# Patient Record
Sex: Female | Born: 1960 | Race: White | Hispanic: No | Marital: Married | Smoking: Former smoker
Health system: Southern US, Community
[De-identification: ages and names within clinical notes are randomized; demographics above are authoritative.]

## PROBLEM LIST (undated history)

## (undated) DIAGNOSIS — E119 Type 2 diabetes mellitus without complications: Secondary | ICD-10-CM

## (undated) DIAGNOSIS — E7800 Pure hypercholesterolemia, unspecified: Secondary | ICD-10-CM

## (undated) DIAGNOSIS — K219 Gastro-esophageal reflux disease without esophagitis: Secondary | ICD-10-CM

## (undated) DIAGNOSIS — Z83719 Family history of colon polyps, unspecified: Secondary | ICD-10-CM

## (undated) DIAGNOSIS — N183 Chronic kidney disease, stage 3 (moderate): Secondary | ICD-10-CM

## (undated) DIAGNOSIS — I10 Essential (primary) hypertension: Secondary | ICD-10-CM

## (undated) HISTORY — DX: Gastro-esophageal reflux disease without esophagitis: K21.9

## (undated) HISTORY — DX: Essential (primary) hypertension: I10

## (undated) HISTORY — DX: Type 2 diabetes mellitus without complications: E11.9

## (undated) HISTORY — PX: APPENDECTOMY: SHX54

## (undated) HISTORY — DX: Chronic kidney disease, stage 3 (moderate): N18.3

## (undated) HISTORY — DX: Family history of colon polyps, unspecified: Z83.719

## (undated) MED FILL — Medication: Fill #0 | Status: CN

---

## 2004-05-29 ENCOUNTER — Encounter: Payer: Self-pay | Admitting: Specialist

## 2005-03-15 ENCOUNTER — Other Ambulatory Visit: Payer: Self-pay

## 2005-03-16 ENCOUNTER — Inpatient Hospital Stay: Payer: Self-pay | Admitting: Internal Medicine

## 2005-04-07 ENCOUNTER — Ambulatory Visit: Payer: Self-pay | Admitting: Internal Medicine

## 2006-04-19 ENCOUNTER — Emergency Department: Payer: Self-pay | Admitting: Unknown Physician Specialty

## 2006-05-22 ENCOUNTER — Other Ambulatory Visit: Payer: Self-pay

## 2006-05-23 ENCOUNTER — Observation Stay: Payer: Self-pay | Admitting: Internal Medicine

## 2006-10-08 ENCOUNTER — Emergency Department: Payer: Self-pay | Admitting: Emergency Medicine

## 2007-04-18 ENCOUNTER — Emergency Department: Payer: Self-pay | Admitting: Emergency Medicine

## 2007-09-25 ENCOUNTER — Observation Stay: Payer: Self-pay | Admitting: Internal Medicine

## 2007-09-25 ENCOUNTER — Other Ambulatory Visit: Payer: Self-pay

## 2007-09-26 ENCOUNTER — Other Ambulatory Visit: Payer: Self-pay

## 2008-04-11 ENCOUNTER — Ambulatory Visit: Payer: Self-pay

## 2008-10-20 ENCOUNTER — Ambulatory Visit: Payer: Self-pay

## 2010-07-09 ENCOUNTER — Emergency Department: Payer: Self-pay | Admitting: Emergency Medicine

## 2013-08-15 LAB — HM DIABETES EYE EXAM

## 2013-09-13 ENCOUNTER — Encounter: Payer: Self-pay | Admitting: Family Medicine

## 2013-09-29 ENCOUNTER — Encounter: Payer: Self-pay | Admitting: Family Medicine

## 2014-03-02 ENCOUNTER — Observation Stay: Payer: Self-pay | Admitting: Internal Medicine

## 2014-03-02 LAB — COMPREHENSIVE METABOLIC PANEL
ALK PHOS: 68 U/L
AST: 17 U/L (ref 15–37)
Albumin: 3.7 g/dL (ref 3.4–5.0)
Anion Gap: 9 (ref 7–16)
BUN: 16 mg/dL (ref 7–18)
Bilirubin,Total: 0.3 mg/dL (ref 0.2–1.0)
CALCIUM: 9.5 mg/dL (ref 8.5–10.1)
CHLORIDE: 105 mmol/L (ref 98–107)
CREATININE: 0.98 mg/dL (ref 0.60–1.30)
Co2: 23 mmol/L (ref 21–32)
EGFR (African American): >60
GLUCOSE: 192 mg/dL — AB (ref 65–99)
Osmolality: 280 (ref 275–301)
POTASSIUM: 3.7 mmol/L (ref 3.5–5.1)
SGPT (ALT): 35 U/L (ref 12–78)
Sodium: 137 mmol/L (ref 136–145)
Total Protein: 7.6 g/dL (ref 6.4–8.2)

## 2014-03-02 LAB — APTT: Activated PTT: 26.8 secs (ref 23.6–35.9)

## 2014-03-02 LAB — PROTIME-INR
INR: 1
Prothrombin Time: 13.1 secs (ref 11.5–14.7)

## 2014-03-02 LAB — CBC
HCT: 34.8 % — AB (ref 35.0–47.0)
HGB: 11.6 g/dL — AB (ref 12.0–16.0)
MCH: 26.3 pg (ref 26.0–34.0)
MCHC: 33.3 g/dL (ref 32.0–36.0)
MCV: 79 fL — ABNORMAL LOW (ref 80–100)
Platelet: 266 10*3/uL (ref 150–440)
RBC: 4.41 10*6/uL (ref 3.80–5.20)
RDW: 17.6 % — AB (ref 11.5–14.5)
WBC: 9.5 10*3/uL (ref 3.6–11.0)

## 2014-03-02 LAB — TROPONIN I: Troponin-I: <0.02 ng/mL

## 2014-03-02 LAB — CK TOTAL AND CKMB (NOT AT ARMC)
CK, Total: 50 U/L
CK-MB: 1.3 ng/mL (ref 0.5–3.6)

## 2014-03-02 LAB — D-DIMER(ARMC): D-Dimer: 313 ng/ml

## 2014-03-03 LAB — HCG, QUANTITATIVE, PREGNANCY: BETA HCG, QUANT.: 2 m[IU]/mL

## 2014-03-03 LAB — CK TOTAL AND CKMB (NOT AT ARMC)
CK, Total: 40 U/L
CK, Total: 36 U/L
CK-MB: 1.1 ng/mL (ref 0.5–3.6)
CK-MB: 1.4 ng/mL (ref 0.5–3.6)

## 2014-03-03 LAB — LIPID PANEL
CHOLESTEROL: 176 mg/dL (ref 0–200)
HDL Cholesterol: 46 mg/dL (ref 40–60)
LDL CHOLESTEROL, CALC: 100 mg/dL (ref 0–100)
Triglycerides: 151 mg/dL (ref 0–200)
VLDL CHOLESTEROL, CALC: 30 mg/dL (ref 5–40)

## 2014-03-03 LAB — TROPONIN I

## 2014-06-16 ENCOUNTER — Ambulatory Visit: Payer: Self-pay | Admitting: Nurse Practitioner

## 2014-12-13 ENCOUNTER — Emergency Department
Admit: 2014-12-13 | Disposition: A | Discharge status: Home / Self Care | Payer: Self-pay | Admitting: Emergency Medicine

## 2014-12-20 NOTE — H&P (Signed)
PATIENT NAME:  Renee Bush, [name] MR#:  [id number] DATE OF BIRTH:  15-Feb-1961  DATE OF ADMISSION:  03/02/2014  REFERRING PHYSICIAN: Dr. Mayford KnifeWilliams. PRIMARY CARE PHYSICIAN: [facility].   CHIEF COMPLAINT: Chest pain.   HISTORY OF PRESENT ILLNESS: A 54 year old Caucasian female with a past medical history of diabetes type 2, hypertension, hyperlipidemia, gastroesophageal reflux disease, who is presenting with chest pain. She describes the chest pain of acute onset which occurred at rest, retrosternal in location, nonradiating, pressure in quality, 4/10 in intensity, associated with nausea. Denies any vomiting, shortness of breath or further symptomatology. At baseline, she denies any anginal symptoms, no worsening factors for her pain. She received 1 nitroglycerin and has relief of symptoms. EKG and cardiac enzymes are within normal limits in the Emergency Department. Currently no further complaints.   REVIEW OF SYSTEMS:  CONSTITUTIONAL: Denies fever, fatigue, weakness, pain.  EYES: Denies blurred vision, double vision, eye pain.  EARS, NOSE, THROAT: Denies tinnitus, ear pain, hearing loss.  RESPIRATORY: Denies cough, wheeze, shortness of breath.  CARDIOVASCULAR: Positive for chest pain as described above. Denies any orthopnea, edema, palpitations.  GASTROINTESTINAL: Positive for nausea. Denies any vomiting, diarrhea, abdominal pain.  GENITOURINARY: Denies dysuria, hematuria.  ENDOCRINE: Denies nocturia or thyroid problems.  HEMATOLOGIC AND LYMPHATIC: Denies easy bruising or bleeding.  SKIN: Denies rashes or lesions.  MUSCULOSKELETAL: Denies pain in neck, back, shoulder, knees, hips or arthritic symptoms.  NEUROLOGIC: Denies paralysis, paresthesias.  PSYCHIATRIC: Denies anxiety or depressive symptoms.  Otherwise, full review of systems performed by me is negative.   PAST MEDICAL HISTORY: Gastroesophageal reflux disease, hypertension, diabetes type 2, hyperlipidemia.   SOCIAL HISTORY:  Remote tobacco use. Occasional alcohol use. Denies any drug usage.   FAMILY HISTORY: Positive for coronary artery disease as well as diabetes in multiple family members.   ALLERGIES: PENICILLIN.   HOME MEDICATIONS: Include: Norco 5/325 mg p.o. q. 4 hours as needed for pain, Ambien 5 mg p.o. at bedtime for sleep, Colace 100 mg p.o. b.i.d., glipizide 5 mg p.o. b.i.d., lisinopril/hydrochlorothiazide 20/12.5 mg p.o. daily, lovastatin 40 mg p.o. daily, milk of magnesia 30 mL p.o. daily for constipation, Mylanta 15 mL p.o. q. 6 hours as needed for indigestion, Nitrostat 0.4 mg sublingual every 5 minutes as needed for chest pain, Protonix 40 mg p.o. daily, Toprol-XL 25 mg p.o. daily, Xanax 0.25 mg p.o. q. 8 hours as needed for anxiety.   PHYSICAL EXAMINATION: VITAL SIGNS: Temperature 98.4, heart rate 93, respirations 20, blood pressure 161/93, saturating at 100% on room air. Weight 77.1 kg, BMI of 31.1.  GENERAL: Well-nourished, well-developed, Caucasian female, currently in no acute distress.  HEENT: Head: Normocephalic, atraumatic. Eyes: Pupils equal, round and reactive to light. Extraocular muscles intact. No scleral icterus. Mouth: Moist mucosal membrane. Dentition intact. No abscess noted. ENT: Clear without exudates. No external lesions.  NECK: Supple. No thyromegaly. No nodules. No JVD.  PULMONARY: Clear to auscultation bilaterally without wheezes, rubs or rhonchi. No use of accessory muscles. Good respiratory effort.  CHEST: Nontender to palpation.  CARDIOVASCULAR: S1, S2, regular rate and rhythm. No murmurs, rubs, or gallops. No edema. Pedal pulses 2+ bilaterally.  GASTROINTESTINAL: Soft, nontender, nondistended. No masses. Positive bowel sounds. No hepatosplenomegaly.  MUSCULOSKELETAL: No swelling, clubbing, or edema. Range of motion full in all extremities.  NEUROLOGIC: Cranial nerves II through XII intact. No gross focal neurologic deficits. Sensation intact. Reflexes intact.  SKIN: No  ulcerations, lesions, rashes, or cyanosis. Skin warm, dry. Turgor intact.  PSYCHIATRIC: Mood and affect  within normal limits. The patient is awake, alert, oriented x3. Insight and judgment intact.   LABORATORY DATA: EKG performed revealing sinus tachycardia, heart rate in the 110s. No ST or T-wave abnormalities. Chest x-ray performed: No acute cardiopulmonary process. Remainder of laboratory data: Sodium 137, potassium 3.7, chloride 105, bicarbonate of 23, BUN 16, creatinine 0.98, glucose 192, LFTs within normal limits. Troponin-I less than 0.02, CK-MB 1.3, CK 50. WBC 9.5, hemoglobin 11.6, platelets of 266,000.   ASSESSMENT AND PLAN: A 55 year old Caucasian female with past medical history of diabetes, hypertension, hyperlipidemia, presenting with chest pain.  1. Chest pain, which occurred at rest. Place on telemetry. Trend cardiac enzymes x 3. Initiate aspirin and statin therapy.  2. Diabetes type 2. Hold p.o. agents. Add insulin sliding scale with Accu-Cheks before meals and at nighttime.  3. Hypertension. Continue hydrochlorothiazide/lisinopril as well as Toprol.  4. Gastroesophageal reflux disease. Continue with proton pump inhibitor therapy.  5. Venous thromboembolism prophylaxis with heparin subcutaneous.   CODE STATUS: The patient is FULL CODE.   TIME SPENT: 45 minutes.    ____________________________ [name]. Hower, MD dkh:lm D: 03/02/2014 23:33:59 ET T: 03/03/2014 01:15:37 ET JOB#: [id number]  cc: [name]. Hower, MD, <Dictator> DAVID Synetta Shadow MD ELECTRONICALLY SIGNED 03/04/2014 1:49

## 2014-12-20 NOTE — Consult Note (Signed)
PATIENT NAME:  Renee Bush, Renee Bush MR#:  [id number] DATE OF BIRTH:  1961/04/05  DATE OF CONSULTATION:  03/03/2014  REFERRING PHYSICIAN:  Dr. Clint GuyHower CONSULTING PHYSICIAN:  Marcina MillardAlexander Demondre Aguas, MD  CHIEF COMPLAINT: Chest pain.   HISTORY OF PRESENT ILLNESS: The patient is a 54 year old female with history of hypertension, hyperlipidemia and diabetes who was admitted on 03/02/2014 with chest pain. The patient reports that she was in her usual state of health until prior to admission she experienced substernal chest discomfort which was rated 8/10, which was pressure-like in quality without radiation, nausea, vomiting or diaphoresis. The patient presented to [facility] Emergency Room where EKG was nondiagnostic. The patient was admitted to telemetry where she has ruled out for myocardial infarction by CPK, isoenzymes and troponin. The patient has had some mild residual chest discomfort which is rated more of 1 to 2/10.   PAST MEDICAL HISTORY: 1.  Hypertension. 2.  Diabetes.  3.  Hyperlipidemia.  4.  Gastroesophageal reflux disease.   MEDICATIONS: Lisinopril/HCTZ 20/12.5 daily, lovastatin 40 mg daily, Toprol-XL 25 mg daily, Norco 5/325 q. 4 hours p.r.n., Ambien 5 mg at bedtime p.r.n., Colace 100 mg b.i.d., glipizide 5 mg b.i.d., milk of magnesia 30 mL daily, Mylanta 15 mL q. 6 p.r.n., Nitrostat p.r.n., Protonix 20 mg daily.   SOCIAL HISTORY: The patient denies tobacco abuse.   FAMILY HISTORY: Positive for coronary artery disease.   REVIEW OF SYSTEMS:  CONSTITUTIONAL: No fever or chills.  EYES: No blurry vision.  EARS: No hearing loss.  RESPIRATORY: No shortness of breath.  CARDIOVASCULAR: Chest discomfort as described above.  GASTROINTESTINAL: No nausea, vomiting, or diarrhea.  GENITOURINARY: No dysuria or hematuria.  ENDOCRINE: No polyuria or polydipsia.  MUSCULOSKELETAL: No arthralgias or myalgias.  NEUROLOGICAL: No focal muscle weakness or numbness.  PSYCHOLOGICAL: No depression or anxiety.    PHYSICAL EXAMINATION: VITAL SIGNS: Blood pressure 121/76, pulse 88, respirations 16, temperature 97.6, pulse ox 94%.  HEENT: Pupils equal and reactive to light and accommodation.  NECK: Supple without thyromegaly.  LUNGS: Clear.  HEART: Normal JVP. Normal PMI. Regular rate and rhythm. Normal S1, S2. No appreciable gallop, murmur, or rub.  ABDOMEN: Soft and nontender. Pulses were intact bilaterally.  MUSCULOSKELETAL: Normal muscle tone.  NEUROLOGIC: The patient is alert and oriented x3. Motor and sensory are both grossly intact.   IMPRESSION: A 54 year old female with multiple cardiovascular risk factors who presents with chest pain, has ruled out for myocardial infarction by CPK, isoenzymes and troponin. EKG is nondiagnostic.   RECOMMENDATIONS: 1.  Agree with overall current therapy.  2.  Would defer full dose anticoagulation.  3.  Would proceed with functional study with ETT Myoview.  4.  Further recommendations pending functional study result.  ____________________________ Marcina MillardAlexander Calisa Luckenbaugh, MD ap:sb D: 03/03/2014 13:28:50 ET T: 03/03/2014 14:53:34 ET JOB#: [id number]  cc: Marcina MillardAlexander Karolee Meloni, MD, <Dictator> Marcina MillardALEXANDER Nailyn Dearinger MD ELECTRONICALLY SIGNED 04/08/2014 13:53

## 2014-12-20 NOTE — Discharge Summary (Signed)
PATIENT NAME:  Renee Bush, [name] MR#:  [id number] DATE OF BIRTH:  1961-07-12  DATE OF ADMISSION:  03/02/2014 DATE OF DISCHARGE:  03/04/2014  ADMITTING PHYSICIAN: Angelica Ranavid Hower, M.D.  DISCHARGING PHYSICIAN: Enid Baasadhika Nickalas Mccarrick, M.D.  PRIMARY CARE PHYSICIAN: [facility].  CONSULTATIONS IN THE HOSPITAL: Cardiology consultation by Dr. Darrold JunkerParaschos.   DISCHARGE DIAGNOSES:  1.  Costochondritis and musculoskeletal chest pain.  2.  Diabetes mellitus.  3.  Hypertension.  4.  Peripheral neuropathy.  5.  Hyperlipidemia.  6.  Gastroesophageal reflux disease.   DISCHARGE HOME MEDICATIONS: 1.  Lisinopril/ hydrochlorothiazide 20/12.5 mg 1 tablet p.o. daily.  2.  Lovastatin 40 mg p.o. daily.  3.  Protonix 40 mg p.o. daily.  4.  Toprol 25 mg p.o. daily.  5.  Ambien 5 mg p.o. at bedtime.  6.  Colace 100 mg p.o. b.i.d.  7.  Sublingual nitroglycerin every 5 minutes p.r.n. for chest drainage.  8.  Xanax 0.25 mg q. 8 hours p.r.n.  9.  Glipizide 5 mg p.o. b.i.d.  10.  Mylanta 15 mL p.o. q. 6 hours p.r.n.  11.  Milk of magnesia 30 mL daily p.r.n. for constipation.  12.  Gabapentin 100 mg p.o. t.i.d.  13.  Norco 5/325 mg 1 tablet q. 6 hours p.r.n. for pain.   DISCHARGE DIET: Low-sodium diet.   DISCHARGE ACTIVITY: As tolerated.    FOLLOWUP INSTRUCTIONS: PCP follow-up in 1-2 weeks.   LABORATORIES AND IMAGING STUDIES PRIOR TO DISCHARGE: Myocardial scan showing no significant wall motion abnormality.  Pharmacological myocardial perfusion study with no significant ischemia, EF is 74%. No EKG changes concerning for ischemia.   LDL cholesterol 100, HDL 46, total cholesterol 811176, triglycerides 151.  Troponins remain negative in the hospital.   Chest x-ray on admission revealing clear lung fields. No acute cardiopulmonary disease.   WBC 9.5, hemoglobin 9.6, hematocrit 34.8, platelet count 266,000.   Sodium 137, potassium 3.7, chloride 105, bicarbonate 23, BUN 16, creatinine 0.98 glucose 192, and  calcium 99.5.   ALT 35, AST 17, alkaline phosphatase 68, total bilirubin 0.3, albumin of 3.7. D-dimer is 313.   BRIEF HOSPITAL COURSE: Renee Bush is a 54 year old Caucasian female with past medical history significant for hypertension, diabetes, and hyperlipidemia who presents to the hospital secondary to chest pain, which is retrosternal, non-radiating, and tender to touch.   1.  Chest pain, likely costochondritis and musculoskeletal.  Pain has improved during the hospital course, though it was not atypical chest pain because of her risk factors for diabetes, hypertension, and her being female gender, possibly atypical presentation of coronary artery disease is also possible. She was ruled out for myocardial infarction with her troponins being negative. However, cardiology was consulted and they recommended Myoview. Myoview came back negative with normal ejection fraction and no changes of ischemia. The patient is being discharged home. She was advised to take Norco or Motrin p.r.n. for her pain.   2.  Peripheral neuropathy.  New diagnosis. The patient has decreased sensation in both feet and pins and needle sensation going on for several weeks now as an outpatient. Being started on low-dose gabapentin at this time and can follow up with PCP as an outpatient.   3.  Diabetes mellitus. Continue home medication. She is on glipizide.  4.  Hypertension. Continue lisinopril/ hydrochlorothiazide and Toprol. Her course has been, otherwise, uneventful in the hospital.   DISCHARGE CONDITION: Stable.   DISCHARGE DISPOSITION: Home.   TIME SPENT ON DISCHARGE: 40 minutes.   ____________________________ Enid Baasadhika Olina Melfi,  MD rk:ts D: 03/04/2014 15:43:56 ET T: 03/04/2014 18:22:42 ET JOB#: [id number]  cc: Enid Baas, MD, <Dictator> [facility] Enid Baas MD ELECTRONICALLY SIGNED 03/06/2014 10:55

## 2015-01-22 ENCOUNTER — Ambulatory Visit: Payer: Self-pay

## 2015-02-12 ENCOUNTER — Ambulatory Visit: Payer: Self-pay | Admitting: Ophthalmology

## 2015-02-19 ENCOUNTER — Ambulatory Visit: Payer: Self-pay | Admitting: Ophthalmology

## 2015-02-19 ENCOUNTER — Ambulatory Visit: Payer: Self-pay

## 2015-02-24 ENCOUNTER — Other Ambulatory Visit: Payer: Self-pay

## 2015-02-26 ENCOUNTER — Ambulatory Visit: Payer: Self-pay

## 2015-02-26 ENCOUNTER — Other Ambulatory Visit: Payer: Self-pay

## 2015-02-26 ENCOUNTER — Ambulatory Visit: Payer: Self-pay | Admitting: Ophthalmology

## 2015-02-26 LAB — HM DIABETES EYE EXAM

## 2015-04-02 ENCOUNTER — Other Ambulatory Visit: Payer: Self-pay

## 2015-04-07 ENCOUNTER — Ambulatory Visit: Payer: Self-pay

## 2015-04-07 LAB — BASIC METABOLIC PANEL
BUN: 19 mg/dL (ref 4–21)
Creatinine: 0.9 mg/dL (ref 0.5–1.1)
Glucose: 163 mg/dL
Potassium: 4.3 mmol/L (ref 3.4–5.3)
SODIUM: 141 mmol/L (ref 137–147)

## 2015-04-07 LAB — CBC AND DIFFERENTIAL
HEMATOCRIT: 39 % (ref 36–46)
HEMOGLOBIN: 13.1 g/dL (ref 12.0–16.0)
Platelets: 243 10*3/uL (ref 150–399)
WBC: 6.8 10^3/mL

## 2015-04-07 LAB — HEPATIC FUNCTION PANEL
ALK PHOS: 58 U/L (ref 25–125)
ALT: 21 U/L (ref 7–35)
AST: 13 U/L (ref 13–35)
BILIRUBIN, TOTAL: 0.2 mg/dL

## 2015-04-08 ENCOUNTER — Ambulatory Visit
Admission: RE | Admit: 2015-04-08 | Discharge: 2015-04-08 | Disposition: A | Discharge status: Home / Self Care | Payer: PRIVATE HEALTH INSURANCE | Source: Ambulatory Visit | Attending: Family Medicine | Admitting: Family Medicine

## 2015-04-08 ENCOUNTER — Other Ambulatory Visit: Payer: Self-pay | Admitting: Family Medicine

## 2015-04-08 DIAGNOSIS — R0989 Other specified symptoms and signs involving the circulatory and respiratory systems: Secondary | ICD-10-CM | POA: Insufficient documentation

## 2015-04-08 DIAGNOSIS — R609 Edema, unspecified: Secondary | ICD-10-CM

## 2015-04-08 DIAGNOSIS — R600 Localized edema: Secondary | ICD-10-CM | POA: Insufficient documentation

## 2015-04-08 DIAGNOSIS — Z880 Allergy status to penicillin: Secondary | ICD-10-CM | POA: Insufficient documentation

## 2015-04-16 ENCOUNTER — Ambulatory Visit: Payer: Self-pay

## 2015-04-18 DIAGNOSIS — R5383 Other fatigue: Secondary | ICD-10-CM | POA: Insufficient documentation

## 2015-04-18 DIAGNOSIS — E119 Type 2 diabetes mellitus without complications: Secondary | ICD-10-CM

## 2015-04-18 DIAGNOSIS — F1010 Alcohol abuse, uncomplicated: Secondary | ICD-10-CM

## 2015-04-18 DIAGNOSIS — I10 Essential (primary) hypertension: Secondary | ICD-10-CM

## 2015-04-18 HISTORY — DX: Alcohol abuse, uncomplicated: F10.10

## 2015-05-07 ENCOUNTER — Other Ambulatory Visit: Payer: Self-pay

## 2015-05-14 ENCOUNTER — Other Ambulatory Visit: Payer: Self-pay

## 2015-05-21 ENCOUNTER — Ambulatory Visit: Payer: Self-pay

## 2015-06-04 ENCOUNTER — Emergency Department: Payer: No Typology Code available for payment source

## 2015-06-04 ENCOUNTER — Emergency Department
Admission: EM | Admit: 2015-06-04 | Discharge: 2015-06-05 | Disposition: A | Discharge status: Home / Self Care | Payer: No Typology Code available for payment source | Attending: Emergency Medicine | Admitting: Emergency Medicine

## 2015-06-04 ENCOUNTER — Other Ambulatory Visit: Payer: Self-pay

## 2015-06-04 ENCOUNTER — Encounter: Payer: Self-pay | Admitting: Emergency Medicine

## 2015-06-04 DIAGNOSIS — S60413A Abrasion of left middle finger, initial encounter: Secondary | ICD-10-CM | POA: Insufficient documentation

## 2015-06-04 DIAGNOSIS — Z880 Allergy status to penicillin: Secondary | ICD-10-CM | POA: Insufficient documentation

## 2015-06-04 DIAGNOSIS — S199XXA Unspecified injury of neck, initial encounter: Secondary | ICD-10-CM | POA: Diagnosis present

## 2015-06-04 DIAGNOSIS — S60222A Contusion of left hand, initial encounter: Secondary | ICD-10-CM

## 2015-06-04 DIAGNOSIS — T07XXXA Unspecified multiple injuries, initial encounter: Secondary | ICD-10-CM

## 2015-06-04 DIAGNOSIS — S60812A Abrasion of left wrist, initial encounter: Secondary | ICD-10-CM | POA: Diagnosis not present

## 2015-06-04 DIAGNOSIS — Y92410 Unspecified street and highway as the place of occurrence of the external cause: Secondary | ICD-10-CM | POA: Diagnosis not present

## 2015-06-04 DIAGNOSIS — Z79899 Other long term (current) drug therapy: Secondary | ICD-10-CM | POA: Insufficient documentation

## 2015-06-04 DIAGNOSIS — Z87891 Personal history of nicotine dependence: Secondary | ICD-10-CM | POA: Diagnosis not present

## 2015-06-04 DIAGNOSIS — S60212A Contusion of left wrist, initial encounter: Secondary | ICD-10-CM | POA: Diagnosis not present

## 2015-06-04 DIAGNOSIS — S161XXA Strain of muscle, fascia and tendon at neck level, initial encounter: Secondary | ICD-10-CM | POA: Diagnosis not present

## 2015-06-04 DIAGNOSIS — E119 Type 2 diabetes mellitus without complications: Secondary | ICD-10-CM | POA: Diagnosis not present

## 2015-06-04 DIAGNOSIS — Y9389 Activity, other specified: Secondary | ICD-10-CM | POA: Insufficient documentation

## 2015-06-04 DIAGNOSIS — I10 Essential (primary) hypertension: Secondary | ICD-10-CM | POA: Insufficient documentation

## 2015-06-04 DIAGNOSIS — Z23 Encounter for immunization: Secondary | ICD-10-CM | POA: Diagnosis not present

## 2015-06-04 DIAGNOSIS — Y998 Other external cause status: Secondary | ICD-10-CM | POA: Insufficient documentation

## 2015-06-04 HISTORY — DX: Pure hypercholesterolemia, unspecified: E78.00

## 2015-06-04 LAB — TYPE AND SCREEN
ABO/RH(D): A POS
Antibody Screen: NEGATIVE

## 2015-06-04 LAB — CBC
HCT: 37.2 % (ref 35.0–47.0)
Hemoglobin: 13 g/dL (ref 12.0–16.0)
MCH: 32.3 pg (ref 26.0–34.0)
MCHC: 35 g/dL (ref 32.0–36.0)
MCV: 92.5 fL (ref 80.0–100.0)
PLATELETS: 170 10*3/uL (ref 150–440)
RBC: 4.02 MIL/uL (ref 3.80–5.20)
RDW: 13.4 % (ref 11.5–14.5)
WBC: 7.5 10*3/uL (ref 3.6–11.0)

## 2015-06-04 LAB — BASIC METABOLIC PANEL
Anion gap: 8 (ref 5–15)
BUN: 22 mg/dL — AB (ref 6–20)
CALCIUM: 8.9 mg/dL (ref 8.9–10.3)
CO2: 29 mmol/L (ref 22–32)
CREATININE: 1.26 mg/dL — AB (ref 0.44–1.00)
Chloride: 102 mmol/L (ref 101–111)
GFR calc Af Amer: 55 mL/min — ABNORMAL LOW (ref >60)
GFR, EST NON AFRICAN AMERICAN: 47 mL/min — AB (ref >60)
Glucose, Bld: 199 mg/dL — ABNORMAL HIGH (ref 65–99)
Potassium: 4.1 mmol/L (ref 3.5–5.1)
SODIUM: 139 mmol/L (ref 135–145)

## 2015-06-04 LAB — PROTIME-INR
INR: 0.94
Prothrombin Time: 12.8 seconds (ref 11.4–15.0)

## 2015-06-04 LAB — APTT: APTT: 28 s (ref 24–36)

## 2015-06-04 MED ORDER — SODIUM CHLORIDE 0.9 % IV BOLUS (SEPSIS)
1000.0000 mL | Freq: Once | INTRAVENOUS | Status: AC
  Administered 2015-06-04: 1000 mL via INTRAVENOUS

## 2015-06-04 MED ORDER — FENTANYL CITRATE (PF) 100 MCG/2ML IJ SOLN
50.0000 ug | Freq: Once | INTRAMUSCULAR | Status: AC
  Administered 2015-06-04: 50 ug via INTRAVENOUS

## 2015-06-04 MED ORDER — PROMETHAZINE HCL 25 MG/ML IJ SOLN
INTRAMUSCULAR | Status: AC
  Administered 2015-06-04: 25 mg via INTRAVENOUS
  Filled 2015-06-04: qty 1

## 2015-06-04 MED ORDER — FENTANYL CITRATE (PF) 100 MCG/2ML IJ SOLN: 100.0000 ug | Freq: Once | INTRAMUSCULAR | Status: AC

## 2015-06-04 MED ORDER — TETANUS-DIPHTH-ACELL PERTUSSIS 5-2.5-18.5 LF-MCG/0.5 IM SUSP
0.5000 mL | Freq: Once | INTRAMUSCULAR | Status: AC
  Administered 2015-06-04: 0.5 mL via INTRAMUSCULAR
  Filled 2015-06-04: qty 0.5

## 2015-06-04 MED ORDER — PROMETHAZINE HCL 25 MG/ML IJ SOLN
25.0000 mg | Freq: Once | INTRAMUSCULAR | Status: AC
  Administered 2015-06-04: 25 mg via INTRAVENOUS

## 2015-06-04 MED ORDER — FENTANYL CITRATE (PF) 100 MCG/2ML IJ SOLN
INTRAMUSCULAR | Status: AC
  Administered 2015-06-04: 50 ug via INTRAVENOUS
  Filled 2015-06-04: qty 2

## 2015-06-04 MED ORDER — HYDROMORPHONE HCL 1 MG/ML IJ SOLN
INTRAMUSCULAR | Status: AC
  Administered 2015-06-04: 1 mg via INTRAVENOUS
  Filled 2015-06-04: qty 1

## 2015-06-04 MED ORDER — ONDANSETRON HCL 4 MG/2ML IJ SOLN
4.0000 mg | Freq: Once | INTRAMUSCULAR | Status: AC
  Administered 2015-06-04: 4 mg via INTRAVENOUS

## 2015-06-04 MED ORDER — ONDANSETRON HCL 4 MG/2ML IJ SOLN
INTRAMUSCULAR | Status: AC
  Administered 2015-06-04: 4 mg via INTRAVENOUS
  Filled 2015-06-04: qty 2

## 2015-06-04 MED ORDER — HYDROMORPHONE HCL 1 MG/ML IJ SOLN
1.0000 mg | Freq: Once | INTRAMUSCULAR | Status: AC
  Administered 2015-06-04: 1 mg via INTRAVENOUS
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
## 2015-06-04 NOTE — ED Notes (Signed)
C-collar removed after reviewing CT results

## 2015-06-04 NOTE — ED Notes (Signed)
Pt dropped sats to the 70's and 80's. Pt sleeping soundly upon RN arrival into room, pt AAOx3, placed on 2L [place]. MD Sharma Covert made aware, MD norman at bedside at this time. Pt sating 99% on 2L at this time, denies any SOB or chest pain.
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
## 2015-06-05 LAB — ABO/RH: ABO/RH(D): A POS

## 2015-06-05 MED ORDER — OXYCODONE-ACETAMINOPHEN 5-325 MG PO TABS: 1.0000 | ORAL_TABLET | ORAL | Status: AC | PRN

## 2015-06-05 MED ORDER — ONDANSETRON HCL 4 MG PO TABS: 4.0000 mg | ORAL_TABLET | Freq: Three times a day (TID) | ORAL | Status: DC | PRN

## 2015-06-05 MED ORDER — IBUPROFEN 800 MG PO TABS: 800.0000 mg | ORAL_TABLET | Freq: Three times a day (TID) | ORAL | Status: DC | PRN
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
## 2015-06-11 ENCOUNTER — Other Ambulatory Visit: Payer: Self-pay

## 2015-06-18 ENCOUNTER — Ambulatory Visit: Payer: Self-pay

## 2015-07-21 ENCOUNTER — Other Ambulatory Visit: Payer: Self-pay

## 2015-07-21 ENCOUNTER — Other Ambulatory Visit: Payer: Self-pay | Admitting: Orthopedic Surgery

## 2015-07-21 DIAGNOSIS — M25532 Pain in left wrist: Secondary | ICD-10-CM

## 2015-08-11 ENCOUNTER — Other Ambulatory Visit: Payer: Self-pay | Admitting: Orthopedic Surgery

## 2015-08-11 ENCOUNTER — Ambulatory Visit
Admission: RE | Admit: 2015-08-11 | Discharge: 2015-08-11 | Disposition: A | Discharge status: Home / Self Care | Payer: PRIVATE HEALTH INSURANCE | Source: Ambulatory Visit | Attending: Orthopedic Surgery | Admitting: Orthopedic Surgery

## 2015-08-11 DIAGNOSIS — M659 Synovitis and tenosynovitis, unspecified: Secondary | ICD-10-CM | POA: Insufficient documentation

## 2015-08-11 DIAGNOSIS — S63592A Other specified sprain of left wrist, initial encounter: Secondary | ICD-10-CM | POA: Insufficient documentation

## 2015-08-11 DIAGNOSIS — R531 Weakness: Secondary | ICD-10-CM | POA: Insufficient documentation

## 2015-08-11 DIAGNOSIS — M25532 Pain in left wrist: Secondary | ICD-10-CM | POA: Insufficient documentation

## 2015-08-20 ENCOUNTER — Other Ambulatory Visit: Payer: Self-pay

## 2015-09-03 ENCOUNTER — Ambulatory Visit: Payer: Self-pay | Admitting: Ophthalmology

## 2015-09-03 ENCOUNTER — Other Ambulatory Visit: Payer: Self-pay

## 2015-09-04 LAB — LIPID PANEL
Cholesterol: 154 mg/dL (ref 0–200)
HDL: 48 mg/dL (ref 35–70)
LDL CALC: 84 mg/dL
Triglycerides: 111 mg/dL (ref 40–160)

## 2015-09-04 LAB — HEMOGLOBIN A1C: HEMOGLOBIN A1C: 7.5

## 2015-09-04 LAB — BASIC METABOLIC PANEL
BUN: 21 mg/dL (ref 4–21)
CREATININE: 1.1 mg/dL (ref 0.5–1.1)
GLUCOSE: 203 mg/dL
Potassium: 4.3 mmol/L (ref 3.4–5.3)
SODIUM: 141 mmol/L (ref 137–147)

## 2015-09-04 LAB — HEPATIC FUNCTION PANEL
ALT: 18 U/L (ref 7–35)
AST: 11 U/L — AB (ref 13–35)
Alkaline Phosphatase: 55 U/L (ref 25–125)
Bilirubin, Total: 0.2 mg/dL

## 2015-09-10 ENCOUNTER — Ambulatory Visit: Payer: Self-pay

## 2015-09-10 DIAGNOSIS — I10 Essential (primary) hypertension: Secondary | ICD-10-CM

## 2015-09-10 DIAGNOSIS — E1140 Type 2 diabetes mellitus with diabetic neuropathy, unspecified: Secondary | ICD-10-CM | POA: Insufficient documentation

## 2015-09-10 DIAGNOSIS — Z794 Long term (current) use of insulin: Secondary | ICD-10-CM

## 2015-09-10 HISTORY — DX: Essential (primary) hypertension: I10

## 2015-10-15 ENCOUNTER — Other Ambulatory Visit: Payer: Self-pay

## 2015-11-10 ENCOUNTER — Other Ambulatory Visit: Payer: Self-pay

## 2015-11-10 DIAGNOSIS — K219 Gastro-esophageal reflux disease without esophagitis: Secondary | ICD-10-CM

## 2015-11-10 DIAGNOSIS — I152 Hypertension secondary to endocrine disorders: Secondary | ICD-10-CM

## 2015-11-10 DIAGNOSIS — E1159 Type 2 diabetes mellitus with other circulatory complications: Secondary | ICD-10-CM

## 2015-11-11 ENCOUNTER — Other Ambulatory Visit: Payer: Self-pay

## 2015-11-11 NOTE — Telephone Encounter (Signed)
Could not reorder ompeprazol due to  The order you are attempting to reorder has a previously pended reorder. Do you still want to reorder?      omeprazole (PRILOSEC) 20 MG capsule (Order [id number]) was reordered and pended by Harle BattiestShannon A McGowan, PA-C     Active Order  Pended Reorder    omeprazole (PRILOSEC) 20 MG capsule (Order [id number]) omeprazole (PRILOSEC) 20 MG capsule (Order [id number])   Start: (none) Start: 11/10/2015   End: (none) End: (none)   Sig: Take 20 mg by mouth 2 (two) times daily. Sig: Take 1 capsule (20 mg total) by mouth 2 (two) times daily.   Route: Oral Route: Oral   Class: Historical Med Class:    Provider Info     Ordering User: Elenora GammaLorrie H Holt Ordering User: Lelon MastLorrie Holt   Ordering Provider:  Ordering Provider: Harle BattiestShannon A McGowan, PA-C   Authorizing Provider: Historical Provider, MD Authorizing Provider: Harle BattiestShannon A McGowan, PA-C   PCP:  PCP: No Pcp Per Patient

## 2015-11-11 NOTE — Telephone Encounter (Signed)
Received fax from [facility] to refill omeprazole 20 mg

## 2015-11-12 ENCOUNTER — Telehealth: Payer: Self-pay

## 2015-11-12 ENCOUNTER — Other Ambulatory Visit: Payer: Self-pay

## 2015-11-12 DIAGNOSIS — K219 Gastro-esophageal reflux disease without esophagitis: Secondary | ICD-10-CM

## 2015-11-12 DIAGNOSIS — D649 Anemia, unspecified: Secondary | ICD-10-CM

## 2015-11-12 DIAGNOSIS — Z794 Long term (current) use of insulin: Principal | ICD-10-CM

## 2015-11-12 DIAGNOSIS — E1140 Type 2 diabetes mellitus with diabetic neuropathy, unspecified: Secondary | ICD-10-CM

## 2015-11-12 DIAGNOSIS — I10 Essential (primary) hypertension: Secondary | ICD-10-CM

## 2015-11-12 MED ORDER — CYANOCOBALAMIN 1000 MCG/ML IJ SOLN
1000.0000 ug | Freq: Once | INTRAMUSCULAR | Status: AC
  Administered 2015-11-12: 1000 ug via INTRAMUSCULAR

## 2015-11-12 NOTE — Telephone Encounter (Signed)
Patient is out of medications reroute to refill pool.

## 2015-11-13 NOTE — Telephone Encounter (Signed)
Needs refills on medications. 

## 2015-11-13 NOTE — Telephone Encounter (Signed)
Patient needs refill on medications. 

## 2015-11-18 ENCOUNTER — Other Ambulatory Visit: Payer: Self-pay

## 2015-11-18 NOTE — Telephone Encounter (Signed)
Received fax from [facility] to refill omeprazole

## 2015-11-19 ENCOUNTER — Other Ambulatory Visit: Payer: Self-pay | Admitting: Urology

## 2015-11-19 DIAGNOSIS — G629 Polyneuropathy, unspecified: Secondary | ICD-10-CM

## 2015-11-19 MED ORDER — OMEPRAZOLE 20 MG PO CPDR: 20.0000 mg | DELAYED_RELEASE_CAPSULE | Freq: Two times a day (BID) | ORAL | Status: DC

## 2015-11-19 MED ORDER — LISINOPRIL 10 MG PO TABS: 10.0000 mg | ORAL_TABLET | Freq: Every day | ORAL | Status: DC

## 2015-11-19 MED ORDER — GABAPENTIN 100 MG PO CAPS: 200.0000 mg | ORAL_CAPSULE | Freq: Three times a day (TID) | ORAL | Status: DC

## 2015-11-23 NOTE — Telephone Encounter (Signed)
This encounter was created in error - please disregard.  This encounter was created in error - please disregard.

## 2015-12-03 ENCOUNTER — Other Ambulatory Visit: Payer: Self-pay

## 2015-12-03 DIAGNOSIS — E119 Type 2 diabetes mellitus without complications: Secondary | ICD-10-CM

## 2015-12-03 DIAGNOSIS — E611 Iron deficiency: Secondary | ICD-10-CM

## 2015-12-04 LAB — COMPREHENSIVE METABOLIC PANEL
A/G RATIO: 1.7 (ref 1.2–2.2)
ALK PHOS: 59 IU/L (ref 39–117)
ALT: 20 IU/L (ref 0–32)
AST: 15 IU/L (ref 0–40)
Albumin: 4.1 g/dL (ref 3.5–5.5)
BILIRUBIN TOTAL: 0.2 mg/dL (ref 0.0–1.2)
BUN/Creatinine Ratio: 20 (ref 9–23)
BUN: 21 mg/dL (ref 6–24)
CHLORIDE: 96 mmol/L (ref 96–106)
CO2: 29 mmol/L (ref 18–29)
Calcium: 9.4 mg/dL (ref 8.7–10.2)
Creatinine, Ser: 1.06 mg/dL — ABNORMAL HIGH (ref 0.57–1.00)
GFR calc Af Amer: 68 mL/min/{1.73_m2} (ref >59)
GFR, EST NON AFRICAN AMERICAN: 59 mL/min/{1.73_m2} — AB (ref >59)
GLOBULIN, TOTAL: 2.4 g/dL (ref 1.5–4.5)
Glucose: 111 mg/dL — ABNORMAL HIGH (ref 65–99)
POTASSIUM: 4.9 mmol/L (ref 3.5–5.2)
SODIUM: 141 mmol/L (ref 134–144)
Total Protein: 6.5 g/dL (ref 6.0–8.5)

## 2015-12-04 LAB — HEMOGLOBIN A1C
ESTIMATED AVERAGE GLUCOSE: 217 mg/dL
HEMOGLOBIN A1C: 9.2 % — AB (ref 4.8–5.6)

## 2015-12-10 ENCOUNTER — Ambulatory Visit: Payer: Self-pay

## 2015-12-10 VITALS — BP 151/94 | HR 123 | Wt 184.0 lb

## 2015-12-10 DIAGNOSIS — Z0130 Encounter for examination of blood pressure without abnormal findings: Secondary | ICD-10-CM

## 2015-12-15 ENCOUNTER — Ambulatory Visit: Payer: Self-pay

## 2015-12-17 ENCOUNTER — Other Ambulatory Visit: Payer: Self-pay

## 2015-12-17 DIAGNOSIS — E611 Iron deficiency: Secondary | ICD-10-CM

## 2015-12-17 MED ORDER — CYANOCOBALAMIN 1000 MCG/ML IJ SOLN
1000.0000 ug | Freq: Once | INTRAMUSCULAR | Status: AC
  Administered 2015-12-17: 1000 ug via INTRAMUSCULAR

## 2016-01-19 MED ORDER — CYANOCOBALAMIN 1000 MCG/ML IJ SOLN
1000.0000 ug | Freq: Once | INTRAMUSCULAR | Status: AC
  Administered 2016-02-18: 1000 ug via INTRAMUSCULAR

## 2016-01-21 ENCOUNTER — Other Ambulatory Visit: Payer: Self-pay

## 2016-01-21 DIAGNOSIS — R5383 Other fatigue: Secondary | ICD-10-CM

## 2016-01-21 MED ORDER — CYANOCOBALAMIN 1000 MCG/ML IJ SOLN: 1000.0000 ug | Freq: Once | INTRAMUSCULAR | Status: DC

## 2016-02-18 ENCOUNTER — Ambulatory Visit: Payer: Self-pay | Admitting: Nurse Practitioner

## 2016-02-18 VITALS — BP 116/77 | HR 99 | Ht 62.5 in | Wt 182.0 lb

## 2016-02-18 DIAGNOSIS — I10 Essential (primary) hypertension: Secondary | ICD-10-CM

## 2016-02-18 DIAGNOSIS — E119 Type 2 diabetes mellitus without complications: Secondary | ICD-10-CM

## 2016-02-18 DIAGNOSIS — E782 Mixed hyperlipidemia: Secondary | ICD-10-CM

## 2016-02-18 DIAGNOSIS — G629 Polyneuropathy, unspecified: Secondary | ICD-10-CM

## 2016-02-18 LAB — GLUCOSE, POCT (MANUAL RESULT ENTRY): POC GLUCOSE: 218 mg/dL — AB (ref 70–99)

## 2016-02-18 MED ORDER — ROSUVASTATIN CALCIUM 5 MG PO TABS: 5.0000 mg | ORAL_TABLET | Freq: Every day | ORAL | Status: DC

## 2016-02-18 MED ORDER — GABAPENTIN 100 MG PO CAPS: ORAL_CAPSULE | ORAL | Status: DC

## 2016-02-18 MED ORDER — FUROSEMIDE 20 MG PO TABS: 20.0000 mg | ORAL_TABLET | Freq: Every day | ORAL | Status: DC

## 2016-02-18 MED ORDER — CYANOCOBALAMIN 1000 MCG/ML IJ SOLN: 1000.0000 ug | Freq: Once | INTRAMUSCULAR | Status: DC

## 2016-02-18 MED ORDER — INSULIN ASPART PROT & ASPART (70-30 MIX) 100 UNIT/ML ~~LOC~~ SUSP: 9.0000 [IU] | Freq: Two times a day (BID) | SUBCUTANEOUS | Status: DC

## 2016-02-18 MED ORDER — OMEPRAZOLE 20 MG PO CPDR: 20.0000 mg | DELAYED_RELEASE_CAPSULE | Freq: Two times a day (BID) | ORAL | Status: DC

## 2016-02-18 MED ORDER — METFORMIN HCL 1000 MG PO TABS: 1000.0000 mg | ORAL_TABLET | Freq: Two times a day (BID) | ORAL | Status: DC

## 2016-02-18 MED ORDER — NORTRIPTYLINE HCL 10 MG PO CAPS: ORAL_CAPSULE | ORAL | Status: DC

## 2016-02-18 MED ORDER — NAPROXEN 500 MG PO TABS: 500.0000 mg | ORAL_TABLET | Freq: Two times a day (BID) | ORAL | Status: DC

## 2016-02-18 NOTE — Progress Notes (Signed)
   Subjective:    Patient ID: Renee Bush, female    DOB: [birth year], 55 y.o.   MRN: [id number]  HPI IS HAVING SIGNIFICANT NEUROPATHY, IS TAKING 200 MG OF NEURONTIN TID  LAST A1C WAS .9, NOT CHECKING HER BLOOD SUGARS, ON ANY REGULAR BASIS IS ONLY TAKING 6 UNITS OF NOVOLOG 70/30 BID, ALONG WITH HER OTHER DIABETES MEDS IS HAVING SIGNIFICANT PAIN, ACETAMINOPHEN AND IBUPROFEN NOT EFFECTIVE.    HAS DIFFICULTY WITH SLEEPING, NO HISTORY OF SNORING OR SLEEP APNEA.   Review of Systems   SEE HPI    Objective:   Physical Exam  ALERT  VERBALLY APPROPRIATE, IN NO ACUTE DISTRESS,  0 CAROTID BRUITS NOT PRESENT NO PALPABLE ADENOPATHY AP RAPID BASELINE FOR THIS PAT.    NO BLE EDEMA      Assessment & Plan:  INSOMNIA, WILL ADD NOTRIPTYLINE 10 MG 1-2 AT BEDTIME,   PAIN RELIEF, WILL ADD NAPROXEN AT 5OO MG WITH FOOD, BID AS NEEDED.    POORLY CONTROLLED BLOOD SUGARS, WILL INCREASE NOVOLOG 70/30, TO 9 UNITS BID.    WILL INCREASE GABAPENTIN TO 900 MG BID AS IT DOES NOT MAKE PAT SLEEPY WILL PLAN FOR LABS IN JULY   WILL CHECK BMP SOONER IF POSSIBLE..Marland Kitchen

## 2016-02-18 NOTE — Addendum Note (Signed)
Addended by: Orvis BrillGREEN, Nihaal Friesen M on: 02/18/2016 08:28 PM   Modules accepted: Orders

## 2016-03-24 ENCOUNTER — Other Ambulatory Visit: Payer: Self-pay

## 2016-03-24 DIAGNOSIS — I10 Essential (primary) hypertension: Secondary | ICD-10-CM

## 2016-03-24 DIAGNOSIS — E119 Type 2 diabetes mellitus without complications: Secondary | ICD-10-CM

## 2016-03-24 DIAGNOSIS — E782 Mixed hyperlipidemia: Secondary | ICD-10-CM

## 2016-03-25 LAB — SPECIMEN STATUS REPORT

## 2016-03-26 LAB — LIPID PANEL
CHOL/HDL RATIO: 3.8 ratio (ref 0.0–4.4)
CHOLESTEROL TOTAL: 141 mg/dL (ref 100–199)
HDL: 37 mg/dL — ABNORMAL LOW (ref >39)
LDL CALC: 73 mg/dL (ref 0–99)
Triglycerides: 157 mg/dL — ABNORMAL HIGH (ref 0–149)
VLDL CHOLESTEROL CAL: 31 mg/dL (ref 5–40)

## 2016-03-26 LAB — HEMOGLOBIN A1C
ESTIMATED AVERAGE GLUCOSE: 275 mg/dL
HEMOGLOBIN A1C: 11.2 % — AB (ref 4.8–5.6)

## 2016-03-31 ENCOUNTER — Ambulatory Visit: Payer: Self-pay | Admitting: Urology

## 2016-03-31 VITALS — BP 134/86 | HR 114 | Wt 184.0 lb

## 2016-03-31 DIAGNOSIS — Z794 Long term (current) use of insulin: Principal | ICD-10-CM

## 2016-03-31 DIAGNOSIS — G629 Polyneuropathy, unspecified: Secondary | ICD-10-CM

## 2016-03-31 DIAGNOSIS — E1140 Type 2 diabetes mellitus with diabetic neuropathy, unspecified: Secondary | ICD-10-CM

## 2016-03-31 LAB — GLUCOSE, POCT (MANUAL RESULT ENTRY): POC GLUCOSE: 160 mg/dL — AB (ref 70–99)

## 2016-03-31 MED ORDER — GABAPENTIN 400 MG PO CAPS: 400.0000 mg | ORAL_CAPSULE | Freq: Three times a day (TID) | ORAL | 0 refills | Status: DC

## 2016-03-31 MED ORDER — NORTRIPTYLINE HCL 25 MG PO CAPS: 25.0000 mg | ORAL_CAPSULE | Freq: Every day | ORAL | 3 refills | Status: DC

## 2016-03-31 NOTE — Progress Notes (Signed)
   Subjective:    Patient ID: Renee Bush, female    DOB: 1960/10/06, 55 y.o.   MRN: [id number]  HPI IS HAVING SIGNIFICANT NEUROPATHY, IS TAKING 300 MG OF NEURONTIN TID  Started nortriptyline 10 mg, 2 tablets at night  LAST A1C WAS 11.2%  BS was 120 yesterday, checks them once a day  TAKING 9 UNITS OF NOVOLOG 70/30 BID,   HAVING SIGNIFICANT PAIN gabapentin AND nortriptyline NOT EFFECTIVE.    Pain starts in the feet and moves up the leg  HAS DIFFICULTY WITH SLEEPING, NO HISTORY OF SNORING OR SLEEP APNEA.   Review of Systems   SEE HPI    Results for orders placed or performed in visit on 03/24/16  Lipid Profile  Result Value Ref Range   Cholesterol, Total 141 100 - 199 mg/dL   Triglycerides 852 (H) 0 - 149 mg/dL   HDL 37 (L) >77 mg/dL   VLDL Cholesterol Cal 31 5 - 40 mg/dL   LDL Calculated 73 0 - 99 mg/dL   Chol/HDL Ratio 3.8 0.0 - 4.4 ratio units  HgB A1c  Result Value Ref Range   Hgb A1c MFr Bld 11.2 (H) 4.8 - 5.6 %   Est. average glucose Bld gHb Est-mCnc 275 mg/dL  Specimen status report  Result Value Ref Range   specimen status report Comment    Objective:   Physical Exam  ALERT  VERBALLY APPROPRIATE, IN NO ACUTE DISTRESS,  0 CAROTID BRUITS NOT PRESENT NO PALPABLE ADENOPATHY AP RAPID BASELINE FOR THIS PAT.    NO BLE EDEMA      Assessment & Plan:  INSOMNIA, WILL increase NOTRIPTYLINE to 25 mg 1-2 AT BEDTIME,   PAIN RELIEF, WILL ADD NAPROXEN AT 5OO MG WITH FOOD, BID AS NEEDED.    POORLY CONTROLLED BLOOD SUGARS, WILL INCREASE NOVOLOG 70/30, TO 9 UNITS BID.  She will now check sugars bid  WILL INCREASE GABAPENTIN TO 400 MG tid .   CMP drawn tonight

## 2016-04-01 LAB — COMPREHENSIVE METABOLIC PANEL
ALT: 20 IU/L (ref 0–32)
AST: 11 IU/L (ref 0–40)
Albumin/Globulin Ratio: 2 (ref 1.2–2.2)
Albumin: 4.2 g/dL (ref 3.5–5.5)
Alkaline Phosphatase: 58 IU/L (ref 39–117)
BUN/Creatinine Ratio: 23 (ref 9–23)
BUN: 20 mg/dL (ref 6–24)
Bilirubin Total: <0.2 mg/dL (ref 0.0–1.2)
CALCIUM: 9.2 mg/dL (ref 8.7–10.2)
CO2: 25 mmol/L (ref 18–29)
CREATININE: 0.86 mg/dL (ref 0.57–1.00)
Chloride: 98 mmol/L (ref 96–106)
GFR calc Af Amer: 88 mL/min/{1.73_m2} (ref >59)
GFR, EST NON AFRICAN AMERICAN: 76 mL/min/{1.73_m2} (ref >59)
GLUCOSE: 229 mg/dL — AB (ref 65–99)
Globulin, Total: 2.1 g/dL (ref 1.5–4.5)
POTASSIUM: 4.2 mmol/L (ref 3.5–5.2)
Sodium: 141 mmol/L (ref 134–144)
Total Protein: 6.3 g/dL (ref 6.0–8.5)

## 2016-04-07 ENCOUNTER — Other Ambulatory Visit: Payer: Self-pay

## 2016-05-05 ENCOUNTER — Ambulatory Visit: Payer: Self-pay | Admitting: Family Medicine

## 2016-05-05 ENCOUNTER — Encounter: Payer: Self-pay | Admitting: Family Medicine

## 2016-05-05 VITALS — BP 137/94 | HR 107 | Resp 16 | Ht 62.0 in | Wt 185.0 lb

## 2016-05-05 DIAGNOSIS — E1140 Type 2 diabetes mellitus with diabetic neuropathy, unspecified: Secondary | ICD-10-CM

## 2016-05-05 DIAGNOSIS — E785 Hyperlipidemia, unspecified: Secondary | ICD-10-CM | POA: Insufficient documentation

## 2016-05-05 DIAGNOSIS — Z794 Long term (current) use of insulin: Secondary | ICD-10-CM

## 2016-05-05 DIAGNOSIS — E1169 Type 2 diabetes mellitus with other specified complication: Secondary | ICD-10-CM | POA: Insufficient documentation

## 2016-05-05 DIAGNOSIS — E119 Type 2 diabetes mellitus without complications: Secondary | ICD-10-CM

## 2016-05-05 DIAGNOSIS — E7800 Pure hypercholesterolemia, unspecified: Secondary | ICD-10-CM

## 2016-05-05 DIAGNOSIS — F1010 Alcohol abuse, uncomplicated: Secondary | ICD-10-CM

## 2016-05-05 LAB — GLUCOSE, POCT (MANUAL RESULT ENTRY): POC GLUCOSE: 412 mg/dL — AB (ref 70–99)

## 2016-05-05 MED ORDER — METOPROLOL TARTRATE 50 MG PO TABS: 50.0000 mg | ORAL_TABLET | Freq: Every day | ORAL | 7 refills | Status: DC

## 2016-05-05 MED ORDER — CYANOCOBALAMIN 1000 MCG/ML IJ SOLN: 1000.0000 ug | Freq: Once | INTRAMUSCULAR | 0 refills | Status: AC

## 2016-05-05 MED ORDER — GABAPENTIN 400 MG PO CAPS: 400.0000 mg | ORAL_CAPSULE | Freq: Three times a day (TID) | ORAL | 7 refills | Status: DC

## 2016-05-05 MED ORDER — METFORMIN HCL 1000 MG PO TABS: 1000.0000 mg | ORAL_TABLET | Freq: Two times a day (BID) | ORAL | 1 refills | Status: DC

## 2016-05-05 MED ORDER — CYANOCOBALAMIN 1000 MCG/ML IJ SOLN
1000.0000 ug | Freq: Once | INTRAMUSCULAR | Status: AC
  Administered 2016-05-05: 1000 ug via INTRAMUSCULAR

## 2016-05-05 MED ORDER — ROSUVASTATIN CALCIUM 5 MG PO TABS: 5.0000 mg | ORAL_TABLET | Freq: Every day | ORAL | 7 refills | Status: DC

## 2016-05-05 NOTE — Progress Notes (Signed)
BP (!) 137/94   Pulse (!) 107   Resp 16   Ht 5\' 2"  (1.575 m)   Wt 185 lb (83.9 kg)   SpO2 98%   BMI 33.84 kg/m    Subjective:    Patient ID: Renee Bush, female    DOB: 31-Dec-1960, 55 y.o.   MRN: [id number]  HPI: Renee GuadeloupeCatherine C Shore is a 55 y.o. female  Chief Complaint  Patient presents with  . Follow-up   Follow-up diabetes patient doing poorly with elevated blood sugars has not been adjusting insulin except for one units up to 10 units twice a day. Still having markedly elevated glucoses Patient also with worsening neuropathy Blood pressure doing okay Relevant past medical, surgical, family and social history reviewed and updated as indicated. Interim medical history since our last visit reviewed. Allergies and medications reviewed and updated.  Review of Systems  Respiratory: Negative.   Cardiovascular: Negative.     Per HPI unless specifically indicated above     Objective:    BP (!) 137/94   Pulse (!) 107   Resp 16   Ht 5\' 2"  (1.575 m)   Wt 185 lb (83.9 kg)   SpO2 98%   BMI 33.84 kg/m   Wt Readings from Last 3 Encounters:  05/05/16 185 lb (83.9 kg)  03/31/16 184 lb (83.5 kg)  02/18/16 182 lb (82.6 kg)    Physical Exam  Constitutional: She is oriented to person, place, and time. She appears well-developed and well-nourished. No distress.  HENT:  Head: Normocephalic and atraumatic.  Right Ear: Hearing normal.  Left Ear: Hearing normal.  Nose: Nose normal.  Eyes: Conjunctivae and lids are normal. Right eye exhibits no discharge. Left eye exhibits no discharge. No scleral icterus.  Cardiovascular: Normal rate, regular rhythm and normal heart sounds.   Pulmonary/Chest: Effort normal and breath sounds normal. No respiratory distress.  Musculoskeletal: Normal range of motion.  Neurological: She is alert and oriented to person, place, and time.  Skin: Skin is intact. No rash noted.  Psychiatric: She has a normal mood and affect. Her speech is  normal and behavior is normal. Judgment and thought content normal. Cognition and memory are normal.    Results for orders placed or performed in visit on 05/05/16  POCT Glucose (CBG)  Result Value Ref Range   POC Glucose 412 (A) 70 - 99 mg/dl      Assessment & Plan:   Problem List Items Addressed This Visit      Endocrine   Type 2 diabetes mellitus with diabetic neuropathy (HCC) - Primary    Discussed diabetes and insulin dosing and limitation due to finances. Because of limitations with finances will not use 24-hour insulin and continue 7030 insulin. Discussed dosing with simplified dosing of increasing for high glucose decreasing forlow glucose Patient will start with increasing by 5 units until glucose around 200 then go to 2 units adjustments      Relevant Medications   rosuvastatin (CRESTOR) 5 MG tablet   metFORMIN (GLUCOPHAGE) 1000 MG tablet     Other   ETOH abuse    Doing much better only drinks very rarely in small amounts.      Hypercholesteremia    The current medical regimen is effective;  continue present plan and medications.       Relevant Medications   rosuvastatin (CRESTOR) 5 MG tablet   metoprolol (LOPRESSOR) 50 MG tablet    Other Visit Diagnoses   None.  Follow up plan: Return in about 4 weeks (around 06/02/2016) for Hemoglobin A1c.

## 2016-05-05 NOTE — Assessment & Plan Note (Signed)
Doing much better only drinks very rarely in small amounts.

## 2016-05-05 NOTE — Addendum Note (Signed)
Addended by: Milana NaSHORE, Abdalrahman Clementson E on: 05/05/2016 06:54 PM   Modules accepted: Orders

## 2016-05-05 NOTE — Assessment & Plan Note (Signed)
Discussed diabetes and insulin dosing and limitation due to finances. Because of limitations with finances will not use 24-hour insulin and continue 7030 insulin. Discussed dosing with simplified dosing of increasing for high glucose decreasing forlow glucose Patient will start with increasing by 5 units until glucose around 200 then go to 2 units adjustments

## 2016-05-05 NOTE — Progress Notes (Signed)
B12 injection given in right deltoid. Pt tolerated well. Apt made for next injection.

## 2016-05-05 NOTE — Assessment & Plan Note (Signed)
The current medical regimen is effective;  continue present plan and medications.  

## 2016-06-02 ENCOUNTER — Other Ambulatory Visit: Payer: Self-pay

## 2016-06-02 DIAGNOSIS — E109 Type 1 diabetes mellitus without complications: Secondary | ICD-10-CM

## 2016-06-03 LAB — HEMOGLOBIN A1C
ESTIMATED AVERAGE GLUCOSE: 240 mg/dL
HEMOGLOBIN A1C: 10 % — AB (ref 4.8–5.6)

## 2016-06-09 ENCOUNTER — Ambulatory Visit: Payer: Self-pay | Admitting: Adult Health Nurse Practitioner

## 2016-06-09 VITALS — BP 139/92 | HR 108 | Temp 98.2°F | Resp 16 | Ht 62.0 in | Wt 181.0 lb

## 2016-06-09 DIAGNOSIS — E119 Type 2 diabetes mellitus without complications: Secondary | ICD-10-CM

## 2016-06-09 DIAGNOSIS — E538 Deficiency of other specified B group vitamins: Secondary | ICD-10-CM | POA: Insufficient documentation

## 2016-06-09 LAB — GLUCOSE, POCT (MANUAL RESULT ENTRY): POC GLUCOSE: 202 mg/dL — AB (ref 70–99)

## 2016-06-09 MED ORDER — CYANOCOBALAMIN 1000 MCG/ML IJ SOLN: 1000.0000 ug | Freq: Once | INTRAMUSCULAR | Status: DC

## 2016-06-09 MED ORDER — DULAGLUTIDE 0.75 MG/0.5ML ~~LOC~~ SOAJ: 0.7500 mg | SUBCUTANEOUS | 4 refills | Status: DC

## 2016-06-09 MED ORDER — FUROSEMIDE 20 MG PO TABS: 20.0000 mg | ORAL_TABLET | Freq: Every day | ORAL | 1 refills | Status: DC

## 2016-06-09 MED ORDER — NORTRIPTYLINE HCL 25 MG PO CAPS: 25.0000 mg | ORAL_CAPSULE | Freq: Every day | ORAL | 3 refills | Status: DC

## 2016-06-09 NOTE — Progress Notes (Signed)
Patient: Renee GuadeloupeCatherine C Hopwood Female    DOB: 09-14-1960   55 y.o.   MRN: [id number] Visit Date: 06/09/2016  Today's Provider: [facility]   Chief Complaint  Patient presents with  . Follow-up   Subjective:    HPI   DM: Taking 40 units of 70/30 BID.  Metformin BID.  Trying to monitor diet.  Babysitting during the day- Reports that she gets out in the yard and walks a couple times in the afternoon.  CBGs at home 160-280. Checking sugars every other day.  Weight down 4 lbs from last visit.  A1C down to 10 from 11.2 on last check.    Needs B12 injection today.    Allergies  Allergen Reactions  . Etodolac Rash  . Penicillin G Rash  . Penicillins Rash   Previous Medications   ASPIRIN 81 MG TABLET    Take 81 mg by mouth daily.   FUROSEMIDE (LASIX) 20 MG TABLET    Take 1 tablet (20 mg total) by mouth daily.   GABAPENTIN (NEURONTIN) 400 MG CAPSULE    Take 1 capsule (400 mg total) by mouth 3 (three) times daily.   INSULIN ASPART PROTAMINE- ASPART (NOVOLOG MIX 70/30) (70-30) 100 UNIT/ML INJECTION    Inject 0.09 mLs (9 Units total) into the skin 2 (two) times daily with a meal.   LISINOPRIL (PRINIVIL,ZESTRIL) 10 MG TABLET    Take 1 tablet (10 mg total) by mouth daily.   METFORMIN (GLUCOPHAGE) 1000 MG TABLET    Take 1 tablet (1,000 mg total) by mouth 2 (two) times daily with a meal.   METOPROLOL (LOPRESSOR) 50 MG TABLET    Take 1 tablet (50 mg total) by mouth daily.   NAPROXEN (NAPROSYN) 500 MG TABLET    Take 1 tablet (500 mg total) by mouth 2 (two) times daily with a meal.   NITROGLYCERIN (NITROSTAT) 0.4 MG SL TABLET    Place 0.4 mg under the tongue every 5 (five) minutes as needed for chest pain.   NORTRIPTYLINE (PAMELOR) 25 MG CAPSULE    Take 1 capsule (25 mg total) by mouth at bedtime. 1 to 2 capsules by mouth at bedtime   OMEGA-3 FATTY ACIDS (FISH OIL) 1000 MG CAPS    Take 1,000 mg by mouth 2 (two) times daily.   OMEPRAZOLE (PRILOSEC) 20 MG CAPSULE    Take 1 capsule  (20 mg total) by mouth 2 (two) times daily.   ROSUVASTATIN (CRESTOR) 5 MG TABLET    Take 1 tablet (5 mg total) by mouth daily.    Review of Systems  All other systems reviewed and are negative.   Social History  Substance Use Topics  . Smoking status: Former Smoker    Quit date: 04/17/2005  . Smokeless tobacco: Not on file  . Alcohol use 8.4 oz/week    14 Cans of beer per week   Objective:   BP (!) 139/92   Pulse (!) 108   Temp 98.2 F (36.8 C)   Resp 16   Ht 5\' 2"  (1.575 m)   Wt 181 lb (82.1 kg)   BMI 33.11 kg/m   Physical Exam  Constitutional: She is oriented to person, place, and time. She appears well-developed and well-nourished.  HENT:  Head: Normocephalic and atraumatic.  Eyes: Pupils are equal, round, and reactive to light.  Neck: Normal range of motion. Neck supple.  Cardiovascular: Normal rate and regular rhythm.   Pulmonary/Chest: Effort normal and breath sounds normal.  Abdominal: Soft. Bowel sounds  are normal.  Neurological: She is alert and oriented to person, place, and time.  Skin: Skin is warm and dry.  Psychiatric: She has a normal mood and affect.    Diabetic foot exam WNL.      Assessment & Plan:         DM:  Not controlled- A1C 10.  A1C goal <7.  Encourage diabetic diet and exercise.  Continue current medication regimen.  Will send PAP for trulicity to be started and given weekly.   FU in 3 months.      [facility]   [facility]

## 2016-07-14 ENCOUNTER — Other Ambulatory Visit: Payer: Self-pay

## 2016-08-11 ENCOUNTER — Other Ambulatory Visit: Payer: Self-pay

## 2016-08-11 MED ORDER — NORTRIPTYLINE HCL 25 MG PO CAPS: 25.0000 mg | ORAL_CAPSULE | Freq: Every day | ORAL | 3 refills | Status: DC

## 2016-08-11 NOTE — Telephone Encounter (Signed)
Received fax from [facility] requesting refill on nortriptyline HCL 25mg  for patient.

## 2016-08-18 ENCOUNTER — Other Ambulatory Visit: Payer: Self-pay

## 2016-08-18 NOTE — Telephone Encounter (Signed)
Patient requested refill on nortriptyline 25mg .  Pharmacy [facility].

## 2016-08-25 ENCOUNTER — Other Ambulatory Visit: Payer: Self-pay

## 2016-08-25 DIAGNOSIS — E119 Type 2 diabetes mellitus without complications: Secondary | ICD-10-CM

## 2016-08-25 DIAGNOSIS — E538 Deficiency of other specified B group vitamins: Secondary | ICD-10-CM

## 2016-08-25 MED ORDER — CYANOCOBALAMIN 1000 MCG/ML IJ SOLN
1000.0000 ug | Freq: Once | INTRAMUSCULAR | Status: AC
  Administered 2016-08-25: 1000 ug via INTRAMUSCULAR

## 2016-08-25 MED ORDER — NORTRIPTYLINE HCL 25 MG PO CAPS: 25.0000 mg | ORAL_CAPSULE | Freq: Every day | ORAL | 3 refills | Status: DC

## 2016-08-25 NOTE — Progress Notes (Unsigned)
B12 injection given in left deltoid. Pt tolerated well.

## 2016-09-01 ENCOUNTER — Other Ambulatory Visit: Payer: Self-pay

## 2016-09-06 ENCOUNTER — Other Ambulatory Visit: Payer: Self-pay

## 2016-09-06 DIAGNOSIS — E119 Type 2 diabetes mellitus without complications: Secondary | ICD-10-CM

## 2016-09-07 LAB — LIPID PANEL
CHOL/HDL RATIO: 4 ratio (ref 0.0–4.4)
Cholesterol, Total: 153 mg/dL (ref 100–199)
HDL: 38 mg/dL — AB (ref >39)
LDL Calculated: 78 mg/dL (ref 0–99)
Triglycerides: 187 mg/dL — ABNORMAL HIGH (ref 0–149)
VLDL CHOLESTEROL CAL: 37 mg/dL (ref 5–40)

## 2016-09-07 LAB — COMPREHENSIVE METABOLIC PANEL
ALBUMIN: 4.1 g/dL (ref 3.5–5.5)
ALK PHOS: 67 IU/L (ref 39–117)
ALT: 23 IU/L (ref 0–32)
AST: 12 IU/L (ref 0–40)
Albumin/Globulin Ratio: 1.7 (ref 1.2–2.2)
BUN / CREAT RATIO: 16 (ref 9–23)
BUN: 17 mg/dL (ref 6–24)
Bilirubin Total: <0.2 mg/dL (ref 0.0–1.2)
CALCIUM: 9.1 mg/dL (ref 8.7–10.2)
CO2: 26 mmol/L (ref 18–29)
CREATININE: 1.05 mg/dL — AB (ref 0.57–1.00)
Chloride: 93 mmol/L — ABNORMAL LOW (ref 96–106)
GFR calc non Af Amer: 60 mL/min/{1.73_m2} (ref >59)
GFR, EST AFRICAN AMERICAN: 69 mL/min/{1.73_m2} (ref >59)
GLOBULIN, TOTAL: 2.4 g/dL (ref 1.5–4.5)
GLUCOSE: 253 mg/dL — AB (ref 65–99)
Potassium: 4.5 mmol/L (ref 3.5–5.2)
SODIUM: 137 mmol/L (ref 134–144)
TOTAL PROTEIN: 6.5 g/dL (ref 6.0–8.5)

## 2016-09-07 LAB — HEMOGLOBIN A1C
Est. average glucose Bld gHb Est-mCnc: 229 mg/dL
HEMOGLOBIN A1C: 9.6 % — AB (ref 4.8–5.6)

## 2016-09-08 ENCOUNTER — Ambulatory Visit: Payer: Self-pay | Admitting: Ophthalmology

## 2016-09-08 ENCOUNTER — Ambulatory Visit: Payer: Self-pay | Admitting: Adult Health Nurse Practitioner

## 2016-09-08 VITALS — BP 126/82 | HR 107 | Temp 98.5°F | Wt 194.6 lb

## 2016-09-08 DIAGNOSIS — Z794 Long term (current) use of insulin: Secondary | ICD-10-CM

## 2016-09-08 DIAGNOSIS — E119 Type 2 diabetes mellitus without complications: Secondary | ICD-10-CM

## 2016-09-08 DIAGNOSIS — E1140 Type 2 diabetes mellitus with diabetic neuropathy, unspecified: Secondary | ICD-10-CM

## 2016-09-08 DIAGNOSIS — I10 Essential (primary) hypertension: Secondary | ICD-10-CM

## 2016-09-08 DIAGNOSIS — E7800 Pure hypercholesterolemia, unspecified: Secondary | ICD-10-CM

## 2016-09-08 LAB — GLUCOSE, POCT (MANUAL RESULT ENTRY): POC Glucose: 158 mg/dl — AB (ref 70–99)

## 2016-09-08 LAB — HM DIABETES EYE EXAM

## 2016-09-08 MED ORDER — NORTRIPTYLINE HCL 25 MG PO CAPS: 25.0000 mg | ORAL_CAPSULE | Freq: Every day | ORAL | 3 refills | Status: DC

## 2016-09-08 MED ORDER — PIOGLITAZONE HCL 15 MG PO TABS: 15.0000 mg | ORAL_TABLET | Freq: Every day | ORAL | 0 refills | Status: DC

## 2016-09-08 MED ORDER — FUROSEMIDE 20 MG PO TABS: 20.0000 mg | ORAL_TABLET | Freq: Every day | ORAL | 1 refills | Status: DC

## 2016-09-08 MED ORDER — METFORMIN HCL 1000 MG PO TABS
1000.0000 mg | ORAL_TABLET | Freq: Two times a day (BID) | ORAL | 1 refills | Status: DC
Start: 2016-09-08 — End: 2016-12-08

## 2016-09-08 NOTE — Progress Notes (Signed)
Patient: Renee Bush Female    DOB: 1961/04/06   56 y.o.   MRN: [id number] Visit Date: 09/08/2016  Today's Provider: Jacelyn Pieah Doles-Johnson, NP   Chief Complaint  Patient presents with  . Follow-up   Subjective:    HPI    DM:  A1C down from 10 to 9.6 Unable to get trulicity due to husbands income.  Taking 40 units of 70-30 BID.  Pt reports hypoglycemia episode 3 weeks ago with CBG in the 60s- seldom occurrence.  Checking CBG every other day- average-150-200 fasting Pt states that she cut back on her sweets.   HLD:  LDL 78.  Taking crestor 5mg .     Allergies  Allergen Reactions  . Etodolac Rash  . Penicillin G Rash  . Penicillins Rash   Previous Medications   ASPIRIN 81 MG TABLET    Take 81 mg by mouth daily.   FUROSEMIDE (LASIX) 20 MG TABLET    Take 1 tablet (20 mg total) by mouth daily.   GABAPENTIN (NEURONTIN) 400 MG CAPSULE    Take 1 capsule (400 mg total) by mouth 3 (three) times daily.   INSULIN ASPART PROTAMINE- ASPART (NOVOLOG MIX 70/30) (70-30) 100 UNIT/ML INJECTION    Inject 0.09 mLs (9 Units total) into the skin 2 (two) times daily with a meal.   LISINOPRIL (PRINIVIL,ZESTRIL) 10 MG TABLET    Take 1 tablet (10 mg total) by mouth daily.   METFORMIN (GLUCOPHAGE) 1000 MG TABLET    Take 1 tablet (1,000 mg total) by mouth 2 (two) times daily with a meal.   METOPROLOL (LOPRESSOR) 50 MG TABLET    Take 1 tablet (50 mg total) by mouth daily.   NAPROXEN (NAPROSYN) 500 MG TABLET    Take 1 tablet (500 mg total) by mouth 2 (two) times daily with a meal.   NITROGLYCERIN (NITROSTAT) 0.4 MG SL TABLET    Place 0.4 mg under the tongue every 5 (five) minutes as needed for chest pain.   NORTRIPTYLINE (PAMELOR) 25 MG CAPSULE    Take 1 capsule (25 mg total) by mouth at bedtime. 1 to 2 capsules by mouth at bedtime   OMEGA-3 FATTY ACIDS (FISH OIL) 1000 MG CAPS    Take 1,000 mg by mouth 2 (two) times daily.   OMEPRAZOLE (PRILOSEC) 20 MG CAPSULE    Take 1 capsule (20 mg total) by  mouth 2 (two) times daily.   ROSUVASTATIN (CRESTOR) 5 MG TABLET    Take 1 tablet (5 mg total) by mouth daily.    Review of Systems  All other systems reviewed and are negative.   Social History  Substance Use Topics  . Smoking status: Former Smoker    Quit date: 04/17/2005  . Smokeless tobacco: Not on file  . Alcohol use 8.4 oz/week    14 Cans of beer per week   Objective:   BP 126/82   Pulse (!) 107   Temp 98.5 F (36.9 C)   Wt 194 lb 9.6 oz (88.3 kg)   BMI 35.59 kg/m   Physical Exam  Constitutional: She is oriented to person, place, and time. She appears well-developed and well-nourished.  HENT:  Head: Normocephalic and atraumatic.  Eyes: Pupils are equal, round, and reactive to light.  Neck: Normal range of motion. Neck supple.  Cardiovascular: Normal rate, regular rhythm and normal heart sounds.   Pulmonary/Chest: Effort normal and breath sounds normal.  Abdominal: Soft. Bowel sounds are normal.  Musculoskeletal: She exhibits edema.  1+ BLE edema  Neurological: She is alert and oriented to person, place, and time.  Skin: Skin is warm and dry.  Psychiatric: She has a normal mood and affect.  Vitals reviewed.       Assessment & Plan:         DM:  Not controlled.  Goal A1C <7.  Encourage diabetic diet and exercise.  Continue current medication regimen. Actos 15mg  added.  Microalbumin ordered.   FU in 1 month for SE and tolerability.    HTN: Controlled.  Continue current regimen.  Low salt diet and exercise.  Encourage compression hose for BLE edema.   HLD:  Controlled.  Continue current regimen.  Encourage low cholesterol, low fat diet and exercise.      Jacelyn Pi, NP   [facility]

## 2016-09-09 LAB — MICROALBUMIN / CREATININE URINE RATIO
Creatinine, Urine: 63.4 mg/dL
Microalbumin, Urine: <3 ug/mL

## 2016-09-22 ENCOUNTER — Other Ambulatory Visit: Payer: Self-pay

## 2016-09-22 DIAGNOSIS — E538 Deficiency of other specified B group vitamins: Secondary | ICD-10-CM

## 2016-09-26 ENCOUNTER — Other Ambulatory Visit: Payer: Self-pay | Admitting: Adult Health Nurse Practitioner

## 2016-09-26 DIAGNOSIS — E119 Type 2 diabetes mellitus without complications: Secondary | ICD-10-CM

## 2016-09-26 MED ORDER — NORTRIPTYLINE HCL 25 MG PO CAPS: 25.0000 mg | ORAL_CAPSULE | Freq: Every day | ORAL | 3 refills | Status: DC

## 2016-09-28 ENCOUNTER — Other Ambulatory Visit: Payer: Self-pay | Admitting: Ophthalmology

## 2016-10-13 ENCOUNTER — Ambulatory Visit: Payer: Self-pay | Admitting: Urology

## 2016-10-13 VITALS — BP 129/75 | HR 106 | Temp 98.7°F | Wt 190.0 lb

## 2016-10-13 DIAGNOSIS — E119 Type 2 diabetes mellitus without complications: Secondary | ICD-10-CM

## 2016-10-13 DIAGNOSIS — I10 Essential (primary) hypertension: Secondary | ICD-10-CM

## 2016-10-13 LAB — GLUCOSE, POCT (MANUAL RESULT ENTRY): POC GLUCOSE: 173 mg/dL — AB (ref 70–99)

## 2016-10-13 MED ORDER — METOPROLOL TARTRATE 50 MG PO TABS: 50.0000 mg | ORAL_TABLET | Freq: Every day | ORAL | 7 refills | Status: DC

## 2016-10-13 MED ORDER — PIOGLITAZONE HCL 15 MG PO TABS
15.0000 mg | ORAL_TABLET | Freq: Every day | ORAL | 12 refills | Status: DC
Start: 2016-10-13 — End: 2016-12-08

## 2016-10-13 NOTE — Progress Notes (Signed)
Patient: Renee Bush Female    DOB: 1960-09-14   56 y.o.   MRN: [id number] Visit Date: 10/13/2016  Today's Provider: [facility]   Chief Complaint  Patient presents with  . Medication Refill  . Diabetes    type 2 ranges between 130-175   Subjective:    HPI    DM:  A1C down from 10 to 9.6 Unable to get trulicity due to husbands income.  Taking 40 units of 70-30 BID, metformin 1000 mg bid, Actos 15 mg qd Pt reports hypoglycemia episode 6 weeks ago with CBG in the 60s- seldom occurrence.  Checking CBG every other day- average-130-175 fasting - checking sugars around noon Pt states that she cut back on her sweets.   HLD: 38 LDL 78.  Taking crestor 5mg .  Eye exam in January - RTC one year   Allergies  Allergen Reactions  . Etodolac Rash  . Penicillin G Rash  . Penicillins Rash   Previous Medications   ASPIRIN 81 MG TABLET    Take 81 mg by mouth daily.   FUROSEMIDE (LASIX) 20 MG TABLET    Take 1 tablet (20 mg total) by mouth daily.   GABAPENTIN (NEURONTIN) 400 MG CAPSULE    Take 1 capsule (400 mg total) by mouth 3 (three) times daily.   INSULIN ASPART PROTAMINE- ASPART (NOVOLOG MIX 70/30) (70-30) 100 UNIT/ML INJECTION    Inject 0.09 mLs (9 Units total) into the skin 2 (two) times daily with a meal.   LISINOPRIL (PRINIVIL,ZESTRIL) 10 MG TABLET    Take 1 tablet (10 mg total) by mouth daily.   METFORMIN (GLUCOPHAGE) 1000 MG TABLET    Take 1 tablet (1,000 mg total) by mouth 2 (two) times daily with a meal.   NAPROXEN (NAPROSYN) 500 MG TABLET    Take 1 tablet (500 mg total) by mouth 2 (two) times daily with a meal.   NITROGLYCERIN (NITROSTAT) 0.4 MG SL TABLET    Place 0.4 mg under the tongue every 5 (five) minutes as needed for chest pain.   NORTRIPTYLINE (PAMELOR) 25 MG CAPSULE    Take 1 capsule (25 mg total) by mouth at bedtime. 1 to 2 capsules by mouth at bedtime   OMEGA-3 FATTY ACIDS (FISH OIL) 1000 MG CAPS    Take 1,000 mg by mouth 2 (two) times daily.   OMEPRAZOLE (PRILOSEC) 20 MG CAPSULE    Take 1 capsule (20 mg total) by mouth 2 (two) times daily.   ROSUVASTATIN (CRESTOR) 5 MG TABLET    Take 1 tablet (5 mg total) by mouth daily.    Review of Systems  All other systems reviewed and are negative.   Social History  Substance Use Topics  . Smoking status: Former Smoker    Quit date: 04/17/2005  . Smokeless tobacco: Not on file  . Alcohol use 8.4 oz/week    14 Cans of beer per week   Objective:   BP 129/75   Pulse (!) 106   Temp 98.7 F (37.1 C)   Wt 190 lb (86.2 kg)   BMI 34.75 kg/m   Physical Exam  Constitutional: She is oriented to person, place, and time. She appears well-developed and well-nourished.  HENT:  Head: Normocephalic and atraumatic.  Eyes: Pupils are equal, round, and reactive to light.  Neck: Normal range of motion. Neck supple.  Cardiovascular: Normal rate, regular rhythm and normal heart sounds.   Pulmonary/Chest: Effort normal and breath sounds normal.  Abdominal: Soft. Bowel sounds are normal.  Musculoskeletal: She exhibits edema.  1+ BLE edema  Neurological: She is alert and oriented to person, place, and time.  Skin: Skin is warm and dry.  Psychiatric: She has a normal mood and affect.  Vitals reviewed.       Assessment & Plan:         DM:  Not controlled.  Goal A1C <7.  Encourage diabetic diet and exercise.  Continue current medication regimen.  Actos 15mg  added at last visit, tolerating medications Microalbumin good   HTN: Controlled.  Continue current regimen.  Low salt diet and exercise.  Using compression hose for BLE edema.   HLD:  Controlled.  Continue current regimen.  Encourage low cholesterol, low fat diet and exercise.      [facility]   [facility] [place]

## 2016-10-20 ENCOUNTER — Other Ambulatory Visit: Payer: Self-pay

## 2016-10-20 DIAGNOSIS — E538 Deficiency of other specified B group vitamins: Secondary | ICD-10-CM
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
## 2016-10-26 MED ORDER — CYANOCOBALAMIN 1000 MCG/ML IJ SOLN
1000.0000 ug | Freq: Once | INTRAMUSCULAR | Status: AC
  Administered 2016-11-18: 1000 ug via INTRAMUSCULAR

## 2016-11-17 ENCOUNTER — Other Ambulatory Visit: Payer: Self-pay

## 2016-11-17 DIAGNOSIS — E538 Deficiency of other specified B group vitamins: Secondary | ICD-10-CM

## 2016-11-17 MED ORDER — CYANOCOBALAMIN 1000 MCG/ML IJ SOLN: 1000.0000 ug | Freq: Once | INTRAMUSCULAR | 0 refills | Status: AC

## 2016-11-22 NOTE — Progress Notes (Unsigned)
A user error has taken place.

## 2016-12-01 ENCOUNTER — Other Ambulatory Visit: Payer: Self-pay

## 2016-12-01 DIAGNOSIS — I10 Essential (primary) hypertension: Secondary | ICD-10-CM

## 2016-12-01 DIAGNOSIS — E7800 Pure hypercholesterolemia, unspecified: Secondary | ICD-10-CM

## 2016-12-01 DIAGNOSIS — E119 Type 2 diabetes mellitus without complications: Secondary | ICD-10-CM

## 2016-12-02 LAB — COMPREHENSIVE METABOLIC PANEL
ALT: 17 IU/L (ref 0–32)
AST: 15 IU/L (ref 0–40)
Albumin/Globulin Ratio: 1.6 (ref 1.2–2.2)
Albumin: 4.1 g/dL (ref 3.5–5.5)
Alkaline Phosphatase: 70 IU/L (ref 39–117)
BUN/Creatinine Ratio: 16 (ref 9–23)
BUN: 17 mg/dL (ref 6–24)
Bilirubin Total: <0.2 mg/dL (ref 0.0–1.2)
CO2: 26 mmol/L (ref 18–29)
Calcium: 9.1 mg/dL (ref 8.7–10.2)
Chloride: 101 mmol/L (ref 96–106)
Creatinine, Ser: 1.06 mg/dL — ABNORMAL HIGH (ref 0.57–1.00)
GFR calc Af Amer: 68 mL/min/{1.73_m2} (ref >59)
GFR calc non Af Amer: 59 mL/min/{1.73_m2} — ABNORMAL LOW (ref >59)
Globulin, Total: 2.5 g/dL (ref 1.5–4.5)
Glucose: 200 mg/dL — ABNORMAL HIGH (ref 65–99)
Potassium: 4.9 mmol/L (ref 3.5–5.2)
Sodium: 144 mmol/L (ref 134–144)
Total Protein: 6.6 g/dL (ref 6.0–8.5)

## 2016-12-02 LAB — LIPID PANEL
Chol/HDL Ratio: 3.3 ratio (ref 0.0–4.4)
Cholesterol, Total: 129 mg/dL (ref 100–199)
HDL: 39 mg/dL — ABNORMAL LOW (ref >39)
LDL Calculated: 63 mg/dL (ref 0–99)
Triglycerides: 136 mg/dL (ref 0–149)
VLDL Cholesterol Cal: 27 mg/dL (ref 5–40)

## 2016-12-02 LAB — HEMOGLOBIN A1C
Est. average glucose Bld gHb Est-mCnc: 232 mg/dL
HEMOGLOBIN A1C: 9.7 % — AB (ref 4.8–5.6)

## 2016-12-08 ENCOUNTER — Ambulatory Visit: Payer: Self-pay | Admitting: Adult Health Nurse Practitioner

## 2016-12-08 VITALS — BP 140/89 | HR 111 | Ht 62.0 in | Wt 201.3 lb

## 2016-12-08 DIAGNOSIS — E119 Type 2 diabetes mellitus without complications: Secondary | ICD-10-CM

## 2016-12-08 DIAGNOSIS — E7800 Pure hypercholesterolemia, unspecified: Secondary | ICD-10-CM

## 2016-12-08 DIAGNOSIS — Z794 Long term (current) use of insulin: Secondary | ICD-10-CM

## 2016-12-08 DIAGNOSIS — I10 Essential (primary) hypertension: Secondary | ICD-10-CM

## 2016-12-08 DIAGNOSIS — E1140 Type 2 diabetes mellitus with diabetic neuropathy, unspecified: Secondary | ICD-10-CM

## 2016-12-08 MED ORDER — FUROSEMIDE 20 MG PO TABS: 20.0000 mg | ORAL_TABLET | Freq: Every day | ORAL | 1 refills | Status: DC

## 2016-12-08 MED ORDER — METOPROLOL TARTRATE 50 MG PO TABS: 50.0000 mg | ORAL_TABLET | Freq: Two times a day (BID) | ORAL | 4 refills | Status: DC

## 2016-12-08 MED ORDER — METFORMIN HCL 1000 MG PO TABS: 1000.0000 mg | ORAL_TABLET | Freq: Two times a day (BID) | ORAL | 1 refills | Status: DC

## 2016-12-08 MED ORDER — ROSUVASTATIN CALCIUM 5 MG PO TABS: 5.0000 mg | ORAL_TABLET | Freq: Every day | ORAL | 7 refills | Status: DC

## 2016-12-08 MED ORDER — NORTRIPTYLINE HCL 25 MG PO CAPS: 25.0000 mg | ORAL_CAPSULE | Freq: Every day | ORAL | 3 refills | Status: DC

## 2016-12-08 MED ORDER — PIOGLITAZONE HCL 30 MG PO TABS: 15.0000 mg | ORAL_TABLET | Freq: Every day | ORAL | 3 refills | Status: DC

## 2016-12-08 NOTE — Progress Notes (Signed)
Patient: Renee Bush Female    DOB: 08-Feb-1961   56 y.o.   MRN: [id number] Visit Date: 12/08/2016  Today's Provider: Jacelyn Pi, NP   Chief Complaint  Patient presents with  . Follow-up  . Edema    L foot/leg ; R ankle   Subjective:    HPI  DM:  Taking medications as directed.  CBGs- averaging 140-150 fasting after meals over 200.  Pt states that she has hypoglycemia about twice a month- reports symptoms.  Somewhat monitoring diet with walking daily.  Last A1C 9.7 no improvement from previous.  Taking 70/30 40 units BID.   HTN/HLD: Taking medications as directed.  No reports of myalgias.    Pt states that she is having a lot of hot flashes during the day. Peri-menopausal.       Allergies  Allergen Reactions  . Etodolac Rash  . Penicillin G Rash  . Penicillins Rash   Previous Medications   ASPIRIN 81 MG TABLET    Take 81 mg by mouth daily.   FUROSEMIDE (LASIX) 20 MG TABLET    Take 1 tablet (20 mg total) by mouth daily.   GABAPENTIN (NEURONTIN) 400 MG CAPSULE    Take 1 capsule (400 mg total) by mouth 3 (three) times daily.   INSULIN ASPART PROTAMINE- ASPART (NOVOLOG MIX 70/30) (70-30) 100 UNIT/ML INJECTION    Inject 0.09 mLs (9 Units total) into the skin 2 (two) times daily with a meal.   LISINOPRIL (PRINIVIL,ZESTRIL) 10 MG TABLET    Take 1 tablet (10 mg total) by mouth daily.   METFORMIN (GLUCOPHAGE) 1000 MG TABLET    Take 1 tablet (1,000 mg total) by mouth 2 (two) times daily with a meal.   METOPROLOL (LOPRESSOR) 50 MG TABLET    Take 1 tablet (50 mg total) by mouth daily.   NAPROXEN (NAPROSYN) 500 MG TABLET    Take 1 tablet (500 mg total) by mouth 2 (two) times daily with a meal.   NITROGLYCERIN (NITROSTAT) 0.4 MG SL TABLET    Place 0.4 mg under the tongue every 5 (five) minutes as needed for chest pain.   NORTRIPTYLINE (PAMELOR) 25 MG CAPSULE    Take 1 capsule (25 mg total) by mouth at bedtime. 1 to 2 capsules by mouth at bedtime   OMEGA-3 FATTY  ACIDS (FISH OIL) 1000 MG CAPS    Take 1,000 mg by mouth 2 (two) times daily.   OMEPRAZOLE (PRILOSEC) 20 MG CAPSULE    Take 1 capsule (20 mg total) by mouth 2 (two) times daily.   PIOGLITAZONE (ACTOS) 15 MG TABLET    Take 1 tablet (15 mg total) by mouth daily.   ROSUVASTATIN (CRESTOR) 5 MG TABLET    Take 1 tablet (5 mg total) by mouth daily.    Review of Systems  All other systems reviewed and are negative.   Social History  Substance Use Topics  . Smoking status: Former Smoker    Quit date: 04/17/2005  . Smokeless tobacco: Never Used  . Alcohol use No     Comment: quit ~65mo ago   Objective:   BP 140/89   Pulse (!) 111   Ht  (1.575 m)   Wt 201 lb 4.8 oz (91.3 kg)   BMI 36.82 kg/m   Physical Exam  Constitutional: She is oriented to person, place, and time. She appears well-developed and well-nourished.  HENT:  Head: Normocephalic and atraumatic.  Cardiovascular: Normal rate, regular rhythm and normal heart sounds.  Pulmonary/Chest: Effort normal and breath sounds normal.  Abdominal: Soft. Bowel sounds are normal.  Neurological: She is alert and oriented to person, place, and time.  Skin: Skin is warm and dry.  Vitals reviewed.       Assessment & Plan:         DM:   Not controlled.  Encourage diabetic diet and exercise.  Continue current insulin regimen.  Increase Actos to  daily.   HTN:  Borderline today.   Goal BP <140/80.  Increase Metoprolol  BID.   Encourage low salt diet and exercise.   HLD:  Controlled. .  Continue current regimen.  Encourage low cholesterol, low fat diet and exercise.    FU in 4 weeks for BP and HR check.  Given list of nonpharmalogical OTC remedies for hot flashes.       Jacelyn Pi, NP   [facility]

## 2016-12-22 ENCOUNTER — Other Ambulatory Visit: Payer: Self-pay

## 2016-12-22 DIAGNOSIS — E538 Deficiency of other specified B group vitamins: Secondary | ICD-10-CM

## 2017-01-05 ENCOUNTER — Ambulatory Visit: Payer: Self-pay | Admitting: Adult Health Nurse Practitioner

## 2017-01-05 VITALS — BP 147/90 | HR 103 | Temp 98.1°F | Wt 204.7 lb

## 2017-01-05 DIAGNOSIS — I10 Essential (primary) hypertension: Secondary | ICD-10-CM

## 2017-01-05 DIAGNOSIS — E119 Type 2 diabetes mellitus without complications: Secondary | ICD-10-CM

## 2017-01-05 LAB — GLUCOSE, POCT (MANUAL RESULT ENTRY): POC GLUCOSE: 113 mg/dL — AB (ref 70–99)

## 2017-01-05 MED ORDER — OMEPRAZOLE 20 MG PO CPDR: 20.0000 mg | DELAYED_RELEASE_CAPSULE | Freq: Two times a day (BID) | ORAL | 12 refills | Status: DC

## 2017-01-05 MED ORDER — METOPROLOL TARTRATE 75 MG PO TABS: 50.0000 mg | ORAL_TABLET | Freq: Two times a day (BID) | ORAL | 3 refills | Status: DC

## 2017-01-05 MED ORDER — LISINOPRIL 10 MG PO TABS: 10.0000 mg | ORAL_TABLET | Freq: Every day | ORAL | 12 refills | Status: DC

## 2017-01-05 MED ORDER — PIOGLITAZONE HCL 30 MG PO TABS: 30.0000 mg | ORAL_TABLET | Freq: Every day | ORAL | 3 refills | Status: DC

## 2017-01-05 NOTE — Progress Notes (Signed)
Patient: Renee GuadeloupeCatherine C Haglund Female    DOB: 1961-07-25   56 y.o.   MRN: [id number] Visit Date: 01/05/2017  Today's Provider: [facility]   Chief Complaint  Patient presents with  . Follow-up   Subjective:    HPI  Here today for BP check.  Last visit BP was borderline at 140/89.  Metoprolol was increased to 50mg  BID. Pt states BP has been up at home as well.   Pt states sugars have been improved.     Allergies  Allergen Reactions  . Etodolac Rash  . Penicillin G Rash  . Penicillins Rash   Previous Medications   ASPIRIN 81 MG TABLET    Take 81 mg by mouth daily.   FUROSEMIDE (LASIX) 20 MG TABLET    Take 1 tablet (20 mg total) by mouth daily.   GABAPENTIN (NEURONTIN) 400 MG CAPSULE    Take 1 capsule (400 mg total) by mouth 3 (three) times daily.   INSULIN ASPART PROTAMINE- ASPART (NOVOLOG MIX 70/30) (70-30) 100 UNIT/ML INJECTION    Inject 0.09 mLs (9 Units total) into the skin 2 (two) times daily with a meal.   LISINOPRIL (PRINIVIL,ZESTRIL) 10 MG TABLET    Take 1 tablet (10 mg total) by mouth daily.   METFORMIN (GLUCOPHAGE) 1000 MG TABLET    Take 1 tablet (1,000 mg total) by mouth 2 (two) times daily with a meal.   METOPROLOL (LOPRESSOR) 50 MG TABLET    Take 1 tablet (50 mg total) by mouth 2 (two) times daily.   NAPROXEN (NAPROSYN) 500 MG TABLET    Take 1 tablet (500 mg total) by mouth 2 (two) times daily with a meal.   NITROGLYCERIN (NITROSTAT) 0.4 MG SL TABLET    Place 0.4 mg under the tongue every 5 (five) minutes as needed for chest pain.   NORTRIPTYLINE (PAMELOR) 25 MG CAPSULE    Take 1 capsule (25 mg total) by mouth at bedtime. 1 to 2 capsules by mouth at bedtime   OMEGA-3 FATTY ACIDS (FISH OIL) 1000 MG CAPS    Take 1,000 mg by mouth 2 (two) times daily.   OMEPRAZOLE (PRILOSEC) 20 MG CAPSULE    Take 1 capsule (20 mg total) by mouth 2 (two) times daily.   PIOGLITAZONE (ACTOS) 30 MG TABLET    Take 0.5 tablets (15 mg total) by mouth daily.   ROSUVASTATIN (CRESTOR)  5 MG TABLET    Take 1 tablet (5 mg total) by mouth daily.    Review of Systems  All other systems reviewed and are negative.   Social History  Substance Use Topics  . Smoking status: Former Smoker    Quit date: 04/17/2005  . Smokeless tobacco: Never Used  . Alcohol use No     Comment: quit ~6976mo ago   Objective:   BP (!) 147/90   Pulse (!) 103   Temp 98.1 F (36.7 C)   Wt 204 lb 11.2 oz (92.9 kg)   BMI 37.44 kg/m   Physical Exam  Constitutional: She is oriented to person, place, and time. She appears well-developed and well-nourished.  HENT:  Head: Normocephalic.  Cardiovascular: Normal rate, regular rhythm and normal heart sounds.   Pulmonary/Chest: Effort normal and breath sounds normal.  Abdominal: Soft.  Neurological: She is alert and oriented to person, place, and time.  Skin: Skin is warm and dry.  Vitals reviewed.       Assessment & Plan:         HTN:  Not controlled.  Goal BP <140/90.  Continue current medication regimen and increase Metoprolol to 75mg  BID.  Encourage low salt diet and exercise.  Continue to monitor BP at home.    Continue current DM regimen.   [facility]   [facility]

## 2017-01-10 ENCOUNTER — Other Ambulatory Visit: Payer: Self-pay

## 2017-01-10 DIAGNOSIS — I10 Essential (primary) hypertension: Secondary | ICD-10-CM

## 2017-01-10 MED ORDER — METOPROLOL TARTRATE 50 MG PO TABS: 50.0000 mg | ORAL_TABLET | Freq: Two times a day (BID) | ORAL | 2 refills | Status: DC

## 2017-01-10 MED ORDER — METOPROLOL TARTRATE 25 MG PO TABS: 25.0000 mg | ORAL_TABLET | Freq: Two times a day (BID) | ORAL | 2 refills | Status: DC

## 2017-01-26 ENCOUNTER — Other Ambulatory Visit: Payer: Self-pay

## 2017-01-26 DIAGNOSIS — D519 Vitamin B12 deficiency anemia, unspecified: Secondary | ICD-10-CM

## 2017-01-26 MED ORDER — CYANOCOBALAMIN 1000 MCG/ML IJ SOLN
1000.0000 ug | Freq: Once | INTRAMUSCULAR | Status: AC
  Administered 2017-01-26: 1000 ug via INTRAMUSCULAR

## 2017-02-23 ENCOUNTER — Other Ambulatory Visit: Payer: Self-pay

## 2017-03-09 ENCOUNTER — Other Ambulatory Visit: Payer: Self-pay

## 2017-03-09 DIAGNOSIS — E119 Type 2 diabetes mellitus without complications: Secondary | ICD-10-CM

## 2017-03-09 NOTE — Progress Notes (Unsigned)
cmp

## 2017-03-10 LAB — HEMOGLOBIN A1C
Est. average glucose Bld gHb Est-mCnc: 229 mg/dL
Hgb A1c MFr Bld: 9.6 % — ABNORMAL HIGH (ref 4.8–5.6)

## 2017-03-10 LAB — COMPREHENSIVE METABOLIC PANEL
A/G RATIO: 1.7 (ref 1.2–2.2)
ALT: 13 IU/L (ref 0–32)
AST: 16 IU/L (ref 0–40)
Albumin: 3.6 g/dL (ref 3.5–5.5)
Alkaline Phosphatase: 58 IU/L (ref 39–117)
BUN/Creatinine Ratio: 17 (ref 9–23)
BUN: 18 mg/dL (ref 6–24)
Bilirubin Total: <0.2 mg/dL (ref 0.0–1.2)
CALCIUM: 9.1 mg/dL (ref 8.7–10.2)
CO2: 21 mmol/L (ref 20–29)
Chloride: 104 mmol/L (ref 96–106)
Creatinine, Ser: 1.07 mg/dL — ABNORMAL HIGH (ref 0.57–1.00)
GFR calc non Af Amer: 58 mL/min/{1.73_m2} — ABNORMAL LOW (ref >59)
GFR, EST AFRICAN AMERICAN: 67 mL/min/{1.73_m2} (ref >59)
Globulin, Total: 2.1 g/dL (ref 1.5–4.5)
Glucose: 81 mg/dL (ref 65–99)
Potassium: 4.2 mmol/L (ref 3.5–5.2)
Sodium: 141 mmol/L (ref 134–144)
TOTAL PROTEIN: 5.7 g/dL — AB (ref 6.0–8.5)

## 2017-03-16 ENCOUNTER — Ambulatory Visit: Payer: Self-pay | Admitting: Adult Health Nurse Practitioner

## 2017-03-16 VITALS — BP 129/85 | HR 104 | Wt 210.0 lb

## 2017-03-16 DIAGNOSIS — E1140 Type 2 diabetes mellitus with diabetic neuropathy, unspecified: Secondary | ICD-10-CM

## 2017-03-16 DIAGNOSIS — E119 Type 2 diabetes mellitus without complications: Secondary | ICD-10-CM

## 2017-03-16 DIAGNOSIS — E538 Deficiency of other specified B group vitamins: Secondary | ICD-10-CM

## 2017-03-16 DIAGNOSIS — E7800 Pure hypercholesterolemia, unspecified: Secondary | ICD-10-CM

## 2017-03-16 DIAGNOSIS — Z794 Long term (current) use of insulin: Secondary | ICD-10-CM

## 2017-03-16 DIAGNOSIS — I10 Essential (primary) hypertension: Secondary | ICD-10-CM

## 2017-03-16 LAB — POCT GLUCOSE (DEVICE FOR HOME USE): POC GLUCOSE: 142 mg/dL — AB (ref 70–99)

## 2017-03-16 MED ORDER — METOPROLOL TARTRATE 75 MG PO TABS: 50.0000 mg | ORAL_TABLET | Freq: Two times a day (BID) | ORAL | 1 refills | Status: DC

## 2017-03-16 MED ORDER — FUROSEMIDE 20 MG PO TABS: 20.0000 mg | ORAL_TABLET | Freq: Every day | ORAL | 1 refills | Status: DC

## 2017-03-16 MED ORDER — METFORMIN HCL 1000 MG PO TABS: 1000.0000 mg | ORAL_TABLET | Freq: Two times a day (BID) | ORAL | 1 refills | Status: DC

## 2017-03-16 MED ORDER — CYANOCOBALAMIN 1000 MCG/ML IJ SOLN
1000.0000 ug | Freq: Once | INTRAMUSCULAR | Status: AC
  Administered 2017-03-16: 1000 ug via INTRAMUSCULAR

## 2017-03-16 MED ORDER — NORTRIPTYLINE HCL 25 MG PO CAPS: 25.0000 mg | ORAL_CAPSULE | Freq: Every day | ORAL | 3 refills | Status: DC

## 2017-03-16 MED ORDER — PIOGLITAZONE HCL 30 MG PO TABS: 30.0000 mg | ORAL_TABLET | Freq: Every day | ORAL | 3 refills | Status: DC

## 2017-03-16 MED ORDER — GABAPENTIN 400 MG PO CAPS: 400.0000 mg | ORAL_CAPSULE | Freq: Three times a day (TID) | ORAL | 7 refills | Status: DC

## 2017-03-16 NOTE — Progress Notes (Signed)
Patient: Renee GuadeloupeCatherine C Lasalle Female    DOB: 10-29-1960   56 y.o.   MRN: [id number] Visit Date: 03/16/2017  Today's Provider: Jacelyn Pieah Doles-Johnson, NP   Chief Complaint  Patient presents with  . Diabetes  . Hypertension   Subjective:    HPI   DM:  Taking 70/30- 40 units BID.  CBG running 140-200.  A1c-9.6- minimal improvement from previous.  Pt states that her sugar was low earlier- first time in a while.  Pt states that she does try to monitor her diet.   HTN/HLD:  Taking medications as directed.  No myalgias.    Pt state that she is having increased swelling.  They do go down at night but swell throughout the day.  She has had swelling before and been on Lasix with good results.   Allergies  Allergen Reactions  . Etodolac Rash  . Penicillin G Rash  . Penicillins Rash   Previous Medications   ASPIRIN 81 MG TABLET    Take 81 mg by mouth daily.   FUROSEMIDE (LASIX) 20 MG TABLET    Take 1 tablet (20 mg total) by mouth daily.   GABAPENTIN (NEURONTIN) 400 MG CAPSULE    Take 1 capsule (400 mg total) by mouth 3 (three) times daily.   INSULIN ASPART PROTAMINE- ASPART (NOVOLOG MIX 70/30) (70-30) 100 UNIT/ML INJECTION    Inject 0.09 mLs (9 Units total) into the skin 2 (two) times daily with a meal.   LISINOPRIL (PRINIVIL,ZESTRIL) 10 MG TABLET    Take 1 tablet (10 mg total) by mouth daily.   METFORMIN (GLUCOPHAGE) 1000 MG TABLET    Take 1 tablet (1,000 mg total) by mouth 2 (two) times daily with a meal.   METOPROLOL TARTRATE (LOPRESSOR) 25 MG TABLET    Take 1 tablet (25 mg total) by mouth 2 (two) times daily.   METOPROLOL TARTRATE (LOPRESSOR) 50 MG TABLET    Take 1 tablet (50 mg total) by mouth 2 (two) times daily.   NAPROXEN (NAPROSYN) 500 MG TABLET    Take 1 tablet (500 mg total) by mouth 2 (two) times daily with a meal.   NITROGLYCERIN (NITROSTAT) 0.4 MG SL TABLET    Place 0.4 mg under the tongue every 5 (five) minutes as needed for chest pain.   NORTRIPTYLINE (PAMELOR) 25 MG  CAPSULE    Take 1 capsule (25 mg total) by mouth at bedtime. 1 to 2 capsules by mouth at bedtime   OMEGA-3 FATTY ACIDS (FISH OIL) 1000 MG CAPS    Take 1,000 mg by mouth 2 (two) times daily.   OMEPRAZOLE (PRILOSEC) 20 MG CAPSULE    Take 1 capsule (20 mg total) by mouth 2 (two) times daily.   PIOGLITAZONE (ACTOS) 30 MG TABLET    Take 1 tablet (30 mg total) by mouth daily.   ROSUVASTATIN (CRESTOR) 5 MG TABLET    Take 1 tablet (5 mg total) by mouth daily.    Review of Systems  All other systems reviewed and are negative.   Social History  Substance Use Topics  . Smoking status: Former Smoker    Quit date: 04/17/2005  . Smokeless tobacco: Never Used  . Alcohol use No     Comment: quit ~5863mo ago   Objective:   BP 129/85 (BP Location: Left Arm)   Pulse (!) 104   Wt 210 lb (95.3 kg)   BMI 38.41 kg/m   Physical Exam  Constitutional: She is oriented to person, place, and time. She appears well-developed  and well-nourished.  HENT:  Head: Normocephalic and atraumatic.  Eyes: Pupils are equal, round, and reactive to light.  Neck: Normal range of motion. Neck supple.  Cardiovascular: Normal rate, regular rhythm and normal heart sounds.   1+ pedal edema  Pulmonary/Chest: Effort normal and breath sounds normal.  Abdominal: Soft. Bowel sounds are normal.  Neurological: She is alert and oriented to person, place, and time.  Skin: Skin is warm and dry.        Assessment & Plan:         DM:  Goal <7.  Not controlled.  Encourage diabetic diet and exercise.  Continue current medication regimen.    HTN:  Controlled.  Goal BP <140/90.  Continue current medication regimen.  Encourage low salt diet and exercise.  Restart Lasix. K level 4.2.    HLD:  Controlled.   Continue current regimen.  Encourage low cholesterol, low fat diet and exercise.   Unable to use patient assistance due to husbands income.   B12 injection today.   Jacelyn Pi, NP   [facility]

## 2017-03-23 ENCOUNTER — Other Ambulatory Visit: Payer: Self-pay

## 2017-04-19 ENCOUNTER — Other Ambulatory Visit: Payer: Self-pay

## 2017-04-19 DIAGNOSIS — I10 Essential (primary) hypertension: Secondary | ICD-10-CM

## 2017-04-19 NOTE — Telephone Encounter (Addendum)
Received fax from [facility] requesting refill on metoprolol tartrate 25mg  oral 2 times daily.  Patient seen on 03/16/17 and noted that metoprolol tartrate was increased to 50mg  oral 2 times daily from previous 25mg  dosage. Patient's medication list shows current dosage as 75mg  with instructions to take 50mg  by mouth 2 times daily.  Noted on fax that 25mg  dosage discontinued and sent back to [facility].

## 2017-04-20 ENCOUNTER — Other Ambulatory Visit: Payer: Self-pay

## 2017-04-20 DIAGNOSIS — E538 Deficiency of other specified B group vitamins: Secondary | ICD-10-CM

## 2017-04-20 MED ORDER — CYANOCOBALAMIN 1000 MCG/ML IJ SOLN: 1000.0000 ug | Freq: Once | INTRAMUSCULAR | 0 refills | Status: DC

## 2017-04-20 MED ORDER — CYANOCOBALAMIN 1000 MCG/ML IJ SOLN
1000.0000 ug | Freq: Once | INTRAMUSCULAR | Status: AC
  Administered 2017-04-20: 1000 ug via INTRAMUSCULAR

## 2017-05-18 ENCOUNTER — Other Ambulatory Visit: Payer: Self-pay

## 2017-05-18 DIAGNOSIS — E538 Deficiency of other specified B group vitamins: Secondary | ICD-10-CM

## 2017-05-18 MED ORDER — CYANOCOBALAMIN 1000 MCG/ML IJ SOLN
1000.0000 ug | Freq: Once | INTRAMUSCULAR | Status: AC
  Administered 2017-05-18: 1000 ug via INTRAMUSCULAR

## 2017-06-22 ENCOUNTER — Other Ambulatory Visit: Payer: Self-pay

## 2017-06-22 DIAGNOSIS — I10 Essential (primary) hypertension: Secondary | ICD-10-CM

## 2017-06-22 DIAGNOSIS — E119 Type 2 diabetes mellitus without complications: Secondary | ICD-10-CM

## 2017-06-22 DIAGNOSIS — Z794 Long term (current) use of insulin: Secondary | ICD-10-CM

## 2017-06-22 DIAGNOSIS — E538 Deficiency of other specified B group vitamins: Secondary | ICD-10-CM

## 2017-06-22 DIAGNOSIS — E7800 Pure hypercholesterolemia, unspecified: Secondary | ICD-10-CM

## 2017-06-23 LAB — COMPREHENSIVE METABOLIC PANEL
ALK PHOS: 60 IU/L (ref 39–117)
ALT: 15 IU/L (ref 0–32)
AST: 12 IU/L (ref 0–40)
Albumin/Globulin Ratio: 1.7 (ref 1.2–2.2)
Albumin: 4 g/dL (ref 3.5–5.5)
BUN/Creatinine Ratio: 19 (ref 9–23)
BUN: 23 mg/dL (ref 6–24)
CHLORIDE: 103 mmol/L (ref 96–106)
CO2: 26 mmol/L (ref 20–29)
Calcium: 9.3 mg/dL (ref 8.7–10.2)
Creatinine, Ser: 1.2 mg/dL — ABNORMAL HIGH (ref 0.57–1.00)
GFR calc Af Amer: 58 mL/min/{1.73_m2} — ABNORMAL LOW (ref >59)
GFR calc non Af Amer: 51 mL/min/{1.73_m2} — ABNORMAL LOW (ref >59)
GLUCOSE: 185 mg/dL — AB (ref 65–99)
Globulin, Total: 2.4 g/dL (ref 1.5–4.5)
POTASSIUM: 5.4 mmol/L — AB (ref 3.5–5.2)
Sodium: 146 mmol/L — ABNORMAL HIGH (ref 134–144)
Total Protein: 6.4 g/dL (ref 6.0–8.5)

## 2017-06-23 LAB — CBC
HEMATOCRIT: 33.3 % — AB (ref 34.0–46.6)
HEMOGLOBIN: 10.8 g/dL — AB (ref 11.1–15.9)
MCH: 27.8 pg (ref 26.6–33.0)
MCHC: 32.4 g/dL (ref 31.5–35.7)
MCV: 86 fL (ref 79–97)
Platelets: 264 10*3/uL (ref 150–379)
RBC: 3.88 x10E6/uL (ref 3.77–5.28)
RDW: 16.2 % — AB (ref 12.3–15.4)
WBC: 6.2 10*3/uL (ref 3.4–10.8)

## 2017-06-23 LAB — HEMOGLOBIN A1C
Est. average glucose Bld gHb Est-mCnc: 197 mg/dL
Hgb A1c MFr Bld: 8.5 % — ABNORMAL HIGH (ref 4.8–5.6)

## 2017-06-23 LAB — LIPID PANEL
CHOLESTEROL TOTAL: 140 mg/dL (ref 100–199)
Chol/HDL Ratio: 3.4 ratio (ref 0.0–4.4)
HDL: 41 mg/dL (ref >39)
LDL Calculated: 64 mg/dL (ref 0–99)
Triglycerides: 174 mg/dL — ABNORMAL HIGH (ref 0–149)
VLDL Cholesterol Cal: 35 mg/dL (ref 5–40)

## 2017-06-23 LAB — VITAMIN B12: Vitamin B-12: 556 pg/mL (ref 232–1245)

## 2017-06-29 ENCOUNTER — Ambulatory Visit: Payer: Self-pay | Admitting: Adult Health Nurse Practitioner

## 2017-06-29 DIAGNOSIS — I10 Essential (primary) hypertension: Secondary | ICD-10-CM

## 2017-06-29 DIAGNOSIS — E1140 Type 2 diabetes mellitus with diabetic neuropathy, unspecified: Secondary | ICD-10-CM

## 2017-06-29 DIAGNOSIS — Z794 Long term (current) use of insulin: Secondary | ICD-10-CM

## 2017-06-29 DIAGNOSIS — E7800 Pure hypercholesterolemia, unspecified: Secondary | ICD-10-CM

## 2017-06-29 MED ORDER — NORTRIPTYLINE HCL 25 MG PO CAPS: 25.0000 mg | ORAL_CAPSULE | Freq: Every day | ORAL | 3 refills | Status: DC

## 2017-06-29 MED ORDER — METFORMIN HCL 1000 MG PO TABS: 1000.0000 mg | ORAL_TABLET | Freq: Two times a day (BID) | ORAL | 1 refills | Status: DC

## 2017-06-29 MED ORDER — METOPROLOL TARTRATE 75 MG PO TABS: 75.0000 mg | ORAL_TABLET | Freq: Two times a day (BID) | ORAL | 1 refills | Status: DC

## 2017-06-29 MED ORDER — FUROSEMIDE 20 MG PO TABS: 20.0000 mg | ORAL_TABLET | Freq: Every day | ORAL | 1 refills | Status: DC

## 2017-06-29 MED ORDER — PIOGLITAZONE HCL 30 MG PO TABS: 30.0000 mg | ORAL_TABLET | Freq: Every day | ORAL | 4 refills | Status: DC

## 2017-06-29 MED ORDER — ROSUVASTATIN CALCIUM 5 MG PO TABS: 5.0000 mg | ORAL_TABLET | Freq: Every day | ORAL | 7 refills | Status: DC

## 2017-06-29 NOTE — Progress Notes (Signed)
Patient: Elta GuadeloupeCatherine C Shryock Female    DOB: Jan 15, 1961   56 y.o.   MRN: [id number] Visit Date: 06/29/2017  Today's Provider: Jacelyn Pieah Doles-Johnson, NP   Chief Complaint  Patient presents with  . Follow-up   Subjective:    HPI   DM:  A1c down to 8.5 from 9.6.  Pt states that she is trying to modify her diet.  Taking medications as directed.  Taking 70/30 40 units BID.  CBGs average 120-190.   HTN/HLD:  Taking medications as directed.   Reviewed labs.     Allergies  Allergen Reactions  . Etodolac Rash  . Penicillin G Rash  . Penicillins Rash   Previous Medications   ASPIRIN 81 MG TABLET    Take 81 mg by mouth daily.   FUROSEMIDE (LASIX) 20 MG TABLET    Take 1 tablet (20 mg total) by mouth daily.   GABAPENTIN (NEURONTIN) 400 MG CAPSULE    Take 1 capsule (400 mg total) by mouth 3 (three) times daily.   INSULIN ASPART PROTAMINE- ASPART (NOVOLOG MIX 70/30) (70-30) 100 UNIT/ML INJECTION    Inject 0.09 mLs (9 Units total) into the skin 2 (two) times daily with a meal.   LISINOPRIL (PRINIVIL,ZESTRIL) 10 MG TABLET    Take 1 tablet (10 mg total) by mouth daily.   METFORMIN (GLUCOPHAGE) 1000 MG TABLET    Take 1 tablet (1,000 mg total) by mouth 2 (two) times daily with a meal.   METOPROLOL TARTRATE 75 MG TABS    Take 50 mg by mouth 2 (two) times daily.   NITROGLYCERIN (NITROSTAT) 0.4 MG SL TABLET    Place 0.4 mg under the tongue every 5 (five) minutes as needed for chest pain.   NORTRIPTYLINE (PAMELOR) 25 MG CAPSULE    Take 1 capsule (25 mg total) by mouth at bedtime. 1 to 2 capsules by mouth at bedtime   OMEGA-3 FATTY ACIDS (FISH OIL) 1000 MG CAPS    Take 1,000 mg by mouth 2 (two) times daily.   OMEPRAZOLE (PRILOSEC) 20 MG CAPSULE    Take 1 capsule (20 mg total) by mouth 2 (two) times daily.   PIOGLITAZONE (ACTOS) 30 MG TABLET    Take 1 tablet (30 mg total) by mouth daily.   ROSUVASTATIN (CRESTOR) 5 MG TABLET    Take 1 tablet (5 mg total) by mouth daily.    Review of Systems  All  other systems reviewed and are negative.   Social History  Substance Use Topics  . Smoking status: Former Smoker    Quit date: 04/17/2005  . Smokeless tobacco: Never Used  . Alcohol use No     Comment: quit ~4754mo ago   Objective:   BP 110/68 (BP Location: Left Arm)   Pulse (!) 108   Temp 98.1 F (36.7 C)   Ht 5\' 2"  (1.575 m)   Wt 205 lb 14.4 oz (93.4 kg)   BMI 37.66 kg/m   Physical Exam      Assessment & Plan:     Cut back on potassium rich foods- K level was 5.4  DM:  Improved. Not quite at goal.   Encourage diabetic diet and exercise.  Continue current medication regimen.  Continue to check CBG BID.  HTN:  Controlled.  Goal BP <140/90.  Continue current medication regimen.  Encourage low salt diet and exercise.   HLD:  Controlled.   Continue current regimen.  Encourage low cholesterol, low fat diet and exercise.  Staci Acosta, NP   [facility]

## 2017-07-27 ENCOUNTER — Other Ambulatory Visit: Payer: Self-pay

## 2017-07-27 DIAGNOSIS — E538 Deficiency of other specified B group vitamins: Secondary | ICD-10-CM

## 2017-07-27 MED ORDER — CYANOCOBALAMIN 1000 MCG/ML IJ SOLN
1000.0000 ug | Freq: Once | INTRAMUSCULAR | Status: AC
  Administered 2017-10-26: 1000 ug via INTRAMUSCULAR

## 2017-08-24 ENCOUNTER — Other Ambulatory Visit: Payer: Self-pay

## 2017-08-24 ENCOUNTER — Other Ambulatory Visit: Payer: Self-pay | Admitting: Adult Health Nurse Practitioner

## 2017-08-24 DIAGNOSIS — E538 Deficiency of other specified B group vitamins: Secondary | ICD-10-CM

## 2017-08-24 NOTE — Telephone Encounter (Signed)
Patient is requesting refill on nortriptyline HCL 25mg .  Pharmacy of choice is [facility].

## 2017-09-07 ENCOUNTER — Ambulatory Visit: Payer: Self-pay | Admitting: Ophthalmology

## 2017-09-07 ENCOUNTER — Other Ambulatory Visit: Payer: Self-pay | Admitting: Ophthalmology

## 2017-09-07 LAB — HM DIABETES EYE EXAM

## 2017-09-28 ENCOUNTER — Other Ambulatory Visit: Payer: Self-pay

## 2017-09-28 ENCOUNTER — Telehealth: Payer: Self-pay | Admitting: Family Medicine

## 2017-09-28 DIAGNOSIS — E1140 Type 2 diabetes mellitus with diabetic neuropathy, unspecified: Secondary | ICD-10-CM

## 2017-09-28 DIAGNOSIS — Z794 Long term (current) use of insulin: Principal | ICD-10-CM

## 2017-09-28 MED ORDER — CYANOCOBALAMIN 1000 MCG/ML IJ SOLN
1000.0000 ug | Freq: Once | INTRAMUSCULAR | Status: AC
  Administered 2018-07-05: 1000 ug via INTRAMUSCULAR

## 2017-09-28 NOTE — Telephone Encounter (Signed)
Rx for Renee Bush was called in to [facility] on 07/2017 with 3 refills. [facility] has since closed. Spoke to patient and she has enough pills until she comes in next week for new Rx to be sent to a different pharmacy.

## 2017-09-29 LAB — COMPREHENSIVE METABOLIC PANEL
A/G RATIO: 1.8 (ref 1.2–2.2)
ALBUMIN: 4.2 g/dL (ref 3.5–5.5)
ALT: 20 IU/L (ref 0–32)
AST: 18 IU/L (ref 0–40)
Alkaline Phosphatase: 72 IU/L (ref 39–117)
BUN / CREAT RATIO: 14 (ref 9–23)
BUN: 16 mg/dL (ref 6–24)
CHLORIDE: 102 mmol/L (ref 96–106)
CO2: 26 mmol/L (ref 20–29)
Calcium: 9.5 mg/dL (ref 8.7–10.2)
Creatinine, Ser: 1.14 mg/dL — ABNORMAL HIGH (ref 0.57–1.00)
GFR, EST AFRICAN AMERICAN: 62 mL/min/{1.73_m2} (ref >59)
GFR, EST NON AFRICAN AMERICAN: 54 mL/min/{1.73_m2} — AB (ref >59)
GLUCOSE: 64 mg/dL — AB (ref 65–99)
Globulin, Total: 2.4 g/dL (ref 1.5–4.5)
Potassium: 5.2 mmol/L (ref 3.5–5.2)
Sodium: 144 mmol/L (ref 134–144)
TOTAL PROTEIN: 6.6 g/dL (ref 6.0–8.5)

## 2017-09-29 LAB — HEMOGLOBIN A1C
Est. average glucose Bld gHb Est-mCnc: 217 mg/dL
Hgb A1c MFr Bld: 9.2 % — ABNORMAL HIGH (ref 4.8–5.6)

## 2017-10-05 ENCOUNTER — Ambulatory Visit: Payer: Self-pay | Admitting: Family Medicine

## 2017-10-05 VITALS — BP 129/84 | HR 84 | Temp 98.3°F | Wt 218.1 lb

## 2017-10-05 DIAGNOSIS — K219 Gastro-esophageal reflux disease without esophagitis: Secondary | ICD-10-CM

## 2017-10-05 DIAGNOSIS — I10 Essential (primary) hypertension: Secondary | ICD-10-CM

## 2017-10-05 DIAGNOSIS — G4700 Insomnia, unspecified: Secondary | ICD-10-CM

## 2017-10-05 DIAGNOSIS — Z8349 Family history of other endocrine, nutritional and metabolic diseases: Secondary | ICD-10-CM

## 2017-10-05 DIAGNOSIS — N183 Chronic kidney disease, stage 3 unspecified: Secondary | ICD-10-CM

## 2017-10-05 DIAGNOSIS — E1140 Type 2 diabetes mellitus with diabetic neuropathy, unspecified: Secondary | ICD-10-CM

## 2017-10-05 DIAGNOSIS — E7800 Pure hypercholesterolemia, unspecified: Secondary | ICD-10-CM

## 2017-10-05 DIAGNOSIS — R635 Abnormal weight gain: Secondary | ICD-10-CM

## 2017-10-05 DIAGNOSIS — Z794 Long term (current) use of insulin: Secondary | ICD-10-CM

## 2017-10-05 MED ORDER — GABAPENTIN 400 MG PO CAPS: 400.0000 mg | ORAL_CAPSULE | Freq: Three times a day (TID) | ORAL | 1 refills | Status: DC

## 2017-10-05 MED ORDER — INSULIN ASPART PROT & ASPART (70-30 MIX) 100 UNIT/ML ~~LOC~~ SUSP: SUBCUTANEOUS | 11 refills | Status: DC

## 2017-10-05 MED ORDER — LISINOPRIL 10 MG PO TABS: 10.0000 mg | ORAL_TABLET | Freq: Every day | ORAL | 1 refills | Status: DC

## 2017-10-05 MED ORDER — METFORMIN HCL 1000 MG PO TABS: 1000.0000 mg | ORAL_TABLET | Freq: Two times a day (BID) | ORAL | 1 refills | Status: DC

## 2017-10-05 MED ORDER — OMEPRAZOLE 20 MG PO CPDR: 20.0000 mg | DELAYED_RELEASE_CAPSULE | Freq: Every day | ORAL | 0 refills | Status: DC

## 2017-10-05 MED ORDER — PIOGLITAZONE HCL 30 MG PO TABS: 30.0000 mg | ORAL_TABLET | Freq: Every day | ORAL | 1 refills | Status: DC

## 2017-10-05 MED ORDER — FUROSEMIDE 20 MG PO TABS: 20.0000 mg | ORAL_TABLET | Freq: Every day | ORAL | 1 refills | Status: DC

## 2017-10-05 MED ORDER — ROSUVASTATIN CALCIUM 5 MG PO TABS: 5.0000 mg | ORAL_TABLET | Freq: Every day | ORAL | 1 refills | Status: DC

## 2017-10-05 MED ORDER — METOPROLOL TARTRATE 75 MG PO TABS: 75.0000 mg | ORAL_TABLET | Freq: Two times a day (BID) | ORAL | 1 refills | Status: DC

## 2017-10-05 MED ORDER — NITROGLYCERIN 0.4 MG SL SUBL: 0.4000 mg | SUBLINGUAL_TABLET | SUBLINGUAL | 3 refills | Status: DC | PRN

## 2017-10-05 NOTE — Patient Instructions (Addendum)
Avoid salt substitutes Increase the insulin in the morning by four units for one week; if sugars still high after one week, then go up another 3-4 units in the morning Let us know if sugars not controlled after that If you need something for aches or pains, try to use Tylenol (acetaminophen) instead of non-steroidals (which include Aleve, ibuprofen, Advil, Motrin, and naproxen); non-steroidals can cause long-term kidney damage Caution: prolonged use of proton pump inhibitors like omeprazole (Prilosec), pantoprazole (Protonix), esomeprazole (Nexium), and others like Dexilant and Aciphex may increase your risk of pneumonia, Clostridium difficile colitis, osteoporosis, anemia and other health complications Try to limit or avoid triggers like coffee, caffeinated beverages, onions, chocolate, spicy foods, peppermint, acidic foods like pizza, spaghetti sauce, and orange juice Lose weight if you are overweight or obese Try elevating the head of your bed by placing a small wedge between your mattress and box springs to keep acid in the stomach at night instead of coming up into your esophagus Only take the omeprazole once a day We'll check the labs tonight to see if your thyroid is the problem

## 2017-10-05 NOTE — Progress Notes (Signed)
BP 129/84 (BP Location: Left Arm, Patient Position: Sitting, Cuff Size: Small)   Pulse 84   Temp 98.3 F (36.8 C)   Wt 218 lb 1.6 oz (98.9 kg)   BMI 39.89 kg/m    Subjective:    Patient ID: Renee Bush, female    DOB: 07-Mar-1961, 57 y.o.   MRN: [id number]  HPI: Renee Bush is a 57 y.o. female  Chief Complaint  Patient presents with  . Follow-up    HPI  Patient is here for follow-up, lab results Type 2 diabetes Taking the 70/30  40 units BID Taking metformin BID as well Pioglitazone 30 mg daily Checking sugars now and then; 150-160, sometimes higher; one time they were so high, 300 range, but down with the pioglitazone Has put some weight on; since  Eating has stayed the same; trouble losing weight Sister had thyroid issues  BP not bad  pamelor two pills at night for sleep  Omeprazole twice a day, no blood in the stool, no abd pain  CKD stage 3, good water; sometimes uses ibuprofen, just once in awhile  No flowsheet data found.  Relevant past medical, surgical, family and social history reviewed Past Medical History:  Diagnosis Date  . Chronic kidney disease, stage III (moderate) (HCC) 10/06/2017  . Diabetes mellitus without complication (HCC)   . GERD (gastroesophageal reflux disease)   . Hypercholesteremia   . Hypertension 09/10/2015   Past Surgical History:  Procedure Laterality Date  . APPENDECTOMY    . CESAREAN SECTION  1991   Family History  Problem Relation Age of Onset  . COPD Mother   . Alzheimer's disease Mother   . Hypertension Father   . Hyperlipidemia Father   . Heart attack Father   . Diabetes type II Father   . Diabetes type II Sister   . Gestational diabetes Sister    Social History   Tobacco Use  . Smoking status: Former Smoker    Last attempt to quit: 04/17/2005    Years since quitting: 12.4  . Smokeless tobacco: Never Used  Substance Use Topics  . Alcohol use: No    Alcohol/week: 8.4 oz    Types: 14 Cans of  beer per week    Comment: quit ~90mo ago  . Drug use: No    Interim medical history since last visit reviewed. Allergies and medications reviewed  Review of Systems Per HPI unless specifically indicated above     Objective:    BP 129/84 (BP Location: Left Arm, Patient Position: Sitting, Cuff Size: Small)   Pulse 84   Temp 98.3 F (36.8 C)   Wt 218 lb 1.6 oz (98.9 kg)   BMI 39.89 kg/m   Wt Readings from Last 3 Encounters:  10/05/17 218 lb 1.6 oz (98.9 kg)  06/29/17 205 lb 14.4 oz (93.4 kg)  03/16/17 210 lb (95.3 kg)    Physical Exam  Constitutional: She appears well-developed and well-nourished. No distress.  HENT:  Head: Normocephalic and atraumatic.  Eyes: EOM are normal. No scleral icterus.  Neck: No thyromegaly present.  Cardiovascular: Normal rate, regular rhythm and normal heart sounds.  No murmur heard. Pulmonary/Chest: Effort normal and breath sounds normal. No respiratory distress. She has no wheezes.  Abdominal: Soft. Bowel sounds are normal. She exhibits no distension.  Musculoskeletal: Normal range of motion. She exhibits no edema.  Neurological: She is alert. She exhibits normal muscle tone.  Skin: Skin is warm and dry. She is not diaphoretic. No pallor.  Psychiatric: She has a normal mood and affect. Her behavior is normal. Judgment and thought content normal.      Assessment & Plan:   Problem List Items Addressed This Visit      Cardiovascular and Mediastinum   Hypertension    Good control      Relevant Medications   aspirin EC 81 MG tablet   nitroGLYCERIN (NITROSTAT) 0.4 MG SL tablet   furosemide (LASIX) 20 MG tablet   lisinopril (PRINIVIL,ZESTRIL) 10 MG tablet   Metoprolol Tartrate 75 MG TABS   rosuvastatin (CRESTOR) 5 MG tablet     Digestive   GERD (gastroesophageal reflux disease)    Discussed risk of long-term use of PPIs; avoid triggers; weight loss should help      Relevant Medications   omeprazole (PRILOSEC) 20 MG capsule      Endocrine   Type 2 diabetes mellitus with diabetic neuropathy, with long-term current use of insulin (HCC)    Uncontrolled; will increase the morning 70/30 dose; explained we usually like to give about 2/3 of the daily dose in the morning and 1/3 of the dose in the evening; will gradually increase the morning dose, 4 units first, and then another 3-4 units after a week to get FSBS under 150; continue other meds; check FSBS TID      Relevant Medications   aspirin EC 81 MG tablet   insulin aspart protamine- aspart (NOVOLOG MIX 70/30) (70-30) 100 UNIT/ML injection   lisinopril (PRINIVIL,ZESTRIL) 10 MG tablet   metFORMIN (GLUCOPHAGE) 1000 MG tablet   pioglitazone (ACTOS) 30 MG tablet   rosuvastatin (CRESTOR) 5 MG tablet     Genitourinary   Chronic kidney disease, stage III (moderate) (HCC)    Explained findings based on lab results; avoid NSAIDs, stay hydrated        Other   Insomnia    Advised her to wean off of the TCA; she'll go down to one pill for 1-2 weeks, and then stop; TCAs can cause weight gain; no BBB on last EKG      Hypercholesteremia    LDL is at goal, less than 70      Relevant Medications   aspirin EC 81 MG tablet   nitroGLYCERIN (NITROSTAT) 0.4 MG SL tablet   furosemide (LASIX) 20 MG tablet   lisinopril (PRINIVIL,ZESTRIL) 10 MG tablet   Metoprolol Tartrate 75 MG TABS   rosuvastatin (CRESTOR) 5 MG tablet    Other Visit Diagnoses    Abnormal weight gain    -  Primary   Relevant Orders   TSH   T4, free (Completed)   Family history of thyroid problem       Relevant Orders   TSH   T4, free (Completed)       Follow up plan: Return in about 3 months (around 01/02/2018) for follow-up visit for diabetes.  An after-visit summary was printed and given to the patient at check-out.  Please see the patient instructions which may contain other information and recommendations beyond what is mentioned above in the assessment and plan.  Meds ordered this encounter   Medications  . nitroGLYCERIN (NITROSTAT) 0.4 MG SL tablet    Sig: Place 1 tablet (0.4 mg total) under the tongue every 5 (five) minutes as needed for chest pain.    Dispense:  25 tablet    Refill:  3  . insulin aspart protamine- aspart (NOVOLOG MIX 70/30) (70-30) 100 UNIT/ML injection    Sig: Forty-four units in the morning with breakfast,  and forty in the evening with supper    Dispense:  10 mL    Refill:  11  . furosemide (LASIX) 20 MG tablet    Sig: Take 1 tablet (20 mg total) by mouth daily.    Dispense:  90 tablet    Refill:  1  . gabapentin (NEURONTIN) 400 MG capsule    Sig: Take 1 capsule (400 mg total) by mouth 3 (three) times daily.    Dispense:  270 capsule    Refill:  1  . lisinopril (PRINIVIL,ZESTRIL) 10 MG tablet    Sig: Take 1 tablet (10 mg total) by mouth daily.    Dispense:  90 tablet    Refill:  1  . metFORMIN (GLUCOPHAGE) 1000 MG tablet    Sig: Take 1 tablet (1,000 mg total) by mouth 2 (two) times daily with a meal.    Dispense:  180 tablet    Refill:  1  . Metoprolol Tartrate 75 MG TABS    Sig: Take 75 mg by mouth 2 (two) times daily.    Dispense:  180 tablet    Refill:  1  . omeprazole (PRILOSEC) 20 MG capsule    Sig: Take 1 capsule (20 mg total) by mouth daily.    Dispense:  90 capsule    Refill:  0  . pioglitazone (ACTOS) 30 MG tablet    Sig: Take 1 tablet (30 mg total) by mouth daily.    Dispense:  90 tablet    Refill:  1  . rosuvastatin (CRESTOR) 5 MG tablet    Sig: Take 1 tablet (5 mg total) by mouth daily.    Dispense:  90 tablet    Refill:  1    Orders Placed This Encounter  Procedures  . TSH  . T4, free

## 2017-10-06 ENCOUNTER — Encounter: Payer: Self-pay | Admitting: Family Medicine

## 2017-10-06 DIAGNOSIS — G4700 Insomnia, unspecified: Secondary | ICD-10-CM | POA: Insufficient documentation

## 2017-10-06 DIAGNOSIS — N183 Chronic kidney disease, stage 3 unspecified: Secondary | ICD-10-CM

## 2017-10-06 DIAGNOSIS — K219 Gastro-esophageal reflux disease without esophagitis: Secondary | ICD-10-CM | POA: Insufficient documentation

## 2017-10-06 HISTORY — DX: Chronic kidney disease, stage 3 unspecified: N18.30

## 2017-10-06 LAB — T4, FREE: Free T4: 1.03 ng/dL (ref 0.82–1.77)

## 2017-10-06 NOTE — Assessment & Plan Note (Signed)
Uncontrolled; will increase the morning 70/30 dose; explained we usually like to give about 2/3 of the daily dose in the morning and 1/3 of the dose in the evening; will gradually increase the morning dose, 4 units first, and then another 3-4 units after a week to get FSBS under 150; continue other meds; check FSBS TID

## 2017-10-06 NOTE — Assessment & Plan Note (Signed)
Explained findings based on lab results; avoid NSAIDs, stay hydrated

## 2017-10-06 NOTE — Assessment & Plan Note (Addendum)
Advised her to wean off of the TCA; she'll go down to one pill for 1-2 weeks, and then stop; TCAs can cause weight gain; no BBB on last EKG

## 2017-10-06 NOTE — Assessment & Plan Note (Signed)
Good control

## 2017-10-06 NOTE — Assessment & Plan Note (Signed)
Discussed risk of long-term use of PPIs; avoid triggers; weight loss should help

## 2017-10-06 NOTE — Assessment & Plan Note (Signed)
LDL is at goal, less than 70

## 2017-10-26 ENCOUNTER — Other Ambulatory Visit: Payer: Self-pay

## 2017-10-30 ENCOUNTER — Other Ambulatory Visit: Payer: Self-pay

## 2017-10-30 DIAGNOSIS — I10 Essential (primary) hypertension: Principal | ICD-10-CM

## 2017-10-30 DIAGNOSIS — E1159 Type 2 diabetes mellitus with other circulatory complications: Secondary | ICD-10-CM

## 2017-10-30 MED ORDER — FUROSEMIDE 20 MG PO TABS: 20.0000 mg | ORAL_TABLET | Freq: Every day | ORAL | 1 refills | Status: DC

## 2017-11-09 ENCOUNTER — Ambulatory Visit: Payer: Self-pay | Admitting: Adult Health Nurse Practitioner

## 2017-11-09 VITALS — BP 129/84 | HR 100 | Temp 98.4°F | Wt 210.0 lb

## 2017-11-09 DIAGNOSIS — E1159 Type 2 diabetes mellitus with other circulatory complications: Secondary | ICD-10-CM

## 2017-11-09 DIAGNOSIS — E1140 Type 2 diabetes mellitus with diabetic neuropathy, unspecified: Secondary | ICD-10-CM

## 2017-11-09 DIAGNOSIS — R109 Unspecified abdominal pain: Secondary | ICD-10-CM | POA: Insufficient documentation

## 2017-11-09 DIAGNOSIS — E7800 Pure hypercholesterolemia, unspecified: Secondary | ICD-10-CM

## 2017-11-09 DIAGNOSIS — K219 Gastro-esophageal reflux disease without esophagitis: Secondary | ICD-10-CM

## 2017-11-09 DIAGNOSIS — Z794 Long term (current) use of insulin: Secondary | ICD-10-CM

## 2017-11-09 DIAGNOSIS — R110 Nausea: Secondary | ICD-10-CM | POA: Insufficient documentation

## 2017-11-09 DIAGNOSIS — I10 Essential (primary) hypertension: Secondary | ICD-10-CM

## 2017-11-09 DIAGNOSIS — R1084 Generalized abdominal pain: Secondary | ICD-10-CM

## 2017-11-09 MED ORDER — FUROSEMIDE 20 MG PO TABS: 20.0000 mg | ORAL_TABLET | Freq: Every day | ORAL | 1 refills | Status: DC

## 2017-11-09 MED ORDER — ONDANSETRON HCL 4 MG PO TABS: 4.0000 mg | ORAL_TABLET | Freq: Three times a day (TID) | ORAL | 0 refills | Status: DC | PRN

## 2017-11-09 MED ORDER — LISINOPRIL 10 MG PO TABS: 10.0000 mg | ORAL_TABLET | Freq: Every day | ORAL | 1 refills | Status: DC

## 2017-11-09 MED ORDER — METFORMIN HCL 1000 MG PO TABS: 1000.0000 mg | ORAL_TABLET | Freq: Two times a day (BID) | ORAL | 1 refills | Status: DC

## 2017-11-09 MED ORDER — OMEPRAZOLE 20 MG PO CPDR: 20.0000 mg | DELAYED_RELEASE_CAPSULE | Freq: Every day | ORAL | 1 refills | Status: DC

## 2017-11-09 MED ORDER — ROSUVASTATIN CALCIUM 5 MG PO TABS: 5.0000 mg | ORAL_TABLET | Freq: Every day | ORAL | 1 refills | Status: DC

## 2017-11-09 MED ORDER — PIOGLITAZONE HCL 30 MG PO TABS: 30.0000 mg | ORAL_TABLET | Freq: Every day | ORAL | 1 refills | Status: DC

## 2017-11-09 NOTE — Progress Notes (Signed)
Subjective:    Patient ID: Renee Bush, female    DOB: November 05, 1960, 57 y.o.   MRN: [id number]  HPI  Renee Bush is a 57 yo female here for upset stomach and nausea for a month and a half. Pt reports when she eats she has pain in abdominal area and nausea. Pt denies vomiting. She still has her gallbladder. She reports she takes meds for GERD regularly. She had an appendectomy.  She reports at last visit TSH labs were ordered but she did not receive results.    Patient Active Problem List   Diagnosis Date Noted  . Insomnia 10/06/2017  . GERD (gastroesophageal reflux disease) 10/06/2017  . Chronic kidney disease, stage III (moderate) (HCC) 10/06/2017  . B12 deficiency 06/09/2016  . Hypercholesteremia 05/05/2016  . Type 2 diabetes mellitus with diabetic neuropathy, with long-term current use of insulin (HCC) 09/10/2015  . Hypertension 09/10/2015  . ETOH abuse 04/18/2015  . Fatigue 04/18/2015   Allergies as of 11/09/2017      Reactions   Etodolac Rash   Penicillin G Rash   Penicillins Rash      Medication List        Accurate as of 11/09/17  6:24 PM. Always use your most recent med list.          aspirin EC 81 MG tablet Take 1 tablet (81 mg total) by mouth daily.   Fish Oil 1000 MG Caps Take 1,000 mg by mouth 2 (two) times daily.   furosemide 20 MG tablet Commonly known as:  LASIX Take 1 tablet (20 mg total) by mouth daily.   gabapentin 400 MG capsule Commonly known as:  NEURONTIN Take 1 capsule (400 mg total) by mouth 3 (three) times daily.   insulin aspart protamine- aspart (70-30) 100 UNIT/ML injection Commonly known as:  NOVOLOG MIX 70/30 Forty-four units in the morning with breakfast, and forty in the evening with supper   lisinopril 10 MG tablet Commonly known as:  PRINIVIL,ZESTRIL Take 1 tablet (10 mg total) by mouth daily.   metFORMIN 1000 MG tablet Commonly known as:  GLUCOPHAGE Take 1 tablet (1,000 mg total) by mouth 2 (two) times daily  with a meal.   Metoprolol Tartrate 75 MG Tabs Take 75 mg by mouth 2 (two) times daily.   nitroGLYCERIN 0.4 MG SL tablet Commonly known as:  NITROSTAT Place 1 tablet (0.4 mg total) under the tongue every 5 (five) minutes as needed for chest pain.   nortriptyline 25 MG capsule Commonly known as:  PAMELOR 1 capsule (25 mg total) at bedtime. For two weeks, then stop   omeprazole 20 MG capsule Commonly known as:  PRILOSEC Take 1 capsule (20 mg total) by mouth daily.   pioglitazone 30 MG tablet Commonly known as:  ACTOS Take 1 tablet (30 mg total) by mouth daily.   rosuvastatin 5 MG tablet Commonly known as:  CRESTOR Take 1 tablet (5 mg total) by mouth daily.        Review of Systems  Gastrointestinal: Positive for abdominal distention and nausea.   TSH order was not completed.     Objective:   Physical Exam  Constitutional: She is oriented to person, place, and time. She appears well-developed and well-nourished.  Cardiovascular: Normal rate, regular rhythm and normal heart sounds.  Pulmonary/Chest: Effort normal and breath sounds normal.  Abdominal: Soft. Bowel sounds are normal. She exhibits no distension. There is tenderness in the right lower quadrant.  Neurological: She is alert and oriented  to person, place, and time.  Vitals reviewed.   BP 129/84 (BP Location: Left Arm, Patient Position: Sitting, Cuff Size: Normal)   Pulse 100   Temp 98.4 F (36.9 C)   Wt 210 lb (95.3 kg)   BMI 38.41 kg/m        Assessment & Plan:   Abdominal pain: Gave pt Zantac samples to take at night for 14 days. Ordered Zofran prn. Continue Prilosec. Ordered Ultrasound. Labs tonight: A,ylase, Lipase and CMET. Complete TSH.   F/u in 3 weeks to review [place]

## 2017-11-10 LAB — COMPREHENSIVE METABOLIC PANEL
ALT: 19 IU/L (ref 0–32)
AST: 14 IU/L (ref 0–40)
Albumin/Globulin Ratio: 1.7 (ref 1.2–2.2)
Albumin: 4.1 g/dL (ref 3.5–5.5)
Alkaline Phosphatase: 66 IU/L (ref 39–117)
BUN/Creatinine Ratio: 13 (ref 9–23)
BUN: 16 mg/dL (ref 6–24)
Bilirubin Total: <0.2 mg/dL (ref 0.0–1.2)
CALCIUM: 9.5 mg/dL (ref 8.7–10.2)
CO2: 23 mmol/L (ref 20–29)
CREATININE: 1.19 mg/dL — AB (ref 0.57–1.00)
Chloride: 101 mmol/L (ref 96–106)
GFR, EST AFRICAN AMERICAN: 59 mL/min/{1.73_m2} — AB (ref >59)
GFR, EST NON AFRICAN AMERICAN: 51 mL/min/{1.73_m2} — AB (ref >59)
Globulin, Total: 2.4 g/dL (ref 1.5–4.5)
Glucose: 250 mg/dL — ABNORMAL HIGH (ref 65–99)
Potassium: 4.3 mmol/L (ref 3.5–5.2)
Sodium: 140 mmol/L (ref 134–144)
TOTAL PROTEIN: 6.5 g/dL (ref 6.0–8.5)

## 2017-11-10 LAB — TSH: TSH: 0.763 u[IU]/mL (ref 0.450–4.500)

## 2017-11-10 LAB — AMYLASE: Amylase: 43 U/L (ref 31–124)

## 2017-11-10 LAB — LIPASE: LIPASE: 44 U/L (ref 14–72)

## 2017-12-02 ENCOUNTER — Emergency Department: Payer: PRIVATE HEALTH INSURANCE

## 2017-12-02 ENCOUNTER — Other Ambulatory Visit: Payer: Self-pay

## 2017-12-02 ENCOUNTER — Emergency Department
Admission: EM | Admit: 2017-12-02 | Discharge: 2017-12-02 | Disposition: A | Discharge status: Home / Self Care | Payer: PRIVATE HEALTH INSURANCE | Attending: Emergency Medicine | Admitting: Emergency Medicine

## 2017-12-02 DIAGNOSIS — Y929 Unspecified place or not applicable: Secondary | ICD-10-CM | POA: Insufficient documentation

## 2017-12-02 DIAGNOSIS — R55 Syncope and collapse: Secondary | ICD-10-CM | POA: Insufficient documentation

## 2017-12-02 DIAGNOSIS — S8001XA Contusion of right knee, initial encounter: Secondary | ICD-10-CM

## 2017-12-02 DIAGNOSIS — W108XXA Fall (on) (from) other stairs and steps, initial encounter: Secondary | ICD-10-CM | POA: Insufficient documentation

## 2017-12-02 DIAGNOSIS — Y9301 Activity, walking, marching and hiking: Secondary | ICD-10-CM | POA: Insufficient documentation

## 2017-12-02 DIAGNOSIS — E1122 Type 2 diabetes mellitus with diabetic chronic kidney disease: Secondary | ICD-10-CM | POA: Insufficient documentation

## 2017-12-02 DIAGNOSIS — I129 Hypertensive chronic kidney disease with stage 1 through stage 4 chronic kidney disease, or unspecified chronic kidney disease: Secondary | ICD-10-CM | POA: Insufficient documentation

## 2017-12-02 DIAGNOSIS — Z87891 Personal history of nicotine dependence: Secondary | ICD-10-CM | POA: Insufficient documentation

## 2017-12-02 DIAGNOSIS — N183 Chronic kidney disease, stage 3 (moderate): Secondary | ICD-10-CM | POA: Insufficient documentation

## 2017-12-02 DIAGNOSIS — S40021A Contusion of right upper arm, initial encounter: Secondary | ICD-10-CM | POA: Insufficient documentation

## 2017-12-02 DIAGNOSIS — Y999 Unspecified external cause status: Secondary | ICD-10-CM | POA: Insufficient documentation

## 2017-12-02 DIAGNOSIS — S20219A Contusion of unspecified front wall of thorax, initial encounter: Secondary | ICD-10-CM | POA: Insufficient documentation

## 2017-12-02 DIAGNOSIS — Z794 Long term (current) use of insulin: Secondary | ICD-10-CM | POA: Insufficient documentation

## 2017-12-02 LAB — CBC
HCT: 35.5 % (ref 35.0–47.0)
Hemoglobin: 11.3 g/dL — ABNORMAL LOW (ref 12.0–16.0)
MCH: 26.7 pg (ref 26.0–34.0)
MCHC: 31.8 g/dL — AB (ref 32.0–36.0)
MCV: 84 fL (ref 80.0–100.0)
PLATELETS: 229 10*3/uL (ref 150–440)
RBC: 4.22 MIL/uL (ref 3.80–5.20)
RDW: 17.1 % — AB (ref 11.5–14.5)
WBC: 6.4 10*3/uL (ref 3.6–11.0)

## 2017-12-02 LAB — BASIC METABOLIC PANEL
Anion gap: 8 (ref 5–15)
BUN: 25 mg/dL — ABNORMAL HIGH (ref 6–20)
CALCIUM: 8.4 mg/dL — AB (ref 8.9–10.3)
CO2: 21 mmol/L — AB (ref 22–32)
CREATININE: 1.41 mg/dL — AB (ref 0.44–1.00)
Chloride: 110 mmol/L (ref 101–111)
GFR calc non Af Amer: 40 mL/min — ABNORMAL LOW (ref >60)
GFR, EST AFRICAN AMERICAN: 47 mL/min — AB (ref >60)
Glucose, Bld: 224 mg/dL — ABNORMAL HIGH (ref 65–99)
Potassium: 4.3 mmol/L (ref 3.5–5.1)
SODIUM: 139 mmol/L (ref 135–145)

## 2017-12-02 LAB — TROPONIN I

## 2017-12-02 MED ORDER — MORPHINE SULFATE (PF) 4 MG/ML IV SOLN
4.0000 mg | Freq: Once | INTRAVENOUS | Status: AC
  Administered 2017-12-02: 4 mg via INTRAVENOUS
  Filled 2017-12-02: qty 1

## 2017-12-02 MED ORDER — SODIUM CHLORIDE 0.9 % IV BOLUS
500.0000 mL | Freq: Once | INTRAVENOUS | Status: AC
  Administered 2017-12-02: 500 mL via INTRAVENOUS

## 2017-12-02 NOTE — ED Notes (Signed)
Pt presents via EMS s/p syncopal episode resulting in a fall. C/o right arm pain and right knee pain. Pt reports weakness prior to event.

## 2017-12-02 NOTE — ED Notes (Signed)
Sling applied to right arm. vss.

## 2017-12-02 NOTE — ED Provider Notes (Signed)
[facility] Emergency Department Provider Note  ____________________________________________  Time seen: Approximately 5:58 PM  I have reviewed the triage vital signs and the nursing notes.   HISTORY  Chief Complaint Loss of Consciousness    HPI Renee Bush is a 56 y.o. female with a history of hypertension and GERD diabetes hypercholesterolemia and CK presents to the ED after syncope today. She reports that the last 2-3 days she has been having malaise and decreased appetite and decreased oral intake. She's been continuing to take her medications including Lasix over this time., And today while she was walking upstairs she got lightheaded and passed out causing her to fall on the steps.   She did not fall down the steps but did land on her right side and complains of pain in the right elbow and shoulder and right knee. She also complains of some intermittent fleeting chest pain since recovering. It is aching, central chest, nonradiating, without shortness of breath vomiting or diaphoresis. Not exertional, not pleuritic.   no extremity pains are worse with movement, no alleviating factors, moderate intensity, aching and constant.     Past Medical History:  Diagnosis Date  . Chronic kidney disease, stage III (moderate) (HCC) 10/06/2017  . Diabetes mellitus without complication (HCC)   . GERD (gastroesophageal reflux disease)   . Hypercholesteremia   . Hypertension 09/10/2015     Patient Active Problem List   Diagnosis Date Noted  . Abdominal pain 11/09/2017  . Nausea 11/09/2017  . Insomnia 10/06/2017  . GERD (gastroesophageal reflux disease) 10/06/2017  . Chronic kidney disease, stage III (moderate) (HCC) 10/06/2017  . B12 deficiency 06/09/2016  . Hypercholesteremia 05/05/2016  . Type 2 diabetes mellitus with diabetic neuropathy, with long-term current use of insulin (HCC) 09/10/2015  . Hypertension 09/10/2015  . ETOH abuse 04/18/2015  .  Fatigue 04/18/2015     Past Surgical History:  Procedure Laterality Date  . APPENDECTOMY    . CESAREAN SECTION  1991     Prior to Admission medications   Medication Sig Start Date End Date Taking? Authorizing Provider  aspirin EC 81 MG tablet Take 1 tablet (81 mg total) by mouth daily. 10/05/17   Kerman Passey, MD  furosemide (LASIX) 20 MG tablet Take 1 tablet (20 mg total) by mouth daily. 11/09/17   Doles-Johnson, Teah, NP  gabapentin (NEURONTIN) 400 MG capsule Take 1 capsule (400 mg total) by mouth 3 (three) times daily. 10/05/17   Lada, Janit Bern, MD  insulin aspart protamine- aspart (NOVOLOG MIX 70/30) (70-30) 100 UNIT/ML injection Forty-four units in the morning with breakfast, and forty in the evening with supper 10/05/17   Kerman Passey, MD  lisinopril (PRINIVIL,ZESTRIL) 10 MG tablet Take 1 tablet (10 mg total) by mouth daily. 11/09/17   Doles-Johnson, Teah, NP  metFORMIN (GLUCOPHAGE) 1000 MG tablet Take 1 tablet (1,000 mg total) by mouth 2 (two) times daily with a meal. 11/09/17   Doles-Johnson, Teah, NP  Metoprolol Tartrate 75 MG TABS Take 75 mg by mouth 2 (two) times daily. 10/05/17   Lada, Janit Bern, MD  nitroGLYCERIN (NITROSTAT) 0.4 MG SL tablet Place 1 tablet (0.4 mg total) under the tongue every 5 (five) minutes as needed for chest pain. 10/05/17   Kerman Passey, MD  nortriptyline (PAMELOR) 25 MG capsule 1 capsule (25 mg total) at bedtime. For two weeks, then stop 10/05/17   Lada, Janit Bern, MD  Omega-3 Fatty Acids (FISH OIL) 1000 MG CAPS Take 1,000 mg by  mouth 2 (two) times daily.    [provider]  omeprazole (PRILOSEC) 20 MG capsule Take 1 capsule (20 mg total) by mouth daily. 11/09/17   Doles-Johnson, Teah, NP  ondansetron (ZOFRAN) 4 MG tablet Take 1 tablet (4 mg total) by mouth every 8 (eight) hours as needed for nausea or vomiting. 11/09/17   Doles-Johnson, Teah, NP  pioglitazone (ACTOS) 30 MG tablet Take 1 tablet (30 mg total) by mouth daily. 11/09/17   Doles-Johnson,  Teah, NP  rosuvastatin (CRESTOR) 5 MG tablet Take 1 tablet (5 mg total) by mouth daily. 11/09/17   Doles-Johnson, Teah, NP     Allergies Etodolac; Penicillin g; and Penicillins   Family History  Problem Relation Age of Onset  . COPD Mother   . Alzheimer's disease Mother   . Hypertension Father   . Hyperlipidemia Father   . Heart attack Father   . Diabetes type II Father   . Diabetes type II Sister   . Gestational diabetes Sister     Social History Social History   Tobacco Use  . Smoking status: Former Smoker    Types: Cigarettes    Last attempt to quit: 04/17/2005    Years since quitting: 12.6  . Smokeless tobacco: Never Used  Substance Use Topics  . Alcohol use: No    Alcohol/week: 8.4 oz    Types: 14 Cans of beer per week    Comment: quit ~23mo ago  . Drug use: No    Review of Systems  Constitutional:   No fever or chills.   Cardiovascular:   positive as above for chest pain after syncope Respiratory:   No dyspnea or cough. Gastrointestinal:   Negative for abdominal pain, vomiting and diarrhea.  Musculoskeletal:   right shoulder pain, right elbow pain, right knee pain as above All other systems reviewed and are negative except as documented above in ROS and HPI.  ____________________________________________   PHYSICAL EXAM:  VITAL SIGNS: ED Triage Vitals  Enc Vitals Group     BP 12/02/17 1737 (!) 149/76     Pulse Rate 12/02/17 1737 94     Resp 12/02/17 1737 16     Temp 12/02/17 1737 98.4 F (36.9 C)     Temp Source 12/02/17 1737 Oral     SpO2 12/02/17 1737 98 %     Weight 12/02/17 1738 203 lb (92.1 kg)     Height 12/02/17 1738 5\' 2"  (1.575 m)     Head Circumference --      Peak Flow --      Pain Score 12/02/17 1737 8     Pain Loc --      Pain Edu? --      Excl. in GC? --     Vital signs reviewed, nursing assessments reviewed.   Constitutional:   Alert and oriented. Well appearing and in no distress. Eyes:   Conjunctivae are normal. EOMI.  PERRL. ENT      Head:   Normocephalic and atraumatic.      Nose:   No congestion/rhinnorhea. no epistaxis      Mouth/Throat:   dry mucous membranes, no pharyngeal erythema. No peritonsillar mass. no intraoral injuries      Neck:   No meningismus. Full ROM.no midline spinal tenderness Hematological/Lymphatic/Immunilogical:   No cervical lymphadenopathy. Cardiovascular:   RRR. Symmetric bilateral radial and DP pulses.  No murmurs.  Respiratory:   Normal respiratory effort without tachypnea/retractions. Breath sounds are clear and equal bilaterally. No wheezes/rales/rhonchi. Gastrointestinal:  Soft and nontender. Non distended. There is no CVA tenderness.  No rebound, rigidity, or guarding. Genitourinary:   deferred Musculoskeletal:   right shoulder pain diffusely around the joint and proximal humerus. Right elbow pain. Right knee pain at the joint line. Chest wall stable but tender in the area of pain and reproducing her symptoms over the sternum Neurologic:   Normal speech and language.  Motor grossly intact. No acute focal neurologic deficits are appreciated.  Skin:    Skin is warm, dry and intact. No rash noted.  No petechiae, purpura, or bullae.  ____________________________________________    LABS (pertinent positives/negatives) (all labs ordered are listed, but only abnormal results are displayed) Labs Reviewed  CBC - Abnormal; Notable for the following components:      Result Value   Hemoglobin 11.3 (*)    MCHC 31.8 (*)    RDW 17.1 (*)    All other components within normal limits  BASIC METABOLIC PANEL - Abnormal; Notable for the following components:   CO2 21 (*)    Glucose, Bld 224 (*)    BUN 25 (*)    Creatinine, Ser 1.41 (*)    Calcium 8.4 (*)    GFR calc non Af Amer 40 (*)    GFR calc Af Amer 47 (*)    All other components within normal limits  TROPONIN I  TROPONIN I   ____________________________________________   EKG  interpreted by me Sinus rhythm rate of  85, normal axis, normal intervals. Normal QRS ST segments and T waves.  ____________________________________________    RADIOLOGY  Dg Shoulder Right  Result Date: 12/02/2017 CLINICAL DATA:  Syncopal episode with fall.  Right shoulder pain. EXAM: RIGHT SHOULDER - 2+ VIEW COMPARISON:  None. FINDINGS: There is no evidence of fracture or dislocation. There is no evidence of arthropathy or other focal bone abnormality. Soft tissues are unremarkable. IMPRESSION: Negative. Electronically Signed   By: Kennith Center M.D.   On: 12/02/2017 18:28   Dg Elbow 2 Views Right  Result Date: 12/02/2017 CLINICAL DATA:  Syncopal episode with fall.  Pain. EXAM: RIGHT ELBOW - 2 VIEW COMPARISON:  None. FINDINGS: No fractures or joint effusions identified. Oval high density is seen in the soft tissues above the elbow along the ulnar aspect of the humerus. No other abnormalities. IMPRESSION: 1. No fracture or joint effusion. 2. An oval region of high density in the soft tissues along the ulnar aspect of the distal humerus could represent a soft tissue calcification versus an age-indeterminate foreign body. Recommend clinical correlation. Electronically Signed   By: Gerome Sam III M.D   On: 12/02/2017 18:29   Dg Knee Complete 4 Views Right  Result Date: 12/02/2017 CLINICAL DATA:  Pain after fall EXAM: RIGHT KNEE - COMPLETE 4+ VIEW COMPARISON:  None. FINDINGS: No evidence of fracture, dislocation, or joint effusion. No evidence of arthropathy or other focal bone abnormality. Soft tissues are unremarkable. IMPRESSION: Negative. Electronically Signed   By: Gerome Sam III M.D   On: 12/02/2017 18:31    ____________________________________________   PROCEDURES Procedures  ____________________________________________  DIFFERENTIAL DIAGNOSIS   dehydration, musculoskeletal injury of the right upper or right lower extremity. Chest wall contusion. Low suspicion of ACS PE dissection or AAA or sepsis. Low suspicion  stroke meningitis encephalitis or intracranial hemorrhage.  CLINICAL IMPRESSION / ASSESSMENT AND PLAN / ED COURSE  Pertinent labs & imaging results that were available during my care of the patient were reviewed by me and considered in my medical  decision making (see chart for details).    patient well-appearing no acute distress, unremarkable vital signs. We'll check EKG and labs, x-rays. Gentle hydration given Lasix use in the setting of poor oral intake.  Clinical Course as of Dec 02 2236  Sat Dec 02, 2017  1935 Xrays unremarkable. Labs unremarkable. Stable ckd. Will check delta trop. If repeat negative, pt suitable for DC home, f/u pcp/cards.    [PS]  2015 Had long discussion with pt and spouse regarding results.  Waiting for repeat trop for dispo. Sling for comfort. Counseld to hold lasix until she is eating and drinking more normally. F/u pcp 2 days.    [PS]  2109 Repeat trop negative. DC home.    [PS]    Clinical Course User Index [PS] Sharman CheekStafford, Effrey Davidow, MD     ____________________________________________   FINAL CLINICAL IMPRESSION(S) / ED DIAGNOSES    Final diagnoses:  Syncope, unspecified syncope type  Arm contusion, right, initial encounter  Contusion of right knee, initial encounter  Contusion of chest wall, unspecified laterality, initial encounter     ED Discharge Orders    None      Portions of this note were generated with dragon dictation software. Dictation errors may occur despite best attempts at proofreading.    Sharman CheekStafford, Selden Noteboom, MD 12/02/17 2312

## 2017-12-02 NOTE — ED Notes (Signed)
ED Provider at bedside. 

## 2017-12-02 NOTE — ED Triage Notes (Signed)
Pt arrives via ems from home, pt states that she was walking upstairs and passed out, pt is c/o pain in her rt arm and rt knee

## 2017-12-02 NOTE — ED Notes (Signed)
Patient transported to X-ray 

## 2017-12-07 ENCOUNTER — Ambulatory Visit: Payer: Self-pay | Admitting: Urology

## 2017-12-07 VITALS — BP 97/63 | HR 81 | Temp 97.7°F | Wt 211.1 lb

## 2017-12-07 DIAGNOSIS — R55 Syncope and collapse: Secondary | ICD-10-CM

## 2017-12-07 NOTE — Progress Notes (Signed)
Subjective:    Patient ID: Renee Bush, female    DOB: July 30, 1961, 57 y.o.   MRN: [id number]  HPI Did not get the abdominal ultrasound as she states that no one called her - she states that her stomach pain is better Her labs are stable She was seen in the ED for a syncopal episode - no cause was found  EKG, troponin's were normal She denies any heart palpitations, chest pain, SOB, dizziness or lightheadedness - she states she completely blacked out      Patient Active Problem List   Diagnosis Date Noted  . Abdominal pain 11/09/2017  . Nausea 11/09/2017  . Insomnia 10/06/2017  . GERD (gastroesophageal reflux disease) 10/06/2017  . Chronic kidney disease, stage III (moderate) (HCC) 10/06/2017  . B12 deficiency 06/09/2016  . Hypercholesteremia 05/05/2016  . Type 2 diabetes mellitus with diabetic neuropathy, with long-term current use of insulin (HCC) 09/10/2015  . Hypertension 09/10/2015  . ETOH abuse 04/18/2015  . Fatigue 04/18/2015   Allergies as of 12/07/2017      Reactions   Etodolac Rash   Penicillin G Rash   Penicillins Rash   Has patient had a PCN reaction causing immediate rash, facial/tongue/throat swelling, SOB or lightheadedness with hypotension: Yes Has patient had a PCN reaction causing severe rash involving mucus membranes or skin necrosis: No Has patient had a PCN reaction that required hospitalization: No Has patient had a PCN reaction occurring within the last 10 years: No If all of the above answers are "NO", then may proceed with Cephalosporin use.      Medication List        Accurate as of 12/07/17  6:33 PM. Always use your most recent med list.          aspirin EC 81 MG tablet Take 1 tablet (81 mg total) by mouth daily.   Fish Oil 1000 MG Caps Take 1,000 mg by mouth 2 (two) times daily.   furosemide 20 MG tablet Commonly known as:  LASIX Take 1 tablet (20 mg total) by mouth daily.   gabapentin 400 MG capsule Commonly known as:   NEURONTIN Take 1 capsule (400 mg total) by mouth 3 (three) times daily.   insulin aspart protamine- aspart (70-30) 100 UNIT/ML injection Commonly known as:  NOVOLOG MIX 70/30 Forty-four units in the morning with breakfast, and forty in the evening with supper   lisinopril 10 MG tablet Commonly known as:  PRINIVIL,ZESTRIL Take 1 tablet (10 mg total) by mouth daily.   metFORMIN 1000 MG tablet Commonly known as:  GLUCOPHAGE Take 1 tablet (1,000 mg total) by mouth 2 (two) times daily with a meal.   Metoprolol Tartrate 75 MG Tabs Take 75 mg by mouth 2 (two) times daily.   nitroGLYCERIN 0.4 MG SL tablet Commonly known as:  NITROSTAT Place 1 tablet (0.4 mg total) under the tongue every 5 (five) minutes as needed for chest pain.   nortriptyline 25 MG capsule Commonly known as:  PAMELOR 1 capsule (25 mg total) at bedtime. For two weeks, then stop   omeprazole 20 MG capsule Commonly known as:  PRILOSEC Take 1 capsule (20 mg total) by mouth daily.   ondansetron 4 MG tablet Commonly known as:  ZOFRAN Take 1 tablet (4 mg total) by mouth every 8 (eight) hours as needed for nausea or vomiting.   pioglitazone 30 MG tablet Commonly known as:  ACTOS Take 1 tablet (30 mg total) by mouth daily.   rosuvastatin 5 MG  tablet Commonly known as:  CRESTOR Take 1 tablet (5 mg total) by mouth daily.        Review of Systems  Constitutional: Negative.   HENT: Negative.   Eyes: Negative.   Respiratory: Negative.   Cardiovascular: Negative.   Gastrointestinal: Negative.   Endocrine: Negative.   Genitourinary: Negative.   Musculoskeletal: Negative.   Skin: Negative.   Allergic/Immunologic: Negative.   Neurological: Negative.   Hematological: Negative.   Psychiatric/Behavioral: Negative.        Objective:   Physical Exam  Constitutional: She is oriented to person, place, and time. She appears well-developed and well-nourished.  HENT:  Head: Normocephalic and atraumatic.  Right Ear:  External ear normal.  Left Ear: External ear normal.  Eyes: Pupils are equal, round, and reactive to light. Conjunctivae and EOM are normal.  Neck: Normal range of motion. Neck supple.  Cardiovascular: Normal rate, regular rhythm and normal heart sounds.  Pulmonary/Chest: Effort normal and breath sounds normal.  Abdominal: Soft. Bowel sounds are normal. She exhibits no distension. There is no tenderness.  Neurological: She is alert and oriented to person, place, and time.  Vitals reviewed.   BP 97/63   Pulse 81   Temp 97.7 F (36.5 C)   Wt 211 lb 1.9 oz (95.8 kg)   BMI 38.61 kg/m        Assessment & Plan:   Abdominal pain: Did not complete US.  Patient states it has resolved.  Continue the Prilosec  Syncopal episode - no immediate cause found in the ED Low BP - reduce the metoprolol to 50 mg bid and recheck BP in two weeks

## 2017-12-21 ENCOUNTER — Ambulatory Visit: Payer: Self-pay | Admitting: Adult Health Nurse Practitioner

## 2017-12-21 VITALS — BP 135/75 | HR 84 | Temp 98.2°F | Wt 206.1 lb

## 2017-12-21 DIAGNOSIS — E1140 Type 2 diabetes mellitus with diabetic neuropathy, unspecified: Secondary | ICD-10-CM

## 2017-12-21 DIAGNOSIS — I10 Essential (primary) hypertension: Secondary | ICD-10-CM

## 2017-12-21 DIAGNOSIS — Z794 Long term (current) use of insulin: Secondary | ICD-10-CM

## 2017-12-21 DIAGNOSIS — E7800 Pure hypercholesterolemia, unspecified: Secondary | ICD-10-CM

## 2017-12-21 MED ORDER — LISINOPRIL 10 MG PO TABS: 10.0000 mg | ORAL_TABLET | Freq: Every day | ORAL | 1 refills | Status: DC

## 2017-12-21 MED ORDER — OMEPRAZOLE 20 MG PO CPDR: 20.0000 mg | DELAYED_RELEASE_CAPSULE | Freq: Every day | ORAL | 1 refills | Status: DC

## 2017-12-21 MED ORDER — PIOGLITAZONE HCL 30 MG PO TABS: 30.0000 mg | ORAL_TABLET | Freq: Every day | ORAL | 1 refills | Status: DC

## 2017-12-21 MED ORDER — METFORMIN HCL 1000 MG PO TABS: 1000.0000 mg | ORAL_TABLET | Freq: Two times a day (BID) | ORAL | 1 refills | Status: DC

## 2017-12-21 MED ORDER — ROSUVASTATIN CALCIUM 5 MG PO TABS: 5.0000 mg | ORAL_TABLET | Freq: Every day | ORAL | 1 refills | Status: DC

## 2017-12-21 NOTE — Progress Notes (Signed)
Patient: Renee Bush Female    DOB: 1961-06-11   57 y.o.   MRN: [id number] Visit Date: 12/21/2017  Today's Provider: Jacelyn Pieah Doles-Johnson, NP   Chief Complaint  Patient presents with  . Follow-up    change in BP meds last apt   Subjective:    HPI    ... Allergies  Allergen Reactions  . Etodolac Rash  . Penicillin G Rash  . Penicillins Rash    Has patient had a PCN reaction causing immediate rash, facial/tongue/throat swelling, SOB or lightheadedness with hypotension: Yes Has patient had a PCN reaction causing severe rash involving mucus membranes or skin necrosis: No Has patient had a PCN reaction that required hospitalization: No Has patient had a PCN reaction occurring within the last 10 years: No If all of the above answers are "NO", then may proceed with Cephalosporin use.   Previous Medications   ASPIRIN EC 81 MG TABLET    Take 1 tablet (81 mg total) by mouth daily.   FUROSEMIDE (LASIX) 20 MG TABLET    Take 1 tablet (20 mg total) by mouth daily.   GABAPENTIN (NEURONTIN) 400 MG CAPSULE    Take 1 capsule (400 mg total) by mouth 3 (three) times daily.   INSULIN ASPART PROTAMINE- ASPART (NOVOLOG MIX 70/30) (70-30) 100 UNIT/ML INJECTION    Forty-four units in the morning with breakfast, and forty in the evening with supper   LISINOPRIL (PRINIVIL,ZESTRIL) 10 MG TABLET    Take 1 tablet (10 mg total) by mouth daily.   METFORMIN (GLUCOPHAGE) 1000 MG TABLET    Take 1 tablet (1,000 mg total) by mouth 2 (two) times daily with a meal.   METOPROLOL TARTRATE 75 MG TABS    Take 75 mg by mouth 2 (two) times daily.   NITROGLYCERIN (NITROSTAT) 0.4 MG SL TABLET    Place 1 tablet (0.4 mg total) under the tongue every 5 (five) minutes as needed for chest pain.   NORTRIPTYLINE (PAMELOR) 25 MG CAPSULE    1 capsule (25 mg total) at bedtime. For two weeks, then stop   OMEGA-3 FATTY ACIDS (FISH OIL) 1000 MG CAPS    Take 1,000 mg by mouth 2 (two) times daily.   OMEPRAZOLE (PRILOSEC) 20 MG  CAPSULE    Take 1 capsule (20 mg total) by mouth daily.   ONDANSETRON (ZOFRAN) 4 MG TABLET    Take 1 tablet (4 mg total) by mouth every 8 (eight) hours as needed for nausea or vomiting.   PIOGLITAZONE (ACTOS) 30 MG TABLET    Take 1 tablet (30 mg total) by mouth daily.   ROSUVASTATIN (CRESTOR) 5 MG TABLET    Take 1 tablet (5 mg total) by mouth daily.    Review of Systems  All other systems reviewed and are negative.   Social History   Tobacco Use  . Smoking status: Former Smoker    Types: Cigarettes    Last attempt to quit: 04/17/2005    Years since quitting: 12.6  . Smokeless tobacco: Never Used  Substance Use Topics  . Alcohol use: No    Alcohol/week: 8.4 oz    Types: 14 Cans of beer per week    Comment: quit ~3845mo ago   Objective:   BP 135/75   Pulse 84   Temp 98.2 F (36.8 C) (Oral)   Wt 206 lb 1.6 oz (93.5 kg)   BMI 37.70 kg/m   Physical Exam ...     Assessment & Plan:         .Marland Kitchen..Marland Kitchen  Staci Acosta, NP   [facility]

## 2017-12-21 NOTE — Progress Notes (Signed)
Subjective:    Patient ID: Renee Bush, female    DOB: 03/30/1961, 57 y.o.   MRN: [id number]  HPI  Renee Bush is a 58 yo female here for f/u of med assessment. On 4/11 Shannon decreased her Metoprolol to 50mg  BID. Pt's BP tonight is 135/75 and at target.  Pt reports her symptoms have been resolved.  Diabetes: pt reports she hasn't checked her BS in a few days but it has not been high recently.  Diet: pt reports cutting out bread and she eats small portions.   Patient Active Problem List   Diagnosis Date Noted  . Abdominal pain 11/09/2017  . Nausea 11/09/2017  . Insomnia 10/06/2017  . GERD (gastroesophageal reflux disease) 10/06/2017  . Chronic kidney disease, stage III (moderate) (HCC) 10/06/2017  . B12 deficiency 06/09/2016  . Hypercholesteremia 05/05/2016  . Type 2 diabetes mellitus with diabetic neuropathy, with long-term current use of insulin (HCC) 09/10/2015  . Hypertension 09/10/2015  . ETOH abuse 04/18/2015  . Fatigue 04/18/2015   Allergies as of 12/21/2017      Reactions   Etodolac Rash   Penicillin G Rash   Penicillins Rash   Has patient had a PCN reaction causing immediate rash, facial/tongue/throat swelling, SOB or lightheadedness with hypotension: Yes Has patient had a PCN reaction causing severe rash involving mucus membranes or skin necrosis: No Has patient had a PCN reaction that required hospitalization: No Has patient had a PCN reaction occurring within the last 10 years: No If all of the above answers are "NO", then may proceed with Cephalosporin use.      Medication List        Accurate as of 12/21/17  6:21 PM. Always use your most recent med list.          aspirin EC 81 MG tablet Take 1 tablet (81 mg total) by mouth daily.   Fish Oil 1000 MG Caps Take 1,000 mg by mouth 2 (two) times daily.   furosemide 20 MG tablet Commonly known as:  LASIX Take 1 tablet (20 mg total) by mouth daily.   gabapentin 400 MG capsule Commonly  known as:  NEURONTIN Take 1 capsule (400 mg total) by mouth 3 (three) times daily.   insulin aspart protamine- aspart (70-30) 100 UNIT/ML injection Commonly known as:  NOVOLOG MIX 70/30 Forty-four units in the morning with breakfast, and forty in the evening with supper   lisinopril 10 MG tablet Commonly known as:  PRINIVIL,ZESTRIL Take 1 tablet (10 mg total) by mouth daily.   metFORMIN 1000 MG tablet Commonly known as:  GLUCOPHAGE Take 1 tablet (1,000 mg total) by mouth 2 (two) times daily with a meal.   Metoprolol Tartrate 75 MG Tabs Take 75 mg by mouth 2 (two) times daily.   nitroGLYCERIN 0.4 MG SL tablet Commonly known as:  NITROSTAT Place 1 tablet (0.4 mg total) under the tongue every 5 (five) minutes as needed for chest pain.   nortriptyline 25 MG capsule Commonly known as:  PAMELOR 1 capsule (25 mg total) at bedtime. For two weeks, then stop   omeprazole 20 MG capsule Commonly known as:  PRILOSEC Take 1 capsule (20 mg total) by mouth daily.   ondansetron 4 MG tablet Commonly known as:  ZOFRAN Take 1 tablet (4 mg total) by mouth every 8 (eight) hours as needed for nausea or vomiting.   pioglitazone 30 MG tablet Commonly known as:  ACTOS Take 1 tablet (30 mg total) by mouth daily.  rosuvastatin 5 MG tablet Commonly known as:  CRESTOR Take 1 tablet (5 mg total) by mouth daily.          Review of Systems Last A1C was 9.2    Objective:   Physical Exam  Constitutional: She is oriented to person, place, and time. She appears well-developed and well-nourished.  Neck: Normal range of motion. Neck supple. No thyromegaly present.  Cardiovascular: Normal rate, regular rhythm, normal heart sounds and intact distal pulses.  Pulmonary/Chest: Effort normal and breath sounds normal.  Abdominal: Soft. Bowel sounds are normal.  Musculoskeletal:       Right ankle: She exhibits swelling.       Left ankle: She exhibits swelling.  Neurological: She is alert and oriented  to person, place, and time.  Skin: Skin is warm and dry.  Vitals reviewed.   BP 135/75   Pulse 84   Temp 98.2 F (36.8 C) (Oral)   Wt 206 lb 1.6 oz (93.5 kg)   BMI 37.70 kg/m        Assessment & Plan:   Routine labs tonight.  No changes to meds tonight.  F/u in 3 mo for routine care with labs a week prior.

## 2017-12-22 LAB — COMPREHENSIVE METABOLIC PANEL
ALBUMIN: 4 g/dL (ref 3.5–5.5)
ALK PHOS: 61 IU/L (ref 39–117)
ALT: 12 IU/L (ref 0–32)
AST: 9 IU/L (ref 0–40)
Albumin/Globulin Ratio: 1.7 (ref 1.2–2.2)
BUN / CREAT RATIO: 16 (ref 9–23)
BUN: 17 mg/dL (ref 6–24)
CHLORIDE: 104 mmol/L (ref 96–106)
CO2: 23 mmol/L (ref 20–29)
Calcium: 9.3 mg/dL (ref 8.7–10.2)
Creatinine, Ser: 1.09 mg/dL — ABNORMAL HIGH (ref 0.57–1.00)
GFR calc non Af Amer: 56 mL/min/{1.73_m2} — ABNORMAL LOW (ref >59)
GFR, EST AFRICAN AMERICAN: 65 mL/min/{1.73_m2} (ref >59)
GLOBULIN, TOTAL: 2.3 g/dL (ref 1.5–4.5)
Glucose: 259 mg/dL — ABNORMAL HIGH (ref 65–99)
Potassium: 6.1 mmol/L — ABNORMAL HIGH (ref 3.5–5.2)
SODIUM: 141 mmol/L (ref 134–144)
TOTAL PROTEIN: 6.3 g/dL (ref 6.0–8.5)

## 2017-12-22 LAB — LIPID PANEL
CHOL/HDL RATIO: 3.9 ratio (ref 0.0–4.4)
Cholesterol, Total: 153 mg/dL (ref 100–199)
HDL: 39 mg/dL — AB (ref >39)
LDL CALC: 79 mg/dL (ref 0–99)
TRIGLYCERIDES: 174 mg/dL — AB (ref 0–149)
VLDL Cholesterol Cal: 35 mg/dL (ref 5–40)

## 2017-12-22 LAB — HEMOGLOBIN A1C
Est. average glucose Bld gHb Est-mCnc: 214 mg/dL
HEMOGLOBIN A1C: 9.1 % — AB (ref 4.8–5.6)

## 2017-12-28 ENCOUNTER — Other Ambulatory Visit: Payer: Self-pay

## 2018-01-04 ENCOUNTER — Ambulatory Visit: Payer: Self-pay

## 2018-03-22 ENCOUNTER — Other Ambulatory Visit: Payer: Self-pay

## 2018-03-22 DIAGNOSIS — Z794 Long term (current) use of insulin: Principal | ICD-10-CM

## 2018-03-22 DIAGNOSIS — E1140 Type 2 diabetes mellitus with diabetic neuropathy, unspecified: Secondary | ICD-10-CM

## 2018-03-23 LAB — COMPREHENSIVE METABOLIC PANEL
A/G RATIO: 2 (ref 1.2–2.2)
ALBUMIN: 4.2 g/dL (ref 3.5–5.5)
ALT: 10 IU/L (ref 0–32)
AST: 11 IU/L (ref 0–40)
Alkaline Phosphatase: 57 IU/L (ref 39–117)
BUN/Creatinine Ratio: 17 (ref 9–23)
BUN: 19 mg/dL (ref 6–24)
CALCIUM: 8.9 mg/dL (ref 8.7–10.2)
CHLORIDE: 98 mmol/L (ref 96–106)
CO2: 24 mmol/L (ref 20–29)
Creatinine, Ser: 1.13 mg/dL — ABNORMAL HIGH (ref 0.57–1.00)
GFR, EST AFRICAN AMERICAN: 62 mL/min/{1.73_m2} (ref >59)
GFR, EST NON AFRICAN AMERICAN: 54 mL/min/{1.73_m2} — AB (ref >59)
Globulin, Total: 2.1 g/dL (ref 1.5–4.5)
Glucose: 178 mg/dL — ABNORMAL HIGH (ref 65–99)
POTASSIUM: 5 mmol/L (ref 3.5–5.2)
Sodium: 140 mmol/L (ref 134–144)
TOTAL PROTEIN: 6.3 g/dL (ref 6.0–8.5)

## 2018-03-23 LAB — HEMOGLOBIN A1C
ESTIMATED AVERAGE GLUCOSE: 212 mg/dL
Hgb A1c MFr Bld: 9 % — ABNORMAL HIGH (ref 4.8–5.6)

## 2018-03-29 ENCOUNTER — Ambulatory Visit: Payer: Self-pay | Admitting: Adult Health Nurse Practitioner

## 2018-03-29 VITALS — BP 139/81 | HR 96 | Temp 98.1°F | Ht 62.75 in | Wt 198.0 lb

## 2018-03-29 DIAGNOSIS — E1140 Type 2 diabetes mellitus with diabetic neuropathy, unspecified: Secondary | ICD-10-CM

## 2018-03-29 DIAGNOSIS — E7800 Pure hypercholesterolemia, unspecified: Secondary | ICD-10-CM

## 2018-03-29 DIAGNOSIS — E538 Deficiency of other specified B group vitamins: Secondary | ICD-10-CM

## 2018-03-29 DIAGNOSIS — I10 Essential (primary) hypertension: Secondary | ICD-10-CM

## 2018-03-29 DIAGNOSIS — Z794 Long term (current) use of insulin: Secondary | ICD-10-CM

## 2018-03-29 DIAGNOSIS — E1159 Type 2 diabetes mellitus with other circulatory complications: Secondary | ICD-10-CM

## 2018-03-29 MED ORDER — GABAPENTIN 400 MG PO CAPS: 400.0000 mg | ORAL_CAPSULE | Freq: Three times a day (TID) | ORAL | 3 refills | Status: DC

## 2018-03-29 MED ORDER — LISINOPRIL 10 MG PO TABS
10.0000 mg | ORAL_TABLET | Freq: Every day | ORAL | 1 refills | Status: DC
Start: 2018-03-29 — End: 2018-04-05

## 2018-03-29 MED ORDER — METFORMIN HCL 1000 MG PO TABS: 1000.0000 mg | ORAL_TABLET | Freq: Two times a day (BID) | ORAL | 1 refills | Status: DC

## 2018-03-29 MED ORDER — CYANOCOBALAMIN 1000 MCG/ML IJ SOLN
1000.0000 ug | Freq: Once | INTRAMUSCULAR | Status: AC
  Administered 2018-03-29: 1000 ug via INTRAMUSCULAR

## 2018-03-29 MED ORDER — FUROSEMIDE 20 MG PO TABS: 20.0000 mg | ORAL_TABLET | Freq: Every day | ORAL | 1 refills | Status: DC

## 2018-03-29 MED ORDER — ROSUVASTATIN CALCIUM 5 MG PO TABS: 5.0000 mg | ORAL_TABLET | Freq: Every day | ORAL | 1 refills | Status: DC

## 2018-03-29 MED ORDER — PIOGLITAZONE HCL 30 MG PO TABS
30.0000 mg | ORAL_TABLET | Freq: Every day | ORAL | 1 refills | Status: DC
Start: 2018-03-29 — End: 2018-04-05

## 2018-03-29 MED ORDER — INSULIN ASPART PROT & ASPART (70-30 MIX) 100 UNIT/ML ~~LOC~~ SUSP: SUBCUTANEOUS | 11 refills | Status: DC

## 2018-03-29 MED ORDER — OMEPRAZOLE 20 MG PO CPDR: 20.0000 mg | DELAYED_RELEASE_CAPSULE | Freq: Two times a day (BID) | ORAL | 2 refills | Status: DC

## 2018-03-29 NOTE — Addendum Note (Signed)
Addended by: Dustin FlockMAYNOR, Merek Niu D on: 03/29/2018 06:37 PM   Modules accepted: Orders

## 2018-03-29 NOTE — Progress Notes (Signed)
Patient: Renee Bush Female    DOB: [birth year]   57 y.o.   MRN: [id number] Visit Date: 03/29/2018  Today's Provider: Jacelyn Pi, NP   Chief Complaint  Patient presents with  . Follow-up  . Gastroesophageal Reflux  . Peripheral Neuropathy   Subjective:    HPI   DM/HTN/HLD:  Taking medications as directed.  On insulin thinks she is taking 45 units BID.   States that her neuropathy has gotten worse in her feet.  Sharp pains in her toes- sometimes difficulty with walking secondary to the pain.   States that the heartburn is so intense that she is waking up in the middle of the night.  Taking Zantac 150mg  BID in addition to omeprazole 20mg  daily.    Husband has oral cancer- currently on radiation. With a PEG tube.   Allergies  Allergen Reactions  . Etodolac Rash  . Penicillin G Rash  . Penicillins Rash    Has patient had a PCN reaction causing immediate rash, facial/tongue/throat swelling, SOB or lightheadedness with hypotension: Yes Has patient had a PCN reaction causing severe rash involving mucus membranes or skin necrosis: No Has patient had a PCN reaction that required hospitalization: No Has patient had a PCN reaction occurring within the last 10 years: No If all of the above answers are "NO", then may proceed with Cephalosporin use.   Previous Medications   ASPIRIN EC 81 MG TABLET    Take 1 tablet (81 mg total) by mouth daily.   FUROSEMIDE (LASIX) 20 MG TABLET    Take 1 tablet (20 mg total) by mouth daily.   GABAPENTIN (NEURONTIN) 400 MG CAPSULE    Take 1 capsule (400 mg total) by mouth 3 (three) times daily.   INSULIN ASPART PROTAMINE- ASPART (NOVOLOG MIX 70/30) (70-30) 100 UNIT/ML INJECTION    Forty-four units in the morning with breakfast, and forty in the evening with supper   LISINOPRIL (PRINIVIL,ZESTRIL) 10 MG TABLET    Take 1 tablet (10 mg total) by mouth daily.   METFORMIN (GLUCOPHAGE) 1000 MG TABLET    Take 1 tablet (1,000 mg total) by mouth 2  (two) times daily with a meal.   METOPROLOL TARTRATE 75 MG TABS    Take 75 mg by mouth 2 (two) times daily.   NITROGLYCERIN (NITROSTAT) 0.4 MG SL TABLET    Place 1 tablet (0.4 mg total) under the tongue every 5 (five) minutes as needed for chest pain.   NORTRIPTYLINE (PAMELOR) 25 MG CAPSULE    1 capsule (25 mg total) at bedtime. For two weeks, then stop   OMEGA-3 FATTY ACIDS (FISH OIL) 1000 MG CAPS    Take 1,000 mg by mouth 2 (two) times daily.   OMEPRAZOLE (PRILOSEC) 20 MG CAPSULE    Take 1 capsule (20 mg total) by mouth daily.   ONDANSETRON (ZOFRAN) 4 MG TABLET    Take 1 tablet (4 mg total) by mouth every 8 (eight) hours as needed for nausea or vomiting.   PIOGLITAZONE (ACTOS) 30 MG TABLET    Take 1 tablet (30 mg total) by mouth daily.   ROSUVASTATIN (CRESTOR) 5 MG TABLET    Take 1 tablet (5 mg total) by mouth daily.    Review of Systems  Social History   Tobacco Use  . Smoking status: Former Smoker    Types: Cigarettes    Last attempt to quit: 04/17/2005    Years since quitting: 12.9  . Smokeless tobacco: Never Used  Substance Use Topics  .  Alcohol use: No    Alcohol/week: 8.4 oz    Types: 14 Cans of beer per week    Comment: quit ~492mo ago   Objective:   BP 139/81 (BP Location: Left Arm, Patient Position: Sitting)   Pulse 96   Temp 98.1 F (36.7 C) (Oral)   Ht 5' 2.75" (1.594 m)   Wt 198 lb (89.8 kg)   BMI 35.35 kg/m   Physical Exam  Constitutional: She is oriented to person, place, and time. She appears well-developed and well-nourished.  Cardiovascular: Normal rate, regular rhythm, normal heart sounds and intact distal pulses.  2+ pitting edema bilaterally   Pulmonary/Chest: Effort normal and breath sounds normal.  Abdominal: Soft. Bowel sounds are normal.  Neurological: She is alert and oriented to person, place, and time.  Skin: Skin is warm and dry.  Psychiatric: She has a normal mood and affect. Her behavior is normal.        Assessment & Plan:         DM:   Uncontrolled.  Encourage diabetic diet and exercise.  Continue current medication regimen.  Increased gabapentin to 400mg  1 tab BID and 2 tabs QHS. Max renal dose.   Discussed CKD3.   HLD:  Continue current regimen.  Encourage low cholesterol, low fat diet and exercise.   HTN:  Controlled.   Goal BP <140/90.  Continue current medication regimen.  Encourage low salt diet and exercise.   GERD:  Increase Omeprazole to 20mg  BID.  Hold Zantac.  Avoid triggers.  Dont eat 2 hours before bed.   All medications refilled.   Discussed elevating BLE due to fluid retention and use or TED hose.   Discussed that therapy is available if needed to talk about caregiver strain.     Jacelyn Pieah Doles-Johnson, NP   [facility] [place]

## 2018-04-03 ENCOUNTER — Other Ambulatory Visit: Payer: Self-pay | Admitting: Adult Health Nurse Practitioner

## 2018-04-03 MED ORDER — INSULIN GLARGINE 100 UNIT/ML ~~LOC~~ SOLN: 30.0000 [IU] | Freq: Every day | SUBCUTANEOUS | 11 refills | Status: DC

## 2018-04-05 ENCOUNTER — Other Ambulatory Visit: Payer: Self-pay

## 2018-04-05 DIAGNOSIS — Z794 Long term (current) use of insulin: Secondary | ICD-10-CM

## 2018-04-05 DIAGNOSIS — I10 Essential (primary) hypertension: Secondary | ICD-10-CM

## 2018-04-05 DIAGNOSIS — E7800 Pure hypercholesterolemia, unspecified: Secondary | ICD-10-CM

## 2018-04-05 DIAGNOSIS — E1159 Type 2 diabetes mellitus with other circulatory complications: Secondary | ICD-10-CM

## 2018-04-05 DIAGNOSIS — E1140 Type 2 diabetes mellitus with diabetic neuropathy, unspecified: Secondary | ICD-10-CM

## 2018-04-05 MED ORDER — NITROGLYCERIN 0.4 MG SL SUBL: 0.4000 mg | SUBLINGUAL_TABLET | SUBLINGUAL | 3 refills | Status: DC | PRN

## 2018-04-05 MED ORDER — ROSUVASTATIN CALCIUM 5 MG PO TABS: 5.0000 mg | ORAL_TABLET | Freq: Every day | ORAL | 1 refills | Status: DC

## 2018-04-05 MED ORDER — FUROSEMIDE 20 MG PO TABS: 20.0000 mg | ORAL_TABLET | Freq: Every day | ORAL | 1 refills | Status: DC

## 2018-04-05 MED ORDER — METOPROLOL TARTRATE 75 MG PO TABS: 75.0000 mg | ORAL_TABLET | Freq: Two times a day (BID) | ORAL | 1 refills | Status: DC

## 2018-04-05 MED ORDER — OMEPRAZOLE 20 MG PO CPDR: 20.0000 mg | DELAYED_RELEASE_CAPSULE | Freq: Two times a day (BID) | ORAL | 2 refills | Status: DC

## 2018-04-05 MED ORDER — GABAPENTIN 400 MG PO CAPS: 400.0000 mg | ORAL_CAPSULE | Freq: Three times a day (TID) | ORAL | 3 refills | Status: DC

## 2018-04-05 MED ORDER — PIOGLITAZONE HCL 30 MG PO TABS: 30.0000 mg | ORAL_TABLET | Freq: Every day | ORAL | 1 refills | Status: DC

## 2018-04-05 MED ORDER — METFORMIN HCL 1000 MG PO TABS: 1000.0000 mg | ORAL_TABLET | Freq: Two times a day (BID) | ORAL | 1 refills | Status: DC

## 2018-04-05 MED ORDER — LISINOPRIL 10 MG PO TABS: 10.0000 mg | ORAL_TABLET | Freq: Every day | ORAL | 1 refills | Status: DC

## 2018-04-20 ENCOUNTER — Ambulatory Visit: Payer: Self-pay | Admitting: Pharmacy Technician

## 2018-04-20 DIAGNOSIS — Z79899 Other long term (current) drug therapy: Secondary | ICD-10-CM

## 2018-04-25 ENCOUNTER — Other Ambulatory Visit: Payer: Self-pay

## 2018-04-25 ENCOUNTER — Encounter: Payer: Self-pay | Admitting: Pharmacist

## 2018-04-25 ENCOUNTER — Ambulatory Visit: Payer: Self-pay | Admitting: Pharmacist

## 2018-04-25 VITALS — Ht 62.0 in | Wt 201.0 lb

## 2018-04-25 DIAGNOSIS — Z79899 Other long term (current) drug therapy: Secondary | ICD-10-CM

## 2018-04-25 NOTE — Progress Notes (Signed)
  Completed [facility] application and contract.  Patient agreed to all terms of the [facility] contract.    Patient approved to receive medication assistance at [facility] as long as eligibility criteria continues to be met.    Provided patient with community resource material based on her particular needs.    Lantus & Trulicity Prescription Applications completed with patient.  Forwarded to [facility] for signature.  Upon receipt of signed applications from provider, Lantus Prescription Application will be submitted to [place] Prescription Application will be submitted to [place].  Patient indicated that she needs Ortho Feet Supportive shoes but is unable to obtain them due to financial reasons.  Have reached out [facility] to inquire about assisting patient with obtaining shoes.  [facility]

## 2018-04-25 NOTE — Progress Notes (Addendum)
[facility] Visit Note  Patient: Renee Bush MRN: [id number] Date of Birth: 05-Sep-1960 PCP: Virl Axe, MD   [name] 57 y.o. female presents for a medication therapy management visit today.  Ht 5\' 2"  (1.575 m)   Wt 201 lb (91.2 kg)   BMI 36.76 kg/m   Patient Information   Past Medical History:  Diagnosis Date  . Chronic kidney disease, stage III (moderate) (HCC) 10/06/2017  . Diabetes mellitus without complication (HCC)   . GERD (gastroesophageal reflux disease)   . Hypercholesteremia   . Hypertension 09/10/2015      Past Surgical History:  Procedure Laterality Date  . APPENDECTOMY    . CESAREAN SECTION  1991     Family History  Problem Relation Age of Onset  . COPD Mother   . Alzheimer's disease Mother   . Hypertension Father   . Hyperlipidemia Father   . Heart attack Father   . Diabetes type II Father   . Diabetes type II Sister   . Diabetes type II Sister   . Gestational diabetes Daughter   . Diabetes type II Sister     New Diagnoses (since last visit):   Family Support: Good  Lifestyle Diet: Breakfast: Eggs, Lunch: sandwich Dinner: Drinks: Milk (Sometimes she doesn't have appetite)    Current Exercise Habits: Home exercise routine, Type of exercise: walking(on her feet daily taking care of husband), Time (Minutes): 60, Frequency (Times/Week): 5, Weekly Exercise (Minutes/Week): 300  Exercise limited by: Other - see comments(Neuropathy in feet)    Social History   Substance and Sexual Activity  Alcohol Use No  . Alcohol/week: 14.0 standard drinks  . Types: 14 Cans of beer per week   Comment: quit ~70mo ago      Social History   Tobacco Use  Smoking Status Former Smoker  . Types: Cigarettes  . Last attempt to quit: 04/17/2005  . Years since quitting: 13.0  Smokeless Tobacco Never Used      Health Maintenance  Topic Date Due  . Hepatitis C Screening  08/17/1960  . PNEUMOCOCCAL POLYSACCHARIDE  VACCINE AGE 26-64 HIGH RISK  07/22/1962  . FOOT EXAM  07/22/1970  . HIV Screening  07/23/1975  . PAP SMEAR  07/22/1981  . MAMMOGRAM  07/22/2010  . COLONOSCOPY  07/22/2010  . INFLUENZA VACCINE  03/29/2018  . OPHTHALMOLOGY EXAM  09/07/2018  . HEMOGLOBIN A1C  09/22/2018  . TETANUS/TDAP  06/03/2025   Outpatient Encounter Medications as of 04/25/2018  Medication Sig  . aspirin EC 81 MG tablet Take 1 tablet (81 mg total) by mouth daily.  . furosemide (LASIX) 20 MG tablet Take 1 tablet (20 mg total) by mouth daily.  Marland Kitchen gabapentin (NEURONTIN) 400 MG capsule Take 1 capsule (400 mg total) by mouth 3 (three) times daily. Take 2 caps at bedtime. (Patient taking differently: Take 400 mg by mouth 3 (three) times daily. Take 1 cap in the morning, Take 1 cap at noon, Take 2 caps at bedtime.)  . insulin glargine (LANTUS) 100 UNIT/ML injection Inject 0.3 mLs (30 Units total) into the skin daily.  Marland Kitchen lisinopril (PRINIVIL,ZESTRIL) 10 MG tablet Take 1 tablet (10 mg total) by mouth daily.  . metFORMIN (GLUCOPHAGE) 1000 MG tablet Take 1 tablet (1,000 mg total) by mouth 2 (two) times daily with a meal.  . Metoprolol Tartrate 75 MG TABS Take 75 mg by mouth 2 (two) times daily.  . nitroGLYCERIN (NITROSTAT) 0.4 MG SL tablet Place 1 tablet (0.4 mg total)  under the tongue every 5 (five) minutes as needed for chest pain.  . Omega-3 Fatty Acids (FISH OIL) 1000 MG CAPS Take 1,000 mg by mouth 2 (two) times daily.  Marland Kitchen. omeprazole (PRILOSEC) 20 MG capsule Take 1 capsule (20 mg total) by mouth 2 (two) times daily before a meal.  . pioglitazone (ACTOS) 30 MG tablet Take 1 tablet (30 mg total) by mouth daily.  . rosuvastatin (CRESTOR) 5 MG tablet Take 1 tablet (5 mg total) by mouth daily.   Facility-Administered Encounter Medications as of 04/25/2018  Medication  . cyanocobalamin ((VITAMIN B-12)) injection 1,000 mcg   Health Maintenance/Date Completed  Last ED visit:  Last Visit to PCP:  03/29/2018 Next Visit to PCP:  07/05/2018 Specialist Visit:  Dental Exam:  Eye Exam: [facility] Prostate Exam:  Pelvic/PAP Exam:  Mammogram:  DEXA:  Colonoscopy:  Flu Vaccine: Annually  Pneumonia Vaccine:    Assessment and Plan:  Compliance: Patient is adherent to all medications. She was given a pill box today.  Diabetes/Neuropathy: Patient is currently taking Lantus and Metformin. She previously used Novolin 70/30. Patient states the medication works well for her and the solostar device is patient friendly. She has not experienced any side effects associated with hypoglycemia with the initiation of Lantus. Patient's A1c reading as of 03/22/18 is 9%. She states the glucose meter continually gives error messages and may not be providing accurate readings of her blood glucose. Patient is currently taking gabapentin for management of neuropathic pain. Dose has been increased to 1600mg  daily but still complains of intense pain and difficulty walking. She describes her pain as a 7-10 daily. She stated the gabapentin could not be increased further due to renal issues. As 03/22/2018, Scr 1.13 and GFR 54. [facility] will be consulted about the consideration of Cymbalta as an add-on medication for neuropathic pain.  GERD: Currently managed by omeprazole.  Blood Pressure: Patient takes lisinopril, metoprolol, and furosemide.  Currently managed by all three medications.   Hyperlipidemia: Currently taking rosuvastatin and omega-3-fatty acids. Cholesterol readings as of 12/21/2017 include; TC 153, TG 174, HDL 39, LDL 79.  Cosigned: Keri K. Joelene MillinHarrison, BS, PharmD [facility] Clinic-Pharmacy Operations Coordinator [phone]

## 2018-04-25 NOTE — Patient Instructions (Signed)
[facility] will reach out to [facility] to inquire about Cymbalta for neuropathy in feet.   [facility] May need a new battery or a new meter.

## 2018-05-03 ENCOUNTER — Other Ambulatory Visit: Payer: Self-pay

## 2018-05-03 DIAGNOSIS — E538 Deficiency of other specified B group vitamins: Secondary | ICD-10-CM

## 2018-05-03 MED ORDER — CYANOCOBALAMIN 1000 MCG/ML IJ SOLN
1000.0000 ug | Freq: Once | INTRAMUSCULAR | Status: AC
  Administered 2018-05-03: 1000 ug via INTRAMUSCULAR

## 2018-05-04 ENCOUNTER — Telehealth: Payer: Self-pay | Admitting: Pharmacist

## 2018-05-04 NOTE — Telephone Encounter (Signed)
05/04/2018 10:21:44 AM - Trulicity  05/04/18 Faxed [employer] application for Trulicity Inject 0.75mg  once a week. Forde Radon

## 2018-05-25 ENCOUNTER — Telehealth: Payer: Self-pay | Admitting: Pharmacist

## 2018-05-25 NOTE — Telephone Encounter (Signed)
05/25/2018 10:50:55 AM - Lantus Solostar to [employer]  05/25/18 Faxed [employer] application for enrollment on Lantus Solostar pen Inject 30 units under the skin daily at bedtime # 2.Forde Radon

## 2018-05-31 ENCOUNTER — Other Ambulatory Visit: Payer: Self-pay

## 2018-05-31 DIAGNOSIS — E538 Deficiency of other specified B group vitamins: Secondary | ICD-10-CM

## 2018-05-31 MED ORDER — CYANOCOBALAMIN 1000 MCG/ML IJ SOLN
1000.0000 ug | Freq: Once | INTRAMUSCULAR | Status: AC
  Administered 2018-05-31: 1000 ug via INTRAMUSCULAR

## 2018-06-01 ENCOUNTER — Telehealth: Payer: Self-pay

## 2018-06-01 NOTE — Telephone Encounter (Signed)
SW Samantha at [facility]ication [facility] about Renee Bush's Lantus and Trulicity.  Renee Bush came into the clinic on 05/31/18 and was concerned about taking the 2 medications together.  I assured her I would call [facility] and verify the dosing.  I SW [place] and she clarified the patient was to inject the Trulicity 1 x a week and the Lantus daily.  Smanatha stated she would get a pharmacist to call the patient and go over the medications and instructions for taking them.

## 2018-06-28 ENCOUNTER — Other Ambulatory Visit: Payer: Self-pay

## 2018-07-03 ENCOUNTER — Other Ambulatory Visit: Payer: Self-pay

## 2018-07-03 NOTE — Addendum Note (Signed)
Addended by: Dustin Flock D on: 07/03/2018 06:34 PM   Modules accepted: Orders

## 2018-07-04 LAB — COMPREHENSIVE METABOLIC PANEL
ALBUMIN: 3.8 g/dL (ref 3.5–5.5)
ALT: 15 IU/L (ref 0–32)
AST: 13 IU/L (ref 0–40)
Albumin/Globulin Ratio: 1.7 (ref 1.2–2.2)
Alkaline Phosphatase: 47 IU/L (ref 39–117)
BUN/Creatinine Ratio: 10 (ref 9–23)
BUN: 12 mg/dL (ref 6–24)
Bilirubin Total: <0.2 mg/dL (ref 0.0–1.2)
CO2: 23 mmol/L (ref 20–29)
CREATININE: 1.22 mg/dL — AB (ref 0.57–1.00)
Calcium: 8.9 mg/dL (ref 8.7–10.2)
Chloride: 105 mmol/L (ref 96–106)
GFR calc non Af Amer: 49 mL/min/{1.73_m2} — ABNORMAL LOW (ref >59)
GFR, EST AFRICAN AMERICAN: 57 mL/min/{1.73_m2} — AB (ref >59)
GLUCOSE: 89 mg/dL (ref 65–99)
Globulin, Total: 2.2 g/dL (ref 1.5–4.5)
Potassium: 5.2 mmol/L (ref 3.5–5.2)
Sodium: 142 mmol/L (ref 134–144)
TOTAL PROTEIN: 6 g/dL (ref 6.0–8.5)

## 2018-07-04 LAB — B12 AND FOLATE PANEL
FOLATE: 14 ng/mL (ref >3.0)
VITAMIN B 12: 392 pg/mL (ref 232–1245)

## 2018-07-04 LAB — LIPID PANEL
CHOL/HDL RATIO: 3.2 ratio (ref 0.0–4.4)
Cholesterol, Total: 131 mg/dL (ref 100–199)
HDL: 41 mg/dL (ref >39)
LDL CALC: 69 mg/dL (ref 0–99)
Triglycerides: 105 mg/dL (ref 0–149)
VLDL CHOLESTEROL CAL: 21 mg/dL (ref 5–40)

## 2018-07-04 LAB — HEMOGLOBIN A1C
ESTIMATED AVERAGE GLUCOSE: 163 mg/dL
HEMOGLOBIN A1C: 7.3 % — AB (ref 4.8–5.6)

## 2018-07-04 LAB — CBC
Hematocrit: 29.6 % — ABNORMAL LOW (ref 34.0–46.6)
Hemoglobin: 9.2 g/dL — ABNORMAL LOW (ref 11.1–15.9)
MCH: 25.8 pg — ABNORMAL LOW (ref 26.6–33.0)
MCHC: 31.1 g/dL — AB (ref 31.5–35.7)
MCV: 83 fL (ref 79–97)
Platelets: 231 10*3/uL (ref 150–450)
RBC: 3.57 x10E6/uL — ABNORMAL LOW (ref 3.77–5.28)
RDW: 15.8 % — AB (ref 12.3–15.4)
WBC: 5.5 10*3/uL (ref 3.4–10.8)

## 2018-07-05 ENCOUNTER — Other Ambulatory Visit: Payer: Self-pay | Admitting: Adult Health

## 2018-07-05 ENCOUNTER — Ambulatory Visit: Payer: Self-pay | Admitting: Adult Health Nurse Practitioner

## 2018-07-05 ENCOUNTER — Other Ambulatory Visit: Payer: Self-pay

## 2018-07-05 VITALS — BP 112/84 | Temp 98.1°F | Ht 63.0 in | Wt 207.5 lb

## 2018-07-05 DIAGNOSIS — E1140 Type 2 diabetes mellitus with diabetic neuropathy, unspecified: Secondary | ICD-10-CM

## 2018-07-05 DIAGNOSIS — E1159 Type 2 diabetes mellitus with other circulatory complications: Secondary | ICD-10-CM

## 2018-07-05 DIAGNOSIS — E7800 Pure hypercholesterolemia, unspecified: Secondary | ICD-10-CM

## 2018-07-05 DIAGNOSIS — Z794 Long term (current) use of insulin: Secondary | ICD-10-CM

## 2018-07-05 DIAGNOSIS — N183 Chronic kidney disease, stage 3 unspecified: Secondary | ICD-10-CM

## 2018-07-05 DIAGNOSIS — D649 Anemia, unspecified: Secondary | ICD-10-CM | POA: Insufficient documentation

## 2018-07-05 DIAGNOSIS — I10 Essential (primary) hypertension: Secondary | ICD-10-CM

## 2018-07-05 DIAGNOSIS — E538 Deficiency of other specified B group vitamins: Secondary | ICD-10-CM

## 2018-07-05 MED ORDER — FERROUS SULFATE 325 (65 FE) MG PO TABS: 325.0000 mg | ORAL_TABLET | Freq: Every day | ORAL | 3 refills | Status: DC

## 2018-07-05 MED ORDER — METFORMIN HCL 1000 MG PO TABS: 1000.0000 mg | ORAL_TABLET | Freq: Two times a day (BID) | ORAL | 1 refills | Status: DC

## 2018-07-05 MED ORDER — OMEPRAZOLE 20 MG PO CPDR: 20.0000 mg | DELAYED_RELEASE_CAPSULE | Freq: Two times a day (BID) | ORAL | 2 refills | Status: DC

## 2018-07-05 MED ORDER — METOPROLOL TARTRATE 75 MG PO TABS: 75.0000 mg | ORAL_TABLET | Freq: Two times a day (BID) | ORAL | 1 refills | Status: DC

## 2018-07-05 MED ORDER — ROSUVASTATIN CALCIUM 5 MG PO TABS: 5.0000 mg | ORAL_TABLET | Freq: Every day | ORAL | 1 refills | Status: DC

## 2018-07-05 MED ORDER — PIOGLITAZONE HCL 30 MG PO TABS: 30.0000 mg | ORAL_TABLET | Freq: Every day | ORAL | 1 refills | Status: DC

## 2018-07-05 MED ORDER — MELOXICAM 7.5 MG PO TABS: 7.5000 mg | ORAL_TABLET | Freq: Every day | ORAL | 1 refills | Status: DC

## 2018-07-05 MED ORDER — FUROSEMIDE 20 MG PO TABS: 20.0000 mg | ORAL_TABLET | Freq: Every day | ORAL | 1 refills | Status: DC

## 2018-07-05 MED ORDER — LISINOPRIL 10 MG PO TABS: 10.0000 mg | ORAL_TABLET | Freq: Every day | ORAL | 1 refills | Status: DC

## 2018-07-05 MED ORDER — GABAPENTIN 400 MG PO CAPS: 400.0000 mg | ORAL_CAPSULE | Freq: Three times a day (TID) | ORAL | 1 refills | Status: DC

## 2018-07-05 NOTE — Progress Notes (Signed)
Labs reviewed. Feosol ordered for anemia

## 2018-07-05 NOTE — Progress Notes (Signed)
Subjective:    Patient ID: Renee Bush, female    DOB: [birth year], 57 y.o.   MRN: [id number]  HPI  Renee Bush is a 57 yo F here for f/u of DM and lab results. DM: well controlled. She is checking her glucose, in am 100-125. HTN: BP controlled and at target at 112/84. Neuropathy: Pt endorses taking Gabapentin 400mg  1 in am, 1 at lunch, and 2 in pm but reports throbbing pain in bilateral legs. She reports Tylenol or Ibuprofen don't provide relief.  Pt denies blood in urine or stools.   Patient Active Problem List   Diagnosis Date Noted  . Anemia 07/05/2018  . Abdominal pain 11/09/2017  . Nausea 11/09/2017  . Insomnia 10/06/2017  . GERD (gastroesophageal reflux disease) 10/06/2017  . Chronic kidney disease, stage III (moderate) (HCC) 10/06/2017  . B12 deficiency 06/09/2016  . Hypercholesteremia 05/05/2016  . Type 2 diabetes mellitus with diabetic neuropathy, with long-term current use of insulin (HCC) 09/10/2015  . Hypertension 09/10/2015  . ETOH abuse 04/18/2015  . Fatigue 04/18/2015   Allergies as of 07/05/2018      Reactions   Etodolac Rash   Penicillin G Rash   Penicillins Rash   Has patient had a PCN reaction causing immediate rash, facial/tongue/throat swelling, SOB or lightheadedness with hypotension: Yes Has patient had a PCN reaction causing severe rash involving mucus membranes or skin necrosis: No Has patient had a PCN reaction that required hospitalization: No Has patient had a PCN reaction occurring within the last 10 years: No If all of the above answers are "NO", then may proceed with Cephalosporin use.      Medication List        Accurate as of 07/05/18  6:04 PM. Always use your most recent med list.          aspirin EC 81 MG tablet Take 1 tablet (81 mg total) by mouth daily.   ferrous sulfate 325 (65 FE) MG tablet Take 1 tablet (325 mg total) by mouth daily with breakfast.   Fish Oil 1000 MG Caps Take 1,000 mg by mouth 2 (two) times  daily.   furosemide 20 MG tablet Commonly known as:  LASIX Take 1 tablet (20 mg total) by mouth daily.   gabapentin 400 MG capsule Commonly known as:  NEURONTIN Take 1 capsule (400 mg total) by mouth 3 (three) times daily. Take 2 caps at bedtime.   insulin glargine 100 UNIT/ML injection Commonly known as:  LANTUS Inject 0.3 mLs (30 Units total) into the skin daily.   lisinopril 10 MG tablet Commonly known as:  PRINIVIL,ZESTRIL Take 1 tablet (10 mg total) by mouth daily.   metFORMIN 1000 MG tablet Commonly known as:  GLUCOPHAGE Take 1 tablet (1,000 mg total) by mouth 2 (two) times daily with a meal.   Metoprolol Tartrate 75 MG Tabs Take 75 mg by mouth 2 (two) times daily.   nitroGLYCERIN 0.4 MG SL tablet Commonly known as:  NITROSTAT Place 1 tablet (0.4 mg total) under the tongue every 5 (five) minutes as needed for chest pain.   omeprazole 20 MG capsule Commonly known as:  PRILOSEC Take 1 capsule (20 mg total) by mouth 2 (two) times daily before a meal.   pioglitazone 30 MG tablet Commonly known as:  ACTOS Take 1 tablet (30 mg total) by mouth daily.   rosuvastatin 5 MG tablet Commonly known as:  CRESTOR Take 1 tablet (5 mg total) by mouth daily.  Review of Systems  All other systems reviewed and are negative.  A1C elevated but controlled at 7.3 and down from 9.0. Creatinine elevated at 1.22 but stable. Hemoglobin down at 9.2. All other results negative.     Objective:   Physical Exam  Constitutional: She is oriented to person, place, and time. She appears well-developed and well-nourished.  Cardiovascular: Normal rate, regular rhythm and normal heart sounds.  Pulmonary/Chest: Effort normal and breath sounds normal.  Abdominal: Soft. Bowel sounds are normal.  Neurological: She is alert and oriented to person, place, and time.  Skin: Skin is warm and dry.  Psychiatric: She has a normal mood and affect. Her behavior is normal. Judgment and thought content  normal.  Vitals reviewed.   BP 112/84 (BP Location: Left Arm, Patient Position: Sitting)   Temp 98.1 F (36.7 C)   Ht 5\' 3"  (1.6 m)   Wt 207 lb 8 oz (94.1 kg)   BMI 36.76 kg/m        Assessment & Plan:   B12 shot tonight   HTN: Controlled. Continue current medication regime.   DM: Controlled. Pt is doing well with current medication regimen.    Neuropathy: Pt is at renal max for Gabapentin.  Rx Meloxicam 7.5mg . Discussed with pt if experience rash or allergic reaction to stop treatment.   Low hemoglobin: Recheck in 6 mo Continue B12 injections.  Start Iron supplement daily.   F/u in 1 mo to reevaluate Meloxicam use and to recheck BMET, kidney function.

## 2018-08-01 ENCOUNTER — Telehealth: Payer: Self-pay | Admitting: Pharmacist

## 2018-08-01 NOTE — Telephone Encounter (Signed)
08/01/2018 4:08:20 PM - Lantus Solostar refill to dr  08/01/18 Taking [employer] [facility] Inject 30 units daily at bedtime #2.Forde RadonAJ

## 2018-08-02 ENCOUNTER — Encounter: Payer: Self-pay | Admitting: Pharmacist

## 2018-08-02 ENCOUNTER — Ambulatory Visit: Payer: Self-pay | Admitting: Pharmacist

## 2018-08-02 ENCOUNTER — Ambulatory Visit: Payer: Self-pay | Admitting: Adult Health Nurse Practitioner

## 2018-08-02 VITALS — BP 153/86 | HR 98 | Temp 98.1°F | Ht 62.0 in | Wt 202.9 lb

## 2018-08-02 VITALS — BP 132/78 | Ht 62.0 in | Wt 203.0 lb

## 2018-08-02 DIAGNOSIS — Z794 Long term (current) use of insulin: Secondary | ICD-10-CM

## 2018-08-02 DIAGNOSIS — I10 Essential (primary) hypertension: Secondary | ICD-10-CM

## 2018-08-02 DIAGNOSIS — Z79899 Other long term (current) drug therapy: Secondary | ICD-10-CM

## 2018-08-02 DIAGNOSIS — E1140 Type 2 diabetes mellitus with diabetic neuropathy, unspecified: Secondary | ICD-10-CM

## 2018-08-02 DIAGNOSIS — E538 Deficiency of other specified B group vitamins: Secondary | ICD-10-CM

## 2018-08-02 MED ORDER — MELOXICAM 7.5 MG PO TABS: 7.5000 mg | ORAL_TABLET | Freq: Every day | ORAL | 1 refills | Status: DC

## 2018-08-02 MED ORDER — GABAPENTIN 400 MG PO CAPS: 400.0000 mg | ORAL_CAPSULE | Freq: Three times a day (TID) | ORAL | 1 refills | Status: DC

## 2018-08-02 MED ORDER — CYANOCOBALAMIN 1000 MCG/ML IJ SOLN
1000.0000 ug | Freq: Once | INTRAMUSCULAR | Status: AC
  Administered 2018-08-02: 1000 ug via INTRAMUSCULAR

## 2018-08-02 NOTE — Progress Notes (Signed)
Patient: Renee Bush Female    DOB: 08-Mar-1961   57 y.o.   MRN: [id number] Visit Date: 08/02/2018  Today's Provider: Jacelyn Pi, NP   Chief Complaint  Patient presents with  . Follow-up    F/u for new medication - meloxicam   Subjective:    HPI  Here for FU from addition of meloxicam at last visit.  Pt states that meloxicam is working well.      Allergies  Allergen Reactions  . Etodolac Rash  . Penicillin G Rash  . Penicillins Rash    Has patient had a PCN reaction causing immediate rash, facial/tongue/throat swelling, SOB or lightheadedness with hypotension: Yes Has patient had a PCN reaction causing severe rash involving mucus membranes or skin necrosis: No Has patient had a PCN reaction that required hospitalization: No Has patient had a PCN reaction occurring within the last 10 years: No If all of the above answers are "NO", then may proceed with Cephalosporin use.   Previous Medications   ASPIRIN EC 81 MG TABLET    Take 1 tablet (81 mg total) by mouth daily.   DULAGLUTIDE (TRULICITY) 0.75 MG/0.5ML SOPN    Inject 0.75 mg into the skin once a week.   FERROUS SULFATE (FEOSOL) 325 (65 FE) MG TABLET    Take 1 tablet (325 mg total) by mouth daily with breakfast.   FUROSEMIDE (LASIX) 20 MG TABLET    Take 1 tablet (20 mg total) by mouth daily.   GABAPENTIN (NEURONTIN) 400 MG CAPSULE    Take 1 capsule (400 mg total) by mouth 3 (three) times daily. Take 2 caps at bedtime.   INSULIN GLARGINE (LANTUS) 100 UNIT/ML INJECTION    Inject 0.3 mLs (30 Units total) into the skin daily.   LISINOPRIL (PRINIVIL,ZESTRIL) 10 MG TABLET    Take 1 tablet (10 mg total) by mouth daily.   MELOXICAM (MOBIC) 7.5 MG TABLET    Take 1 tablet (7.5 mg total) by mouth daily.   METFORMIN (GLUCOPHAGE) 1000 MG TABLET    Take 1 tablet (1,000 mg total) by mouth 2 (two) times daily with a meal.   METOPROLOL TARTRATE 75 MG TABS    Take 75 mg by mouth 2 (two) times daily.   NITROGLYCERIN (NITROSTAT)  0.4 MG SL TABLET    Place 1 tablet (0.4 mg total) under the tongue every 5 (five) minutes as needed for chest pain.   OMEGA-3 FATTY ACIDS (FISH OIL) 1000 MG CAPS    Take 1,000 mg by mouth 2 (two) times daily.   OMEPRAZOLE (PRILOSEC) 20 MG CAPSULE    Take 1 capsule (20 mg total) by mouth 2 (two) times daily before a meal.   PIOGLITAZONE (ACTOS) 30 MG TABLET    Take 1 tablet (30 mg total) by mouth daily.   ROSUVASTATIN (CRESTOR) 5 MG TABLET    Take 1 tablet (5 mg total) by mouth daily.    Review of Systems  All other systems reviewed and are negative.   Social History   Tobacco Use  . Smoking status: Former Smoker    Types: Cigarettes    Last attempt to quit: 04/17/2005    Years since quitting: 13.3  . Smokeless tobacco: Never Used  Substance Use Topics  . Alcohol use: No    Alcohol/week: 14.0 standard drinks    Types: 14 Cans of beer per week    Comment: quit ~27mo ago   Objective:   BP (!) 153/86   Pulse 98   Temp  98.1 F (36.7 C)   Ht 5\' 2"  (1.575 m)   Wt 202 lb 14.4 oz (92 kg)   BMI 37.11 kg/m   Physical Exam  Constitutional: She appears well-developed and well-nourished.  Cardiovascular: Normal rate.  Pulmonary/Chest: Effort normal and breath sounds normal.  Abdominal: Soft. Bowel sounds are normal.  Skin: Skin is warm and dry.  Vitals reviewed.       Assessment & Plan:        BP elevated today- Continue current medications. will re-evaluate at next visit.   Continue meloxicam for neuropathic pain.  BMET today.   B12 injection today.    Routine visit in 2 months.    Jacelyn Pieah Doles-Johnson, NP   [facility] [place]

## 2018-08-02 NOTE — Progress Notes (Addendum)
Medication Management Clinic Visit Note  Patient: Renee Bush MRN: [id number] Date of Birth: September 27, 1960 PCP: Renee Axe, MD   [name] 57 y.o. female presents for a follow-up MTM visit today. Pt has not had any signs/symptoms of being sick or been around anyone that's been sick. Pt has not traveled outside of the country in the past month.  BP 132/78 (BP Location: Right Arm, Patient Position: Sitting, Cuff Size: Normal)   Ht 5\' 2"  (1.575 m)   Wt 203 lb (92.1 kg)   BMI 37.13 kg/m   Patient Information   Past Medical History:  Diagnosis Date  . Chronic kidney disease, stage III (moderate) (HCC) 10/06/2017  . Diabetes mellitus without complication (HCC)   . GERD (gastroesophageal reflux disease)   . Hypercholesteremia   . Hypertension 09/10/2015      Past Surgical History:  Procedure Laterality Date  . APPENDECTOMY    . CESAREAN SECTION  1991     Family History  Problem Relation Age of Onset  . COPD Mother   . Alzheimer's disease Mother   . Hypertension Father   . Hyperlipidemia Father   . Heart attack Father   . Diabetes type II Father   . Diabetes type II Sister   . Diabetes type II Sister   . Gestational diabetes Daughter   . Diabetes type II Sister     New Diagnoses (since last visit):   Family Support: Good  Lifestyle Diet: Breakfast: fruit or cereal or biscuit Lunch: sandwich or leftover Dinner: hotdogs or chicken and fries (airfryer) Drinks: sodas or diet green tea, and water  Exercise: Pt has recently started back babysitting grandchildren ~5 days a week. Stays active majority of the day with them.          Social History   Substance and Sexual Activity  Alcohol Use No  . Alcohol/week: 14.0 standard drinks  . Types: 14 Cans of beer per week   Comment: quit ~27mo ago      Social History   Tobacco Use  Smoking Status Former Smoker  . Types: Cigarettes  . Last attempt to quit: 04/17/2005  . Years since quitting: 13.3   Smokeless Tobacco Never Used      Health Maintenance  Topic Date Due  . Hepatitis C Screening  08-27-60  . PNEUMOCOCCAL POLYSACCHARIDE VACCINE AGE 49-64 HIGH RISK  07/22/1962  . FOOT EXAM  07/22/1970  . HIV Screening  07/23/1975  . PAP SMEAR  07/22/1981  . MAMMOGRAM  07/22/2010  . COLONOSCOPY  07/22/2010  . OPHTHALMOLOGY EXAM  09/07/2018  . HEMOGLOBIN A1C  01/01/2019  . TETANUS/TDAP  06/03/2025  . INFLUENZA VACCINE  Completed    Outpatient Encounter Medications as of 08/02/2018  Medication Sig  . aspirin EC 81 MG tablet Take 1 tablet (81 mg total) by mouth daily.  . Dulaglutide (TRULICITY) 0.75 MG/0.5ML SOPN Inject 0.75 mg into the skin once a week.  . ferrous sulfate (FEOSOL) 325 (65 FE) MG tablet Take 1 tablet (325 mg total) by mouth daily with breakfast.  . furosemide (LASIX) 20 MG tablet Take 1 tablet (20 mg total) by mouth daily.  Marland Kitchen gabapentin (NEURONTIN) 400 MG capsule Take 1 capsule (400 mg total) by mouth 3 (three) times daily. Take 2 caps at bedtime.  . insulin glargine (LANTUS) 100 UNIT/ML injection Inject 0.3 mLs (30 Units total) into the skin daily.  Marland Kitchen lisinopril (PRINIVIL,ZESTRIL) 10 MG tablet Take 1 tablet (10 mg total) by mouth daily.  Marland Kitchen  meloxicam (MOBIC) 7.5 MG tablet Take 1 tablet (7.5 mg total) by mouth daily.  . metFORMIN (GLUCOPHAGE) 1000 MG tablet Take 1 tablet (1,000 mg total) by mouth 2 (two) times daily with a meal.  . Metoprolol Tartrate 75 MG TABS Take 75 mg by mouth 2 (two) times daily.  . nitroGLYCERIN (NITROSTAT) 0.4 MG SL tablet Place 1 tablet (0.4 mg total) under the tongue every 5 (five) minutes as needed for chest pain.  . Omega-3 Fatty Acids (FISH OIL) 1000 MG CAPS Take 1,000 mg by mouth 2 (two) times daily.  Marland Kitchen. omeprazole (PRILOSEC) 20 MG capsule Take 1 capsule (20 mg total) by mouth 2 (two) times daily before a meal.  . pioglitazone (ACTOS) 30 MG tablet Take 1 tablet (30 mg total) by mouth daily.  . rosuvastatin (CRESTOR) 5 MG tablet Take 1  tablet (5 mg total) by mouth daily.   No facility-administered encounter medications on file as of 08/02/2018.    Health Maintenance/Date Completed  Last ED visit: several years Last Visit to PCP: last month Next Visit to PCP: today Specialist Visit: na Dental Exam: several years Eye Exam: next month Prostate Exam: na Pelvic/PAP Exam: several years Mammogram: several years DEXA: na Colonoscopy: no Flu Vaccine: yes Pneumonia Vaccine: no  Assessment and Plan: Adherence/Compliance: Pt uses pill organizer and does not miss any doses. Pt was well educated on all medications. Pt reports no issues/side effects with any medications.  DM: trulicity, lantus, metformin 1000mg , pioglitazone 30mg  Pt's DM is well managed on current regimen. Pt takes trulicity every Sunday and said this medication has helped her tremendously. 07-03-18: A1c 7.3%, BG 89  Neuropathy: gabapentin 400mg , meloxicam 7.5mg  Pt recently started meloxicam and feels that her neuropathy is now under control on current regimen.  HTN: lisinopril 10mg , metoprolol tartrate 75mg  Pt well managed on current therapy. BP 07-05-18: 112/84, today: 132/78   Hyperlipidemia: rosuvastatin 5mg  Pt well managed on current therapy. Lipid Panel 07-03-18: TC 131, TG 105, HDL 41, LDL 69  GERD: omeprazole 20mg  Pt reports feeling well managed on current therapy.  Return to clinic in one year for annual MTM.   Angeline SlimKarissa Jmarion Christiano, PharmD Candidate [employer]   Cosigned: Iona BeardKeri K. Joelene MillinHarrison, BS, PharmD [facility] Clinic-Pharmacy Operations Coordinator [phone]

## 2018-08-02 NOTE — Addendum Note (Signed)
Addended by: Dustin FlockMAYNOR, Maecy Podgurski D on: 08/02/2018 08:11 PM   Modules accepted: Orders

## 2018-08-03 LAB — BASIC METABOLIC PANEL
BUN / CREAT RATIO: 15 (ref 9–23)
BUN: 14 mg/dL (ref 6–24)
CO2: 24 mmol/L (ref 20–29)
CREATININE: 0.93 mg/dL (ref 0.57–1.00)
Calcium: 9 mg/dL (ref 8.7–10.2)
Chloride: 106 mmol/L (ref 96–106)
GFR, EST AFRICAN AMERICAN: 78 mL/min/{1.73_m2} (ref >59)
GFR, EST NON AFRICAN AMERICAN: 68 mL/min/{1.73_m2} (ref >59)
Glucose: 163 mg/dL — ABNORMAL HIGH (ref 65–99)
Potassium: 5.1 mmol/L (ref 3.5–5.2)
Sodium: 145 mmol/L — ABNORMAL HIGH (ref 134–144)

## 2018-08-10 ENCOUNTER — Telehealth: Payer: Self-pay | Admitting: Pharmacist

## 2018-08-10 NOTE — Telephone Encounter (Signed)
08/10/2018 10:08:56 AM - LANTUS SOLOSTAR REFILL  08/10/18 Faxed [employer] refill request for Lantus Solostar Inject 30 units daily at bedtime # 2 boxes.Forde RadonAJ

## 2018-08-13 ENCOUNTER — Other Ambulatory Visit: Payer: Self-pay

## 2018-08-14 ENCOUNTER — Telehealth: Payer: Self-pay | Admitting: Pharmacist

## 2018-08-14 NOTE — Telephone Encounter (Signed)
08/14/2018 11:22:03 AM - Trulicity refill to provider  08/14/18 Printed [employer] refill request for [employer] Inject 0.75mg  once a week, [facility].Forde RadonAJ

## 2018-08-30 ENCOUNTER — Ambulatory Visit: Payer: Self-pay

## 2018-09-06 ENCOUNTER — Ambulatory Visit: Payer: Self-pay | Admitting: Ophthalmology

## 2018-09-06 ENCOUNTER — Other Ambulatory Visit: Payer: Self-pay | Admitting: Ophthalmology

## 2018-09-06 LAB — HM DIABETES EYE EXAM

## 2018-09-07 ENCOUNTER — Telehealth: Payer: Self-pay | Admitting: Pharmacist

## 2018-09-07 NOTE — Telephone Encounter (Signed)
09/07/2018 11:10:57 AM - Trulicity refill to [facility]  09/07/2018 Faxed Lilly refill request for Trulicity 0.75mg  Inject once a week.Forde Radon

## 2018-09-27 ENCOUNTER — Other Ambulatory Visit: Payer: Self-pay

## 2018-09-27 DIAGNOSIS — E1140 Type 2 diabetes mellitus with diabetic neuropathy, unspecified: Secondary | ICD-10-CM

## 2018-09-27 DIAGNOSIS — Z794 Long term (current) use of insulin: Secondary | ICD-10-CM

## 2018-09-27 DIAGNOSIS — E538 Deficiency of other specified B group vitamins: Secondary | ICD-10-CM

## 2018-09-27 MED ORDER — CYANOCOBALAMIN 1000 MCG/ML IJ SOLN
1000.0000 ug | Freq: Once | INTRAMUSCULAR | Status: AC
  Administered 2018-09-27: 1000 ug via INTRAMUSCULAR

## 2018-09-28 LAB — COMPREHENSIVE METABOLIC PANEL
ALT: 13 IU/L (ref 0–32)
AST: 10 IU/L (ref 0–40)
Albumin/Globulin Ratio: 2.1 (ref 1.2–2.2)
Albumin: 3.9 g/dL (ref 3.8–4.9)
Alkaline Phosphatase: 52 IU/L (ref 39–117)
BUN/Creatinine Ratio: 16 (ref 9–23)
BUN: 18 mg/dL (ref 6–24)
Bilirubin Total: <0.2 mg/dL (ref 0.0–1.2)
CO2: 26 mmol/L (ref 20–29)
Calcium: 9.2 mg/dL (ref 8.7–10.2)
Chloride: 101 mmol/L (ref 96–106)
Creatinine, Ser: 1.14 mg/dL — ABNORMAL HIGH (ref 0.57–1.00)
GFR calc Af Amer: 61 mL/min/{1.73_m2} (ref >59)
GFR calc non Af Amer: 53 mL/min/{1.73_m2} — ABNORMAL LOW (ref >59)
Globulin, Total: 1.9 g/dL (ref 1.5–4.5)
Glucose: 124 mg/dL — ABNORMAL HIGH (ref 65–99)
Potassium: 5 mmol/L (ref 3.5–5.2)
Sodium: 140 mmol/L (ref 134–144)
Total Protein: 5.8 g/dL — ABNORMAL LOW (ref 6.0–8.5)

## 2018-09-28 LAB — HEMOGLOBIN A1C
Est. average glucose Bld gHb Est-mCnc: 137 mg/dL
HEMOGLOBIN A1C: 6.4 % — AB (ref 4.8–5.6)

## 2018-10-04 ENCOUNTER — Ambulatory Visit: Payer: Self-pay

## 2018-10-05 ENCOUNTER — Telehealth: Payer: Self-pay | Admitting: Pharmacist

## 2018-10-05 NOTE — Telephone Encounter (Signed)
10/05/2018 10:12:10 AM - Lantus Solostar refill  10/05/2018 Taking [employer] refill for Lantus Solostar Inject 30 units daily at bedtime #2 to [facility] for Teah to sign.Forde Radon

## 2018-10-11 ENCOUNTER — Ambulatory Visit: Payer: Self-pay | Admitting: Licensed Clinical Social Worker

## 2018-10-11 ENCOUNTER — Ambulatory Visit: Payer: Self-pay | Admitting: Adult Health Nurse Practitioner

## 2018-10-11 VITALS — BP 137/86 | HR 101 | Temp 98.2°F | Ht 63.5 in | Wt 203.5 lb

## 2018-10-11 DIAGNOSIS — N183 Chronic kidney disease, stage 3 unspecified: Secondary | ICD-10-CM

## 2018-10-11 DIAGNOSIS — I10 Essential (primary) hypertension: Secondary | ICD-10-CM

## 2018-10-11 DIAGNOSIS — F411 Generalized anxiety disorder: Secondary | ICD-10-CM

## 2018-10-11 DIAGNOSIS — F332 Major depressive disorder, recurrent severe without psychotic features: Secondary | ICD-10-CM

## 2018-10-11 DIAGNOSIS — E1140 Type 2 diabetes mellitus with diabetic neuropathy, unspecified: Secondary | ICD-10-CM

## 2018-10-11 DIAGNOSIS — E7800 Pure hypercholesterolemia, unspecified: Secondary | ICD-10-CM

## 2018-10-11 DIAGNOSIS — Z794 Long term (current) use of insulin: Principal | ICD-10-CM

## 2018-10-11 MED ORDER — MELOXICAM 7.5 MG PO TABS: 7.5000 mg | ORAL_TABLET | Freq: Every day | ORAL | 1 refills | Status: DC

## 2018-10-11 MED ORDER — OMEPRAZOLE 20 MG PO CPDR: 20.0000 mg | DELAYED_RELEASE_CAPSULE | Freq: Two times a day (BID) | ORAL | 2 refills | Status: DC

## 2018-10-11 MED ORDER — GABAPENTIN 400 MG PO CAPS: 400.0000 mg | ORAL_CAPSULE | Freq: Three times a day (TID) | ORAL | 1 refills | Status: DC

## 2018-10-11 NOTE — Progress Notes (Signed)
Patient: Renee Bush Female    DOB: 10-30-1960   58 y.o.   MRN: [id number] Visit Date: 10/11/2018  Today's Provider: [facility]   Chief Complaint  Patient presents with  . Follow-up    f/u of labs   Subjective:    HPI  Here for lab review.  Taking medications as directed.   Being followed by Herbert Seta for issues surrounding her husband's cancer that is now I remission.     Allergies  Allergen Reactions  . Etodolac Rash  . Penicillin G Rash  . Penicillins Rash    Has patient had a PCN reaction causing immediate rash, facial/tongue/throat swelling, SOB or lightheadedness with hypotension: Yes Has patient had a PCN reaction causing severe rash involving mucus membranes or skin necrosis: No Has patient had a PCN reaction that required hospitalization: No Has patient had a PCN reaction occurring within the last 10 years: No If all of the above answers are "NO", then may proceed with Cephalosporin use.   Previous Medications   ASPIRIN EC 81 MG TABLET    Take 1 tablet (81 mg total) by mouth daily.   DULAGLUTIDE (TRULICITY) 0.75 MG/0.5ML SOPN    Inject 0.75 mg into the skin once a week.   FERROUS SULFATE (FEOSOL) 325 (65 FE) MG TABLET    Take 1 tablet (325 mg total) by mouth daily with breakfast.   FUROSEMIDE (LASIX) 20 MG TABLET    Take 1 tablet (20 mg total) by mouth daily.   GABAPENTIN (NEURONTIN) 400 MG CAPSULE    Take 1 capsule (400 mg total) by mouth 3 (three) times daily. Take 2 caps at bedtime.   INSULIN GLARGINE (LANTUS) 100 UNIT/ML INJECTION    Inject 0.3 mLs (30 Units total) into the skin daily.   LISINOPRIL (PRINIVIL,ZESTRIL) 10 MG TABLET    Take 1 tablet (10 mg total) by mouth daily.   MELOXICAM (MOBIC) 7.5 MG TABLET    Take 1 tablet (7.5 mg total) by mouth daily.   METFORMIN (GLUCOPHAGE) 1000 MG TABLET    Take 1 tablet (1,000 mg total) by mouth 2 (two) times daily with a meal.   METOPROLOL TARTRATE 75 MG TABS    Take 75 mg by mouth 2 (two) times  daily.   NITROGLYCERIN (NITROSTAT) 0.4 MG SL TABLET    Place 1 tablet (0.4 mg total) under the tongue every 5 (five) minutes as needed for chest pain.   OMEGA-3 FATTY ACIDS (FISH OIL) 1000 MG CAPS    Take 1,000 mg by mouth 2 (two) times daily.   OMEPRAZOLE (PRILOSEC) 20 MG CAPSULE    Take 1 capsule (20 mg total) by mouth 2 (two) times daily before a meal.   PIOGLITAZONE (ACTOS) 30 MG TABLET    Take 1 tablet (30 mg total) by mouth daily.   ROSUVASTATIN (CRESTOR) 5 MG TABLET    Take 1 tablet (5 mg total) by mouth daily.    Review of Systems  All other systems reviewed and are negative.   Social History   Tobacco Use  . Smoking status: Former Smoker    Types: Cigarettes    Last attempt to quit: 04/17/2005    Years since quitting: 13.4  . Smokeless tobacco: Never Used  Substance Use Topics  . Alcohol use: No    Alcohol/week: 14.0 standard drinks    Types: 14 Cans of beer per week    Comment: quit ~79mo ago   Objective:   BP 137/86   Pulse Marland Kitchen)  101   Temp 98.2 F (36.8 C)   Ht 5' 3.5" (1.613 m)   Wt 203 lb 8 oz (92.3 kg)   BMI 35.48 kg/m   Physical Exam Vitals signs reviewed.  Constitutional:      Appearance: Normal appearance.  HENT:     Head: Normocephalic and atraumatic.  Cardiovascular:     Rate and Rhythm: Normal rate and regular rhythm.  Pulmonary:     Effort: Pulmonary effort is normal.     Breath sounds: Normal breath sounds.  Abdominal:     General: Bowel sounds are normal.     Palpations: Abdomen is soft.  Neurological:     Mental Status: She is alert.         Assessment & Plan:         DM:  Controlled.  Encourage diabetic diet and exercise.  Continue current medication regimen.   HLD: .  Continue current regimen.  Encourage low cholesterol, low fat diet and exercise.   HTN:  Controlled.  Goal BP <140/80.  Continue current medication regimen.  Encourage low salt diet and exercise.        [facility]   [facility]  [place]

## 2018-10-11 NOTE — BH Specialist Note (Signed)
Integrated Behavioral Health Comprehensive Clinical Assessment  MRN: [id number] Name: HOLLIN ATHA  Type of Service: Integrated Behavioral Health-Individual Interpretor: No. Interpretor Name and Language: Not applicable.  PRESENTING CONCERNS: ELIESE SHOJI is a 58 y.o. female accompanied by herself.[name] was referred to [facility] clinician for mental health and was self referred.  Previous mental health services Have you ever been treated for a mental health problem? Yes If "Yes", when were you treated and whom did you see? Ms. Finklea ws previously prescribed psychotropic medication in the past for mental health approximately 10 to 12 years ago but is unable to remember the name or dosage of the medication.  Have you ever been hospitalized for mental health treatment? Negative Have you ever been treated for any of the following? Past Psychiatric History/Hospitalization(s): Anxiety: Yes Ms. Lambeth has been dealing with anxiety for the last year after her husband was diagnosed with mouth cancer and is now in full remission. She explains that she feels nervous, anxious, or on edge nearly every day, difficulty concentrating, difficulty sleeping, irritability, difficulty relaxing, and feeling afraid as if something awful might happen. Bipolar Disorder: Negative Depression: Yes Ms. Garzon has been dealing with depression for the last year when her husband was diagnosed with mouth cancer and is now in full remission. Her symptoms include: feeling down and depressed nearly every day, loss of interest in previously enjoyed activities, difficulty falling asleep, overeating, feeling bad about herself, restlessness, and difficulty concentrating. She denies suicidal and homicidal thoughts.  Mania: Negative Psychosis: Negative Schizophrenia: Negative Personality Disorder: Negative Hospitalization for psychiatric illness: Negative History of  Electroconvulsive Shock Therapy: Negative Prior Suicide Attempts: Negative Have you ever had thoughts of harming yourself or others or attempted suicide? No plan to harm self or others  Medical history  has a past medical history of Chronic kidney disease, stage III (moderate) (HCC) (10/06/2017), Diabetes mellitus without complication (HCC), GERD (gastroesophageal reflux disease), Hypercholesteremia, and Hypertension (09/10/2015). Primary Care Physician: Virl Axe, MD Date of last physical exam: 08/02/2018 Allergies:  Allergies  Allergen Reactions  . Etodolac Rash  . Penicillin G Rash  . Penicillins Rash    Has patient had a PCN reaction causing immediate rash, facial/tongue/throat swelling, SOB or lightheadedness with hypotension: Yes Has patient had a PCN reaction causing severe rash involving mucus membranes or skin necrosis: No Has patient had a PCN reaction that required hospitalization: No Has patient had a PCN reaction occurring within the last 10 years: No If all of the above answers are "NO", then may proceed with Cephalosporin use.   Current medications:  Outpatient Encounter Medications as of 10/11/2018  Medication Sig  . aspirin EC 81 MG tablet Take 1 tablet (81 mg total) by mouth daily.  . Dulaglutide (TRULICITY) 0.75 MG/0.5ML SOPN Inject 0.75 mg into the skin once a week.  . ferrous sulfate (FEOSOL) 325 (65 FE) MG tablet Take 1 tablet (325 mg total) by mouth daily with breakfast.  . furosemide (LASIX) 20 MG tablet Take 1 tablet (20 mg total) by mouth daily.  Marland Kitchen gabapentin (NEURONTIN) 400 MG capsule Take 1 capsule (400 mg total) by mouth 3 (three) times daily. Take 2 caps at bedtime.  . insulin glargine (LANTUS) 100 UNIT/ML injection Inject 0.3 mLs (30 Units total) into the skin daily.  Marland Kitchen lisinopril (PRINIVIL,ZESTRIL) 10 MG tablet Take 1 tablet (10 mg total) by mouth daily.  . meloxicam (MOBIC) 7.5 MG tablet Take 1 tablet (7.5 mg total) by mouth  daily.  . metFORMIN  (GLUCOPHAGE) 1000 MG tablet Take 1 tablet (1,000 mg total) by mouth 2 (two) times daily with a meal.  . Metoprolol Tartrate 75 MG TABS Take 75 mg by mouth 2 (two) times daily.  . nitroGLYCERIN (NITROSTAT) 0.4 MG SL tablet Place 1 tablet (0.4 mg total) under the tongue every 5 (five) minutes as needed for chest pain.  . Omega-3 Fatty Acids (FISH OIL) 1000 MG CAPS Take 1,000 mg by mouth 2 (two) times daily.  Marland Kitchen. omeprazole (PRILOSEC) 20 MG capsule Take 1 capsule (20 mg total) by mouth 2 (two) times daily before a meal.  . pioglitazone (ACTOS) 30 MG tablet Take 1 tablet (30 mg total) by mouth daily.  . rosuvastatin (CRESTOR) 5 MG tablet Take 1 tablet (5 mg total) by mouth daily.   No facility-administered encounter medications on file as of 10/11/2018.    Have you ever had any serious medication reactions? Yes- Penicillin and Etodolac. Is there any history of mental health problems or substance abuse in your family? No Has anyone in your family been hospitalized for mental health treatment? No  Social/family history Who lives in your current household? Ms. Revonda Standardllison lives with her husband. She has been married to her husband since [id number]. What is your family of origin, childhood history? Ms. Revonda [place] was [place], [place]. Where were you born[place]. Where did you grow up? Ms. Revonda [place]. How many different homes have you lived in? A few. Describe your childhood: Ms. Revonda Standardllison reports that her parents argued a lot when she was growing up. She reports that they always had everything they needed. Do you have siblings, step/half siblings? Yes- Ms. Revonda Standardllison has four sisters. She is the second to the youngest.  What are their names, relation, sex, age? Ms. Revonda Standardllison did not disclose this information. Are your parents separated or divorced? No Ms. Revonda Standardllison reports that her mom died 14 years ago and her dad passed away in 1997. What are your social supports? Ms.  Revonda Standardllison reports that she relies on her best friend Britta MccreedyBarbara for support.  Education How many grades have you completed? 11th grade Did you have any problems in school? No  Employment/financial issues Ms. Revonda Standardllison previously worked for [employer] for three to four years and until she was let go due to too many absences because of health issues.  Sleep Usual bedtime varies. Sleeping arrangements: Alone. Problems with snoring: No Obstructive sleep apnea is not a concern. Problems with nightmares: No Problems with night terrors: No Problems with sleepwalking: No  Trauma/Abuse history Have you ever experienced or been exposed to any form of abuse? No Have you ever experienced or been exposed to something traumatic? No  Substance use Do you use alcohol, nicotine or caffeine? no tobacco use How old were you when you first tasted alcohol? Ms. Revonda Standardllison denies using or abusing drugs or alcohol. She reports that she hasn't drank in a very long time. She is a former smoker and hasn't smoked for 15 years.  Have you ever used illicit drugs or abused prescription medications? Ms. Revonda Standardllison denies using or abusing drugs and alcohol. She has not previously been in substance abuse treatment.   Mental status General appearance/Behavior: Casual Eye contact: Good Motor behavior: Normal Speech: Normal Level of consciousness: Alert Mood: Euthymic Affect: Appropriate Anxiety level: None Thought process: Coherent Thought content: WNL Perception: Normal Judgment: Good Insight: Present  Diagnosis No diagnosis found.  GOALS ADDRESSED: Patient will  reduce symptoms of: anxiety and depression and increase knowledge and/or ability of: coping skills, healthy habits, self-management skills and stress reduction and also: Increase healthy adjustment to current life circumstances              INTERVENTIONS: Interventions utilized: Supportive Counseling Standardized Assessments completed: GAD-7 and PHQ  9   ASSESSMENT/OUTCOME:  Kamelah Zeilinger is a 58 year old Caucasian female who presents today for a mental health assessment and was self referred. Ms. Carlston reports that she has been dealing with anxiety and depression for the last year. She explains that her husband was diagnosed with mouth cancer and she was his primary caregiver. She explains that he is in full remission but due to him no longer being able to work that he takes his anger and frustration out on her. She reports that she has not seen a therapist in the past or been diagnosed with a mental illness. She has never been hospitalized for mental illness. She reports that she was previously prescribed something 10 to 12 years ago but is unable to remember the name or dosage of the medication. She denies abusing drugs or alcohol.   Ms. Mulrooney is a existing patient at [facility]. She has a history of type II diabetes, hypertension, GERD, chronic kidney disease stage III, and insomnia. She has had adverse drug reactions to Penicillin and Etodolac. She is a former smoker and hasn't smoke for 15 years.  Ms. Saldana was born and raised in [place], [place] and was raised by bother her parents. She has four sisters and is the second to the oldest. She has been married to her husband since 14. She was previously married prior to that for four years. She has two daughters, a son, and two step children. She has 13 grandchildren and one great granddaughter. She is unemployed and relies on her husband's disability for income. She previously worked for [employer] for three to four years until she was let go due to excessive absences because of health issues. 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 She relies on her best friend Britta Mccreedy for support. She denies a history of substance abuse and mental illness in the family.   PLAN: Case consultation with Dr. Mare Ferrari, MD, Psychiatric consultant on 10/17/2018.  Follow up in two weeks for cognitive behavioral therapy with Carey Bullocks, LCSW focusing on anxiety, depression, and stress.   Scheduled next visit: 2 weeks  Althia Forts Clinical Social Work

## 2018-10-17 ENCOUNTER — Telehealth: Payer: Self-pay | Admitting: Pharmacist

## 2018-10-17 NOTE — Telephone Encounter (Signed)
10/17/2018 12:32:54 PM - Lantus solostar refill  10/17/2018 Faxed [employer]ofi refill request for Lantus Solostar Inject 30 units daily at bedtime #2.Forde Radon

## 2018-10-25 ENCOUNTER — Ambulatory Visit: Payer: Self-pay

## 2018-10-25 ENCOUNTER — Ambulatory Visit: Payer: Self-pay | Admitting: Licensed Clinical Social Worker

## 2018-10-25 DIAGNOSIS — F332 Major depressive disorder, recurrent severe without psychotic features: Secondary | ICD-10-CM

## 2018-10-25 DIAGNOSIS — F411 Generalized anxiety disorder: Secondary | ICD-10-CM

## 2018-10-25 NOTE — BH Specialist Note (Signed)
Integrated Behavioral Health Follow Up Visit  MRN: [id number] Name: Renee Bush  Number of Integrated Behavioral Health Clinician visits: 1/6 Type of Service: Integrated Behavioral Health- Individual/Family Interpretor:No. Interpretor Name and Language: Not applicable.   SUBJECTIVE: Renee Bush is a 58 y.o. female accompanied by herself. Patient was self referred for mental health. Patient reports the following symptoms/concerns: She reports that she feels like she needs something to manage her symptoms of depression and anxiety. She explains that the night before she baby sits her grandchildren that she finds it almost impossible to fall asleep and get up on time the next morning. She describes crying a lot, overeating, doesn't have any healthy outlets when stressed, and looks after everyone else before she takes care of her own needs. She notes that the last time she did something for enjoyment was going to the beach in October of 2019. She denies suicidal and homicidal thoughts.  Duration of problem: ; Severity of problem: moderate  OBJECTIVE: Mood: Euthymic and Affect: Appropriate Risk of harm to self or others: No plan to harm self or others  LIFE CONTEXT: Family and Social: See above. School/Work: Ms. Azbill is unemployed. Self-Care: A1C has decreased to 6.4.  Life Changes: See above.  GOALS ADDRESSED: Patient will: 1.  Reduce symptoms of: anxiety, depression, insomnia and stress  2.  Increase knowledge and/or ability of: coping skills  3.  Demonstrate ability to: Increase healthy adjustment to current life circumstances  INTERVENTIONS: Interventions utilized:  Brief CBT was utilized by the clinician by discussing the concepts of sleep hygiene and relaxation techniques. Clinician discussed with the patient regarding if she has ever heard of the concept of sleep hygiene; going to bed the same time each night, waking up the same time each morning, having a routine  around bed time, and when she can't sleep not lying in bed or tossing or turning. Clinician explained that studies show that if you do anything in your bed besides sleep that your brain stops associating your bed with sleep. Clinician asked the patient when was the last time she did something for enjoyment. Clinician processed with the patient regarding how she feels that she is managing her stress. Clinician discussed relaxation techniques and provided the patient with a hand out.  Standardized Assessments completed: GAD-7 and PHQ 9 Anxiety is at a 17 and depression is at a 19. ASSESSMENT: Patient currently experiencing symptoms of stress due to things that are out of her control.   Patient may benefit from continuing with therapy.  PLAN: 1. Follow up with behavioral health clinician on : two weeks or earlier if needed. 2. Behavioral recommendations: Practice sleep hygiene handout and discussed different ways to relax. Case consultation with Angelita Ingles, MD Psychiatric Consultant on Wednesday March 4th 2020. 3. Referral(s): Integrated [facility] (In Clinic) 4. "From scale of 1-10, how likely are you to follow plan?": 8  Althia Forts, LCSW

## 2018-11-01 ENCOUNTER — Telehealth: Payer: Self-pay | Admitting: Licensed Clinical Social Worker

## 2018-11-01 DIAGNOSIS — F411 Generalized anxiety disorder: Secondary | ICD-10-CM

## 2018-11-01 DIAGNOSIS — F332 Major depressive disorder, recurrent severe without psychotic features: Secondary | ICD-10-CM

## 2018-11-01 NOTE — BH Specialist Note (Signed)
Case consultation with Dr. Mare Ferrari, MD, Psychiatrist on March 4th 2020 recommended that the patient can have a choice to which psychotropic medication that she takes: Prozac 20 mg PO daily, ask how often she is taking the Gabapentin or Mirtazapine 15 at bedtime.   Clinician reached out to the patient to let her know of the case consultation results and recommendations.   Patient stated that she would prefer to try the Mirtazapine 15 mg at bedtime.

## 2018-11-02 ENCOUNTER — Other Ambulatory Visit: Payer: Self-pay | Admitting: Adult Health Nurse Practitioner

## 2018-11-02 DIAGNOSIS — Z794 Long term (current) use of insulin: Principal | ICD-10-CM

## 2018-11-02 DIAGNOSIS — E1140 Type 2 diabetes mellitus with diabetic neuropathy, unspecified: Secondary | ICD-10-CM

## 2018-11-06 ENCOUNTER — Other Ambulatory Visit: Payer: Self-pay

## 2018-11-06 MED ORDER — MIRTAZAPINE 15 MG PO TABS: 15.0000 mg | ORAL_TABLET | Freq: Every day | ORAL | 0 refills | Status: DC

## 2018-11-08 ENCOUNTER — Ambulatory Visit: Payer: Self-pay | Admitting: Licensed Clinical Social Worker

## 2018-11-08 ENCOUNTER — Telehealth: Payer: Self-pay | Admitting: Licensed Clinical Social Worker

## 2018-11-08 NOTE — Telephone Encounter (Signed)
Clinician contacted the patient to conduct a preliminary screening for the coronavirus and the flu. She describes cough, runny nose, and shortness of breath. She has been exposed to the flu. Her appointment was cancelled and rescheduled for two weeks on March 26th. She was advised to go to the nearest emergency room to get tested for the flu and virus as a precaution.

## 2018-11-22 ENCOUNTER — Ambulatory Visit: Payer: Self-pay

## 2018-11-22 ENCOUNTER — Other Ambulatory Visit: Payer: Self-pay

## 2018-11-22 ENCOUNTER — Ambulatory Visit: Payer: Self-pay | Admitting: Licensed Clinical Social Worker

## 2018-11-22 DIAGNOSIS — F411 Generalized anxiety disorder: Secondary | ICD-10-CM

## 2018-11-22 DIAGNOSIS — F332 Major depressive disorder, recurrent severe without psychotic features: Secondary | ICD-10-CM

## 2018-11-22 NOTE — BH Specialist Note (Signed)
Integrated Behavioral Health Follow Up Phone Visit  MRN: [id number] Name: Renee Bush  Number of Integrated Behavioral Health Clinician visits: 2/6  Type of Service: Integrated Behavioral Health- Individual/Family Interpretor:No. Interpretor Name and Language: not applicable  SUBJECTIVE: Renee Bush is a 58 y.o. female accompanied by herself. Patient was self referred for mental health. Patient reports the following symptoms/concerns: She reports that she has noticed some difference in her mood since the last time she was seen. She explains that she is no longer isolating in her bedroom and is back to doing some things for enjoyment. She notes that her sleep has improved. She explains that she is worried about her grandson who has been having seizures but other than that has been taking things a day at a time. She denies suicidal and homicidal thoughts.  Duration of problem: ; Severity of problem: moderate  OBJECTIVE: Mood: Euthymic and Affect: Appropriate Risk of harm to self or others: No plan to harm self or others  LIFE CONTEXT: Family and Social: See above School/Work: See above Self-Care: See above Life Changes: See above.   GOALS ADDRESSED: Patient will: 1.  Reduce symptoms of: anxiety  2.  Increase knowledge and/or ability of: coping skills  3.  Demonstrate ability to: Increase healthy adjustment to current life circumstances  INTERVENTIONS: Interventions utilized:  Brief CBT was utilized by the clinician focusing on her anxiety and affect on normal cognition. Clinician processed with the patient regarding how she has been doing since the last follow up session. Clinician asked the patient if she has noticed a difference in her symptoms of anxiety and depression. Clinician explained that its a strange time for everyone and encouraged her to take things one day at a time. Clinician explained that she understands that when you are faced with difficulties in life  that its easy to become discouraged and only focus on the negative rather than the positive. Clinician explained that she is glad to hear that she is no longer isolating herself and is finding things to do for enjoyment. Clinician explained that she hopes her grandson symptom's improve. Clinician encouraged the patient to continue to utilize her coping skills.  Standardized Assessments completed: GAD-7 and PHQ 9  ASSESSMENT: Patient currently experiencing symptoms of anxiety due to stress .   Patient may benefit from continuing with therapy and utilizing coping skills .  PLAN: 1. Follow up with behavioral health clinician on : two weeks or earlier if needed. 2. Behavioral recommendations: See above 3. Referral(s): Integrated [employer] (In Clinic) 4. "From scale of 1-10, how likely are you to follow plan?": 8  Althia Forts, LCSW

## 2018-11-23 ENCOUNTER — Other Ambulatory Visit: Payer: Self-pay | Admitting: Internal Medicine

## 2018-11-23 DIAGNOSIS — Z794 Long term (current) use of insulin: Principal | ICD-10-CM

## 2018-11-23 DIAGNOSIS — E1140 Type 2 diabetes mellitus with diabetic neuropathy, unspecified: Secondary | ICD-10-CM

## 2018-12-03 ENCOUNTER — Telehealth: Payer: Self-pay | Admitting: Pharmacist

## 2018-12-03 NOTE — Telephone Encounter (Signed)
12/03/2018 11:09:46 AM - Trulicity refill  12/03/2018 Taking [employer] refill request to [facility] for provider to sign for Trulicity Inject 0.75mg  once a week #5.Forde Radon

## 2018-12-11 ENCOUNTER — Ambulatory Visit: Payer: Self-pay | Admitting: Licensed Clinical Social Worker

## 2018-12-11 ENCOUNTER — Encounter: Payer: Self-pay | Admitting: Licensed Clinical Social Worker

## 2018-12-11 ENCOUNTER — Other Ambulatory Visit: Payer: Self-pay

## 2018-12-11 DIAGNOSIS — F332 Major depressive disorder, recurrent severe without psychotic features: Secondary | ICD-10-CM

## 2018-12-11 DIAGNOSIS — F411 Generalized anxiety disorder: Secondary | ICD-10-CM
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
## 2018-12-12 ENCOUNTER — Other Ambulatory Visit: Payer: Self-pay

## 2018-12-12 MED ORDER — MIRTAZAPINE 15 MG PO TABS: 15.0000 mg | ORAL_TABLET | Freq: Every day | ORAL | 2 refills | Status: DC

## 2018-12-25 ENCOUNTER — Ambulatory Visit: Payer: Self-pay | Admitting: Licensed Clinical Social Worker

## 2018-12-25 ENCOUNTER — Other Ambulatory Visit: Payer: Self-pay

## 2018-12-25 DIAGNOSIS — F332 Major depressive disorder, recurrent severe without psychotic features: Secondary | ICD-10-CM

## 2018-12-25 DIAGNOSIS — F411 Generalized anxiety disorder: Secondary | ICD-10-CM

## 2018-12-25 NOTE — BH Specialist Note (Signed)
Integrated Behavioral Health Follow Up Phone Visit  MRN: [id number] Name: Renee Bush  Number of Integrated Behavioral Health Clinician visits: 4/6  Type of Service: Integrated Behavioral Health- Individual/Family Interpretor:No. Interpretor Name and Language: Not applicable.  SUBJECTIVE: Renee Bush is a 58 y.o. female accompanied by herself. Patient was self referred by mental health. Patient reports the following symptoms/concerns: She reports that she has been doing okay since the last session. She reports that she has been keeping busy with babysitting her grandchildren and projects around the house. She explains that the only thing that is stressing her at the moment is not being able to get a hair cut due to COVID 19. She explains that any anxiety that she does have lately is due to finances but is managing it. She reports that she has been getting along better with her husband. She denies suicidal and homicidal thoughts.  Duration of problem: ; Severity of problem: mild  OBJECTIVE: Mood: Euthymic and Affect: Appropriate Risk of harm to self or others: No plan to harm self or others  LIFE CONTEXT: Family and Social: see above. School/Work:  Self-Care: see above Life Changes: see above  GOALS ADDRESSED: Patient will: 1.  Reduce symptoms of: anxiety  2.  Increase knowledge and/or ability of: stress reduction  3.  Demonstrate ability to: Increase healthy adjustment to current life circumstances  INTERVENTIONS: Interventions utilized:  Brief CBT was utilized by the clinician focusing on anxiety and affect on normal cognition. Clinician processed with the patient regarding how she has been doing since the last follow up session. Clinician asked the patient how she has been doing in terms of her anxiety and depression. Clinician explained that the key to managing your anxiety is focusing on what you can control versus what you cannot control. Clinician explained that  in terms of finances that sometimes you can control spending but you can also have unexpected things pop up. Clinician suggested that the patient try to focus more on the positive than the negative. Standardized Assessments completed: GAD-7 and PHQ 9  ASSESSMENT: Patient currently experiencing see above .   Patient may benefit from see above.  PLAN: 1. Follow up with behavioral health clinician on : three weeks or earlier if needed. 2. Behavioral recommendations: see above. 3. Referral(s): Integrated [employer] (In Clinic) 4. "From scale of 1-10, how likely are you to follow plan?":   Renee Forts, LCSW

## 2019-01-03 ENCOUNTER — Other Ambulatory Visit: Payer: Self-pay

## 2019-01-04 ENCOUNTER — Telehealth: Payer: Self-pay | Admitting: Pharmacist

## 2019-01-04 NOTE — Telephone Encounter (Signed)
01/04/2019 9:02:55 AM - Trulicity refill  01/04/2019 Faxed [employer] refill request for Trulicity Inject 0.75mg  once a week.Forde Radon

## 2019-01-10 ENCOUNTER — Telehealth: Payer: Self-pay | Admitting: Pharmacist

## 2019-01-10 ENCOUNTER — Ambulatory Visit: Payer: Self-pay | Admitting: Adult Health Nurse Practitioner

## 2019-01-10 ENCOUNTER — Other Ambulatory Visit: Payer: Self-pay

## 2019-01-10 DIAGNOSIS — E7800 Pure hypercholesterolemia, unspecified: Secondary | ICD-10-CM

## 2019-01-10 DIAGNOSIS — E538 Deficiency of other specified B group vitamins: Secondary | ICD-10-CM

## 2019-01-10 DIAGNOSIS — I10 Essential (primary) hypertension: Secondary | ICD-10-CM

## 2019-01-10 DIAGNOSIS — E1140 Type 2 diabetes mellitus with diabetic neuropathy, unspecified: Secondary | ICD-10-CM

## 2019-01-10 DIAGNOSIS — E1159 Type 2 diabetes mellitus with other circulatory complications: Secondary | ICD-10-CM

## 2019-01-10 MED ORDER — INSULIN GLARGINE 100 UNIT/ML ~~LOC~~ SOLN: 30.0000 [IU] | Freq: Every day | SUBCUTANEOUS | 11 refills | Status: DC

## 2019-01-10 MED ORDER — LISINOPRIL 10 MG PO TABS: 10.0000 mg | ORAL_TABLET | Freq: Every day | ORAL | 1 refills | Status: DC

## 2019-01-10 MED ORDER — GABAPENTIN 400 MG PO CAPS: 400.0000 mg | ORAL_CAPSULE | Freq: Three times a day (TID) | ORAL | 1 refills | Status: DC

## 2019-01-10 MED ORDER — METOPROLOL TARTRATE 75 MG PO TABS: 75.0000 mg | ORAL_TABLET | Freq: Two times a day (BID) | ORAL | 1 refills | Status: DC

## 2019-01-10 MED ORDER — PIOGLITAZONE HCL 30 MG PO TABS: 30.0000 mg | ORAL_TABLET | Freq: Every day | ORAL | 1 refills | Status: DC

## 2019-01-10 MED ORDER — DULAGLUTIDE 0.75 MG/0.5ML ~~LOC~~ SOAJ: 0.7500 mg | SUBCUTANEOUS | 2 refills | Status: DC

## 2019-01-10 MED ORDER — METFORMIN HCL 1000 MG PO TABS: 1000.0000 mg | ORAL_TABLET | Freq: Two times a day (BID) | ORAL | 1 refills | Status: DC

## 2019-01-10 MED ORDER — FUROSEMIDE 20 MG PO TABS: 20.0000 mg | ORAL_TABLET | Freq: Every day | ORAL | 1 refills | Status: DC

## 2019-01-10 MED ORDER — ROSUVASTATIN CALCIUM 5 MG PO TABS: 5.0000 mg | ORAL_TABLET | Freq: Every day | ORAL | 1 refills | Status: DC

## 2019-01-10 MED ORDER — DULOXETINE HCL 60 MG PO CPEP: 60.0000 mg | ORAL_CAPSULE | Freq: Every day | ORAL | 3 refills | Status: DC

## 2019-01-10 MED ORDER — MIRTAZAPINE 15 MG PO TABS: 15.0000 mg | ORAL_TABLET | Freq: Every day | ORAL | 2 refills | Status: DC

## 2019-01-10 MED ORDER — OMEPRAZOLE 20 MG PO CPDR: 20.0000 mg | DELAYED_RELEASE_CAPSULE | Freq: Two times a day (BID) | ORAL | 2 refills | Status: DC

## 2019-01-10 NOTE — Progress Notes (Signed)
Patient: Renee Bush Female    DOB: 04/01/1961   58 y.o.   MRN: [id number] Visit Date: 01/10/2019  Today's Provider: [facility]   No chief complaint on file.  Subjective:    HPI   Telephone visit   Pt states that she is having increased edema and neuorpathic pain in her legs and feet. She has not had her B12 shot recently. States she cannot sleep due to the pain.   Take medications as directed.    Allergies  Allergen Reactions  . Etodolac Rash  . Penicillin G Rash  . Penicillins Rash    Has patient had a PCN reaction causing immediate rash, facial/tongue/throat swelling, SOB or lightheadedness with hypotension: Yes Has patient had a PCN reaction causing severe rash involving mucus membranes or skin necrosis: No Has patient had a PCN reaction that required hospitalization: No Has patient had a PCN reaction occurring within the last 10 years: No If all of the above answers are "NO", then may proceed with Cephalosporin use.   Previous Medications   ASPIRIN EC 81 MG TABLET    Take 1 tablet (81 mg total) by mouth daily.   DULAGLUTIDE (TRULICITY) 0.75 MG/0.5ML SOPN    Inject 0.75 mg into the skin once a week.   FERROUS SULFATE 325 (65 FE) MG TABLET    TAKE ONE TABLET BY MOUTH EVERY DAY WITH BREAKFAST.   FUROSEMIDE (LASIX) 20 MG TABLET    Take 1 tablet (20 mg total) by mouth daily.   GABAPENTIN (NEURONTIN) 400 MG CAPSULE    Take 1 capsule (400 mg total) by mouth 3 (three) times daily. Take 2 caps at bedtime.   INSULIN GLARGINE (LANTUS) 100 UNIT/ML INJECTION    Inject 0.3 mLs (30 Units total) into the skin daily.   LISINOPRIL (PRINIVIL,ZESTRIL) 10 MG TABLET    Take 1 tablet (10 mg total) by mouth daily.   MELOXICAM (MOBIC) 7.5 MG TABLET    Take 1 tablet (7.5 mg total) by mouth daily.   METFORMIN (GLUCOPHAGE) 1000 MG TABLET    Take 1 tablet (1,000 mg total) by mouth 2 (two) times daily with a meal.   METOPROLOL TARTRATE 75 MG TABS    Take 75 mg by mouth 2 (two)  times daily.   MIRTAZAPINE (REMERON) 15 MG TABLET    Take 1 tablet (15 mg total) by mouth at bedtime for 30 days.   NITROGLYCERIN (NITROSTAT) 0.4 MG SL TABLET    Place 1 tablet (0.4 mg total) under the tongue every 5 (five) minutes as needed for chest pain.   OMEGA-3 FATTY ACIDS (FISH OIL) 1000 MG CAPS    Take 1,000 mg by mouth 2 (two) times daily.   OMEPRAZOLE (PRILOSEC) 20 MG CAPSULE    Take 1 capsule (20 mg total) by mouth 2 (two) times daily before a meal.   PIOGLITAZONE (ACTOS) 30 MG TABLET    Take 1 tablet (30 mg total) by mouth daily.   ROSUVASTATIN (CRESTOR) 5 MG TABLET    Take 1 tablet (5 mg total) by mouth daily.    Review of Systems  All other systems reviewed and are negative.   Social History   Tobacco Use  . Smoking status: Former Smoker    Types: Cigarettes    Last attempt to quit: 04/17/2005    Years since quitting: 13.7  . Smokeless tobacco: Never Used  Substance Use Topics  . Alcohol use: No    Alcohol/week: 14.0 standard drinks  Types: 14 Cans of beer per week    Comment: quit ~57mo ago   Objective:   There were no vitals taken for this visit.  Physical Exam  No PE.     Assessment & Plan:        Labs scheduled for next Thursday- will give B12 injection at that time. Will review and adjust accordingly.    DM:  Encourage diabetic diet and exercise.  Continue current medication regimen.  Worsening neuropathy- will start Duloxetine 60mg  daily. Hold Remeron while taking medication. Continue gabapentin.  FU in 1 month for effectiveness of medication.   HTN:  Goal BP <140/90.  Continue current medication regimen.  Encourage low salt diet and exercise.   HLD:   Continue current regimen.  Encourage low cholesterol, low fat diet and exercise.        [facility]   [facility] [place]

## 2019-01-10 NOTE — Telephone Encounter (Signed)
01/10/2019 8:57:04 AM - Trulicity refill  01/10/2019 Received faxed notice from [facility] requesting a new 4 month supply prescription for Trulicity-I had previously faxed to [employer] for refill-Printed script and taking to [facility] for Dr. Candelaria Stagers to sign-Trulicity 0.75/0.5 injection-Inject contents of one pen (0.75mg ) into the skin once a week=4 months supply.Forde Radon

## 2019-01-15 ENCOUNTER — Ambulatory Visit: Payer: Self-pay | Admitting: Licensed Clinical Social Worker

## 2019-01-15 ENCOUNTER — Other Ambulatory Visit: Payer: Self-pay

## 2019-01-15 DIAGNOSIS — F411 Generalized anxiety disorder: Secondary | ICD-10-CM

## 2019-01-15 DIAGNOSIS — F331 Major depressive disorder, recurrent, moderate: Secondary | ICD-10-CM

## 2019-01-15 NOTE — BH Specialist Note (Signed)
Integrated Behavioral Health Follow Up Visit  MRN: [id number] Name: Renee Bush  Number of Integrated Behavioral Health Clinician visits: 5/6  Type of Service: Integrated Behavioral Health- Individual/Family Interpretor:No. Interpretor Name and Language: Not applicable.  SUBJECTIVE: Renee Bush is a 58 y.o. female accompanied by herself. Patient was referred by self for mental health. Patient reports the following symptoms/concerns: She reports that she has been doing okay since the last session. She reports that in terms of the Cymbalta that she is able to get a good night's rest and is not experiencing the pain in her legs like she was before. She reports that her mood goes up and down due to arguments with her husband. She explains that she has been keeping busy with baby sitting her grandchildren and doing things around the house. She explains that she could use a hair cut. She denies suicidal and homicidal thoughts. Duration of problem: ; Severity of problem: mild  OBJECTIVE: Mood: Euthymic and Affect: Appropriate Risk of harm to self or others: No plan to harm self or others  LIFE CONTEXT: Family and Social: see above. School/Work: see above. Self-Care: see above. Life Changes: see above.  GOALS ADDRESSED: Patient will: 1.  Reduce symptoms of: stress  2.  Increase knowledge and/or ability of: coping skills and self-management skills  3.  Demonstrate ability to: Increase healthy adjustment to current life circumstances  INTERVENTIONS: Interventions utilized:  Brief CBT was utilized the clinician focusing on the patient's stress and affect on normal cognition. Clinician asked the patient how she has been doing with the change in her medications. Clinician asked the patient if her quality of sleep has been affected. Clinician asked the patient about the type of pain she was experiencing and her sleeping arrangements with her husband. Clinician explained to the  patient that it seems like her symptoms of anxiety and depression haven't increased. Clinician suggested that the patient continue with the Cymbalta and continue to hold the Mirtazapine. Clinician discussed with the patient the concept of stress management and setting healthy boundaries with her husband.  Standardized Assessments completed: GAD-7 and PHQ 9  ASSESSMENT: Patient currently experiencing see above.   Patient may benefit from see above.  PLAN: 1. Follow up with behavioral health clinician on : three to four weeks or earlier if needed. 2. Behavioral recommendations: see above. 3. Referral(s): Integrated [employer] (In Clinic) 4. "From scale of 1-10, how likely are you to follow plan?":   Althia Forts, LCSW

## 2019-01-17 ENCOUNTER — Other Ambulatory Visit: Payer: Self-pay

## 2019-01-17 DIAGNOSIS — E1140 Type 2 diabetes mellitus with diabetic neuropathy, unspecified: Secondary | ICD-10-CM

## 2019-01-17 DIAGNOSIS — E7800 Pure hypercholesterolemia, unspecified: Secondary | ICD-10-CM

## 2019-01-17 MED ORDER — CYANOCOBALAMIN 1000 MCG/ML IJ SOLN
1000.0000 ug | Freq: Once | INTRAMUSCULAR | Status: AC
  Administered 2019-01-17: 1000 ug via INTRAMUSCULAR

## 2019-01-18 ENCOUNTER — Telehealth: Payer: Self-pay | Admitting: Pharmacist

## 2019-01-18 LAB — LIPID PANEL
Chol/HDL Ratio: 3.1 ratio (ref 0.0–4.4)
Cholesterol, Total: 133 mg/dL (ref 100–199)
HDL: 43 mg/dL (ref >39)
LDL Calculated: 68 mg/dL (ref 0–99)
Triglycerides: 112 mg/dL (ref 0–149)
VLDL Cholesterol Cal: 22 mg/dL (ref 5–40)

## 2019-01-18 LAB — BASIC METABOLIC PANEL
BUN/Creatinine Ratio: 16 (ref 9–23)
BUN: 25 mg/dL — ABNORMAL HIGH (ref 6–24)
CO2: 26 mmol/L (ref 20–29)
Calcium: 9.3 mg/dL (ref 8.7–10.2)
Chloride: 99 mmol/L (ref 96–106)
Creatinine, Ser: 1.56 mg/dL — ABNORMAL HIGH (ref 0.57–1.00)
GFR calc Af Amer: 42 mL/min/{1.73_m2} — ABNORMAL LOW (ref >59)
GFR calc non Af Amer: 36 mL/min/{1.73_m2} — ABNORMAL LOW (ref >59)
Glucose: 106 mg/dL — ABNORMAL HIGH (ref 65–99)
Potassium: 4.7 mmol/L (ref 3.5–5.2)
Sodium: 140 mmol/L (ref 134–144)

## 2019-01-18 LAB — HEMOGLOBIN A1C
Est. average glucose Bld gHb Est-mCnc: 140 mg/dL
Hgb A1c MFr Bld: 6.5 % — ABNORMAL HIGH (ref 4.8–5.6)

## 2019-01-18 NOTE — Telephone Encounter (Signed)
01/18/2019 9:43:22 AM - Trulicity refill to [facility]  01/18/2019 Faxed refill request back to [facility] for Trulicity Inject 0.75mg  into the skin once a week.Forde Radon

## 2019-01-24 ENCOUNTER — Telehealth: Payer: Self-pay | Admitting: Pharmacist

## 2019-01-24 NOTE — Telephone Encounter (Signed)
01/24/2019 10:40:20 AM - Lantus Solostar refill  01/24/2019 Printed [employer] refill request for Lantus Solostar Inject 30 units daily at bedtime # 2, taking to [facility] for provider to sign.Forde Radon

## 2019-02-07 ENCOUNTER — Ambulatory Visit: Payer: Self-pay | Admitting: Licensed Clinical Social Worker

## 2019-02-07 ENCOUNTER — Ambulatory Visit: Payer: Self-pay | Admitting: Adult Health Nurse Practitioner

## 2019-02-07 ENCOUNTER — Other Ambulatory Visit: Payer: Self-pay

## 2019-02-07 DIAGNOSIS — F411 Generalized anxiety disorder: Secondary | ICD-10-CM

## 2019-02-07 DIAGNOSIS — Z794 Long term (current) use of insulin: Secondary | ICD-10-CM

## 2019-02-07 DIAGNOSIS — I152 Hypertension secondary to endocrine disorders: Secondary | ICD-10-CM

## 2019-02-07 DIAGNOSIS — E1159 Type 2 diabetes mellitus with other circulatory complications: Secondary | ICD-10-CM

## 2019-02-07 DIAGNOSIS — E1140 Type 2 diabetes mellitus with diabetic neuropathy, unspecified: Secondary | ICD-10-CM

## 2019-02-07 DIAGNOSIS — F331 Major depressive disorder, recurrent, moderate: Secondary | ICD-10-CM

## 2019-02-07 MED ORDER — TRULICITY 0.75 MG/0.5ML ~~LOC~~ SOAJ: 0.7500 mg | SUBCUTANEOUS | 4 refills | Status: DC

## 2019-02-07 MED ORDER — FUROSEMIDE 20 MG PO TABS: 20.0000 mg | ORAL_TABLET | Freq: Every day | ORAL | 1 refills | Status: DC

## 2019-02-07 NOTE — Progress Notes (Signed)
Patient: Renee Bush Female    DOB: 1961/08/08   58 y.o.   MRN: [id number] Visit Date: 02/07/2019  Today's Provider: [facility]   No chief complaint on file.  Subjective:    HPI   Telephonic visit.    Last visit with worsening neuropathy- started on Duloxetine 60mg  daily. To hold Remeron while taking medication. Continue gabapentin.  Pt states she is doing well on Cymbalta- tolerating well. States her pain is improved.   Allergies  Allergen Reactions  . Etodolac Rash  . Penicillin G Rash  . Penicillins Rash    Has patient had a PCN reaction causing immediate rash, facial/tongue/throat swelling, SOB or lightheadedness with hypotension: Yes Has patient had a PCN reaction causing severe rash involving mucus membranes or skin necrosis: No Has patient had a PCN reaction that required hospitalization: No Has patient had a PCN reaction occurring within the last 10 years: No If all of the above answers are "NO", then may proceed with Cephalosporin use.   Previous Medications   ASPIRIN EC 81 MG TABLET    Take 1 tablet (81 mg total) by mouth daily.   DULAGLUTIDE (TRULICITY) 0.75 MG/0.5ML SOPN    Inject 0.75 mg into the skin once a week.   DULOXETINE (CYMBALTA) 60 MG CAPSULE    Take 1 capsule (60 mg total) by mouth daily.   FERROUS SULFATE 325 (65 FE) MG TABLET    TAKE ONE TABLET BY MOUTH EVERY DAY WITH BREAKFAST.   FUROSEMIDE (LASIX) 20 MG TABLET    Take 1 tablet (20 mg total) by mouth daily.   GABAPENTIN (NEURONTIN) 400 MG CAPSULE    Take 1 capsule (400 mg total) by mouth 3 (three) times daily. Take 2 caps at bedtime.   INSULIN GLARGINE (LANTUS) 100 UNIT/ML INJECTION    Inject 0.3 mLs (30 Units total) into the skin daily.   LISINOPRIL (ZESTRIL) 10 MG TABLET    Take 1 tablet (10 mg total) by mouth daily.   METFORMIN (GLUCOPHAGE) 1000 MG TABLET    Take 1 tablet (1,000 mg total) by mouth 2 (two) times daily with a meal.   METOPROLOL TARTRATE 75 MG TABS    Take 75 mg by  mouth 2 (two) times daily.   MIRTAZAPINE (REMERON) 15 MG TABLET    Take 1 tablet (15 mg total) by mouth at bedtime for 30 days.   NITROGLYCERIN (NITROSTAT) 0.4 MG SL TABLET    Place 1 tablet (0.4 mg total) under the tongue every 5 (five) minutes as needed for chest pain.   OMEGA-3 FATTY ACIDS (FISH OIL) 1000 MG CAPS    Take 1,000 mg by mouth 2 (two) times daily.   OMEPRAZOLE (PRILOSEC) 20 MG CAPSULE    Take 1 capsule (20 mg total) by mouth 2 (two) times daily before a meal.   PIOGLITAZONE (ACTOS) 30 MG TABLET    Take 1 tablet (30 mg total) by mouth daily.   ROSUVASTATIN (CRESTOR) 5 MG TABLET    Take 1 tablet (5 mg total) by mouth daily.    Review of Systems  All other systems reviewed and are negative.   Social History   Tobacco Use  . Smoking status: Former Smoker    Types: Cigarettes    Quit date: 04/17/2005    Years since quitting: 13.8  . Smokeless tobacco: Never Used  Substance Use Topics  . Alcohol use: No    Alcohol/week: 14.0 standard drinks    Types: 14 Cans of beer per  week    Comment: quit ~36mo ago   Objective:   There were no vitals taken for this visit.  Physical Exam  No PE.     Assessment & Plan:     Neuropathy/Anxiety:  D/c remeron.  Continue Duloxetine 60mg  daily.  Monitor for recurrent symptoms.  Manage stressors/anxiety.  Continue to FU with Heather.       FU in 2-3 months for routine care.    [facility] [place]

## 2019-02-07 NOTE — BH Specialist Note (Signed)
Integrated Behavioral Health Follow Up Visit Via Phone  MRN: [id number] Name: Renee Bush  Number of [place] Clinician visits: Not applicable. Session Start time: 5:00 pm  Session End time:  Total time: 40 minutes  Type of Service: [place] Interpretor:No. Interpretor Name and Language: Not applicable.  SUBJECTIVE: Renee Bush is a 58 y.o. female accompanied by herself. Patient was referred by self for mental health. Patient reports the following symptoms/concerns: She reports that she has been doing well on the Cymbalta. She explains that the Cymbalta has mellowed her out and is no longer experiencing pain in her legs. She denies feeling down and depressed. She describes experiencing some anxiety because her husband is having some health problems. She reports that she has been keeping busy with baby sitting her grandchildren and spending time with her family. She denies suicidal and homicidal thoughts.  Duration of problem: ; Severity of problem: mild  OBJECTIVE: Mood: Euthymic and Affect: Appropriate Risk of harm to self or others: No plan to harm self or others  LIFE CONTEXT: Family and Social: See above. School/Work:  Self-Care: See above. Life Changes: See above.  GOALS ADDRESSED: Patient will: 1.  Reduce symptoms of: anxiety  2.  Increase knowledge and/or ability of: stress reduction  3.  Demonstrate ability to: Increase healthy adjustment to current life circumstances  INTERVENTIONS: Interventions utilized:  Brief CBT was utilized by the clinician focusing on anxiety and affect on normal cognition. Clinician processed with the patient regarding how she has been doing since the last follow up session. Clinician discussed with the patient the factors contributing to her anxiety. Clinician encouraged the patient to focus on the positive, practice self care, and try not to worry about things that are out  of her control. Standardized Assessments completed: GAD-7 and PHQ 9  ASSESSMENT: Patient currently experiencing see above .   Patient may benefit from see above.  PLAN: 1. Follow up with behavioral health clinician on : one month or earlier if needed. 2. Behavioral recommendations: see above. 3. Referral(s): [place] (In Clinic) 4. "From scale of 1-10, how likely are you to follow plan?": [place], LCSW

## 2019-02-08 ENCOUNTER — Other Ambulatory Visit: Payer: Self-pay | Admitting: Adult Health Nurse Practitioner

## 2019-02-08 DIAGNOSIS — Z794 Long term (current) use of insulin: Secondary | ICD-10-CM

## 2019-02-08 DIAGNOSIS — E1140 Type 2 diabetes mellitus with diabetic neuropathy, unspecified: Secondary | ICD-10-CM

## 2019-02-11 ENCOUNTER — Other Ambulatory Visit: Payer: Self-pay | Admitting: Adult Health Nurse Practitioner

## 2019-02-11 DIAGNOSIS — E1159 Type 2 diabetes mellitus with other circulatory complications: Secondary | ICD-10-CM

## 2019-02-14 ENCOUNTER — Other Ambulatory Visit: Payer: Self-pay

## 2019-02-14 ENCOUNTER — Ambulatory Visit: Payer: Self-pay | Admitting: Urology

## 2019-02-14 DIAGNOSIS — E538 Deficiency of other specified B group vitamins: Secondary | ICD-10-CM

## 2019-02-14 DIAGNOSIS — R5383 Other fatigue: Secondary | ICD-10-CM

## 2019-02-14 MED ORDER — CYANOCOBALAMIN 1000 MCG/ML IJ SOLN
1000.0000 ug | Freq: Once | INTRAMUSCULAR | Status: AC
  Administered 2019-02-14: 1000 ug via INTRAMUSCULAR

## 2019-02-22 ENCOUNTER — Telehealth: Payer: Self-pay | Admitting: Pharmacist

## 2019-02-22 NOTE — Telephone Encounter (Signed)
02/22/2019 9:22:49 AM - Lantus Solostar refill  02/22/2019 Faxed [employer] refill request for Lantus Solostar Inject 30 units daily at bedtime #2.Renee Bush

## 2019-03-07 ENCOUNTER — Other Ambulatory Visit: Payer: Self-pay

## 2019-03-07 ENCOUNTER — Ambulatory Visit: Payer: Self-pay | Admitting: Licensed Clinical Social Worker

## 2019-03-07 DIAGNOSIS — F331 Major depressive disorder, recurrent, moderate: Secondary | ICD-10-CM

## 2019-03-07 DIAGNOSIS — F411 Generalized anxiety disorder: Secondary | ICD-10-CM

## 2019-03-07 NOTE — BH Specialist Note (Signed)
Integrated Behavioral Health Follow Up Visit Via Phone  MRN: [id number] Name: Renee Bush  Type of Service: [place] Interpretor:No. Interpretor Name and Language: Not applicable.  SUBJECTIVE: Renee Bush is a 58 y.o. female accompanied by herself. Patient was referred by self for mental health. Patient reports the following symptoms/concerns: She reports that she has been doing well since the last follow up session. She explained that she has had some anxiety about her husband's overall health and awaiting test results to come back. She explains that the medication has helped her to manage her symptoms of anxiety, depression, and no longer has the pain in her legs so she is able to sleep at night. She notes that she looks forward to sitting outside on her front porch in the evenings to spend time with her husband, neighbors, son, and his girlfriend. She reports that she plans to start walking with her husband early in the morning or later on in the evening. She explains that her son in law is in between jobs so she is not having to baby sit her grandchildren as often.She denies suicidal and homicidal thoughts.  Duration of problem: ; Severity of problem: mild  OBJECTIVE: Mood: Euthymic and Affect: Appropriate Risk of harm to self or others: No plan to harm self or others  LIFE CONTEXT: Family and Social: see above. School/Work: see above. Self-Care: see above. Life Changes:see above.  GOALS ADDRESSED: Patient will: 1.  Reduce symptoms of: anxiety  2.  Increase knowledge and/or ability of: coping skills  3.  Demonstrate ability to: Increase healthy adjustment to current life circumstances  INTERVENTIONS: Interventions utilized:  Brief CBT was utilized by the clinician focusing on her anxiety and affect on normal cognition. Clinician processed with the patient regarding how she has been doing since the last follow up session.  Clinician discussed with the patient regarding the source of her anxiety. Clinician explained to the patient that it seems like despite worrying about her husband's test results and overall health that has been able to manage her symptoms of anxiety. Clinician encouraged the patient to continue to utilize her coping skills and things for enjoyment.  Standardized Assessments completed: GAD-7 and PHQ 9  ASSESSMENT: Patient currently experiencing see above.   Patient may benefit from see above.  PLAN: 1. Follow up with behavioral health clinician on : one month or earlier if needed. 2. Behavioral recommendations: see above. 3. Referral(s): [place] (In Clinic) 4. "From scale of 1-10, how likely are you to follow plan?":   Bayard Hugger, LCSW

## 2019-03-19 ENCOUNTER — Telehealth: Payer: Self-pay | Admitting: Pharmacy Technician

## 2019-03-19 NOTE — Telephone Encounter (Signed)
Patient failed to provide 2020 poi.  No additional medication assistance will be provided by [facility] without the required proof of income documentation.  Patient notified by letter.  Aerika Groll J. Shiro Ellerman Care Manager [facility] 

## 2019-03-20 ENCOUNTER — Telehealth: Payer: Self-pay | Admitting: Pharmacist

## 2019-03-20 NOTE — Telephone Encounter (Signed)
03/20/2019 I have received notice from [facility] time for patient to renew for Trulicity. Patient has not returned her Recert packet and financials. I see that patient has appt at [facility] 03/21/2019 @ 6:45pm for injection--I have emailed [facility], I will take over a Recert packet to be given to patient & requested they explain needs to return so we can order Trulicity.[name]

## 2019-03-21 ENCOUNTER — Ambulatory Visit: Payer: Self-pay

## 2019-03-21 ENCOUNTER — Other Ambulatory Visit: Payer: Self-pay

## 2019-03-21 DIAGNOSIS — E538 Deficiency of other specified B group vitamins: Secondary | ICD-10-CM

## 2019-03-21 MED ORDER — CYANOCOBALAMIN 1000 MCG/ML IJ SOLN: 1000.0000 ug | Freq: Once | INTRAMUSCULAR | Status: DC

## 2019-03-21 NOTE — Progress Notes (Signed)
Pt received b12 injection in the rt deltoid. Pt tolerated well.

## 2019-04-04 ENCOUNTER — Ambulatory Visit: Payer: Self-pay | Admitting: Licensed Clinical Social Worker

## 2019-04-04 ENCOUNTER — Telehealth: Payer: Self-pay | Admitting: Licensed Clinical Social Worker

## 2019-04-04 NOTE — Telephone Encounter (Signed)
Clinician attempted to call the patient twice during their scheduled phone visit and left voicemail with call back information about rescheduling her missed appointment.

## 2019-04-12 ENCOUNTER — Other Ambulatory Visit: Payer: Self-pay | Admitting: Adult Health Nurse Practitioner

## 2019-04-12 DIAGNOSIS — I10 Essential (primary) hypertension: Secondary | ICD-10-CM

## 2019-04-12 DIAGNOSIS — E1140 Type 2 diabetes mellitus with diabetic neuropathy, unspecified: Secondary | ICD-10-CM

## 2019-04-12 DIAGNOSIS — E7800 Pure hypercholesterolemia, unspecified: Secondary | ICD-10-CM

## 2019-04-17 ENCOUNTER — Other Ambulatory Visit: Payer: Self-pay | Admitting: Adult Health Nurse Practitioner

## 2019-04-17 DIAGNOSIS — E7800 Pure hypercholesterolemia, unspecified: Secondary | ICD-10-CM

## 2019-04-18 ENCOUNTER — Ambulatory Visit: Payer: Self-pay | Admitting: Licensed Clinical Social Worker

## 2019-04-23 ENCOUNTER — Other Ambulatory Visit: Payer: Self-pay

## 2019-04-23 ENCOUNTER — Ambulatory Visit: Payer: Self-pay | Admitting: Licensed Clinical Social Worker

## 2019-04-23 DIAGNOSIS — F331 Major depressive disorder, recurrent, moderate: Secondary | ICD-10-CM

## 2019-04-23 DIAGNOSIS — F411 Generalized anxiety disorder: Secondary | ICD-10-CM

## 2019-04-23 NOTE — BH Specialist Note (Signed)
Integrated Behavioral Health Follow Up Visit Via Phone  MRN: [id number] Name: Renee Bush  Type of Service: [place] Interpretor:No. Interpretor Name and Language: Not applicable.   SUBJECTIVE: Renee Bush is a 58 y.o. female accompanied by herself. Patient was referred by self for mental health. Patient reports the following symptoms/concerns: She reports that she has a new granddaughter. She notes that she hasn't been able to see her other grandchildren due to them being sick and having to be tested for COVID 19. She notes that she has fourteen grandchildren and four great grandchildren. She notes that she has been doing some walking and mowing the yard. She denies feeling down and depressed. She denies worrying about things. She notes that she has been occupying her time by watching TV and playing games on her phone. She denies suicidal and homicidal thoughts.  Duration of problem: ; Severity of problem: mild  OBJECTIVE: Mood: Euthymic and Affect: Appropriate Risk of harm to self or others: No plan to harm self or others  LIFE CONTEXT: Family and Social: See above. School/Work: See above. Self-Care: See above. Life Changes: See above.   GOALS ADDRESSED: Patient will: 1.  Reduce symptoms of: relapse prevention  2.  Increase knowledge and/or ability of: self-management skills  3.  Demonstrate ability to: Increase healthy adjustment to current life circumstances  INTERVENTIONS: Interventions utilized:  Brief CBT was utilized by the clinician discussing the relapse prevention. Clinician processed with the patient regarding how she has been doing since the last follow up session. Clinician congratulated the patient on the birth of her new grandchild. Clinician administered a PHQ 9 and GAD 7 to screen for both depression and anxiety. Clinician asked the patient if she has been continuing to utilize her coping skills and practicing self  care. Clinician encouraged the patient to continue to take her psychotropic medication and rely on her family for support.  Standardized Assessments completed: GAD-7 and PHQ 9  ASSESSMENT: Patient currently experiencing see above.   Patient may benefit from see above.  PLAN: 1. Follow up with behavioral health clinician on : one month or earlier if needed. 2. Behavioral recommendations: see above. 3. Referral(s): [place] (In Clinic) 4. "From scale of 1-10, how likely are you to follow plan?":   Bayard Hugger, LCSW

## 2019-04-24 ENCOUNTER — Telehealth: Payer: Self-pay | Admitting: Pharmacy Technician

## 2019-04-24 ENCOUNTER — Other Ambulatory Visit: Payer: Self-pay

## 2019-04-24 DIAGNOSIS — E1140 Type 2 diabetes mellitus with diabetic neuropathy, unspecified: Secondary | ICD-10-CM

## 2019-04-24 NOTE — Telephone Encounter (Signed)
Received 2020 proof of income.  Patient eligible to receive medication assistance at [facility] as long as eligibility requirements continue to be met.  [facility]

## 2019-04-25 ENCOUNTER — Other Ambulatory Visit: Payer: Self-pay

## 2019-04-25 LAB — CBC
Hematocrit: 36.8 % (ref 34.0–46.6)
Hemoglobin: 12.2 g/dL (ref 11.1–15.9)
MCH: 29.9 pg (ref 26.6–33.0)
MCHC: 33.2 g/dL (ref 31.5–35.7)
MCV: 90 fL (ref 79–97)
Platelets: 224 10*3/uL (ref 150–450)
RBC: 4.08 x10E6/uL (ref 3.77–5.28)
RDW: 14.3 % (ref 11.7–15.4)
WBC: 4.9 10*3/uL (ref 3.4–10.8)

## 2019-04-25 LAB — COMPREHENSIVE METABOLIC PANEL
ALT: 10 IU/L (ref 0–32)
AST: 9 IU/L (ref 0–40)
Albumin/Globulin Ratio: 2.2 (ref 1.2–2.2)
Albumin: 4.3 g/dL (ref 3.8–4.9)
Alkaline Phosphatase: 52 IU/L (ref 39–117)
BUN/Creatinine Ratio: 20 (ref 9–23)
BUN: 27 mg/dL — ABNORMAL HIGH (ref 6–24)
Bilirubin Total: 0.3 mg/dL (ref 0.0–1.2)
CO2: 24 mmol/L (ref 20–29)
Calcium: 9.1 mg/dL (ref 8.7–10.2)
Chloride: 101 mmol/L (ref 96–106)
Creatinine, Ser: 1.35 mg/dL — ABNORMAL HIGH (ref 0.57–1.00)
GFR calc Af Amer: 50 mL/min/{1.73_m2} — ABNORMAL LOW (ref >59)
GFR calc non Af Amer: 43 mL/min/{1.73_m2} — ABNORMAL LOW (ref >59)
Globulin, Total: 2 g/dL (ref 1.5–4.5)
Glucose: 104 mg/dL — ABNORMAL HIGH (ref 65–99)
Potassium: 4.3 mmol/L (ref 3.5–5.2)
Sodium: 140 mmol/L (ref 134–144)
Total Protein: 6.3 g/dL (ref 6.0–8.5)

## 2019-04-25 LAB — HEMOGLOBIN A1C
Est. average glucose Bld gHb Est-mCnc: 128 mg/dL
Hgb A1c MFr Bld: 6.1 % — ABNORMAL HIGH (ref 4.8–5.6)

## 2019-05-02 ENCOUNTER — Other Ambulatory Visit: Payer: Self-pay

## 2019-05-02 ENCOUNTER — Ambulatory Visit: Payer: Self-pay | Admitting: Gerontology

## 2019-05-02 VITALS — BP 111/71 | HR 86 | Temp 97.0°F | Ht 62.0 in | Wt 196.4 lb

## 2019-05-02 DIAGNOSIS — E1140 Type 2 diabetes mellitus with diabetic neuropathy, unspecified: Secondary | ICD-10-CM

## 2019-05-02 DIAGNOSIS — Z0000 Encounter for general adult medical examination without abnormal findings: Secondary | ICD-10-CM

## 2019-05-02 DIAGNOSIS — E1159 Type 2 diabetes mellitus with other circulatory complications: Secondary | ICD-10-CM

## 2019-05-02 DIAGNOSIS — E7800 Pure hypercholesterolemia, unspecified: Secondary | ICD-10-CM

## 2019-05-02 DIAGNOSIS — Z794 Long term (current) use of insulin: Secondary | ICD-10-CM

## 2019-05-02 DIAGNOSIS — K219 Gastro-esophageal reflux disease without esophagitis: Secondary | ICD-10-CM

## 2019-05-02 DIAGNOSIS — N183 Chronic kidney disease, stage 3 unspecified: Secondary | ICD-10-CM

## 2019-05-02 MED ORDER — INSULIN GLARGINE 100 UNIT/ML ~~LOC~~ SOLN: 18.0000 [IU] | Freq: Every day | SUBCUTANEOUS | 11 refills | Status: DC

## 2019-05-02 MED ORDER — FUROSEMIDE 20 MG PO TABS: 20.0000 mg | ORAL_TABLET | Freq: Every day | ORAL | 0 refills | Status: DC

## 2019-05-02 MED ORDER — OMEPRAZOLE 20 MG PO CPDR: DELAYED_RELEASE_CAPSULE | ORAL | 1 refills | Status: DC

## 2019-05-02 MED ORDER — METFORMIN HCL 1000 MG PO TABS: 500.0000 mg | ORAL_TABLET | Freq: Two times a day (BID) | ORAL | 1 refills | Status: DC

## 2019-05-02 NOTE — Progress Notes (Signed)
Established Patient Office Visit  Subjective:  Patient ID: Renee Bush, female    DOB: 05-Oct-1960  Age: 58 y.o. MRN: [id number]  CC:  Chief Complaint  Patient presents with  . Diabetes  . Hypertension  Patient consents to telephone visit and 2 patient identifiers was used to identify patient.  HPI Renee Bush presents for follow up of type 2 diabetes mellitus, hypertension, and lab review. HgbA1C done 8 days ago was 6.1 %. She is compliant with her medications, adheres to diabetic diet and exercises as tolerated. She continues to take 30 mg Actos daily, 1000 mg Metformin bid, 0.'75mg'$  Trulicity weekly and 30 units of Lantus daily. She reports that she has not checked her blood glucose for more than 5 weeks, denies hypoglycemic symptoms, and peripheral neuropathy has improved 90% with taking gabapentin and Cymbalta. She doesn't check her blood pressure at home, but continues on low sodium diet. She states that she has not had Mammogram, pap smear or colonoscopy done. She reports taking 20 mg Furosemide every other day for edema to ankles, but she denies bilateral lower leg edema. Her serum creatinine done 8 days ago decreased from 1.56 to 1.35 and eGFR for non Af Amer increased from 36- 43, and eGFR for Af Amer increased from 42 to 50. She denies chest pain, palpitation, light headedness, cough, myalgia, fever, chills, states that she's doing well and offers no further concerns.  Past Medical History:  Diagnosis Date  . Chronic kidney disease, stage III (moderate) ([place]) 10/06/2017  . Diabetes mellitus without complication ([place])   . GERD (gastroesophageal reflux disease)   . Hypercholesteremia   . Hypertension 09/10/2015    Past Surgical History:  Procedure Laterality Date  . APPENDECTOMY    . CESAREAN SECTION  1991    Family History  Problem Relation Age of Onset  . COPD Mother   . Alzheimer's disease Mother   . Hypertension Father   . Hyperlipidemia Father   . Heart  attack Father   . Diabetes type II Father   . Diabetes type II Sister   . Diabetes type II Sister   . Gestational diabetes Daughter   . Diabetes type II Sister     Social History   Socioeconomic History  . Marital status: Married    Spouse name: Not on file  . Number of children: 4  . Years of education: Not on file  . Highest education level: 11th grade  Occupational History  . Occupation: na  Social Needs  . Financial resource strain: Hard  . Food insecurity    Worry: Sometimes true    Inability: Sometimes true  . Transportation needs    Medical: No    Non-medical: No  Tobacco Use  . Smoking status: Former Smoker    Types: Cigarettes    Quit date: 04/17/2005    Years since quitting: 14.0  . Smokeless tobacco: Never Used  Substance and Sexual Activity  . Alcohol use: No    Alcohol/week: 14.0 standard drinks    Types: 14 Cans of beer per week    Comment: quit ~64moago  . Drug use: No  . Sexual activity: Not on file  Lifestyle  . Physical activity    Days per week: 6 days    Minutes per session: 60 min  . Stress: To some extent  Relationships  . Social connections    Talks on phone: More than three times a week    Gets together: More than  three times a week    Attends religious service: Never    Active member of club or organization: No    Attends meetings of clubs or organizations: Never    Relationship status: Married  . Intimate partner violence    Fear of current or ex partner: No    Emotionally abused: No    Physically abused: No    Forced sexual activity: No  Other Topics Concern  . Not on file  Social History Narrative   Lives in mobile home paid for    Outpatient Medications Prior to Visit  Medication Sig Dispense Refill  . aspirin EC 81 MG tablet Take 1 tablet (81 mg total) by mouth daily.    . Dulaglutide (TRULICITY) 2.77 OE/4.2PN SOPN Inject 0.75 mg into the skin once a week. 4 pen 4  . DULoxetine (CYMBALTA) 60 MG capsule Take 1 capsule (60  mg total) by mouth daily. 30 capsule 3  . ferrous sulfate 325 (65 FE) MG tablet TAKE ONE TABLET BY MOUTH EVERY DAY WITH BREAKFAST. 60 tablet 0  . gabapentin (NEURONTIN) 400 MG capsule TAKE ONE CAPSULE BY MOUTH 2 TIMES A DAY AND 2 CAPSULES AT BEDTIME 120 capsule 0  . lisinopril (ZESTRIL) 10 MG tablet Take 1 tablet (10 mg total) by mouth daily. 90 tablet 1  . Metoprolol Tartrate 75 MG TABS Take 75 mg by mouth 2 (two) times daily. 180 tablet 1  . nitroGLYCERIN (NITROSTAT) 0.4 MG SL tablet 1 TABLET UNDER TONGUE AS NEEDED FOR CHEST PAIN EVERY 5 MINUTES FOR MAX OF 3 DOSES IN 15 MINUTES; IF NO RELIEF AFTER 1ST DOSE CALL 911 25 tablet 0  . Omega-3 Fatty Acids (FISH OIL) 1000 MG CAPS Take 1,000 mg by mouth 2 (two) times daily.    . pioglitazone (ACTOS) 30 MG tablet Take 1 tablet (30 mg total) by mouth daily. 90 tablet 1  . rosuvastatin (CRESTOR) 5 MG tablet Take 1 tablet (5 mg total) by mouth daily. 90 tablet 1  . furosemide (LASIX) 20 MG tablet TAKE ONE TABLET BY MOUTH EVERY DAY 90 tablet 0  . insulin glargine (LANTUS) 100 UNIT/ML injection Inject 0.3 mLs (30 Units total) into the skin daily. 10 mL 11  . metFORMIN (GLUCOPHAGE) 1000 MG tablet Take 1 tablet (1,000 mg total) by mouth 2 (two) times daily with a meal. 180 tablet 1  . omeprazole (PRILOSEC) 20 MG capsule TAKE ONE CAPSULE BY MOUTH 2 TIMES ADAY BEFORE A MEAL 120 capsule 0  . mirtazapine (REMERON) 15 MG tablet Take 1 tablet (15 mg total) by mouth at bedtime for 30 days. 30 tablet 2   Facility-Administered Medications Prior to Visit  Medication Dose Route Frequency Provider Last Rate Last Dose  . cyanocobalamin ((VITAMIN B-12)) injection 1,000 mcg  1,000 mcg Intramuscular Once Doles-Johnson, Teah, NP        Allergies  Allergen Reactions  . Etodolac Rash  . Penicillin G Rash  . Penicillins Rash    Has patient had a PCN reaction causing immediate rash, facial/tongue/throat swelling, SOB or lightheadedness with hypotension: Yes Has patient had a  PCN reaction causing severe rash involving mucus membranes or skin necrosis: No Has patient had a PCN reaction that required hospitalization: No Has patient had a PCN reaction occurring within the last 10 years: No If all of the above answers are "NO", then may proceed with Cephalosporin use.    ROS Review of Systems  Constitutional: Negative.   Respiratory: Negative.   Cardiovascular: Negative.  Gastrointestinal: Negative.   Endocrine: Negative.   Genitourinary: Negative.   Neurological: Negative.   Psychiatric/Behavioral: Negative.       Objective:    Physical Exam No vital sign or PE was done BP 111/71 Comment: taken 04/24/2019  Pulse 86   Temp (!) 97 F (36.1 C)   Ht '5\' 2"'$  (1.575 m)   Wt 196 lb 6.4 oz (89.1 kg)   SpO2 98%   BMI 35.92 kg/m  Wt Readings from Last 3 Encounters:  05/02/19 196 lb 6.4 oz (89.1 kg)  10/11/18 203 lb 8 oz (92.3 kg)  08/02/18 202 lb 14.4 oz (92 kg)   She was encouraged to continue her weight loss regimen.  Health Maintenance Due  Topic Date Due  . Hepatitis C Screening  1960-11-14  . PNEUMOCOCCAL POLYSACCHARIDE VACCINE AGE 51-64 HIGH RISK  07/22/1962  . FOOT EXAM  07/22/1970  . HIV Screening  07/23/1975  . PAP SMEAR-Modifier  07/22/1981  . MAMMOGRAM  07/22/2010  . COLONOSCOPY  07/22/2010  . INFLUENZA VACCINE  03/30/2019    There are no preventive care reminders to display for this patient.  Lab Results  Component Value Date   TSH 0.763 11/09/2017   Lab Results  Component Value Date   WBC 4.9 04/24/2019   HGB 12.2 04/24/2019   HCT 36.8 04/24/2019   MCV 90 04/24/2019   PLT 224 04/24/2019   Lab Results  Component Value Date   NA 140 04/24/2019   K 4.3 04/24/2019   CO2 24 04/24/2019   GLUCOSE 104 (H) 04/24/2019   BUN 27 (H) 04/24/2019   CREATININE 1.35 (H) 04/24/2019   BILITOT 0.3 04/24/2019   ALKPHOS 52 04/24/2019   AST 9 04/24/2019   ALT 10 04/24/2019   PROT 6.3 04/24/2019   ALBUMIN 4.3 04/24/2019   CALCIUM 9.1  04/24/2019   ANIONGAP 8 12/02/2017   Lab Results  Component Value Date   CHOL 133 01/17/2019   Lab Results  Component Value Date   HDL 43 01/17/2019   Lab Results  Component Value Date   LDLCALC 68 01/17/2019   Lab Results  Component Value Date   TRIG 112 01/17/2019   Lab Results  Component Value Date   CHOLHDL 3.1 01/17/2019   Lab Results  Component Value Date   HGBA1C 6.1 (H) 04/24/2019      Assessment & Plan:     1. Hypercholesteremia - She will continue on current treatment regimen. --Low fat Diet, like low fat dairy products eg skimmed milk -Avoid any fried food -Regular exercise/walk -Goal for Total Cholesterol is less than 200 -Goal for bad cholesterol LDL is less than 70 -Goal for Good cholesterol HDL is more than 45 -Goal for Triglyceride is less than 150    2. Type 2 diabetes mellitus with diabetic neuropathy, with long-term current use of insulin ([facility]) - Her HgbA1c has greatly improved to 6.1%. Due to her renal function, her Metformin was decreased to 500 mg  Bid, and Her Lantus was decreased to 18 units at bedtime. She was advised to check and record fasting and pre dinner blood glucose and bring log to office visit. -Use Diabetic diet as advised  -Take medications regularly as advised -Regular exercise - metFORMIN (GLUCOPHAGE) 1000 MG tablet; Take 0.5 tablets (500 mg total) by mouth 2 (two) times daily with a meal.  Dispense: 60 tablet; Refill: 1 - insulin glargine (LANTUS) 100 UNIT/ML injection; Inject 0.18 mLs (18 Units total) into the skin daily.  Dispense:  10 mL; Refill: 11  3. Hypertension associated with diabetes ([place]) - She will continue on current treatment regimen, and was advised to continue furosemide 20 mg every other day, and was advised to increase fluid intake. Will check lower leg edema during office visit. Will recheck renal function in few weeks. She was advised to elevate legs while sitting down and continue on low sodium diet. -  furosemide (LASIX) 20 MG tablet; Take 1 tablet (20 mg total) by mouth daily. Patient states that she takes every other day  Dispense: 90 tablet; Refill: 0  4. Chronic kidney disease, stage III (moderate) ([facility]) - Renal function will recheck and will consider Nephrology referral.  5. Gastroesophageal reflux disease without esophagitis - Controlled, will continue on current treatment regimen. - omeprazole (PRILOSEC) 20 MG capsule; TAKE ONE CAPSULE BY MOUTH 2 TIMES ADAY BEFORE A MEAL  Dispense: 120 capsule; Refill: 1  6. Health care maintenance - She was encouraged to complete charity care application for colonoscopy referral. - Ambulatory referral to Hematology / Oncology - Ambulatory referral to Gastroenterology   Follow-up: Return in about 2 weeks (around 05/16/2019), or if symptoms worsen or fail to improve.    Ilona Colley Jerold Coombe, NP

## 2019-05-02 NOTE — Patient Instructions (Signed)
Carbohydrate Counting for Diabetes Mellitus, Adult  Carbohydrate counting is a method of keeping track of how many carbohydrates you eat. Eating carbohydrates naturally increases the amount of sugar (glucose) in the blood. Counting how many carbohydrates you eat helps keep your blood glucose within normal limits, which helps you manage your diabetes (diabetes mellitus). It is important to know how many carbohydrates you can safely have in each meal. This is different for every person. A diet and nutrition specialist (registered dietitian) can help you make a meal plan and calculate how many carbohydrates you should have at each meal and snack. Carbohydrates are found in the following foods:  Grains, such as breads and cereals.  Dried beans and soy products.  Starchy vegetables, such as potatoes, peas, and corn.  Fruit and fruit juices.  Milk and yogurt.  Sweets and snack foods, such as cake, cookies, candy, chips, and soft drinks. How do I count carbohydrates? There are two ways to count carbohydrates in food. You can use either of the methods or a combination of both. Reading "Nutrition Facts" on packaged food The "Nutrition Facts" list is included on the labels of almost all packaged foods and beverages in the [place] It includes:  The serving size.  Information about nutrients in each serving, including the grams (g) of carbohydrate per serving. To use the "Nutrition Facts":  Decide how many servings you will have.  Multiply the number of servings by the number of carbohydrates per serving.  The resulting number is the total amount of carbohydrates that you will be having. Learning standard serving sizes of other foods When you eat carbohydrate foods that are not packaged or do not include "Nutrition Facts" on the label, you need to measure the servings in order to count the amount of carbohydrates:  Measure the foods that you will eat with a food scale or measuring cup, if needed.   Decide how many standard-size servings you will eat.  Multiply the number of servings by 15. Most carbohydrate-rich foods have about 15 g of carbohydrates per serving. ? For example, if you eat 8 oz (170 g) of strawberries, you will have eaten 2 servings and 30 g of carbohydrates (2 servings x 15 g = 30 g).  For foods that have more than one food mixed, such as soups and casseroles, you must count the carbohydrates in each food that is included. The following list contains standard serving sizes of common carbohydrate-rich foods. Each of these servings has about 15 g of carbohydrates:   hamburger bun or  English muffin.   oz (15 mL) syrup.   oz (14 g) jelly.  1 slice of bread.  1 six-inch tortilla.  3 oz (85 g) cooked rice or pasta.  4 oz (113 g) cooked dried beans.  4 oz (113 g) starchy vegetable, such as peas, corn, or potatoes.  4 oz (113 g) hot cereal.  4 oz (113 g) mashed potatoes or  of a large baked potato.  4 oz (113 g) canned or frozen fruit.  4 oz (120 mL) fruit juice.  4-6 crackers.  6 chicken nuggets.  6 oz (170 g) unsweetened dry cereal.  6 oz (170 g) plain fat-free yogurt or yogurt sweetened with artificial sweeteners.  8 oz (240 mL) milk.  8 oz (170 g) fresh fruit or one small piece of fruit.  24 oz (680 g) popped popcorn. Example of carbohydrate counting Sample meal  3 oz (85 g) chicken breast.  6 oz (170 g)   brown rice.  4 oz (113 g) corn.  8 oz (240 mL) milk.  8 oz (170 g) strawberries with sugar-free whipped topping. Carbohydrate calculation 1. Identify the foods that contain carbohydrates: ? Rice. ? Corn. ? Milk. ? Strawberries. 2. Calculate how many servings you have of each food: ? 2 servings rice. ? 1 serving corn. ? 1 serving milk. ? 1 serving strawberries. 3. Multiply each number of servings by 15 g: ? 2 servings rice x 15 g = 30 g. ? 1 serving corn x 15 g = 15 g. ? 1 serving milk x 15 g = 15 g. ? 1 serving  strawberries x 15 g = 15 g. 4. Add together all of the amounts to find the total grams of carbohydrates eaten: ? 30 g + 15 g + 15 g + 15 g = 75 g of carbohydrates total. Summary  Carbohydrate counting is a method of keeping track of how many carbohydrates you eat.  Eating carbohydrates naturally increases the amount of sugar (glucose) in the blood.  Counting how many carbohydrates you eat helps keep your blood glucose within normal limits, which helps you manage your diabetes.  A diet and nutrition specialist (registered dietitian) can help you make a meal plan and calculate how many carbohydrates you should have at each meal and snack. This information is not intended to replace advice given to you by your health care provider. Make sure you discuss any questions you have with your health care provider. Document Released: 08/15/2005 Document Revised: 03/09/2017 Document Reviewed: 01/27/2016 Elsevier Patient Education  2020 [employer] DASH Eating Plan DASH stands for "Dietary Approaches to Stop Hypertension." The DASH eating plan is a healthy eating plan that has been shown to reduce high blood pressure (hypertension). It may also reduce your risk for type 2 diabetes, heart disease, and stroke. The DASH eating plan may also help with weight loss. What are tips for following this plan?  General guidelines  Avoid eating more than 2,300 mg (milligrams) of salt (sodium) a day. If you have hypertension, you may need to reduce your sodium intake to 1,500 mg a day.  Limit alcohol intake to no more than 1 drink a day for nonpregnant women and 2 drinks a day for men. One drink equals 12 oz of beer, 5 oz of wine, or 1 oz of hard liquor.  Work with your health care provider to maintain a healthy body weight or to lose weight. Ask what an ideal weight is for you.  Get at least 30 minutes of exercise that causes your heart to beat faster (aerobic exercise) most days of the week. Activities may  include walking, swimming, or biking.  Work with your health care provider or diet and nutrition specialist (dietitian) to adjust your eating plan to your individual calorie needs. Reading food labels   Check food labels for the amount of sodium per serving. Choose foods with less than 5 percent of the Daily Value of sodium. Generally, foods with less than 300 mg of sodium per serving fit into this eating plan.  To find whole grains, look for the word "whole" as the first word in the ingredient list. Shopping  Buy products labeled as "low-sodium" or "no salt added."  Buy fresh foods. Avoid canned foods and premade or frozen meals. Cooking  Avoid adding salt when cooking. Use salt-free seasonings or herbs instead of table salt or sea salt. Check with your health care provider or pharmacist before using salt substitutes.    Do not fry foods. Cook foods using healthy methods such as baking, boiling, grilling, and broiling instead.  Cook with heart-healthy oils, such as olive, canola, soybean, or sunflower oil. Meal planning  Eat a balanced diet that includes: ? 5 or more servings of fruits and vegetables each day. At each meal, try to fill half of your plate with fruits and vegetables. ? Up to 6-8 servings of whole grains each day. ? Less than 6 oz of lean meat, poultry, or fish each day. A 3-oz serving of meat is about the same size as a deck of cards. One egg equals 1 oz. ? 2 servings of low-fat dairy each day. ? A serving of nuts, seeds, or beans 5 times each week. ? Heart-healthy fats. Healthy fats called Omega-3 fatty acids are found in foods such as flaxseeds and coldwater fish, like sardines, salmon, and mackerel.  Limit how much you eat of the following: ? Canned or prepackaged foods. ? Food that is high in trans fat, such as fried foods. ? Food that is high in saturated fat, such as fatty meat. ? Sweets, desserts, sugary drinks, and other foods with added sugar. ? Full-fat  dairy products.  Do not salt foods before eating.  Try to eat at least 2 vegetarian meals each week.  Eat more home-cooked food and less restaurant, buffet, and fast food.  When eating at a restaurant, ask that your food be prepared with less salt or no salt, if possible. What foods are recommended? The items listed may not be a complete list. Talk with your dietitian about what dietary choices are best for you. Grains Whole-grain or whole-wheat bread. Whole-grain or whole-wheat pasta. Brown rice. Oatmeal. Quinoa. Bulgur. Whole-grain and low-sodium cereals. Pita bread. Low-fat, low-sodium crackers. Whole-wheat flour tortillas. Vegetables Fresh or frozen vegetables (raw, steamed, roasted, or grilled). Low-sodium or reduced-sodium tomato and vegetable juice. Low-sodium or reduced-sodium tomato sauce and tomato paste. Low-sodium or reduced-sodium canned vegetables. Fruits All fresh, dried, or frozen fruit. Canned fruit in natural juice (without added sugar). Meat and other protein foods Skinless chicken or turkey. Ground chicken or turkey. Pork with fat trimmed off. Fish and seafood. Egg whites. Dried beans, peas, or lentils. Unsalted nuts, nut butters, and seeds. Unsalted canned beans. Lean cuts of beef with fat trimmed off. Low-sodium, lean deli meat. Dairy Low-fat (1%) or fat-free (skim) milk. Fat-free, low-fat, or reduced-fat cheeses. Nonfat, low-sodium ricotta or cottage cheese. Low-fat or nonfat yogurt. Low-fat, low-sodium cheese. Fats and oils Soft margarine without trans fats. Vegetable oil. Low-fat, reduced-fat, or light mayonnaise and salad dressings (reduced-sodium). Canola, safflower, olive, soybean, and sunflower oils. Avocado. Seasoning and other foods Herbs. Spices. Seasoning mixes without salt. Unsalted popcorn and pretzels. Fat-free sweets. What foods are not recommended? The items listed may not be a complete list. Talk with your dietitian about what dietary choices are best  for you. Grains Baked goods made with fat, such as croissants, muffins, or some breads. Dry pasta or rice meal packs. Vegetables Creamed or fried vegetables. Vegetables in a cheese sauce. Regular canned vegetables (not low-sodium or reduced-sodium). Regular canned tomato sauce and paste (not low-sodium or reduced-sodium). Regular tomato and vegetable juice (not low-sodium or reduced-sodium). Pickles. Olives. Fruits Canned fruit in a light or heavy syrup. Fried fruit. Fruit in cream or butter sauce. Meat and other protein foods Fatty cuts of meat. Ribs. Fried meat. Bacon. Sausage. Bologna and other processed lunch meats. Salami. Fatback. Hotdogs. Bratwurst. Salted nuts and seeds. Canned beans with   added salt. Canned or smoked fish. Whole eggs or egg yolks. Chicken or turkey with skin. Dairy Whole or 2% milk, cream, and half-and-half. Whole or full-fat cream cheese. Whole-fat or sweetened yogurt. Full-fat cheese. Nondairy creamers. Whipped toppings. Processed cheese and cheese spreads. Fats and oils Butter. Stick margarine. Lard. Shortening. Ghee. Bacon fat. Tropical oils, such as coconut, palm kernel, or palm oil. Seasoning and other foods Salted popcorn and pretzels. Onion salt, garlic salt, seasoned salt, table salt, and sea salt. Worcestershire sauce. Tartar sauce. Barbecue sauce. Teriyaki sauce. Soy sauce, including reduced-sodium. Steak sauce. Canned and packaged gravies. Fish sauce. Oyster sauce. Cocktail sauce. Horseradish that you find on the shelf. Ketchup. Mustard. Meat flavorings and tenderizers. Bouillon cubes. Hot sauce and Tabasco sauce. Premade or packaged marinades. Premade or packaged taco seasonings. Relishes. Regular salad dressings. Where to find more information:  National Heart, Lung, and Blood Institute: www.nhlbi.nih.gov  American Heart Association: www.heart.org Summary  The DASH eating plan is a healthy eating plan that has been shown to reduce high blood pressure  (hypertension). It may also reduce your risk for type 2 diabetes, heart disease, and stroke.  With the DASH eating plan, you should limit salt (sodium) intake to 2,300 mg a day. If you have hypertension, you may need to reduce your sodium intake to 1,500 mg a day.  When on the DASH eating plan, aim to eat more fresh fruits and vegetables, whole grains, lean proteins, low-fat dairy, and heart-healthy fats.  Work with your health care provider or diet and nutrition specialist (dietitian) to adjust your eating plan to your individual calorie needs. This information is not intended to replace advice given to you by your health care provider. Make sure you discuss any questions you have with your health care provider. Document Released: 08/04/2011 Document Revised: 07/28/2017 Document Reviewed: 08/08/2016 Elsevier Patient Education  2020 Elsevier Inc.  

## 2019-05-08 ENCOUNTER — Other Ambulatory Visit: Payer: Self-pay | Admitting: Gerontology

## 2019-05-08 ENCOUNTER — Ambulatory Visit: Payer: Self-pay

## 2019-05-09 ENCOUNTER — Other Ambulatory Visit: Payer: Self-pay

## 2019-05-09 ENCOUNTER — Telehealth: Payer: Self-pay

## 2019-05-09 DIAGNOSIS — Z1211 Encounter for screening for malignant neoplasm of colon: Secondary | ICD-10-CM

## 2019-05-09 MED ORDER — NA SULFATE-K SULFATE-MG SULF 17.5-3.13-1.6 GM/177ML PO SOLN: 1.0000 | Freq: Once | ORAL | 0 refills | Status: AC

## 2019-05-09 NOTE — Telephone Encounter (Signed)
Gastroenterology Pre-Procedure Review  Request Date: 06/03/19 Requesting Physician: Dr. Vicente Males  PATIENT REVIEW QUESTIONS: The patient responded to the following health history questions as indicated:    1. Are you having any GI issues? no 2. Do you have a personal history of Polyps? no 3. Do you have a family history of Colon Cancer or Polyps? no 4. Diabetes Mellitus? yes (oral meds) 5. Joint replacements in the past 12 months?no 6. Major health problems in the past 3 months?no 7. Any artificial heart valves, MVP, or defibrillator?no    MEDICATIONS & ALLERGIES:    Patient reports the following regarding taking any anticoagulation/antiplatelet therapy:   Plavix, Coumadin, Eliquis, Xarelto, Lovenox, Pradaxa, Brilinta, or Effient? no Aspirin? yes (81 mg daily)  Patient confirms/reports the following medications:  Current Outpatient Medications  Medication Sig Dispense Refill  . aspirin EC 81 MG tablet Take 1 tablet (81 mg total) by mouth daily.    . Dulaglutide (TRULICITY) 3.26 ZT/2.4PY SOPN Inject 0.75 mg into the skin once a week. 4 pen 4  . DULoxetine (CYMBALTA) 60 MG capsule Take 1 capsule (60 mg total) by mouth daily. 30 capsule 3  . ferrous sulfate 325 (65 FE) MG tablet TAKE ONE TABLET BY MOUTH EVERY DAY WITH BREAKFAST. 60 tablet 0  . furosemide (LASIX) 20 MG tablet Take 1 tablet (20 mg total) by mouth daily. Patient states that she takes every other day 90 tablet 0  . gabapentin (NEURONTIN) 400 MG capsule TAKE ONE CAPSULE BY MOUTH 2 TIMES A DAY AND 2 CAPSULES AT BEDTIME 120 capsule 0  . insulin glargine (LANTUS) 100 UNIT/ML injection Inject 0.18 mLs (18 Units total) into the skin daily. 10 mL 11  . lisinopril (ZESTRIL) 10 MG tablet Take 1 tablet (10 mg total) by mouth daily. 90 tablet 1  . metFORMIN (GLUCOPHAGE) 1000 MG tablet Take 0.5 tablets (500 mg total) by mouth 2 (two) times daily with a meal. 60 tablet 1  . Metoprolol Tartrate 75 MG TABS Take 75 mg by mouth 2 (two) times  daily. 180 tablet 1  . nitroGLYCERIN (NITROSTAT) 0.4 MG SL tablet 1 TABLET UNDER TONGUE AS NEEDED FOR CHEST PAIN EVERY 5 MINUTES FOR MAX OF 3 DOSES IN 15 MINUTES; IF NO RELIEF AFTER 1ST DOSE CALL 911 25 tablet 0  . Omega-3 Fatty Acids (FISH OIL) 1000 MG CAPS Take 1,000 mg by mouth 2 (two) times daily.    Marland Kitchen omeprazole (PRILOSEC) 20 MG capsule TAKE ONE CAPSULE BY MOUTH 2 TIMES ADAY BEFORE A MEAL 120 capsule 1  . pioglitazone (ACTOS) 30 MG tablet Take 1 tablet (30 mg total) by mouth daily. 90 tablet 1  . rosuvastatin (CRESTOR) 5 MG tablet Take 1 tablet (5 mg total) by mouth daily. 90 tablet 1   Current Facility-Administered Medications  Medication Dose Route Frequency Provider Last Rate Last Dose  . cyanocobalamin ((VITAMIN B-12)) injection 1,000 mcg  1,000 mcg Intramuscular Once Doles-Johnson, Teah, NP        Patient confirms/reports the following allergies:  Allergies  Allergen Reactions  . Etodolac Rash  . Penicillin G Rash  . Penicillins Rash    Has patient had a PCN reaction causing immediate rash, facial/tongue/throat swelling, SOB or lightheadedness with hypotension: Yes Has patient had a PCN reaction causing severe rash involving mucus membranes or skin necrosis: No Has patient had a PCN reaction that required hospitalization: No Has patient had a PCN reaction occurring within the last 10 years: No If all of the above answers are "  NO", then may proceed with Cephalosporin use.    No orders of the defined types were placed in this encounter.   AUTHORIZATION INFORMATION Primary Insurance: 1D#: Group #:  Secondary Insurance: 1D#: Group #:  SCHEDULE INFORMATION: Date:06/03/19  Time: Location:[facility]

## 2019-05-13 ENCOUNTER — Other Ambulatory Visit: Payer: Self-pay | Admitting: Gerontology

## 2019-05-14 ENCOUNTER — Telehealth: Payer: Self-pay | Admitting: Pharmacist

## 2019-05-14 ENCOUNTER — Other Ambulatory Visit: Payer: Self-pay

## 2019-05-14 NOTE — Telephone Encounter (Signed)
05/14/2019 8:44:37 AM - Lantus Solostar renewal to [facility]  05/14/2019 Faxed [employer] renewal application for Lantus Solostar Inject 30 units daily at bedtime #2.[name]

## 2019-05-16 ENCOUNTER — Other Ambulatory Visit: Payer: Self-pay

## 2019-05-16 ENCOUNTER — Encounter: Payer: Self-pay | Admitting: Gerontology

## 2019-05-16 ENCOUNTER — Ambulatory Visit: Payer: Self-pay | Admitting: Gerontology

## 2019-05-16 VITALS — BP 103/75 | HR 85 | Temp 96.3°F | Wt 199.8 lb

## 2019-05-16 DIAGNOSIS — Z794 Long term (current) use of insulin: Secondary | ICD-10-CM

## 2019-05-16 DIAGNOSIS — E1140 Type 2 diabetes mellitus with diabetic neuropathy, unspecified: Secondary | ICD-10-CM

## 2019-05-16 DIAGNOSIS — E1159 Type 2 diabetes mellitus with other circulatory complications: Secondary | ICD-10-CM

## 2019-05-16 DIAGNOSIS — E538 Deficiency of other specified B group vitamins: Secondary | ICD-10-CM

## 2019-05-16 DIAGNOSIS — I10 Essential (primary) hypertension: Secondary | ICD-10-CM

## 2019-05-16 DIAGNOSIS — Z0000 Encounter for general adult medical examination without abnormal findings: Secondary | ICD-10-CM

## 2019-05-16 DIAGNOSIS — R600 Localized edema: Secondary | ICD-10-CM

## 2019-05-16 MED ORDER — CYANOCOBALAMIN 1000 MCG/ML IJ SOLN: 1000.0000 ug | Freq: Once | INTRAMUSCULAR | Status: DC

## 2019-05-16 NOTE — Patient Instructions (Signed)
Carbohydrate Counting for Diabetes Mellitus, Adult  Carbohydrate counting is a method of keeping track of how many carbohydrates you eat. Eating carbohydrates naturally increases the amount of sugar (glucose) in the blood. Counting how many carbohydrates you eat helps keep your blood glucose within normal limits, which helps you manage your diabetes (diabetes mellitus). It is important to know how many carbohydrates you can safely have in each meal. This is different for every person. A diet and nutrition specialist (registered dietitian) can help you make a meal plan and calculate how many carbohydrates you should have at each meal and snack. Carbohydrates are found in the following foods:  Grains, such as breads and cereals.  Dried beans and soy products.  Starchy vegetables, such as potatoes, peas, and corn.  Fruit and fruit juices.  Milk and yogurt.  Sweets and snack foods, such as cake, cookies, candy, chips, and soft drinks. How do I count carbohydrates? There are two ways to count carbohydrates in food. You can use either of the methods or a combination of both. Reading "Nutrition Facts" on packaged food The "Nutrition Facts" list is included on the labels of almost all packaged foods and beverages in the [place] It includes:  The serving size.  Information about nutrients in each serving, including the grams (g) of carbohydrate per serving. To use the "Nutrition Facts":  Decide how many servings you will have.  Multiply the number of servings by the number of carbohydrates per serving.  The resulting number is the total amount of carbohydrates that you will be having. Learning standard serving sizes of other foods When you eat carbohydrate foods that are not packaged or do not include "Nutrition Facts" on the label, you need to measure the servings in order to count the amount of carbohydrates:  Measure the foods that you will eat with a food scale or measuring cup, if needed.   Decide how many standard-size servings you will eat.  Multiply the number of servings by 15. Most carbohydrate-rich foods have about 15 g of carbohydrates per serving. ? For example, if you eat 8 oz (170 g) of strawberries, you will have eaten 2 servings and 30 g of carbohydrates (2 servings x 15 g = 30 g).  For foods that have more than one food mixed, such as soups and casseroles, you must count the carbohydrates in each food that is included. The following list contains standard serving sizes of common carbohydrate-rich foods. Each of these servings has about 15 g of carbohydrates:   hamburger bun or  English muffin.   oz (15 mL) syrup.   oz (14 g) jelly.  1 slice of bread.  1 six-inch tortilla.  3 oz (85 g) cooked rice or pasta.  4 oz (113 g) cooked dried beans.  4 oz (113 g) starchy vegetable, such as peas, corn, or potatoes.  4 oz (113 g) hot cereal.  4 oz (113 g) mashed potatoes or  of a large baked potato.  4 oz (113 g) canned or frozen fruit.  4 oz (120 mL) fruit juice.  4-6 crackers.  6 chicken nuggets.  6 oz (170 g) unsweetened dry cereal.  6 oz (170 g) plain fat-free yogurt or yogurt sweetened with artificial sweeteners.  8 oz (240 mL) milk.  8 oz (170 g) fresh fruit or one small piece of fruit.  24 oz (680 g) popped popcorn. Example of carbohydrate counting Sample meal  3 oz (85 g) chicken breast.  6 oz (170 g)   brown rice.  4 oz (113 g) corn.  8 oz (240 mL) milk.  8 oz (170 g) strawberries with sugar-free whipped topping. Carbohydrate calculation 1. Identify the foods that contain carbohydrates: ? Rice. ? Corn. ? Milk. ? Strawberries. 2. Calculate how many servings you have of each food: ? 2 servings rice. ? 1 serving corn. ? 1 serving milk. ? 1 serving strawberries. 3. Multiply each number of servings by 15 g: ? 2 servings rice x 15 g = 30 g. ? 1 serving corn x 15 g = 15 g. ? 1 serving milk x 15 g = 15 g. ? 1 serving  strawberries x 15 g = 15 g. 4. Add together all of the amounts to find the total grams of carbohydrates eaten: ? 30 g + 15 g + 15 g + 15 g = 75 g of carbohydrates total. Summary  Carbohydrate counting is a method of keeping track of how many carbohydrates you eat.  Eating carbohydrates naturally increases the amount of sugar (glucose) in the blood.  Counting how many carbohydrates you eat helps keep your blood glucose within normal limits, which helps you manage your diabetes.  A diet and nutrition specialist (registered dietitian) can help you make a meal plan and calculate how many carbohydrates you should have at each meal and snack. This information is not intended to replace advice given to you by your health care provider. Make sure you discuss any questions you have with your health care provider. Document Released: 08/15/2005 Document Revised: 03/09/2017 Document Reviewed: 01/27/2016 Elsevier Patient Education  2020 [employer] DASH Eating Plan DASH stands for "Dietary Approaches to Stop Hypertension." The DASH eating plan is a healthy eating plan that has been shown to reduce high blood pressure (hypertension). It may also reduce your risk for type 2 diabetes, heart disease, and stroke. The DASH eating plan may also help with weight loss. What are tips for following this plan?  General guidelines  Avoid eating more than 2,300 mg (milligrams) of salt (sodium) a day. If you have hypertension, you may need to reduce your sodium intake to 1,500 mg a day.  Limit alcohol intake to no more than 1 drink a day for nonpregnant women and 2 drinks a day for men. One drink equals 12 oz of beer, 5 oz of wine, or 1 oz of hard liquor.  Work with your health care provider to maintain a healthy body weight or to lose weight. Ask what an ideal weight is for you.  Get at least 30 minutes of exercise that causes your heart to beat faster (aerobic exercise) most days of the week. Activities may  include walking, swimming, or biking.  Work with your health care provider or diet and nutrition specialist (dietitian) to adjust your eating plan to your individual calorie needs. Reading food labels   Check food labels for the amount of sodium per serving. Choose foods with less than 5 percent of the Daily Value of sodium. Generally, foods with less than 300 mg of sodium per serving fit into this eating plan.  To find whole grains, look for the word "whole" as the first word in the ingredient list. Shopping  Buy products labeled as "low-sodium" or "no salt added."  Buy fresh foods. Avoid canned foods and premade or frozen meals. Cooking  Avoid adding salt when cooking. Use salt-free seasonings or herbs instead of table salt or sea salt. Check with your health care provider or pharmacist before using salt substitutes.    Do not fry foods. Cook foods using healthy methods such as baking, boiling, grilling, and broiling instead.  Cook with heart-healthy oils, such as olive, canola, soybean, or sunflower oil. Meal planning  Eat a balanced diet that includes: ? 5 or more servings of fruits and vegetables each day. At each meal, try to fill half of your plate with fruits and vegetables. ? Up to 6-8 servings of whole grains each day. ? Less than 6 oz of lean meat, poultry, or fish each day. A 3-oz serving of meat is about the same size as a deck of cards. One egg equals 1 oz. ? 2 servings of low-fat dairy each day. ? A serving of nuts, seeds, or beans 5 times each week. ? Heart-healthy fats. Healthy fats called Omega-3 fatty acids are found in foods such as flaxseeds and coldwater fish, like sardines, salmon, and mackerel.  Limit how much you eat of the following: ? Canned or prepackaged foods. ? Food that is high in trans fat, such as fried foods. ? Food that is high in saturated fat, such as fatty meat. ? Sweets, desserts, sugary drinks, and other foods with added sugar. ? Full-fat  dairy products.  Do not salt foods before eating.  Try to eat at least 2 vegetarian meals each week.  Eat more home-cooked food and less restaurant, buffet, and fast food.  When eating at a restaurant, ask that your food be prepared with less salt or no salt, if possible. What foods are recommended? The items listed may not be a complete list. Talk with your dietitian about what dietary choices are best for you. Grains Whole-grain or whole-wheat bread. Whole-grain or whole-wheat pasta. Brown rice. Oatmeal. Quinoa. Bulgur. Whole-grain and low-sodium cereals. Pita bread. Low-fat, low-sodium crackers. Whole-wheat flour tortillas. Vegetables Fresh or frozen vegetables (raw, steamed, roasted, or grilled). Low-sodium or reduced-sodium tomato and vegetable juice. Low-sodium or reduced-sodium tomato sauce and tomato paste. Low-sodium or reduced-sodium canned vegetables. Fruits All fresh, dried, or frozen fruit. Canned fruit in natural juice (without added sugar). Meat and other protein foods Skinless chicken or turkey. Ground chicken or turkey. Pork with fat trimmed off. Fish and seafood. Egg whites. Dried beans, peas, or lentils. Unsalted nuts, nut butters, and seeds. Unsalted canned beans. Lean cuts of beef with fat trimmed off. Low-sodium, lean deli meat. Dairy Low-fat (1%) or fat-free (skim) milk. Fat-free, low-fat, or reduced-fat cheeses. Nonfat, low-sodium ricotta or cottage cheese. Low-fat or nonfat yogurt. Low-fat, low-sodium cheese. Fats and oils Soft margarine without trans fats. Vegetable oil. Low-fat, reduced-fat, or light mayonnaise and salad dressings (reduced-sodium). Canola, safflower, olive, soybean, and sunflower oils. Avocado. Seasoning and other foods Herbs. Spices. Seasoning mixes without salt. Unsalted popcorn and pretzels. Fat-free sweets. What foods are not recommended? The items listed may not be a complete list. Talk with your dietitian about what dietary choices are best  for you. Grains Baked goods made with fat, such as croissants, muffins, or some breads. Dry pasta or rice meal packs. Vegetables Creamed or fried vegetables. Vegetables in a cheese sauce. Regular canned vegetables (not low-sodium or reduced-sodium). Regular canned tomato sauce and paste (not low-sodium or reduced-sodium). Regular tomato and vegetable juice (not low-sodium or reduced-sodium). Pickles. Olives. Fruits Canned fruit in a light or heavy syrup. Fried fruit. Fruit in cream or butter sauce. Meat and other protein foods Fatty cuts of meat. Ribs. Fried meat. Bacon. Sausage. Bologna and other processed lunch meats. Salami. Fatback. Hotdogs. Bratwurst. Salted nuts and seeds. Canned beans with   added salt. Canned or smoked fish. Whole eggs or egg yolks. Chicken or turkey with skin. Dairy Whole or 2% milk, cream, and half-and-half. Whole or full-fat cream cheese. Whole-fat or sweetened yogurt. Full-fat cheese. Nondairy creamers. Whipped toppings. Processed cheese and cheese spreads. Fats and oils Butter. Stick margarine. Lard. Shortening. Ghee. Bacon fat. Tropical oils, such as coconut, palm kernel, or palm oil. Seasoning and other foods Salted popcorn and pretzels. Onion salt, garlic salt, seasoned salt, table salt, and sea salt. Worcestershire sauce. Tartar sauce. Barbecue sauce. Teriyaki sauce. Soy sauce, including reduced-sodium. Steak sauce. Canned and packaged gravies. Fish sauce. Oyster sauce. Cocktail sauce. Horseradish that you find on the shelf. Ketchup. Mustard. Meat flavorings and tenderizers. Bouillon cubes. Hot sauce and Tabasco sauce. Premade or packaged marinades. Premade or packaged taco seasonings. Relishes. Regular salad dressings. Where to find more information:  National Heart, Lung, and Blood Institute: www.nhlbi.nih.gov  American Heart Association: www.heart.org Summary  The DASH eating plan is a healthy eating plan that has been shown to reduce high blood pressure  (hypertension). It may also reduce your risk for type 2 diabetes, heart disease, and stroke.  With the DASH eating plan, you should limit salt (sodium) intake to 2,300 mg a day. If you have hypertension, you may need to reduce your sodium intake to 1,500 mg a day.  When on the DASH eating plan, aim to eat more fresh fruits and vegetables, whole grains, lean proteins, low-fat dairy, and heart-healthy fats.  Work with your health care provider or diet and nutrition specialist (dietitian) to adjust your eating plan to your individual calorie needs. This information is not intended to replace advice given to you by your health care provider. Make sure you discuss any questions you have with your health care provider. Document Released: 08/04/2011 Document Revised: 07/28/2017 Document Reviewed: 08/08/2016 Elsevier Patient Education  2020 Elsevier Inc.  

## 2019-05-16 NOTE — Progress Notes (Signed)
Established Patient Office Visit  Subjective:  Patient ID: Renee Bush, female    DOB: 19-Jun-1961  Age: 58 y.o. MRN: [id number]  CC:  Chief Complaint  Patient presents with  . Follow-up    [facility] provider who saw her over the phone wanted to see her in-person    HPI Renee Bush presents for follow up of controlled type 2 diabetes, hypertension and bilateral lower extremity edema. Her last HgbA1c done on 04/24/19 was  6.1 %. She states that she's compliant with her medications, checks her blood glucose twice daily. Her fasting blood glucose readings ranges between 83-130 mg/dl, and pre dinner readings ranges between 116-215 mg/dl. She denies hypoglycemic symptoms and performs daily foot checks. She states that she doesn't check her blood pressure at home, but complies with low salt diet and exercises as tolerated. She states that she continues to experience bilateral lower extremity edema, takes 20 mg of furosemide daily, and the edema resolves with elevation of her legs. She reports that she is doing well, denies any further concern.  Past Medical History:  Diagnosis Date  . Chronic kidney disease, stage III (moderate) ([place]) 10/06/2017  . Diabetes mellitus without complication ([place])   . GERD (gastroesophageal reflux disease)   . Hypercholesteremia   . Hypertension 09/10/2015    Past Surgical History:  Procedure Laterality Date  . APPENDECTOMY    . CESAREAN SECTION  1991    Family History  Problem Relation Age of Onset  . COPD Mother   . Alzheimer's disease Mother   . Hypertension Father   . Hyperlipidemia Father   . Heart attack Father   . Diabetes type II Father   . Diabetes type II Sister   . Diabetes type II Sister   . Gestational diabetes Daughter   . Diabetes type II Sister     Social History   Socioeconomic History  . Marital status: Married    Spouse name: Not on file  . Number of children: 4  . Years of education: Not on file  . Highest  education level: 11th grade  Occupational History  . Occupation: na  Social Needs  . Financial resource strain: Hard  . Food insecurity    Worry: Sometimes true    Inability: Sometimes true  . Transportation needs    Medical: No    Non-medical: No  Tobacco Use  . Smoking status: Former Smoker    Types: Cigarettes    Quit date: 04/17/2005    Years since quitting: 14.0  . Smokeless tobacco: Never Used  Substance and Sexual Activity  . Alcohol use: No    Alcohol/week: 14.0 standard drinks    Types: 14 Cans of beer per week    Comment: quit ~42moago  . Drug use: No  . Sexual activity: Not on file  Lifestyle  . Physical activity    Days per week: 6 days    Minutes per session: 60 min  . Stress: To some extent  Relationships  . Social connections    Talks on phone: More than three times a week    Gets together: More than three times a week    Attends religious service: Never    Active member of club or organization: No    Attends meetings of clubs or organizations: Never    Relationship status: Married  . Intimate partner violence    Fear of current or ex partner: No    Emotionally abused: No    Physically  abused: No    Forced sexual activity: No  Other Topics Concern  . Not on file  Social History Narrative   Lives in mobile home paid for    Outpatient Medications Prior to Visit  Medication Sig Dispense Refill  . aspirin EC 81 MG tablet Take 1 tablet (81 mg total) by mouth daily.    . Dulaglutide (TRULICITY) 9.52 WU/1.3KG SOPN Inject 0.75 mg into the skin once a week. 4 pen 4  . DULoxetine (CYMBALTA) 60 MG capsule TAKE ONE CAPSULE BY MOUTH EVERY DAY 30 capsule 0  . ferrous sulfate 325 (65 FE) MG tablet TAKE ONE TABLET BY MOUTH EVERY DAY WITH BREAKFAST. 60 tablet 0  . furosemide (LASIX) 20 MG tablet Take 1 tablet (20 mg total) by mouth daily. Patient states that she takes every other day 90 tablet 0  . gabapentin (NEURONTIN) 400 MG capsule TAKE ONE CAPSULE BY MOUTH 2  TIMES A DAY AND 2 CAPSULES AT BEDTIME 120 capsule 0  . insulin glargine (LANTUS) 100 UNIT/ML injection Inject 0.18 mLs (18 Units total) into the skin daily. 10 mL 11  . lisinopril (ZESTRIL) 10 MG tablet Take 1 tablet (10 mg total) by mouth daily. 90 tablet 1  . metFORMIN (GLUCOPHAGE) 1000 MG tablet Take 0.5 tablets (500 mg total) by mouth 2 (two) times daily with a meal. 60 tablet 1  . Metoprolol Tartrate 75 MG TABS Take 75 mg by mouth 2 (two) times daily. 180 tablet 1  . nitroGLYCERIN (NITROSTAT) 0.4 MG SL tablet 1 TABLET UNDER TONGUE AS NEEDED FOR CHEST PAIN EVERY 5 MINUTES FOR MAX OF 3 DOSES IN 15 MINUTES; IF NO RELIEF AFTER 1ST DOSE CALL 911 25 tablet 0  . Omega-3 Fatty Acids (FISH OIL) 1000 MG CAPS Take 1,000 mg by mouth 2 (two) times daily.    Marland Kitchen omeprazole (PRILOSEC) 20 MG capsule TAKE ONE CAPSULE BY MOUTH 2 TIMES ADAY BEFORE A MEAL 120 capsule 1  . rosuvastatin (CRESTOR) 5 MG tablet Take 1 tablet (5 mg total) by mouth daily. 90 tablet 1  . pioglitazone (ACTOS) 30 MG tablet Take 1 tablet (30 mg total) by mouth daily. 90 tablet 1   Facility-Administered Medications Prior to Visit  Medication Dose Route Frequency Provider Last Rate Last Dose  . cyanocobalamin ((VITAMIN B-12)) injection 1,000 mcg  1,000 mcg Intramuscular Once Doles-Johnson, Teah, NP        Allergies  Allergen Reactions  . Etodolac Rash  . Penicillin G Rash  . Penicillins Rash    Has patient had a PCN reaction causing immediate rash, facial/tongue/throat swelling, SOB or lightheadedness with hypotension: Yes Has patient had a PCN reaction causing severe rash involving mucus membranes or skin necrosis: No Has patient had a PCN reaction that required hospitalization: No Has patient had a PCN reaction occurring within the last 10 years: No If all of the above answers are "NO", then may proceed with Cephalosporin use.    ROS Review of Systems  Constitutional: Negative.   Eyes: Negative.   Respiratory: Negative.    Cardiovascular: Positive for leg swelling.  Endocrine: Negative.   Genitourinary: Negative.   Skin: Negative.   Neurological: Negative.   Psychiatric/Behavioral: Negative.       Objective:    Physical Exam  Constitutional: She is oriented to person, place, and time. She appears well-developed and well-nourished.  HENT:  Head: Normocephalic and atraumatic.  Eyes: Pupils are equal, round, and reactive to light. EOM are normal.  Neck: Normal range of  motion.  Cardiovascular: Normal rate and regular rhythm.  Pulmonary/Chest: Effort normal and breath sounds normal.  Abdominal: Soft. Bowel sounds are normal.  Musculoskeletal:        General: Edema (+1 edema to bilateral lower extremitites.) present.  Neurological: She is alert and oriented to person, place, and time.  Skin: Skin is warm.  Psychiatric: She has a normal mood and affect. Her behavior is normal. Judgment and thought content normal.    BP 103/75   Pulse 85   Temp (!) 96.3 F (35.7 C)   Wt 199 lb 12.8 oz (90.6 kg)   SpO2 97%   BMI 36.54 kg/m  Wt Readings from Last 3 Encounters:  05/16/19 199 lb 12.8 oz (90.6 kg)  05/02/19 196 lb 6.4 oz (89.1 kg)  10/11/18 203 lb 8 oz (92.3 kg)   She was encouraged to continue on weight loss regimen.  Health Maintenance Due  Topic Date Due  . Hepatitis C Screening  16-Apr-1961  . HIV Screening  07/23/1975  . PAP SMEAR-Modifier  07/22/1981  . MAMMOGRAM  07/22/2010  . COLONOSCOPY  07/22/2010    There are no preventive care reminders to display for this patient.  Lab Results  Component Value Date   TSH 0.763 11/09/2017   Lab Results  Component Value Date   WBC 4.9 04/24/2019   HGB 12.2 04/24/2019   HCT 36.8 04/24/2019   MCV 90 04/24/2019   PLT 224 04/24/2019   Lab Results  Component Value Date   NA 140 04/24/2019   K 4.3 04/24/2019   CO2 24 04/24/2019   GLUCOSE 104 (H) 04/24/2019   BUN 27 (H) 04/24/2019   CREATININE 1.35 (H) 04/24/2019   BILITOT 0.3  04/24/2019   ALKPHOS 52 04/24/2019   AST 9 04/24/2019   ALT 10 04/24/2019   PROT 6.3 04/24/2019   ALBUMIN 4.3 04/24/2019   CALCIUM 9.1 04/24/2019   ANIONGAP 8 12/02/2017   Lab Results  Component Value Date   CHOL 133 01/17/2019   Lab Results  Component Value Date   HDL 43 01/17/2019   Lab Results  Component Value Date   LDLCALC 68 01/17/2019   Lab Results  Component Value Date   TRIG 112 01/17/2019   Lab Results  Component Value Date   CHOLHDL 3.1 01/17/2019   Lab Results  Component Value Date   HGBA1C 6.1 (H) 04/24/2019      Assessment & Plan:      1. Type 2 diabetes mellitus with diabetic neuropathy, with long-term current use of insulin (HCC) - Her HgbA1c of  6.1% is below her goal, she will continue on current treatment regimen, Actos was discontinued. -Use Diabetic diet as advised  -Check blood sugar 2 times a day, once before breakfast and others 2 hours after lunch or dinner,write down the numbers against date in a log, bring log to clinic every visit -Take medications regularly as advised -Regular exercise - Comp Met (CMET); Future  2. Essential hypertension - She will continue on current treatment regimen, was advised to check, record and bring log to clinic visit. Continue on low sodium diet, exercise as tolerated. - She was provided with Blood pressure meter during office visit and was advised to check, record blood pressure daily and bring log to office visit. 3. Bilateral edema of lower extremity - Actos 30 mg was discontinued. - Elevate your legs up above heart level while resting - Wear compression stockings if standing or walking for a while -Reduce sodium  intake and limit fluid intake -Exercise daily as tolerated. - Comp Met (CMET); Future  4. Health care maintenance  - Pneumococcal polysaccharide vaccine 23-valent greater than or equal to 2yo subcutaneous/IM was administered  during office visit.  5. B12 deficiency - cyanocobalamin  ((VITAMIN B-12)) injection 1,000 mcg was administered during visit.   Follow-up: Return in about 26 days (around 06/11/2019), or if symptoms worsen or fail to improve.    Ashiah Karpowicz Jerold Coombe, NP

## 2019-05-28 ENCOUNTER — Ambulatory Visit: Payer: Self-pay | Admitting: Licensed Clinical Social Worker

## 2019-05-28 ENCOUNTER — Other Ambulatory Visit: Payer: Self-pay

## 2019-05-28 DIAGNOSIS — F331 Major depressive disorder, recurrent, moderate: Secondary | ICD-10-CM

## 2019-05-28 DIAGNOSIS — F411 Generalized anxiety disorder: Secondary | ICD-10-CM

## 2019-05-28 NOTE — BH Specialist Note (Signed)
Integrated Behavioral Health Follow Up Visit Via Phone  MRN: [id number] Name: Renee Bush  Type of Service: [place] Interpretor:No. Interpretor Name and Language: Not applicable.   SUBJECTIVE: Renee Bush is a 58 y.o. female accompanied by herself. Patient was referred by self for mental health. Patient reports the following symptoms/concerns: She notes that she has a colonoscopy on October 4th and is a little nervous about having because it will be the first time she has to have one. She notes that she is watching has four of her grandchildren today. She notes that her mother in law had a heart attack and had to have a stent put in her heart. She notes that its been stressful having to stay with her mother in law some and help her out with different things. She explains that her sister had a health scare and has also had to help her out some as while. She notes that she has a hair appointment tomorrow and is looking forward to it. She denies suicidal and homicidal thoughts.  Duration of problem: ; Severity of problem: mild  OBJECTIVE: Mood: Euthymic and Affect: Appropriate Risk of harm to self or others: No plan to harm self or others  LIFE CONTEXT: Family and Social: See above. School/Work: See above. Self-Care: See above. Life Changes: See above.  GOALS ADDRESSED: Patient will: 1.  Reduce symptoms of: stress  2.  Increase knowledge and/or ability of: coping skills and stress reduction  3.  Demonstrate ability to: Increase healthy adjustment to current life circumstances  INTERVENTIONS: Interventions utilized:  Supportive Counseling was utilized by the clinician during today's follow up session. Clinician processed with the patient regarding how she has been doing since the last follow up session. Clinician utilized reflective listening encouraging the patient to ventilate her feelings towards her current situation. Clinician  explained to th patient that despite her stress that she has been managing things well. Clinician encouraged the patient to take her medicine and utilize her coping skills.  Standardized Assessments completed: GAD-7 and PHQ 9  ASSESSMENT: Patient currently experiencing see above.  Patient may benefit from see above.  PLAN: 1. Follow up with behavioral health clinician on : three months or earlier if needed.  2. Behavioral recommendations: see above.  3. Referral(s): [place] (In Clinic) 4. "From scale of 1-10, how likely are you to follow plan?":   Bayard Hugger, LCSW

## 2019-05-30 ENCOUNTER — Other Ambulatory Visit
Admission: RE | Admit: 2019-05-30 | Discharge: 2019-05-30 | Disposition: A | Discharge status: Home / Self Care | Payer: Self-pay | Source: Ambulatory Visit | Attending: Gastroenterology | Admitting: Gastroenterology

## 2019-05-30 ENCOUNTER — Other Ambulatory Visit: Payer: Self-pay

## 2019-05-30 DIAGNOSIS — Z20828 Contact with and (suspected) exposure to other viral communicable diseases: Secondary | ICD-10-CM | POA: Insufficient documentation

## 2019-05-30 LAB — SARS CORONAVIRUS 2 (TAT 6-24 HRS): SARS Coronavirus 2: NEGATIVE

## 2019-05-31 ENCOUNTER — Telehealth: Payer: Self-pay | Admitting: Pharmacist

## 2019-05-31 NOTE — Telephone Encounter (Signed)
05/31/2019 10:24:16 AM - Lantus Solostar refill to [facility] ° 05/31/2019 Faxed [employer] refill request for Lantus Solostar Max daily dose 36 units, #3.AJ °

## 2019-06-03 ENCOUNTER — Ambulatory Visit
Admission: RE | Admit: 2019-06-03 | Discharge: 2019-06-03 | Disposition: A | Discharge status: Home / Self Care | Payer: Self-pay | Attending: Gastroenterology | Admitting: Gastroenterology

## 2019-06-03 ENCOUNTER — Other Ambulatory Visit: Payer: Self-pay

## 2019-06-03 ENCOUNTER — Ambulatory Visit: Payer: Self-pay | Admitting: Certified Registered Nurse Anesthetist

## 2019-06-03 ENCOUNTER — Encounter: Payer: Self-pay | Admitting: *Deleted

## 2019-06-03 ENCOUNTER — Telehealth: Payer: Self-pay | Admitting: Pharmacist

## 2019-06-03 ENCOUNTER — Encounter
Admission: RE | Disposition: A | Discharge status: Home / Self Care | Payer: Self-pay | Source: Home / Self Care | Attending: Gastroenterology

## 2019-06-03 DIAGNOSIS — N183 Chronic kidney disease, stage 3 unspecified: Secondary | ICD-10-CM | POA: Insufficient documentation

## 2019-06-03 DIAGNOSIS — Z794 Long term (current) use of insulin: Secondary | ICD-10-CM | POA: Insufficient documentation

## 2019-06-03 DIAGNOSIS — Z79899 Other long term (current) drug therapy: Secondary | ICD-10-CM | POA: Insufficient documentation

## 2019-06-03 DIAGNOSIS — Z1211 Encounter for screening for malignant neoplasm of colon: Secondary | ICD-10-CM | POA: Insufficient documentation

## 2019-06-03 DIAGNOSIS — I129 Hypertensive chronic kidney disease with stage 1 through stage 4 chronic kidney disease, or unspecified chronic kidney disease: Secondary | ICD-10-CM | POA: Insufficient documentation

## 2019-06-03 DIAGNOSIS — K219 Gastro-esophageal reflux disease without esophagitis: Secondary | ICD-10-CM | POA: Insufficient documentation

## 2019-06-03 DIAGNOSIS — E1122 Type 2 diabetes mellitus with diabetic chronic kidney disease: Secondary | ICD-10-CM | POA: Insufficient documentation

## 2019-06-03 DIAGNOSIS — E7800 Pure hypercholesterolemia, unspecified: Secondary | ICD-10-CM | POA: Insufficient documentation

## 2019-06-03 DIAGNOSIS — Z8371 Family history of colonic polyps: Secondary | ICD-10-CM | POA: Insufficient documentation

## 2019-06-03 DIAGNOSIS — Z83719 Family history of colon polyps, unspecified: Secondary | ICD-10-CM

## 2019-06-03 DIAGNOSIS — K5730 Diverticulosis of large intestine without perforation or abscess without bleeding: Secondary | ICD-10-CM | POA: Insufficient documentation

## 2019-06-03 HISTORY — PX: COLONOSCOPY WITH PROPOFOL: SHX5780

## 2019-06-03 LAB — GLUCOSE, CAPILLARY: Glucose-Capillary: 133 mg/dL — ABNORMAL HIGH (ref 70–99)

## 2019-06-03 SURGERY — COLONOSCOPY WITH PROPOFOL
Anesthesia: General

## 2019-06-03 MED ORDER — LIDOCAINE HCL (PF) 2 % IJ SOLN
INTRAMUSCULAR | Status: AC
  Filled 2019-06-03: qty 10

## 2019-06-03 MED ORDER — PROPOFOL 10 MG/ML IV BOLUS
INTRAVENOUS | Status: AC
  Filled 2019-06-03: qty 20

## 2019-06-03 MED ORDER — PROPOFOL 10 MG/ML IV BOLUS
INTRAVENOUS | Status: DC | PRN
  Administered 2019-06-03: 60 mg via INTRAVENOUS
  Administered 2019-06-03: 40 mg via INTRAVENOUS

## 2019-06-03 MED ORDER — PROPOFOL 500 MG/50ML IV EMUL
INTRAVENOUS | Status: DC | PRN
  Administered 2019-06-03: 150 ug/kg/min via INTRAVENOUS

## 2019-06-03 MED ORDER — LIDOCAINE HCL (CARDIAC) PF 100 MG/5ML IV SOSY
PREFILLED_SYRINGE | INTRAVENOUS | Status: DC | PRN
  Administered 2019-06-03: 50 mg via INTRATRACHEAL

## 2019-06-03 MED ORDER — PHENYLEPHRINE HCL (PRESSORS) 10 MG/ML IV SOLN
INTRAVENOUS | Status: DC | PRN
  Administered 2019-06-03: 200 ug via INTRAVENOUS
  Administered 2019-06-03 (×2): 100 ug via INTRAVENOUS
  Administered 2019-06-03 (×3): 200 ug via INTRAVENOUS

## 2019-06-03 MED ORDER — SODIUM CHLORIDE 0.9 % IV SOLN
INTRAVENOUS | Status: DC
  Administered 2019-06-03: 08:00:00 via INTRAVENOUS

## 2019-06-03 NOTE — Op Note (Signed)
[facility] Patient Name: Renee Bush Procedure Date: 06/03/2019 8:24 AM MRN: [id number] Account #: [id number] Date of Birth: 04-28-1961 Admit Type: Outpatient Age: 58 Room: [facility] ENDO ROOM 4 Gender: Female Note Status: Finalized Procedure:            Colonoscopy Indications:          Colon cancer screening in patient at increased risk:                        Family history of 1st-degree relative with colon polyps Providers:            Wyline Mood MD, MD Medicines:            Monitored Anesthesia Care Complications:        No immediate complications. Procedure:            Pre-Anesthesia Assessment:                       - Prior to the procedure, a History and Physical was                        performed, and patient medications, allergies and                        sensitivities were reviewed. The patient's tolerance of                        previous anesthesia was reviewed.                       - The risks and benefits of the procedure and the                        sedation options and risks were discussed with the                        patient. All questions were answered and informed                        consent was obtained.                       - ASA Grade Assessment: II - A patient with mild                        systemic disease.                       After obtaining informed consent, the colonoscope was                        passed under direct vision. Throughout the procedure,                        the patient's blood pressure, pulse, and oxygen                        saturations were monitored continuously. The                        Colonoscope was introduced through the anus and  advanced to the the cecum, identified by the                        appendiceal orifice. The colonoscopy was performed with                        ease. The patient tolerated the procedure well. The   quality of the bowel preparation was excellent. Findings:      The perianal and digital rectal examinations were normal.      Multiple small-mouthed diverticula were found in the left colon.      The exam was otherwise without abnormality on direct and retroflexion       views. Impression:           - Diverticulosis in the left colon.                       - The examination was otherwise normal on direct and                        retroflexion views.                       - No specimens collected. Recommendation:       - Discharge patient to home (with escort).                       - Resume previous diet.                       - Continue present medications.                       - Repeat colonoscopy in 5 years for screening purposes. Procedure Code(s):    --- Professional ---                       [phone], Colonoscopy, flexible; diagnostic, including                        collection of specimen(s) by brushing or washing, when                        performed (separate procedure) Diagnosis Code(s):    --- Professional ---                       Z83.71, Family history of colonic polyps                       K57.30, Diverticulosis of large intestine without                        perforation or abscess without bleeding CPT copyright 2019 American Medical Association. All rights reserved. The codes documented in this report are preliminary and upon coder review may  be revised to meet current compliance requirements. Jonathon Bellows, MD Jonathon Bellows MD, MD 06/03/2019 8:49:29 AM This report has been signed electronically. Number of Addenda: 0 Note Initiated On: 06/03/2019 8:24 AM Scope Withdrawal Time: 0 hours 12 minutes 12 seconds  Total Procedure Duration: 0 hours 16 minutes 31 seconds  Estimated Blood Loss: Estimated blood loss: none.      [facility]

## 2019-06-03 NOTE — H&P (Signed)
Wyline Mood, MD [address], [place], [place], [address] [address], [place], [place], [address] Phone: [phone]  Fax: [phone]  Primary Care Physician:  System, Provider Not In   Pre-Procedure History & Physical: HPI:  Renee Bush is a 58 y.o. female is here for an colonoscopy.   Past Medical History:  Diagnosis Date  . Chronic kidney disease, stage III (moderate) 10/06/2017  . Diabetes mellitus without complication ([facility])   . GERD (gastroesophageal reflux disease)   . Hypercholesteremia   . Hypertension 09/10/2015    Past Surgical History:  Procedure Laterality Date  . APPENDECTOMY    . CESAREAN SECTION  1991    Prior to Admission medications   Medication Sig Start Date End Date Taking? Authorizing Provider  Dulaglutide (TRULICITY) 0.75 MG/0.5ML SOPN Inject 0.75 mg into the skin once a week. 02/07/19  Yes Doles-Johnson, Teah, NP  DULoxetine (CYMBALTA) 60 MG capsule TAKE ONE CAPSULE BY MOUTH EVERY DAY 05/14/19  Yes McGowan, Carollee Herter A, PA-C  furosemide (LASIX) 20 MG tablet Take 1 tablet (20 mg total) by mouth daily. Patient states that she takes every other day 05/02/19  Yes Iloabachie, Chioma E, NP  gabapentin (NEURONTIN) 400 MG capsule TAKE ONE CAPSULE BY MOUTH 2 TIMES A DAY AND 2 CAPSULES AT BEDTIME 04/15/19  Yes Doles-Johnson, Teah, NP  insulin glargine (LANTUS) 100 UNIT/ML injection Inject 0.18 mLs (18 Units total) into the skin daily. 05/02/19  Yes Iloabachie, Chioma E, NP  lisinopril (ZESTRIL) 10 MG tablet Take 1 tablet (10 mg total) by mouth daily. 01/10/19  Yes Doles-Johnson, Teah, NP  metFORMIN (GLUCOPHAGE) 1000 MG tablet Take 0.5 tablets (500 mg total) by mouth 2 (two) times daily with a meal. 05/02/19  Yes Iloabachie, Chioma E, NP  Metoprolol Tartrate 75 MG TABS Take 75 mg by mouth 2 (two) times daily. 01/10/19  Yes Doles-Johnson, Teah, NP  omeprazole (PRILOSEC) 20 MG capsule TAKE ONE CAPSULE BY MOUTH 2 TIMES ADAY BEFORE A MEAL 05/02/19   Yes Iloabachie, Chioma E, NP  rosuvastatin (CRESTOR) 5 MG tablet Take 1 tablet (5 mg total) by mouth daily. 01/10/19  Yes Doles-Johnson, Teah, NP  aspirin EC 81 MG tablet Take 1 tablet (81 mg total) by mouth daily. 10/05/17   Lada, Janit Bern, MD  ferrous sulfate 325 (65 FE) MG tablet TAKE ONE TABLET BY MOUTH EVERY DAY WITH BREAKFAST. 02/08/19   Doles-Johnson, Teah, NP  nitroGLYCERIN (NITROSTAT) 0.4 MG SL tablet 1 TABLET UNDER TONGUE AS NEEDED FOR CHEST PAIN EVERY 5 MINUTES FOR MAX OF 3 DOSES IN 15 MINUTES; IF NO RELIEF AFTER 1ST DOSE CALL 911 04/15/19   Doles-Johnson, Teah, NP  Omega-3 Fatty Acids (FISH OIL) 1000 MG CAPS Take 1,000 mg by mouth 2 (two) times daily.    [provider]    Allergies as of 05/09/2019 - Review Complete 05/02/2019  Allergen Reaction Noted  . Etodolac Rash 11/23/2015  . Penicillin g Rash 11/23/2015  . Penicillins Rash 04/18/2015    Family History  Problem Relation Age of Onset  . COPD Mother   . Alzheimer's disease Mother   . Hypertension Father   . Hyperlipidemia Father   . Heart attack Father   . Diabetes type II Father   . Diabetes type II Sister   . Diabetes type II Sister   . Gestational diabetes Daughter   . Diabetes type II Sister     Social History   Socioeconomic History  . Marital status:  Married    Spouse name: Not on file  . Number of children: 4  . Years of education: Not on file  . Highest education level: 11th grade  Occupational History  . Occupation: na  Social Needs  . Financial resource strain: Hard  . Food insecurity    Worry: Sometimes true    Inability: Sometimes true  . Transportation needs    Medical: No    Non-medical: No  Tobacco Use  . Smoking status: Former Smoker    Types: Cigarettes    Quit date: 04/17/2005    Years since quitting: 14.1  . Smokeless tobacco: Never Used  Substance and Sexual Activity  . Alcohol use: No    Alcohol/week: 14.0 standard drinks    Types: 14 Cans of beer per week    Comment:  quit ~10mo ago  . Drug use: No  . Sexual activity: Not on file  Lifestyle  . Physical activity    Days per week: 6 days    Minutes per session: 60 min  . Stress: To some extent  Relationships  . Social connections    Talks on phone: More than three times a week    Gets together: More than three times a week    Attends religious service: Never    Active member of club or organization: No    Attends meetings of clubs or organizations: Never    Relationship status: Married  . Intimate partner violence    Fear of current or ex partner: No    Emotionally abused: No    Physically abused: No    Forced sexual activity: No  Other Topics Concern  . Not on file  Social History Narrative   Lives in mobile home paid for    Review of Systems: See HPI, otherwise negative ROS  Physical Exam: BP 124/71   Pulse 87   Temp (!) 97.5 F (36.4 C) (Tympanic)   Resp 18   Ht 5\' 2"  (1.575 m)   Wt 87.1 kg   SpO2 100%   BMI 35.12 kg/m  General:   Alert,  pleasant and cooperative in NAD Head:  Normocephalic and atraumatic. Neck:  Supple; no masses or thyromegaly. Lungs:  Clear throughout to auscultation, normal respiratory effort.    Heart:  +S1, +S2, Regular rate and rhythm, No edema. Abdomen:  Soft, nontender and nondistended. Normal bowel sounds, without guarding, and without rebound.   Neurologic:  Alert and  oriented x4;  grossly normal neurologically.  Impression/Plan: Renee Bush is here for an colonoscopy to be performed for family history of colon polyps in sister .Risks, benefits, limitations, and alternatives regarding  colonoscopy have been reviewed with the patient.  Questions have been answered.  All parties agreeable.   Jonathon Bellows, MD  06/03/2019, 8:19 AM

## 2019-06-03 NOTE — Telephone Encounter (Signed)
06/03/2019 8:11:42 AM - Ralph Leyden approval received for Trulicity  17/0/0174 Received approval letter from [facility] stating patient approved for 12 months (06/02/2020), this is for Trulicity.Renee Bush

## 2019-06-03 NOTE — Transfer of Care (Signed)
Immediate Anesthesia Transfer of Care Note  Patient: Renee Bush  Procedure(s) Performed: COLONOSCOPY WITH PROPOFOL (N/A )  Patient Location: PACU  Anesthesia Type:General  Level of Consciousness: awake, alert  and oriented  Airway & Oxygen Therapy: Patient Spontanous Breathing and Patient connected to nasal cannula oxygen  Post-op Assessment: Report given to RN and Post -op Vital signs reviewed and stable  Post vital signs: Reviewed and stable  Last Vitals:  Vitals Value Taken Time  BP 118/54 06/03/19 0853  Temp 36.1 C 06/03/19 0850  Pulse 77 06/03/19 0853  Resp 19 06/03/19 0853  SpO2 100 % 06/03/19 0853  Vitals shown include unvalidated device data.  Last Pain:  Vitals:   06/03/19 0850  TempSrc: Tympanic  PainSc: 0-No pain         Complications: No apparent anesthesia complications

## 2019-06-03 NOTE — Anesthesia Postprocedure Evaluation (Signed)
Anesthesia Post Note  Patient: Renee Bush  Procedure(s) Performed: COLONOSCOPY WITH PROPOFOL (N/A )  Patient location during evaluation: Endoscopy Anesthesia Type: General Level of consciousness: awake and alert Pain management: pain level controlled Vital Signs Assessment: post-procedure vital signs reviewed and stable Respiratory status: spontaneous breathing and respiratory function stable Cardiovascular status: stable Anesthetic complications: no     Last Vitals:  Vitals:   06/03/19 0850 06/03/19 0900  BP: (!) 87/44 (!) 87/61  Pulse: 78 81  Resp: 14 13  Temp: (!) 36.1 C   SpO2: 100% 100%    Last Pain:  Vitals:   06/03/19 0900  TempSrc:   PainSc: 0-No pain                 KEPHART,WILLIAM K

## 2019-06-03 NOTE — Anesthesia Preprocedure Evaluation (Signed)
Anesthesia Evaluation  Patient identified by MRN, date of birth, ID band Patient awake    Reviewed: Allergy & Precautions, NPO status , Patient's Chart, lab work & pertinent test results  History of Anesthesia Complications Negative for: history of anesthetic complications  Airway Mallampati: III       Dental   Pulmonary neg sleep apnea, neg COPD, Not current smoker, former smoker,           Cardiovascular hypertension, Pt. on medications (-) Past MI and (-) CHF (-) dysrhythmias (-) Valvular Problems/Murmurs     Neuro/Psych neg Seizures Anxiety    GI/Hepatic Neg liver ROS, GERD  Medicated and Controlled,  Endo/Other  diabetes, Type 2, Oral Hypoglycemic Agents, Insulin Dependent  Renal/GU Renal InsufficiencyRenal disease     Musculoskeletal   Abdominal   Peds  Hematology  (+) anemia ,   Anesthesia Other Findings   Reproductive/Obstetrics                             Anesthesia Physical Anesthesia Plan  ASA: III  Anesthesia Plan: General   Post-op Pain Management:    Induction: Intravenous  PONV Risk Score and Plan: 3 and Propofol infusion, TIVA and Treatment may vary due to age or medical condition  Airway Management Planned: Nasal Cannula  Additional Equipment:   Intra-op Plan:   Post-operative Plan:   Informed Consent: I have reviewed the patients History and Physical, chart, labs and discussed the procedure including the risks, benefits and alternatives for the proposed anesthesia with the patient or authorized representative who has indicated his/her understanding and acceptance.       Plan Discussed with:   Anesthesia Plan Comments:         Anesthesia Quick Evaluation

## 2019-06-03 NOTE — Anesthesia Post-op Follow-up Note (Signed)
Anesthesia QCDR form completed.        

## 2019-06-04 ENCOUNTER — Encounter: Payer: Self-pay | Admitting: Gastroenterology

## 2019-06-05 ENCOUNTER — Other Ambulatory Visit: Payer: Self-pay

## 2019-06-05 DIAGNOSIS — R600 Localized edema: Secondary | ICD-10-CM

## 2019-06-05 DIAGNOSIS — E1140 Type 2 diabetes mellitus with diabetic neuropathy, unspecified: Secondary | ICD-10-CM

## 2019-06-05 DIAGNOSIS — Z794 Long term (current) use of insulin: Secondary | ICD-10-CM

## 2019-06-06 LAB — COMPREHENSIVE METABOLIC PANEL
ALT: 14 IU/L (ref 0–32)
AST: 16 IU/L (ref 0–40)
Albumin/Globulin Ratio: 1.7 (ref 1.2–2.2)
Albumin: 3.8 g/dL (ref 3.8–4.9)
Alkaline Phosphatase: 60 IU/L (ref 39–117)
BUN/Creatinine Ratio: 9 (ref 9–23)
BUN: 10 mg/dL (ref 6–24)
Bilirubin Total: 0.3 mg/dL (ref 0.0–1.2)
CO2: 21 mmol/L (ref 20–29)
Calcium: 8.6 mg/dL — ABNORMAL LOW (ref 8.7–10.2)
Chloride: 107 mmol/L — ABNORMAL HIGH (ref 96–106)
Creatinine, Ser: 1.06 mg/dL — ABNORMAL HIGH (ref 0.57–1.00)
GFR calc Af Amer: 67 mL/min/{1.73_m2} (ref >59)
GFR calc non Af Amer: 58 mL/min/{1.73_m2} — ABNORMAL LOW (ref >59)
Globulin, Total: 2.2 g/dL (ref 1.5–4.5)
Glucose: 166 mg/dL — ABNORMAL HIGH (ref 65–99)
Potassium: 4.2 mmol/L (ref 3.5–5.2)
Sodium: 143 mmol/L (ref 134–144)
Total Protein: 6 g/dL (ref 6.0–8.5)

## 2019-06-11 ENCOUNTER — Ambulatory Visit: Payer: Self-pay | Admitting: Gerontology

## 2019-06-11 ENCOUNTER — Other Ambulatory Visit: Payer: Self-pay

## 2019-06-11 VITALS — BP 127/82 | HR 80 | Temp 97.3°F | Ht 62.0 in | Wt 195.0 lb

## 2019-06-11 DIAGNOSIS — R600 Localized edema: Secondary | ICD-10-CM

## 2019-06-11 DIAGNOSIS — Z794 Long term (current) use of insulin: Secondary | ICD-10-CM

## 2019-06-11 DIAGNOSIS — I152 Hypertension secondary to endocrine disorders: Secondary | ICD-10-CM

## 2019-06-11 DIAGNOSIS — E1140 Type 2 diabetes mellitus with diabetic neuropathy, unspecified: Secondary | ICD-10-CM

## 2019-06-11 DIAGNOSIS — E1159 Type 2 diabetes mellitus with other circulatory complications: Secondary | ICD-10-CM

## 2019-06-11 DIAGNOSIS — Z889 Allergy status to unspecified drugs, medicaments and biological substances status: Secondary | ICD-10-CM | POA: Insufficient documentation

## 2019-06-11 DIAGNOSIS — E538 Deficiency of other specified B group vitamins: Secondary | ICD-10-CM

## 2019-06-11 HISTORY — DX: Allergy status to unspecified drugs, medicaments and biological substances: Z88.9

## 2019-06-11 MED ORDER — FEXOFENADINE HCL 30 MG PO TBDP: 30.0000 mg | ORAL_TABLET | Freq: Every day | ORAL | 1 refills | Status: DC

## 2019-06-11 MED ORDER — GABAPENTIN 400 MG PO CAPS: ORAL_CAPSULE | ORAL | 0 refills | Status: DC

## 2019-06-11 MED ORDER — CYANOCOBALAMIN 1000 MCG/ML IJ SOLN: 1000.0000 ug | Freq: Once | INTRAMUSCULAR | Status: DC

## 2019-06-11 MED ORDER — FUROSEMIDE 20 MG PO TABS: 20.0000 mg | ORAL_TABLET | ORAL | 0 refills | Status: DC

## 2019-06-11 MED ORDER — INSULIN GLARGINE 100 UNIT/ML ~~LOC~~ SOLN: 10.0000 [IU] | Freq: Every day | SUBCUTANEOUS | 1 refills | Status: DC

## 2019-06-11 NOTE — Progress Notes (Signed)
Established Patient Office Visit  Subjective:  Patient ID: Renee Bush, female    DOB: February 11, 1961  Age: 58 y.o. MRN: [id number]  CC:  Chief Complaint  Patient presents with  . Diabetes  . Hypertension  . Hyperlipidemia    HPI ROBYNE Bush presents for follow up of controlled type 2 diabetes, peripheral edema and lab review. Her HgbA1c done on 04/24/2019 was 6.1%, she's compliant with her medications. She checks her fasting blood glucose daily and she reports that it's usually between 90-120 mg/dl. She denies hypo/hyperglycemic symptoms and performs her foot checks. She reports that her peripheral neuropathy is controlled by taking 400 mg gabapentin. She reports that she takes 20 mg Furosemide every other day and states that the swelling to her lower legs resolve with elevation. Her serum creatinine done 1 week ago improved from 1.35 mg/dl a month prior to 1.06 mg/dl. Her eGFR for non AA increased from 43 to 58 and eGFR for AA from 50 to 67. She requests her Vitamin B 12 injection, states that she's doing well and offers no further concerns.  Past Medical History:  Diagnosis Date  . Chronic kidney disease, stage III (moderate) 10/06/2017  . Diabetes mellitus without complication ([place])   . GERD (gastroesophageal reflux disease)   . Hypercholesteremia   . Hypertension 09/10/2015    Past Surgical History:  Procedure Laterality Date  . APPENDECTOMY    . CESAREAN SECTION  1991  . COLONOSCOPY WITH PROPOFOL N/A 06/03/2019   Procedure: COLONOSCOPY WITH PROPOFOL;  Surgeon: Jonathon Bellows, MD;  Location: [facility] ENDOSCOPY;  Service: Gastroenterology;  Laterality: N/A;    Family History  Problem Relation Age of Onset  . COPD Mother   . Alzheimer's disease Mother   . Hypertension Father   . Hyperlipidemia Father   . Heart attack Father   . Diabetes type II Father   . Diabetes type II Sister   . Diabetes type II Sister   . Gestational diabetes Daughter   . Diabetes type II Sister      Social History   Socioeconomic History  . Marital status: Married    Spouse name: Not on file  . Number of children: 4  . Years of education: Not on file  . Highest education level: 11th grade  Occupational History  . Occupation: na  Social Needs  . Financial resource strain: Hard  . Food insecurity    Worry: Sometimes true    Inability: Sometimes true  . Transportation needs    Medical: No    Non-medical: No  Tobacco Use  . Smoking status: Former Smoker    Types: Cigarettes    Quit date: 04/17/2005    Years since quitting: 14.1  . Smokeless tobacco: Never Used  Substance and Sexual Activity  . Alcohol use: No    Alcohol/week: 14.0 standard drinks    Types: 14 Cans of beer per week    Comment: quit ~18moago  . Drug use: No  . Sexual activity: Not on file  Lifestyle  . Physical activity    Days per week: 6 days    Minutes per session: 60 min  . Stress: To some extent  Relationships  . Social connections    Talks on phone: More than three times a week    Gets together: More than three times a week    Attends religious service: Never    Active member of club or organization: No    Attends meetings of clubs or  organizations: Never    Relationship status: Married  . Intimate partner violence    Fear of current or ex partner: No    Emotionally abused: No    Physically abused: No    Forced sexual activity: No  Other Topics Concern  . Not on file  Social History Narrative   Lives in mobile home paid for    Outpatient Medications Prior to Visit  Medication Sig Dispense Refill  . aspirin EC 81 MG tablet Take 1 tablet (81 mg total) by mouth daily.    . Dulaglutide (TRULICITY) 8.46 KZ/9.9JT SOPN Inject 0.75 mg into the skin once a week. 4 pen 4  . DULoxetine (CYMBALTA) 60 MG capsule TAKE ONE CAPSULE BY MOUTH EVERY DAY 30 capsule 0  . ferrous sulfate 325 (65 FE) MG tablet TAKE ONE TABLET BY MOUTH EVERY DAY WITH BREAKFAST. 60 tablet 0  . lisinopril (ZESTRIL) 10 MG  tablet Take 1 tablet (10 mg total) by mouth daily. 90 tablet 1  . metFORMIN (GLUCOPHAGE) 1000 MG tablet Take 0.5 tablets (500 mg total) by mouth 2 (two) times daily with a meal. 60 tablet 1  . Metoprolol Tartrate 75 MG TABS Take 75 mg by mouth 2 (two) times daily. 180 tablet 1  . nitroGLYCERIN (NITROSTAT) 0.4 MG SL tablet 1 TABLET UNDER TONGUE AS NEEDED FOR CHEST PAIN EVERY 5 MINUTES FOR MAX OF 3 DOSES IN 15 MINUTES; IF NO RELIEF AFTER 1ST DOSE CALL 911 25 tablet 0  . Omega-3 Fatty Acids (FISH OIL) 1000 MG CAPS Take 1,000 mg by mouth 2 (two) times daily.    Marland Kitchen omeprazole (PRILOSEC) 20 MG capsule TAKE ONE CAPSULE BY MOUTH 2 TIMES ADAY BEFORE A MEAL 120 capsule 1  . rosuvastatin (CRESTOR) 5 MG tablet Take 1 tablet (5 mg total) by mouth daily. 90 tablet 1  . furosemide (LASIX) 20 MG tablet Take 1 tablet (20 mg total) by mouth daily. Patient states that she takes every other day 90 tablet 0  . gabapentin (NEURONTIN) 400 MG capsule TAKE ONE CAPSULE BY MOUTH 2 TIMES A DAY AND 2 CAPSULES AT BEDTIME 120 capsule 0  . insulin glargine (LANTUS) 100 UNIT/ML injection Inject 0.18 mLs (18 Units total) into the skin daily. 10 mL 11   Facility-Administered Medications Prior to Visit  Medication Dose Route Frequency Provider Last Rate Last Dose  . cyanocobalamin ((VITAMIN B-12)) injection 1,000 mcg  1,000 mcg Intramuscular Once Diani Jillson E, NP        Allergies  Allergen Reactions  . Etodolac Rash  . Penicillin G Rash  . Penicillins Rash    Has patient had a PCN reaction causing immediate rash, facial/tongue/throat swelling, SOB or lightheadedness with hypotension: Yes Has patient had a PCN reaction causing severe rash involving mucus membranes or skin necrosis: No Has patient had a PCN reaction that required hospitalization: No Has patient had a PCN reaction occurring within the last 10 years: No If all of the above answers are "NO", then may proceed with Cephalosporin use.    ROS Review of  Systems  Constitutional: Negative.   Eyes: Negative.   Respiratory: Negative.   Cardiovascular: Negative.   Gastrointestinal: Negative.   Endocrine: Negative.   Genitourinary: Negative.   Skin: Negative.   Neurological: Negative.   Psychiatric/Behavioral: Negative.       Objective:    Physical Exam  Constitutional: She is oriented to person, place, and time. She appears well-developed and well-nourished.  HENT:  Head: Normocephalic and atraumatic.  Eyes: Pupils are equal, round, and reactive to light. EOM are normal.  Cardiovascular: Normal rate and regular rhythm.  Pulmonary/Chest: Effort normal and breath sounds normal.  Abdominal: Soft.  Musculoskeletal:        General: Edema (trace to bilateral lower legs) present.  Neurological: She is alert and oriented to person, place, and time.  Skin: Skin is warm and dry.  Psychiatric: She has a normal mood and affect. Her behavior is normal. Judgment and thought content normal.    BP 127/82 (BP Location: Left Arm, Patient Position: Sitting)   Pulse 80   Temp (!) 97.3 F (36.3 C)   Ht '5\' 2"'$  (1.575 m)   Wt 195 lb (88.5 kg)   SpO2 100%   BMI 35.67 kg/m  Wt Readings from Last 3 Encounters:  06/11/19 195 lb (88.5 kg)  06/03/19 192 lb (87.1 kg)  05/16/19 199 lb 12.8 oz (90.6 kg)   She was encouraged to continue on weight loss regimen.  Health Maintenance Due  Topic Date Due  . Hepatitis C Screening  1960/10/26  . HIV Screening  07/23/1975  . PAP SMEAR-Modifier  07/22/1981  . MAMMOGRAM  07/22/2010    There are no preventive care reminders to display for this patient.  Lab Results  Component Value Date   TSH 0.763 11/09/2017   Lab Results  Component Value Date   WBC 4.9 04/24/2019   HGB 12.2 04/24/2019   HCT 36.8 04/24/2019   MCV 90 04/24/2019   PLT 224 04/24/2019   Lab Results  Component Value Date   NA 143 06/05/2019   K 4.2 06/05/2019   CO2 21 06/05/2019   GLUCOSE 166 (H) 06/05/2019   BUN 10 06/05/2019    CREATININE 1.06 (H) 06/05/2019   BILITOT 0.3 06/05/2019   ALKPHOS 60 06/05/2019   AST 16 06/05/2019   ALT 14 06/05/2019   PROT 6.0 06/05/2019   ALBUMIN 3.8 06/05/2019   CALCIUM 8.6 (L) 06/05/2019   ANIONGAP 8 12/02/2017   Lab Results  Component Value Date   CHOL 133 01/17/2019   Lab Results  Component Value Date   HDL 43 01/17/2019   Lab Results  Component Value Date   LDLCALC 68 01/17/2019   Lab Results  Component Value Date   TRIG 112 01/17/2019   Lab Results  Component Value Date   CHOLHDL 3.1 01/17/2019   Lab Results  Component Value Date   HGBA1C 6.1 (H) 04/24/2019      Assessment & Plan:   1. Type 2 diabetes mellitus with diabetic neuropathy, with long-term current use of insulin (HCC) - Her HgbA1c was 6.1%, her Lantus was decreased to 10 units and she will follow up with Endocrinology. -Use Diabetic diet as advised  -Check blood sugar once before breakfast and goal is between 80-130 mg/dl. -Take medications regularly as advised -Regular exercise as tolerated - gabapentin (NEURONTIN) 400 MG capsule; TAKE ONE CAPSULE BY MOUTH 2 TIMES A DAY AND 2 CAPSULES AT BEDTIME  Dispense: 120 capsule; Refill: 0 - Ambulatory referral to Endocrinology - insulin glargine (LANTUS) 100 UNIT/ML injection; Inject 0.1 mLs (10 Units total) into the skin daily.  Dispense: 10 mL; Refill: 1 - HgB A1c; Future - Basic Metabolic Panel (BMET); Future  2. B12 deficiency  - cyanocobalamin ((VITAMIN B-12)) injection 1,000 mcg was administered  3. Hypertension associated with diabetes ([place]) - She will continue on current treatment regimen. -Low salt DASH diet -Take medications regularly on time -Exercise regularly as tolerated -Check blood  pressure at least once a week at home or a nearby pharmacy and record -Goal is less than 140/90 and normal blood pressure is 793/96 - Basic Metabolic Panel (BMET); Future  4. Bilateral edema of lower extremity - She will continue on current  treatment regimen. -Elevate your legs up above heart level while resting - Wear compression stockings if standing or walking for a while -Reduce sodium intake and limit fluid intake -Exercise daily as tolerated. - furosemide (LASIX) 20 MG tablet; Take 1 tablet (20 mg total) by mouth every other day. Patient states that she takes every other day  Dispense: 90 tablet; Refill: 0  5. History of allergy - She will continue on current treatment regimen. - fexofenadine (ALLEGRA ODT) 30 MG disintegrating tablet; Take 1 tablet (30 mg total) by mouth daily.  Dispense: 30 tablet; Refill: 1     Follow-up: Return in about 8 weeks (around 08/06/2019), or if symptoms worsen or fail to improve.    Breyon Blass Jerold Coombe, NP

## 2019-06-11 NOTE — Patient Instructions (Signed)
Carbohydrate Counting for Diabetes Mellitus, Adult  Carbohydrate counting is a method of keeping track of how many carbohydrates you eat. Eating carbohydrates naturally increases the amount of sugar (glucose) in the blood. Counting how many carbohydrates you eat helps keep your blood glucose within normal limits, which helps you manage your diabetes (diabetes mellitus). It is important to know how many carbohydrates you can safely have in each meal. This is different for every person. A diet and nutrition specialist (registered dietitian) can help you make a meal plan and calculate how many carbohydrates you should have at each meal and snack. Carbohydrates are found in the following foods:  Grains, such as breads and cereals.  Dried beans and soy products.  Starchy vegetables, such as potatoes, peas, and corn.  Fruit and fruit juices.  Milk and yogurt.  Sweets and snack foods, such as cake, cookies, candy, chips, and soft drinks. How do I count carbohydrates? There are two ways to count carbohydrates in food. You can use either of the methods or a combination of both. Reading "Nutrition Facts" on packaged food The "Nutrition Facts" list is included on the labels of almost all packaged foods and beverages in the [place] It includes:  The serving size.  Information about nutrients in each serving, including the grams (g) of carbohydrate per serving. To use the "Nutrition Facts":  Decide how many servings you will have.  Multiply the number of servings by the number of carbohydrates per serving.  The resulting number is the total amount of carbohydrates that you will be having. Learning standard serving sizes of other foods When you eat carbohydrate foods that are not packaged or do not include "Nutrition Facts" on the label, you need to measure the servings in order to count the amount of carbohydrates:  Measure the foods that you will eat with a food scale or measuring cup, if needed.   Decide how many standard-size servings you will eat.  Multiply the number of servings by 15. Most carbohydrate-rich foods have about 15 g of carbohydrates per serving. ? For example, if you eat 8 oz (170 g) of strawberries, you will have eaten 2 servings and 30 g of carbohydrates (2 servings x 15 g = 30 g).  For foods that have more than one food mixed, such as soups and casseroles, you must count the carbohydrates in each food that is included. The following list contains standard serving sizes of common carbohydrate-rich foods. Each of these servings has about 15 g of carbohydrates:   hamburger bun or  English muffin.   oz (15 mL) syrup.   oz (14 g) jelly.  1 slice of bread.  1 six-inch tortilla.  3 oz (85 g) cooked rice or pasta.  4 oz (113 g) cooked dried beans.  4 oz (113 g) starchy vegetable, such as peas, corn, or potatoes.  4 oz (113 g) hot cereal.  4 oz (113 g) mashed potatoes or  of a large baked potato.  4 oz (113 g) canned or frozen fruit.  4 oz (120 mL) fruit juice.  4-6 crackers.  6 chicken nuggets.  6 oz (170 g) unsweetened dry cereal.  6 oz (170 g) plain fat-free yogurt or yogurt sweetened with artificial sweeteners.  8 oz (240 mL) milk.  8 oz (170 g) fresh fruit or one small piece of fruit.  24 oz (680 g) popped popcorn. Example of carbohydrate counting Sample meal  3 oz (85 g) chicken breast.  6 oz (170 g)   brown rice.  4 oz (113 g) corn.  8 oz (240 mL) milk.  8 oz (170 g) strawberries with sugar-free whipped topping. Carbohydrate calculation 1. Identify the foods that contain carbohydrates: ? Rice. ? Corn. ? Milk. ? Strawberries. 2. Calculate how many servings you have of each food: ? 2 servings rice. ? 1 serving corn. ? 1 serving milk. ? 1 serving strawberries. 3. Multiply each number of servings by 15 g: ? 2 servings rice x 15 g = 30 g. ? 1 serving corn x 15 g = 15 g. ? 1 serving milk x 15 g = 15 g. ? 1 serving  strawberries x 15 g = 15 g. 4. Add together all of the amounts to find the total grams of carbohydrates eaten: ? 30 g + 15 g + 15 g + 15 g = 75 g of carbohydrates total. Summary  Carbohydrate counting is a method of keeping track of how many carbohydrates you eat.  Eating carbohydrates naturally increases the amount of sugar (glucose) in the blood.  Counting how many carbohydrates you eat helps keep your blood glucose within normal limits, which helps you manage your diabetes.  A diet and nutrition specialist (registered dietitian) can help you make a meal plan and calculate how many carbohydrates you should have at each meal and snack. This information is not intended to replace advice given to you by your health care provider. Make sure you discuss any questions you have with your health care provider. Document Released: 08/15/2005 Document Revised: 03/09/2017 Document Reviewed: 01/27/2016 Elsevier Patient Education  2020 Elsevier Inc.  

## 2019-06-17 ENCOUNTER — Telehealth: Payer: Self-pay | Admitting: Pharmacist

## 2019-06-17 NOTE — Telephone Encounter (Signed)
06/17/2019 43:83:77 AM - Trulicity refill faxed to [facility]  06/17/2019 I have received refill request back signed by provider for Trulicity Injec 0.75mg  into the skin once a week-back to [facility].Renee Bush

## 2019-06-18 ENCOUNTER — Ambulatory Visit: Payer: Self-pay

## 2019-06-18 ENCOUNTER — Other Ambulatory Visit: Payer: Self-pay

## 2019-06-19 ENCOUNTER — Telehealth: Payer: Self-pay | Admitting: Pharmacist

## 2019-06-19 NOTE — Telephone Encounter (Signed)
06/19/2019 12:27:53 PM - Lantus Solostar to [facility] Dose Change  06/19/2019 Lantus Solostar refill request to [facility] for Dr. Mable Fill to sign--DOSE CHANGE-Inject 18 units under the skin daily.Renee Bush

## 2019-07-10 ENCOUNTER — Other Ambulatory Visit: Payer: Self-pay

## 2019-07-10 ENCOUNTER — Other Ambulatory Visit: Payer: Self-pay | Admitting: Urology

## 2019-07-11 ENCOUNTER — Other Ambulatory Visit: Payer: Self-pay | Admitting: Gerontology

## 2019-07-11 DIAGNOSIS — E7800 Pure hypercholesterolemia, unspecified: Secondary | ICD-10-CM

## 2019-07-11 DIAGNOSIS — E1140 Type 2 diabetes mellitus with diabetic neuropathy, unspecified: Secondary | ICD-10-CM

## 2019-07-16 ENCOUNTER — Other Ambulatory Visit: Payer: Self-pay

## 2019-07-16 ENCOUNTER — Ambulatory Visit: Payer: Self-pay | Admitting: Licensed Clinical Social Worker

## 2019-07-16 DIAGNOSIS — F3341 Major depressive disorder, recurrent, in partial remission: Secondary | ICD-10-CM

## 2019-07-16 DIAGNOSIS — F411 Generalized anxiety disorder: Secondary | ICD-10-CM

## 2019-07-16 NOTE — BH Specialist Note (Signed)
Integrated Behavioral Health Follow Up Visit Via Phone  MRN: [id number] Name: CYLAH FANNIN   Type of Service: [place] Interpretor:No. Interpretor Name and Language: not applicable.   SUBJECTIVE: SAMMI STOLARZ is a 58 y.o. female accompanied by herself. Patient was referred by self for mental health. Patient reports the following symptoms/concerns: She explains that several of her family members have been sick so she has not been able to be around them much at all. She notes that her niece passed away all of a sudden in October and has yet to know what she died from. She explains that the family was in shock and she had to help with a lot of arrangements. She notes that her sister's husband is sick and is in the hospital in [place]. She notes that her husband will have to have surgery in December. She denies feeling down and depressed. She explains that she has had some anxiety but its been manageable. She denies suicidal and homicidal thoughts.  Duration of problem:  Severity of problem: mild  OBJECTIVE: Mood: Euthymic and Affect: Appropriate Risk of harm to self or others: No plan to harm self or others  LIFE CONTEXT: Family and Social: see above. School/Work: see above. Self-Care: see above. Life Changes: see above.  GOALS ADDRESSED: Patient will: 1.  Reduce symptoms of: stress  2.  Increase knowledge and/or ability of: self-management skills  3.  Demonstrate ability to: Increase healthy adjustment to current life circumstances  INTERVENTIONS: Interventions utilized:  Brief CBT was utilized by the clinician focusing on relapse prevention and improvement in mental health symptoms. Clinician processed with the patient regarding how she has been doing since the last follow up session. Clinician utilized reflective listening encouraging the patient to ventilate her feelings towards her current situation. Clinician explained to the  patient that it sounds like she has been under a lot of stress. Clinician encouraged the patient to continue to take her psychotropic medications and utilize her coping skills.  Standardized Assessments completed: GAD-7 and PHQ 9  ASSESSMENT: Patient currently experiencing see above.  Patient may benefit from see above.  PLAN: 1. Follow up with behavioral health clinician on : two months or earlier if needed.  2. Behavioral recommendations: see above. 3. Referral(s): [facility] (In Clinic) 4. "From scale of 1-10, how likely are you to follow plan?":   Bayard Hugger, LCSW

## 2019-07-17 ENCOUNTER — Other Ambulatory Visit: Payer: Self-pay | Admitting: Urology

## 2019-07-18 ENCOUNTER — Telehealth: Payer: Self-pay | Admitting: Pharmacist

## 2019-07-18 NOTE — Telephone Encounter (Signed)
07/18/2019 2:58:11 PM - Cymbalta  07/18/2019 I received a pharmacy printout from [place] to order Cymbalt (for Duloxetine-currently not on [place]) I have printed [employer] application mailing patient her portion and taking provider portion to [facility]. Cymbalta 60 mg Take one capsule by mouth every day #4 bottles.Renee Bush

## 2019-07-19 ENCOUNTER — Other Ambulatory Visit: Payer: Self-pay

## 2019-07-19 MED ORDER — DULOXETINE HCL 60 MG PO CPEP: 60.0000 mg | ORAL_CAPSULE | Freq: Every day | ORAL | 0 refills | Status: DC

## 2019-07-31 ENCOUNTER — Other Ambulatory Visit: Payer: Self-pay

## 2019-07-31 DIAGNOSIS — E1159 Type 2 diabetes mellitus with other circulatory complications: Secondary | ICD-10-CM

## 2019-07-31 DIAGNOSIS — Z794 Long term (current) use of insulin: Secondary | ICD-10-CM

## 2019-07-31 NOTE — Progress Notes (Unsigned)
cmp

## 2019-08-01 LAB — SPECIMEN STATUS

## 2019-08-02 ENCOUNTER — Telehealth: Payer: Self-pay | Admitting: Pharmacist

## 2019-08-02 NOTE — Telephone Encounter (Signed)
08/02/2019 10:24:09 AM - Cymbalta  26/02/1244 Faxed [employer] application for enrollment-Cymbalta 60mg  Take 1 capsule by mouth once daily.[name]

## 2019-08-05 LAB — BASIC METABOLIC PANEL
BUN/Creatinine Ratio: 19 (ref 9–23)
BUN: 17 mg/dL (ref 6–24)
CO2: 24 mmol/L (ref 20–29)
Calcium: 8.6 mg/dL — ABNORMAL LOW (ref 8.7–10.2)
Chloride: 103 mmol/L (ref 96–106)
Creatinine, Ser: 0.91 mg/dL (ref 0.57–1.00)
GFR calc Af Amer: 80 mL/min/{1.73_m2} (ref >59)
GFR calc non Af Amer: 69 mL/min/{1.73_m2} (ref >59)
Glucose: 171 mg/dL — ABNORMAL HIGH (ref 65–99)
Potassium: 4.5 mmol/L (ref 3.5–5.2)
Sodium: 140 mmol/L (ref 134–144)

## 2019-08-05 LAB — SPECIMEN STATUS REPORT

## 2019-08-06 ENCOUNTER — Ambulatory Visit: Payer: Self-pay | Admitting: Gerontology

## 2019-08-06 ENCOUNTER — Telehealth: Payer: Self-pay | Admitting: Pharmacist

## 2019-08-06 NOTE — Telephone Encounter (Signed)
08/06/2019 4:16:24 PM - Lantus Solostar refill to [facility]  08/06/2019 Faxed [employer] refill request for Dose Change-Lantus Solostar Inject 18 units under the skin once daily.[name]

## 2019-08-08 ENCOUNTER — Other Ambulatory Visit: Payer: Self-pay

## 2019-08-10 ENCOUNTER — Encounter: Payer: Self-pay | Admitting: Emergency Medicine

## 2019-08-10 ENCOUNTER — Other Ambulatory Visit: Payer: Self-pay

## 2019-08-10 ENCOUNTER — Emergency Department
Admission: EM | Admit: 2019-08-10 | Discharge: 2019-08-10 | Disposition: A | Discharge status: Home / Self Care | Payer: Self-pay | Attending: Emergency Medicine | Admitting: Emergency Medicine

## 2019-08-10 ENCOUNTER — Emergency Department: Payer: Self-pay

## 2019-08-10 DIAGNOSIS — R1011 Right upper quadrant pain: Secondary | ICD-10-CM | POA: Insufficient documentation

## 2019-08-10 DIAGNOSIS — R000 Tachycardia, unspecified: Secondary | ICD-10-CM | POA: Insufficient documentation

## 2019-08-10 DIAGNOSIS — I129 Hypertensive chronic kidney disease with stage 1 through stage 4 chronic kidney disease, or unspecified chronic kidney disease: Secondary | ICD-10-CM | POA: Insufficient documentation

## 2019-08-10 DIAGNOSIS — Z794 Long term (current) use of insulin: Secondary | ICD-10-CM | POA: Insufficient documentation

## 2019-08-10 DIAGNOSIS — Z87891 Personal history of nicotine dependence: Secondary | ICD-10-CM | POA: Insufficient documentation

## 2019-08-10 DIAGNOSIS — R531 Weakness: Secondary | ICD-10-CM | POA: Insufficient documentation

## 2019-08-10 DIAGNOSIS — Z79899 Other long term (current) drug therapy: Secondary | ICD-10-CM | POA: Insufficient documentation

## 2019-08-10 DIAGNOSIS — R82998 Other abnormal findings in urine: Secondary | ICD-10-CM | POA: Insufficient documentation

## 2019-08-10 DIAGNOSIS — R112 Nausea with vomiting, unspecified: Secondary | ICD-10-CM | POA: Insufficient documentation

## 2019-08-10 DIAGNOSIS — Z7982 Long term (current) use of aspirin: Secondary | ICD-10-CM | POA: Insufficient documentation

## 2019-08-10 DIAGNOSIS — R0602 Shortness of breath: Secondary | ICD-10-CM | POA: Insufficient documentation

## 2019-08-10 DIAGNOSIS — E1122 Type 2 diabetes mellitus with diabetic chronic kidney disease: Secondary | ICD-10-CM | POA: Insufficient documentation

## 2019-08-10 DIAGNOSIS — N183 Chronic kidney disease, stage 3 unspecified: Secondary | ICD-10-CM | POA: Insufficient documentation

## 2019-08-10 DIAGNOSIS — Z20828 Contact with and (suspected) exposure to other viral communicable diseases: Secondary | ICD-10-CM | POA: Insufficient documentation

## 2019-08-10 LAB — BASIC METABOLIC PANEL
Anion gap: 12 (ref 5–15)
BUN: 24 mg/dL — ABNORMAL HIGH (ref 6–20)
CO2: 20 mmol/L — ABNORMAL LOW (ref 22–32)
Calcium: 9.3 mg/dL (ref 8.9–10.3)
Chloride: 104 mmol/L (ref 98–111)
Creatinine, Ser: 1.03 mg/dL — ABNORMAL HIGH (ref 0.44–1.00)
GFR calc Af Amer: >60 mL/min (ref >60)
GFR calc non Af Amer: 59 mL/min — ABNORMAL LOW (ref >60)
Glucose, Bld: 303 mg/dL — ABNORMAL HIGH (ref 70–99)
Potassium: 4 mmol/L (ref 3.5–5.1)
Sodium: 136 mmol/L (ref 135–145)

## 2019-08-10 LAB — TROPONIN I (HIGH SENSITIVITY)
Troponin I (High Sensitivity): 22 ng/L — ABNORMAL HIGH (ref <18)
Troponin I (High Sensitivity): 20 ng/L — ABNORMAL HIGH (ref <18)

## 2019-08-10 LAB — CBC
HCT: 43.1 % (ref 36.0–46.0)
Hemoglobin: 14.9 g/dL (ref 12.0–15.0)
MCH: 30.3 pg (ref 26.0–34.0)
MCHC: 34.6 g/dL (ref 30.0–36.0)
MCV: 87.6 fL (ref 80.0–100.0)
Platelets: 248 10*3/uL (ref 150–400)
RBC: 4.92 MIL/uL (ref 3.87–5.11)
RDW: 13 % (ref 11.5–15.5)
WBC: 7.1 10*3/uL (ref 4.0–10.5)
nRBC: 0 % (ref 0.0–0.2)

## 2019-08-10 MED ORDER — SODIUM CHLORIDE 0.9 % IV BOLUS
1000.0000 mL | Freq: Once | INTRAVENOUS | Status: AC
  Administered 2019-08-10: 16:00:00 1000 mL via INTRAVENOUS

## 2019-08-10 MED ORDER — ONDANSETRON HCL 4 MG PO TABS: 4.0000 mg | ORAL_TABLET | Freq: Three times a day (TID) | ORAL | 0 refills | Status: DC | PRN

## 2019-08-10 MED ORDER — SODIUM CHLORIDE 0.9 % IV BOLUS
1000.0000 mL | Freq: Once | INTRAVENOUS | Status: AC
  Administered 2019-08-10: 19:00:00 1000 mL via INTRAVENOUS

## 2019-08-10 MED ORDER — ONDANSETRON HCL 4 MG/2ML IJ SOLN
4.0000 mg | Freq: Once | INTRAMUSCULAR | Status: AC
  Administered 2019-08-10: 17:00:00 4 mg via INTRAVENOUS
  Filled 2019-08-10: qty 2

## 2019-08-10 MED ORDER — SODIUM CHLORIDE 0.9% FLUSH: 3.0000 mL | Freq: Once | INTRAVENOUS | Status: DC

## 2019-08-10 NOTE — ED Triage Notes (Addendum)
States nausea and weakness x 5 days. Denies fever. States vomited one day. Has been resting in bed but not taking fluids well. Tachycardia noted in triage. Denies chest pain, does feel SOB.

## 2019-08-10 NOTE — Discharge Instructions (Addendum)
Please seek medical attention for any high fevers, chest pain, shortness of breath, change in behavior, persistent vomiting, bloody stool or any other new or concerning symptoms.  

## 2019-08-10 NOTE — ED Provider Notes (Signed)
[facility] Emergency Department Provider Note   ____________________________________________   I have reviewed the triage vital signs and the nursing notes.   HISTORY  Chief Complaint Nausea  History limited by: Not Limited   HPI Renee Bush is a 58 y.o. female who presents to the emergency department today because of concern for nausea.  The patient states that the nausea has been present for the past week.  Will come and go in waves.  It is triggered when she does try to eat.  She has only been able to tolerate small bites.  The patient did have 1 day where she had some associated vomiting.  She has decreased bowel movements but denies any diarrhea or black or tarry stool.  She denies any fevers.  She did notice some bad odor to her urine recently.  Denies any known sick contacts.  States she has a history of appendectomy and cesarean section. She has noticed associated weakness recently as well as some difficulty catching her breath.    Records reviewed. Per medical record review patient has a history of DM, HLD, HTN, CKD.   Past Medical History:  Diagnosis Date  . Chronic kidney disease, stage III (moderate) 10/06/2017  . Diabetes mellitus without complication ([place])   . GERD (gastroesophageal reflux disease)   . Hypercholesteremia   . Hypertension 09/10/2015    Patient Active Problem List   Diagnosis Date Noted  . History of allergy 06/11/2019  . Family history of colonic polyps   . Bilateral edema of lower extremity 05/16/2019  . Generalized anxiety disorder 02/07/2019  . Anemia 07/05/2018  . Abdominal pain 11/09/2017  . Nausea 11/09/2017  . Insomnia 10/06/2017  . GERD (gastroesophageal reflux disease) 10/06/2017  . Chronic kidney disease, stage III (moderate) 10/06/2017  . B12 deficiency 06/09/2016  . Hypercholesteremia 05/05/2016  . Type 2 diabetes mellitus with diabetic neuropathy, with long-term current use of insulin ([place]) 09/10/2015   . Hypertension 09/10/2015  . ETOH abuse 04/18/2015  . Fatigue 04/18/2015    Past Surgical History:  Procedure Laterality Date  . APPENDECTOMY    . CESAREAN SECTION  1991  . COLONOSCOPY WITH PROPOFOL N/A 06/03/2019   Procedure: COLONOSCOPY WITH PROPOFOL;  Surgeon: Jonathon Bellows, MD;  Location: [facility] ENDOSCOPY;  Service: Gastroenterology;  Laterality: N/A;    Prior to Admission medications   Medication Sig Start Date End Date Taking? Authorizing Provider  aspirin EC 81 MG tablet Take 1 tablet (81 mg total) by mouth daily. 10/05/17   Lada, Satira Anis, MD  Dulaglutide (TRULICITY) 0.62 IR/4.8NI SOPN Inject 0.75 mg into the skin once a week. 02/07/19   Doles-Johnson, Teah, NP  DULoxetine (CYMBALTA) 60 MG capsule Take 1 capsule (60 mg total) by mouth daily. 07/19/19   Zara Council A, PA-C  ferrous sulfate 325 (65 FE) MG tablet TAKE ONE TABLET BY MOUTH EVERY DAY WITH BREAKFAST. 07/11/19   Iloabachie, Chioma E, NP  fexofenadine (ALLEGRA ODT) 30 MG disintegrating tablet Take 1 tablet (30 mg total) by mouth daily. 06/11/19   Iloabachie, Chioma E, NP  furosemide (LASIX) 20 MG tablet Take 1 tablet (20 mg total) by mouth every other day. Patient states that she takes every other day 06/11/19   Iloabachie, Chioma E, NP  gabapentin (NEURONTIN) 400 MG capsule TAKE ONE CAPSULE BY MOUTH 2 TIMES A DAY AND 2 CAPSULES AT BEDTIME 06/11/19   Iloabachie, Chioma E, NP  insulin glargine (LANTUS) 100 UNIT/ML injection Inject 0.1 mLs (10 Units total) into  the skin daily. 06/11/19   Iloabachie, Chioma E, NP  lisinopril (ZESTRIL) 10 MG tablet Take 1 tablet (10 mg total) by mouth daily. 01/10/19   Doles-Johnson, Teah, NP  metFORMIN (GLUCOPHAGE) 1000 MG tablet Take 0.5 tablets (500 mg total) by mouth 2 (two) times daily with a meal. 05/02/19   Iloabachie, Chioma E, NP  Metoprolol Tartrate 75 MG TABS Take 75 mg by mouth 2 (two) times daily. 01/10/19   Doles-Johnson, Teah, NP  nitroGLYCERIN (NITROSTAT) 0.4 MG SL tablet 1 TABLET  UNDER TONGUE AS NEEDED FOR CHEST PAIN EVERY 5 MINUTES FOR MAX OF 3 DOSES IN 15 MINUTES; IF NO RELIEF AFTER 1ST DOSE CALL 911 04/15/19   Doles-Johnson, Teah, NP  Omega-3 Fatty Acids (FISH OIL) 1000 MG CAPS Take 1,000 mg by mouth 2 (two) times daily.    [provider]  omeprazole (PRILOSEC) 20 MG capsule TAKE ONE CAPSULE BY MOUTH 2 TIMES ADAY BEFORE A MEAL 05/02/19   Iloabachie, Chioma E, NP  rosuvastatin (CRESTOR) 10 MG tablet TAKE 1/2 TABLET (5 mg) BY MOUTH EVERY DAY 07/11/19   Iloabachie, Chioma E, NP    Allergies Etodolac, Penicillin g, and Penicillins  Family History  Problem Relation Age of Onset  . COPD Mother   . Alzheimer's disease Mother   . Hypertension Father   . Hyperlipidemia Father   . Heart attack Father   . Diabetes type II Father   . Diabetes type II Sister   . Diabetes type II Sister   . Gestational diabetes Daughter   . Diabetes type II Sister     Social History Social History   Tobacco Use  . Smoking status: Former Smoker    Types: Cigarettes    Quit date: 04/17/2005    Years since quitting: 14.3  . Smokeless tobacco: Never Used  Substance Use Topics  . Alcohol use: No    Alcohol/week: 14.0 standard drinks    Types: 14 Cans of beer per week    Comment: quit ~3440mo ago  . Drug use: No    Review of Systems Constitutional: No fever/chills Eyes: No visual changes. ENT: No sore throat. Cardiovascular: Denies chest pain. Respiratory: Denies shortness of breath. Gastrointestinal: Positive for nausea and one day of vomiting.   Genitourinary: Negative for dysuria. Positive for bad odor to her urine.  Musculoskeletal: Negative for back pain. Skin: Negative for rash. Neurological: Negative for headaches, focal weakness or numbness.  ____________________________________________   PHYSICAL EXAM:  VITAL SIGNS: ED Triage Vitals  Enc Vitals Group     BP 08/10/19 1455 (!) 107/93     Pulse Rate 08/10/19 1455 (!) 160     Resp 08/10/19 1455 20      Temp 08/10/19 1455 98.5 F (36.9 C)     Temp Source 08/10/19 1455 Oral     SpO2 08/10/19 1455 100 %     Weight 08/10/19 1456 180 lb (81.6 kg)     Height 08/10/19 1456 5\' 2"  (1.575 m)     Head Circumference --      Peak Flow --      Pain Score 08/10/19 1455 0   Constitutional: Alert and oriented.  Eyes: Conjunctivae are normal.  ENT      Head: Normocephalic and atraumatic.      Nose: No congestion/rhinnorhea.      Mouth/Throat: Mucous membranes are moist.      Neck: No stridor. Hematological/Lymphatic/Immunilogical: No cervical lymphadenopathy. Cardiovascular: Tachycardic, regular rhythm.  No murmurs, rubs, or gallops.  Respiratory:  Normal respiratory effort without tachypnea nor retractions. Breath sounds are clear and equal bilaterally. No wheezes/rales/rhonchi. Gastrointestinal: Soft and mildly tender to palpation in the right upper quadrant.  Genitourinary: Deferred Musculoskeletal: Normal range of motion in all extremities. No lower extremity edema. Neurologic:  Normal speech and language. No gross focal neurologic deficits are appreciated.  Skin:  Skin is warm, dry and intact. No rash noted. Psychiatric: Mood and affect are normal. Speech and behavior are normal. Patient exhibits appropriate insight and judgment.  ____________________________________________    LABS (pertinent positives/negatives)  CBC wbc 7.1, hgb 14.9, plt 248 Trop hs 22 BMP na 136, k 4.0, glu 303, cr 1.03 ____________________________________________   EKG  I, Phineas Semen, attending physician, personally viewed and interpreted this EKG  EKG Time: 1451 Rate: 155 Rhythm: sinus tachycardia Axis: normal Intervals: qtc 494 QRS: narrow ST changes: no st elevation Impression: abnormal ekg  I, Phineas Semen, attending physician, personally viewed and interpreted this EKG  EKG Time: 1952 Rate: 94 Rhythm: sinus rhythm Axis: normal Intervals: qtc 463 QRS: narrow, q waves III, v1 ST  changes: no st elevation Impression: abnormal ekg  ____________________________________________    RADIOLOGY  CXR No acute abnormality  ____________________________________________   PROCEDURES  Procedures  ____________________________________________   INITIAL IMPRESSION / ASSESSMENT AND PLAN / ED COURSE  Pertinent labs & imaging results that were available during my care of the patient were reviewed by me and considered in my medical decision making (see chart for details).   Patient presented to the emergency department today because of concern for nausea. On exam patient was initially found to be significantly tachycardic. Did have concern for dehydration given nausea and tachycardia. Did start IVFs. Blood work without concerning leukocytosis. Given that she did have some RUQ tenderness [place] was performed, this did not show any gallstones. Heart rate did improve after IVFs. Will check for COVID. Will give patient prescription for nausea medication. Did discuss return precautions.    ____________________________________________   FINAL CLINICAL IMPRESSION(S) / ED DIAGNOSES  Final diagnoses:  Nausea and vomiting  Tachycardia     Note: This dictation was prepared with Dragon dictation. Any transcriptional errors that result from this process are unintentional     Phineas Semen, MD 08/10/19 2036

## 2019-08-12 ENCOUNTER — Other Ambulatory Visit: Payer: Self-pay | Admitting: Gerontology

## 2019-08-12 DIAGNOSIS — I10 Essential (primary) hypertension: Secondary | ICD-10-CM

## 2019-08-12 LAB — NOVEL CORONAVIRUS, NAA (HOSP ORDER, SEND-OUT TO REF LAB; TAT 18-24 HRS): SARS-CoV-2, NAA: NOT DETECTED

## 2019-08-14 ENCOUNTER — Ambulatory Visit: Payer: Self-pay | Admitting: Gerontology

## 2019-08-15 ENCOUNTER — Encounter: Payer: Self-pay | Admitting: Gerontology

## 2019-08-15 ENCOUNTER — Ambulatory Visit: Payer: Self-pay | Admitting: Gerontology

## 2019-08-15 ENCOUNTER — Other Ambulatory Visit: Payer: Self-pay

## 2019-08-15 VITALS — BP 123/82 | HR 98 | Temp 97.2°F | Ht 62.0 in | Wt 179.0 lb

## 2019-08-15 DIAGNOSIS — I10 Essential (primary) hypertension: Secondary | ICD-10-CM

## 2019-08-15 DIAGNOSIS — J0140 Acute pansinusitis, unspecified: Secondary | ICD-10-CM | POA: Insufficient documentation

## 2019-08-15 DIAGNOSIS — K219 Gastro-esophageal reflux disease without esophagitis: Secondary | ICD-10-CM

## 2019-08-15 DIAGNOSIS — R110 Nausea: Secondary | ICD-10-CM

## 2019-08-15 DIAGNOSIS — Z794 Long term (current) use of insulin: Secondary | ICD-10-CM

## 2019-08-15 DIAGNOSIS — E538 Deficiency of other specified B group vitamins: Secondary | ICD-10-CM

## 2019-08-15 DIAGNOSIS — E7800 Pure hypercholesterolemia, unspecified: Secondary | ICD-10-CM

## 2019-08-15 DIAGNOSIS — E1140 Type 2 diabetes mellitus with diabetic neuropathy, unspecified: Secondary | ICD-10-CM

## 2019-08-15 DIAGNOSIS — J0110 Acute frontal sinusitis, unspecified: Secondary | ICD-10-CM

## 2019-08-15 DIAGNOSIS — J019 Acute sinusitis, unspecified: Secondary | ICD-10-CM | POA: Insufficient documentation

## 2019-08-15 LAB — POCT GLYCOSYLATED HEMOGLOBIN (HGB A1C): Hemoglobin A1C: 7.2 % — AB (ref 4.0–5.6)

## 2019-08-15 MED ORDER — LISINOPRIL 10 MG PO TABS: 10.0000 mg | ORAL_TABLET | Freq: Every day | ORAL | 0 refills | Status: DC

## 2019-08-15 MED ORDER — ROSUVASTATIN CALCIUM 10 MG PO TABS: ORAL_TABLET | ORAL | 2 refills | Status: DC

## 2019-08-15 MED ORDER — GABAPENTIN 400 MG PO CAPS: ORAL_CAPSULE | ORAL | 0 refills | Status: DC

## 2019-08-15 MED ORDER — CYANOCOBALAMIN 1000 MCG/ML IJ SOLN: 1000.0000 ug | Freq: Once | INTRAMUSCULAR | Status: DC

## 2019-08-15 MED ORDER — OMEPRAZOLE 20 MG PO CPDR: DELAYED_RELEASE_CAPSULE | ORAL | 1 refills | Status: DC

## 2019-08-15 MED ORDER — INSULIN GLARGINE 100 UNIT/ML ~~LOC~~ SOLN: 14.0000 [IU] | Freq: Every day | SUBCUTANEOUS | 1 refills | Status: DC

## 2019-08-15 MED ORDER — METFORMIN HCL 1000 MG PO TABS: 500.0000 mg | ORAL_TABLET | Freq: Two times a day (BID) | ORAL | 1 refills | Status: DC

## 2019-08-15 NOTE — Progress Notes (Signed)
poct  Established Patient Office Visit  Subjective:  Patient ID: Renee Bush, female    DOB: 1961-08-16  Age: 58 y.o. MRN: [id number]  CC:  Chief Complaint  Patient presents with  . Diabetes    HPI Renee GuadeloupeCatherine C Demir presents for follow-up of type 2 diabetes, hypertension , hyperlipidemia and medication refill.  She states that she is compliant with her medications, but has not been checking her blood glucose.  Her hemoglobin A1c was done during her clinic visit and it was 7.2%, and blood glucose was 147 mg per DL.  She denies hypoglycemic symptoms and performs daily foot checks.  She states that she was seen at the ED on 08/10/2019, was treated for nausea, vomiting and tachycardia. She states that she has improved 60%, but continues to experience intermittent nausea but no vomiting. She states that she still experiences decreased appetite, but she eats small portion sized meals and has increased her fluid intake. Currently she c/o intermittent sharp frontal sinus headache that radiates to her nose which has being going on for 4 days ago. She denies any aggravating factor, states that it increases in intensity from 2-7, but resolves within one minute. She states that she experiences 2-3 episodes of the sinus headache daily and its associated with rhinorrhea and blurry vision. She tested negative for Covid 19, denies nasal congestion, sore throat, loss of sense of smell or taste, fever, chills, chest pain, shortness of breath, palpitation, and she offers no further complaint.  Past Medical History:  Diagnosis Date  . Chronic kidney disease, stage III (moderate) 10/06/2017  . Diabetes mellitus without complication (HCC)   . GERD (gastroesophageal reflux disease)   . Hypercholesteremia   . Hypertension 09/10/2015    Past Surgical History:  Procedure Laterality Date  . APPENDECTOMY    . CESAREAN SECTION  1991  . COLONOSCOPY WITH PROPOFOL N/A 06/03/2019   Procedure: COLONOSCOPY WITH  PROPOFOL;  Surgeon: Wyline MoodAnna, Kiran, MD;  Location: [facility] ENDOSCOPY;  Service: Gastroenterology;  Laterality: N/A;    Family History  Problem Relation Age of Onset  . COPD Mother   . Alzheimer's disease Mother   . Hypertension Father   . Hyperlipidemia Father   . Heart attack Father   . Diabetes type II Father   . Diabetes type II Sister   . Diabetes type II Sister   . Gestational diabetes Daughter   . Diabetes type II Sister     Social History   Socioeconomic History  . Marital status: Married    Spouse name: Not on file  . Number of children: 4  . Years of education: Not on file  . Highest education level: 11th grade  Occupational History  . Occupation: na  Tobacco Use  . Smoking status: Former Smoker    Types: Cigarettes    Quit date: 04/17/2005    Years since quitting: 14.3  . Smokeless tobacco: Never Used  Substance and Sexual Activity  . Alcohol use: No    Alcohol/week: 14.0 standard drinks    Types: 14 Cans of beer per week    Comment: quit ~6737mo ago  . Drug use: No  . Sexual activity: Not on file  Other Topics Concern  . Not on file  Social History Narrative   Lives in mobile home paid for   Social Determinants of Health   Financial Resource Strain:   . Difficulty of Paying Living Expenses: Not on file  Food Insecurity: Food Insecurity Present  . Worried About Running  Out of Food in the Last Year: Sometimes true  . Ran Out of Food in the Last Year: Sometimes true  Transportation Needs:   . Lack of Transportation (Medical): Not on file  . Lack of Transportation (Non-Medical): Not on file  Physical Activity:   . Days of Exercise per Week: Not on file  . Minutes of Exercise per Session: Not on file  Stress:   . Feeling of Stress : Not on file  Social Connections:   . Frequency of Communication with Friends and Family: Not on file  . Frequency of Social Gatherings with Friends and Family: Not on file  . Attends Religious Services: Not on file  . Active  Member of Clubs or Organizations: Not on file  . Attends Archivist Meetings: Not on file  . Marital Status: Not on file  Intimate Partner Violence:   . Fear of Current or Ex-Partner: Not on file  . Emotionally Abused: Not on file  . Physically Abused: Not on file  . Sexually Abused: Not on file    Outpatient Medications Prior to Visit  Medication Sig Dispense Refill  . aspirin EC 81 MG tablet Take 1 tablet (81 mg total) by mouth daily.    . Dulaglutide (TRULICITY) 3.26 ZT/2.4PY SOPN Inject 0.75 mg into the skin once a week. 4 pen 4  . DULoxetine (CYMBALTA) 60 MG capsule Take 1 capsule (60 mg total) by mouth daily. 30 capsule 0  . ferrous sulfate 325 (65 FE) MG tablet TAKE ONE TABLET BY MOUTH EVERY DAY WITH BREAKFAST. 30 tablet 0  . fexofenadine (ALLEGRA ODT) 30 MG disintegrating tablet Take 1 tablet (30 mg total) by mouth daily. 30 tablet 1  . Metoprolol Tartrate 75 MG TABS Take 75 mg by mouth 2 (two) times daily. 180 tablet 1  . nitroGLYCERIN (NITROSTAT) 0.4 MG SL tablet 1 TABLET UNDER TONGUE AS NEEDED FOR CHEST PAIN EVERY 5 MINUTES FOR MAX OF 3 DOSES IN 15 MINUTES; IF NO RELIEF AFTER 1ST DOSE CALL 911 25 tablet 0  . ondansetron (ZOFRAN) 4 MG tablet Take 1 tablet (4 mg total) by mouth every 8 (eight) hours as needed. 20 tablet 0  . furosemide (LASIX) 20 MG tablet Take 1 tablet (20 mg total) by mouth every other day. Patient states that she takes every other day 90 tablet 0  . gabapentin (NEURONTIN) 400 MG capsule TAKE ONE CAPSULE BY MOUTH 2 TIMES A DAY AND 2 CAPSULES AT BEDTIME 120 capsule 0  . insulin glargine (LANTUS) 100 UNIT/ML injection Inject 0.1 mLs (10 Units total) into the skin daily. 10 mL 1  . lisinopril (ZESTRIL) 10 MG tablet TAKE ONE TABLET BY MOUTH EVERY DAY 90 tablet 0  . metFORMIN (GLUCOPHAGE) 1000 MG tablet Take 0.5 tablets (500 mg total) by mouth 2 (two) times daily with a meal. 60 tablet 1  . omeprazole (PRILOSEC) 20 MG capsule TAKE ONE CAPSULE BY MOUTH 2 TIMES  ADAY BEFORE A MEAL 120 capsule 1  . rosuvastatin (CRESTOR) 10 MG tablet TAKE 1/2 TABLET (5 mg) BY MOUTH EVERY DAY 45 tablet 0  . Omega-3 Fatty Acids (FISH OIL) 1000 MG CAPS Take 1,000 mg by mouth 2 (two) times daily.     Facility-Administered Medications Prior to Visit  Medication Dose Route Frequency Provider Last Rate Last Admin  . cyanocobalamin ((VITAMIN B-12)) injection 1,000 mcg  1,000 mcg Intramuscular Once Sherly Brodbeck E, NP        Allergies  Allergen Reactions  . Etodolac  Rash  . Penicillin G Rash  . Penicillins Rash    Has patient had a PCN reaction causing immediate rash, facial/tongue/throat swelling, SOB or lightheadedness with hypotension: Yes Has patient had a PCN reaction causing severe rash involving mucus membranes or skin necrosis: No Has patient had a PCN reaction that required hospitalization: No Has patient had a PCN reaction occurring within the last 10 years: No If all of the above answers are "NO", then may proceed with Cephalosporin use.    ROS Review of Systems  Constitutional: Positive for appetite change.  HENT: Positive for sinus pain.   Respiratory: Negative.   Cardiovascular: Negative.   Gastrointestinal: Positive for nausea (intermittent nausea ).  Neurological: Positive for headaches (frontal headache).  Psychiatric/Behavioral: Negative.       Objective:    Physical Exam  Constitutional: She is oriented to person, place, and time. She appears well-developed.  HENT:  Head: Normocephalic and atraumatic.  Nose: Right sinus exhibits maxillary sinus tenderness and frontal sinus tenderness. Left sinus exhibits maxillary sinus tenderness and frontal sinus tenderness.  Cardiovascular: Normal rate and regular rhythm.  Pulmonary/Chest: Effort normal and breath sounds normal.  Neurological: She is alert and oriented to person, place, and time.  Skin: Skin is warm and dry.  Psychiatric: She has a normal mood and affect. Her behavior is normal.  Judgment and thought content normal.    BP 123/82 (BP Location: Left Arm, Patient Position: Sitting)   Pulse 98   Temp (!) 97.2 F (36.2 C)   Ht 5\' 2"  (1.575 m)   Wt 179 lb (81.2 kg)   SpO2 100%   BMI 32.74 kg/m  Wt Readings from Last 3 Encounters:  08/15/19 179 lb (81.2 kg)  08/10/19 180 lb (81.6 kg)  06/11/19 195 lb (88.5 kg)   She lost 16 pounds in 8 weeks, she was encouraged to continue on her weight loss regimen.  Health Maintenance Due  Topic Date Due  . Hepatitis C Screening  10-21-1959  . HIV Screening  07/23/1975  . PAP SMEAR-Modifier  07/22/1981  . MAMMOGRAM  07/22/2010    There are no preventive care reminders to display for this patient.  Lab Results  Component Value Date   TSH 0.763 11/09/2017   Lab Results  Component Value Date   WBC 7.1 08/10/2019   HGB 14.9 08/10/2019   HCT 43.1 08/10/2019   MCV 87.6 08/10/2019   PLT 248 08/10/2019   Lab Results  Component Value Date   NA 136 08/10/2019   K 4.0 08/10/2019   CO2 20 (L) 08/10/2019   GLUCOSE 303 (H) 08/10/2019   BUN 24 (H) 08/10/2019   CREATININE 1.03 (H) 08/10/2019   BILITOT 0.3 06/05/2019   ALKPHOS 60 06/05/2019   AST 16 06/05/2019   ALT 14 06/05/2019   PROT 6.0 06/05/2019   ALBUMIN 3.8 06/05/2019   CALCIUM 9.3 08/10/2019   ANIONGAP 12 08/10/2019   Lab Results  Component Value Date   CHOL 133 01/17/2019   Lab Results  Component Value Date   HDL 43 01/17/2019   Lab Results  Component Value Date   LDLCALC 68 01/17/2019   Lab Results  Component Value Date   TRIG 112 01/17/2019   Lab Results  Component Value Date   CHOLHDL 3.1 01/17/2019   Lab Results  Component Value Date   HGBA1C 7.2 (A) 08/15/2019      Assessment & Plan:     1. Type 2 diabetes mellitus with diabetic neuropathy, with  long-term current use of insulin (HCC) - Her HgbA1c was 7.2%, her goal is less than 7%. Her Lantus was increased to 14 units and she will continue on Metformin 500 mg bid. -Use  Diabetic diet as advised  Check blood sugar 2 times a day, once before breakfast and others 2 hours after lunch or dinner -Write down the numbers against date in a logBring log to clinic every visit -Take medications regularly as advised -Regular exercise as tolerated. - metFORMIN (GLUCOPHAGE) 1000 MG tablet; Take 0.5 tablets (500 mg total) by mouth 2 (two) times daily with a meal.  Dispense: 60 tablet; Refill: 1 - gabapentin (NEURONTIN) 400 MG capsule; TAKE ONE CAPSULE BY MOUTH 2 TIMES A DAY AND 2 CAPSULES AT BEDTIME  Dispense: 120 capsule; Refill: 0 - POCT HgB A1C - insulin glargine (LANTUS) 100 UNIT/ML injection; Inject 0.14 mLs (14 Units total) into the skin daily.  Dispense: 10 mL; Refill: 1  2. Hypertension, unspecified type - Her Furosemide was discontinued due to likely dehydration and she denies lower leg edema. She was advised to notify clinic with reoccurrence of lower extremity edema.Her blood pressure is under control and she will continue on current treatment regimen. -Low salt DASH diet -Take medications regularly on time -Exercise regularly as tolerated -Check blood pressure at least once a week at home or a nearby pharmacy and record -Goal is less than 140/90 and normal blood pressure is less than120/80 - lisinopril (ZESTRIL) 10 MG tablet; Take 1 tablet (10 mg total) by mouth daily.  Dispense: 90 tablet; Refill: 0 - Basic Metabolic Panel (BMET); Future  3. Gastroesophageal reflux disease without esophagitis - Her acid reflux is under control and she will continue on current treatment regimen. -Avoid spicy, fatty and fried food -Avoid sodas and sour juices -Avoid heavy meals -Avoid eating 4 hours before bedtime -Elevate head of bed at night - omeprazole (PRILOSEC) 20 MG capsule; TAKE ONE CAPSULE BY MOUTH 2 TIMES ADAY BEFORE A MEAL  Dispense: 120 capsule; Refill: 1  4. Hypercholesteremia -She will continue on current treatment regimen and was advised to continue on  -Low  fat Diet, like low fat dairy products eg skimmed milk -Avoid any fried food -Regular exercise/walk -Goal for Total Cholesterol is less than 200 -Goal for bad cholesterol LDL is less than 100 -Goal for Good cholesterol HDL is more than 45 -Goal for Triglyceride is less than 150 - rosuvastatin (CRESTOR) 10 MG tablet; TAKE 1/2 TABLET (5 mg) BY MOUTH EVERY DAY  Dispense: 30 tablet; Refill: 2 - Lipid panel; Future  5. Nausea - She was advised to continue on Zofran as needed and increase fluid intake, and to go to the ED for worsening symptoms.   6. Acute frontal sinusitis, recurrence not specified - Sinusitis is likely viral in origin, she was advised to increase fluid intake, get adequate rest, apply warm moist washcloth on her face 3-4 times daily Take Tylenol 650 mg every 8-12 hours as needed for pain.  7. B12 deficiency - cyanocobalamin ((VITAMIN B-12)) injection 1,000 mcg  Was administered during clinic visit.   Follow-up: Return in about 20 days (around 09/04/2019), or if symptoms worsen or fail to improve.    Hashir Deleeuw Trellis Paganini, NP

## 2019-08-15 NOTE — Patient Instructions (Signed)

## 2019-08-19 ENCOUNTER — Other Ambulatory Visit: Payer: Self-pay

## 2019-08-26 ENCOUNTER — Ambulatory Visit: Payer: Self-pay

## 2019-08-26 ENCOUNTER — Other Ambulatory Visit: Payer: Self-pay

## 2019-08-26 DIAGNOSIS — Z79899 Other long term (current) drug therapy: Secondary | ICD-10-CM

## 2019-08-26 NOTE — Progress Notes (Signed)
Medication Management Clinic Visit Note  Patient: Renee Bush MRN: [id number] Date of Birth: 1961/04/14 PCP: Rolm Gala, NP   [name] 58 y.o. female presents for a telephone visit for medication management with the pharmacist today.  There were no vitals taken for this visit.  Patient Information   Past Medical History:  Diagnosis Date  . Chronic kidney disease, stage III (moderate) 10/06/2017  . Diabetes mellitus without complication (HCC)   . GERD (gastroesophageal reflux disease)   . Hypercholesteremia   . Hypertension 09/10/2015      Past Surgical History:  Procedure Laterality Date  . APPENDECTOMY    . CESAREAN SECTION  1991  . COLONOSCOPY WITH PROPOFOL N/A 06/03/2019   Procedure: COLONOSCOPY WITH PROPOFOL;  Surgeon: Wyline Mood, MD;  Location: [facility] ENDOSCOPY;  Service: Gastroenterology;  Laterality: N/A;     Family History  Problem Relation Age of Onset  . COPD Mother   . Alzheimer's disease Mother   . Hypertension Father   . Hyperlipidemia Father   . Heart attack Father   . Diabetes type II Father   . Diabetes type II Sister   . Diabetes type II Sister   . Gestational diabetes Daughter   . Diabetes type II Sister     New Diagnoses (since last visit): None  Family Support: Good. Lives with husband.   Lifestyle Diet: Breakfast: [employer] mini-biscuits with hashbrowns Lunch: Cup of macaroni and cheese Dinner: Taco salad Drinks: Un-sweet tea, water, diet soda occasionally    Social History   Substance and Sexual Activity  Alcohol Use No  . Alcohol/week: 14.0 standard drinks  . Types: 14 Cans of beer per week   Comment: quit ~71mo ago   No alcohol in a couple of years    Social History   Tobacco Use  Smoking Status Former Smoker  . Types: Cigarettes  . Quit date: 04/17/2005  . Years since quitting: 14.3  Smokeless Tobacco Never Used   Still abstinent from smoking   Health Maintenance  Topic Date Due  .  Hepatitis C Screening  [birth year]  . HIV Screening  07/23/1975  . PAP SMEAR-Modifier  07/22/1981  . MAMMOGRAM  07/22/2010  . OPHTHALMOLOGY EXAM  09/07/2019  . HEMOGLOBIN A1C  02/13/2020  . FOOT EXAM  05/15/2020  . TETANUS/TDAP  06/03/2025  . COLONOSCOPY  06/02/2029  . INFLUENZA VACCINE  Completed  . PNEUMOCOCCAL POLYSACCHARIDE VACCINE AGE 66-64 HIGH RISK  Completed   Outpatient Encounter Medications as of 08/26/2019  Medication Sig  . aspirin EC 81 MG tablet Take 1 tablet (81 mg total) by mouth daily.  . Dulaglutide (TRULICITY) 0.75 MG/0.5ML SOPN Inject 0.75 mg into the skin once a week.  . DULoxetine (CYMBALTA) 60 MG capsule Take 1 capsule (60 mg total) by mouth daily.  . ferrous sulfate 325 (65 FE) MG tablet TAKE ONE TABLET BY MOUTH EVERY DAY WITH BREAKFAST.  . fexofenadine (ALLEGRA ODT) 30 MG disintegrating tablet Take 1 tablet (30 mg total) by mouth daily.  Marland Kitchen gabapentin (NEURONTIN) 400 MG capsule TAKE ONE CAPSULE BY MOUTH 2 TIMES A DAY AND 2 CAPSULES AT BEDTIME  . insulin glargine (LANTUS) 100 UNIT/ML injection Inject 0.14 mLs (14 Units total) into the skin daily.  Marland Kitchen lisinopril (ZESTRIL) 10 MG tablet Take 1 tablet (10 mg total) by mouth daily.  . metFORMIN (GLUCOPHAGE) 1000 MG tablet Take 0.5 tablets (500 mg total) by mouth 2 (two) times daily with a meal.  . Metoprolol Tartrate 75 MG TABS  Take 75 mg by mouth 2 (two) times daily.  . nitroGLYCERIN (NITROSTAT) 0.4 MG SL tablet 1 TABLET UNDER TONGUE AS NEEDED FOR CHEST PAIN EVERY 5 MINUTES FOR MAX OF 3 DOSES IN 15 MINUTES; IF NO RELIEF AFTER 1ST DOSE CALL 911  . Omega-3 Fatty Acids (FISH OIL) 1000 MG CAPS Take 1,000 mg by mouth 2 (two) times daily.  Marland Kitchen omeprazole (PRILOSEC) 20 MG capsule TAKE ONE CAPSULE BY MOUTH 2 TIMES ADAY BEFORE A MEAL  . ondansetron (ZOFRAN) 4 MG tablet Take 1 tablet (4 mg total) by mouth every 8 (eight) hours as needed.  . rosuvastatin (CRESTOR) 10 MG tablet TAKE 1/2 TABLET (5 mg) BY MOUTH EVERY DAY    Facility-Administered Encounter Medications as of 08/26/2019  Medication  . cyanocobalamin ((VITAMIN B-12)) injection 1,000 mcg  . cyanocobalamin ((VITAMIN B-12)) injection 1,000 mcg    Health Maintenance/Date Completed  Last ED visit: 08/10/2019 (NV/dehydration) Last Visit to PCP: 08/15/2019 Next Visit to PCP: 09/04/2019 Specialist Visit: Follows with [place] Exam: None recent  Eye Exam: Wears glasses. Last eye exam was in 08/2018 per patient.  Pelvic/PAP Exam: None recently  Mammogram: "it's been a long time" DEXA: Never Colonoscopy: 06/03/2019 Flu Vaccine: Patient endorses receiving this year Pneumonia Vaccine: PPSV23 05/16/2019  Assessment and Plan:   1. Type 2 diabetes mellitus with diabetic neuropathy, with long-term current use of insulin - 08/15/2019 HgbA1c was 7.2%, her goal is less than 7%. - Antidiabetic regimen includes insulin glargine 14 units, dulaglutide 0.75 mg weekly, and metformin 500 mg twice daily with meals -Patient endorses checking her blood sugar twice daily. She checks a fasting blood glucose in the morning and a post-prandial blood glucose in the evenings. Reports AM blood sugars run 90-low 100s and PM blood sugars 140-150s. -Denies polyphagia, polyuria, polydipsia. Denies signs/symptoms of low blood sugar. -Patient does not currently participate in a regular exercise regimen. She does walk her dogs daily to use the bathroom. Encouraged patient to increase exercise by walking her dogs for 30 minutes a couple of times per week. -Advised patient to avoid foods high in sugar -Patient takes gabapentin and duloxetine for diabetic neuropathy and she reports symptoms are controlled. -She is on a moderate intensity statin, aspirin, and ACE-I  2. Hypertension, unspecified type - Furosemide was discontinued due to dehydration -Current antihypertensive regimen includes lisinopril 10 mg daily -She checks her blood pressure 2-3x/week.  Patient reports last blood pressure was 128/78.  3. Gastroesophageal reflux disease without esophagitis - Her acid reflux is under control on omeprazole. She endorses taking omeprazole for quite some time.  -Encouraged patient to discuss tapering off of acid suppressant therapy if willing and no symptoms  4. Hypercholesteremia -Patient is on a moderate intensity statin, rosuvastatin 10 mg daily. Denies myalgias. Last lipid panel 01/17/2019 and LDL was 68.   5. B12 deficiency - B12 injections - Metformin and omeprazole could contribute  6). Medication adherence -Patient utilizes a pill box and reports good adherence to medication regimen -Not requiring refills on any medications at this time  7). Adverse effects -Patient denies any side effects of current medications  [place] Resident 26 August 2019

## 2019-08-28 ENCOUNTER — Other Ambulatory Visit: Payer: Self-pay

## 2019-08-28 DIAGNOSIS — I10 Essential (primary) hypertension: Secondary | ICD-10-CM

## 2019-08-28 DIAGNOSIS — E7800 Pure hypercholesterolemia, unspecified: Secondary | ICD-10-CM

## 2019-08-29 LAB — LIPID PANEL
Chol/HDL Ratio: 3 ratio (ref 0.0–4.4)
Cholesterol, Total: 125 mg/dL (ref 100–199)
HDL: 42 mg/dL (ref >39)
LDL Chol Calc (NIH): 61 mg/dL (ref 0–99)
Triglycerides: 125 mg/dL (ref 0–149)
VLDL Cholesterol Cal: 22 mg/dL (ref 5–40)

## 2019-08-29 LAB — BASIC METABOLIC PANEL
BUN/Creatinine Ratio: 14 (ref 9–23)
BUN: 16 mg/dL (ref 6–24)
CO2: 27 mmol/L (ref 20–29)
Calcium: 8.9 mg/dL (ref 8.7–10.2)
Chloride: 102 mmol/L (ref 96–106)
Creatinine, Ser: 1.14 mg/dL — ABNORMAL HIGH (ref 0.57–1.00)
GFR calc Af Amer: 61 mL/min/{1.73_m2} (ref >59)
GFR calc non Af Amer: 53 mL/min/{1.73_m2} — ABNORMAL LOW (ref >59)
Glucose: 128 mg/dL — ABNORMAL HIGH (ref 65–99)
Potassium: 4.4 mmol/L (ref 3.5–5.2)
Sodium: 141 mmol/L (ref 134–144)

## 2019-09-04 ENCOUNTER — Other Ambulatory Visit: Payer: Self-pay

## 2019-09-04 ENCOUNTER — Ambulatory Visit: Payer: Self-pay | Admitting: Gerontology

## 2019-09-04 VITALS — BP 134/85 | HR 80 | Ht 62.0 in | Wt 189.0 lb

## 2019-09-04 DIAGNOSIS — Z889 Allergy status to unspecified drugs, medicaments and biological substances status: Secondary | ICD-10-CM

## 2019-09-04 DIAGNOSIS — R899 Unspecified abnormal finding in specimens from other organs, systems and tissues: Secondary | ICD-10-CM | POA: Insufficient documentation

## 2019-09-04 DIAGNOSIS — J0110 Acute frontal sinusitis, unspecified: Secondary | ICD-10-CM

## 2019-09-04 MED ORDER — SALINE SPRAY 0.65 % NA SOLN: 1.0000 | NASAL | 0 refills | Status: DC | PRN

## 2019-09-04 MED ORDER — FLUTICASONE PROPIONATE 50 MCG/ACT NA SUSP: 1.0000 | Freq: Every day | NASAL | 0 refills | Status: DC

## 2019-09-04 MED ORDER — MONTELUKAST SODIUM 10 MG PO TABS: 10.0000 mg | ORAL_TABLET | Freq: Every day | ORAL | 0 refills | Status: DC

## 2019-09-04 NOTE — Progress Notes (Signed)
Established Patient Office Visit  Subjective:  Patient ID: Renee Bush, female    DOB: November 23, 1960  Age: 59 y.o. MRN: [id number]  CC:  Chief Complaint  Patient presents with  . Nausea    HPI Renee Bush presents for follow up of Nausea , acute frontal sinusitis and lab review. She states that the nausea has resolved. She states that her frontal and maxillary sinus pressure has 50% improvement, but she continues to experience rhinorrhea. She denies nasal congestion, sore throat, los of sense of taste or smell, fever and chills. Her creatinine done on 08/28/2019 increased from 1.03 mg/dl to 1.'14mg'$ /dl and eGFR calc non Af American was 53. Overall, she states that she's feeling better and offers no further complaint.  Past Medical History:  Diagnosis Date  . Chronic kidney disease, stage III (moderate) 10/06/2017  . Diabetes mellitus without complication ([place])   . GERD (gastroesophageal reflux disease)   . Hypercholesteremia   . Hypertension 09/10/2015    Past Surgical History:  Procedure Laterality Date  . APPENDECTOMY    . CESAREAN SECTION  1991  . COLONOSCOPY WITH PROPOFOL N/A 06/03/2019   Procedure: COLONOSCOPY WITH PROPOFOL;  Surgeon: Jonathon Bellows, MD;  Location: [facility] ENDOSCOPY;  Service: Gastroenterology;  Laterality: N/A;    Family History  Problem Relation Age of Onset  . COPD Mother   . Alzheimer's disease Mother   . Hypertension Father   . Hyperlipidemia Father   . Heart attack Father   . Diabetes type II Father   . Diabetes type II Sister   . Diabetes type II Sister   . Gestational diabetes Daughter   . Diabetes type II Sister     Social History   Socioeconomic History  . Marital status: Married    Spouse name: Not on file  . Number of children: 4  . Years of education: Not on file  . Highest education level: 11th grade  Occupational History  . Occupation: na  Tobacco Use  . Smoking status: Former Smoker    Types: Cigarettes    Quit date:  04/17/2005    Years since quitting: 14.3  . Smokeless tobacco: Never Used  Substance and Sexual Activity  . Alcohol use: No    Alcohol/week: 14.0 standard drinks    Types: 14 Cans of beer per week    Comment: quit ~48moago  . Drug use: No  . Sexual activity: Not on file  Other Topics Concern  . Not on file  Social History Narrative   Lives in mobile home paid for   Social Determinants of Health   Financial Resource Strain:   . Difficulty of Paying Living Expenses: Not on file  Food Insecurity: Food Insecurity Present  . Worried About RCharity fundraiserin the Last Year: Sometimes true  . Ran Out of Food in the Last Year: Sometimes true  Transportation Needs:   . Lack of Transportation (Medical): Not on file  . Lack of Transportation (Non-Medical): Not on file  Physical Activity:   . Days of Exercise per Week: Not on file  . Minutes of Exercise per Session: Not on file  Stress:   . Feeling of Stress : Not on file  Social Connections:   . Frequency of Communication with Friends and Family: Not on file  . Frequency of Social Gatherings with Friends and Family: Not on file  . Attends Religious Services: Not on file  . Active Member of Clubs or Organizations: Not on file  .  Attends Archivist Meetings: Not on file  . Marital Status: Not on file  Intimate Partner Violence:   . Fear of Current or Ex-Partner: Not on file  . Emotionally Abused: Not on file  . Physically Abused: Not on file  . Sexually Abused: Not on file    Outpatient Medications Prior to Visit  Medication Sig Dispense Refill  . aspirin EC 81 MG tablet Take 1 tablet (81 mg total) by mouth daily.    . Dulaglutide (TRULICITY) 9.14 NW/2.9FA SOPN Inject 0.75 mg into the skin once a week. 4 pen 4  . DULoxetine (CYMBALTA) 60 MG capsule Take 1 capsule (60 mg total) by mouth daily. 30 capsule 0  . ferrous sulfate 325 (65 FE) MG tablet TAKE ONE TABLET BY MOUTH EVERY DAY WITH BREAKFAST. 30 tablet 0  .  gabapentin (NEURONTIN) 400 MG capsule TAKE ONE CAPSULE BY MOUTH 2 TIMES A DAY AND 2 CAPSULES AT BEDTIME 120 capsule 0  . insulin glargine (LANTUS) 100 UNIT/ML injection Inject 0.14 mLs (14 Units total) into the skin daily. 10 mL 1  . lisinopril (ZESTRIL) 10 MG tablet Take 1 tablet (10 mg total) by mouth daily. 90 tablet 0  . metFORMIN (GLUCOPHAGE) 1000 MG tablet Take 0.5 tablets (500 mg total) by mouth 2 (two) times daily with a meal. 60 tablet 1  . Metoprolol Tartrate 75 MG TABS Take 75 mg by mouth 2 (two) times daily. 180 tablet 1  . nitroGLYCERIN (NITROSTAT) 0.4 MG SL tablet 1 TABLET UNDER TONGUE AS NEEDED FOR CHEST PAIN EVERY 5 MINUTES FOR MAX OF 3 DOSES IN 15 MINUTES; IF NO RELIEF AFTER 1ST DOSE CALL 911 25 tablet 0  . Omega-3 Fatty Acids (FISH OIL) 1000 MG CAPS Take 1,000 mg by mouth 2 (two) times daily.    Marland Kitchen omeprazole (PRILOSEC) 20 MG capsule TAKE ONE CAPSULE BY MOUTH 2 TIMES ADAY BEFORE A MEAL 120 capsule 1  . ondansetron (ZOFRAN) 4 MG tablet Take 1 tablet (4 mg total) by mouth every 8 (eight) hours as needed. 20 tablet 0  . rosuvastatin (CRESTOR) 10 MG tablet TAKE 1/2 TABLET (5 mg) BY MOUTH EVERY DAY 30 tablet 2  . fexofenadine (ALLEGRA ODT) 30 MG disintegrating tablet Take 1 tablet (30 mg total) by mouth daily. 30 tablet 1  . cyanocobalamin ((VITAMIN B-12)) injection 1,000 mcg     . cyanocobalamin ((VITAMIN B-12)) injection 1,000 mcg      No facility-administered medications prior to visit.    Allergies  Allergen Reactions  . Etodolac Rash  . Penicillin G Rash  . Penicillins Rash    Has patient had a PCN reaction causing immediate rash, facial/tongue/throat swelling, SOB or lightheadedness with hypotension: Yes Has patient had a PCN reaction causing severe rash involving mucus membranes or skin necrosis: No Has patient had a PCN reaction that required hospitalization: No Has patient had a PCN reaction occurring within the last 10 years: No If all of the above answers are "NO",  then may proceed with Cephalosporin use.    ROS Review of Systems  Constitutional: Negative.   HENT: Positive for postnasal drip, rhinorrhea, sinus pressure and sinus pain. Negative for sore throat.   Respiratory: Negative.   Cardiovascular: Negative.   Neurological: Negative.       Objective:    Physical Exam  Constitutional: She is oriented to person, place, and time. She appears well-developed.  HENT:  Head: Normocephalic and atraumatic.  Eyes: Pupils are equal, round, and reactive to light.  EOM are normal.  Cardiovascular: Normal rate and regular rhythm.  Pulmonary/Chest: Effort normal and breath sounds normal.  Neurological: She is alert and oriented to person, place, and time.  Psychiatric: She has a normal mood and affect. Her behavior is normal. Judgment and thought content normal.    BP 134/85 (BP Location: Left Arm, Patient Position: Sitting)   Pulse 80   Ht '5\' 2"'$  (1.575 m)   Wt 189 lb (85.7 kg)   SpO2 98%   BMI 34.57 kg/m  Wt Readings from Last 3 Encounters:  09/04/19 189 lb (85.7 kg)  08/15/19 179 lb (81.2 kg)  08/10/19 180 lb (81.6 kg)  She gained 10 pounds in 3 weeks, she was advised to monitor her caloric intake and continue on her weight loss regimen.   Health Maintenance Due  Topic Date Due  . Hepatitis C Screening  14-Jul-1961  . HIV Screening  07/23/1975  . PAP SMEAR-Modifier  07/22/1981  . MAMMOGRAM  07/22/2010    There are no preventive care reminders to display for this patient.  Lab Results  Component Value Date   TSH 0.763 11/09/2017   Lab Results  Component Value Date   WBC 7.1 08/10/2019   HGB 14.9 08/10/2019   HCT 43.1 08/10/2019   MCV 87.6 08/10/2019   PLT 248 08/10/2019   Lab Results  Component Value Date   NA 141 08/28/2019   K 4.4 08/28/2019   CO2 27 08/28/2019   GLUCOSE 128 (H) 08/28/2019   BUN 16 08/28/2019   CREATININE 1.14 (H) 08/28/2019   BILITOT 0.3 06/05/2019   ALKPHOS 60 06/05/2019   AST 16 06/05/2019   ALT  14 06/05/2019   PROT 6.0 06/05/2019   ALBUMIN 3.8 06/05/2019   CALCIUM 8.9 08/28/2019   ANIONGAP 12 08/10/2019   Lab Results  Component Value Date   CHOL 125 08/28/2019   Lab Results  Component Value Date   HDL 42 08/28/2019   Lab Results  Component Value Date   LDLCALC 61 08/28/2019   Lab Results  Component Value Date   TRIG 125 08/28/2019   Lab Results  Component Value Date   CHOLHDL 3.0 08/28/2019   Lab Results  Component Value Date   HGBA1C 7.2 (A) 08/15/2019      Assessment & Plan:    1. Acute frontal sinusitis, recurrence not specified - Possible viral sinusitis, she will continue on Flonase and sodium nasal spray, she was educated on medication side effects and to notify clinic. - fluticasone (FLONASE) 50 MCG/ACT nasal spray; Place 1 spray into both nostrils daily.  Dispense: 1 g; Refill: 0 - sodium chloride (OCEAN) 0.65 % SOLN nasal spray; Place 1 spray into both nostrils as needed for congestion.  Dispense: 15 mL; Refill: 0  2. History of allergy - She will continue on Montelukast for allergy. - montelukast (SINGULAIR) 10 MG tablet; Take 1 tablet (10 mg total) by mouth at bedtime.  Dispense: 30 tablet; Refill: 0  3. Abnormal laboratory test result - Her serum creatinine was 1.14 mg/dl and eGFR was 53 mg/dl, she was advised to increase water intake, not to take NSAID. Will recheck BMP in 4 weeks. - Basic Metabolic Panel (BMET); Future    Follow-up: Return in about 4 weeks (around 10/02/2019), or if symptoms worsen or fail to improve.    Joshia Kitchings Jerold Coombe, NP

## 2019-09-04 NOTE — Patient Instructions (Signed)

## 2019-09-12 ENCOUNTER — Other Ambulatory Visit: Payer: Self-pay

## 2019-09-12 ENCOUNTER — Ambulatory Visit: Payer: Self-pay | Admitting: Licensed Clinical Social Worker

## 2019-09-12 ENCOUNTER — Ambulatory Visit: Payer: Self-pay | Admitting: Ophthalmology

## 2019-09-12 DIAGNOSIS — F3341 Major depressive disorder, recurrent, in partial remission: Secondary | ICD-10-CM

## 2019-09-12 DIAGNOSIS — F411 Generalized anxiety disorder: Secondary | ICD-10-CM

## 2019-09-12 NOTE — BH Specialist Note (Signed)
Integrated Behavioral Health Follow Up Visit Via Phone  MRN: [id number] Name: Renee Bush  Type of Service: Integrated Behavioral Health- Individual/Family Interpretor:No. Interpretor Name and Language: not applicable.   SUBJECTIVE: Renee Bush is a 59 y.o. female accompanied by herself. Patient was referred by self for mental health.  Patient reports the following symptoms/concerns: She notes that she was sick right before Christmas. She notes that things with her family have been good.She notes that she is trying to stay busy and occupied. She denies feeling anxious or nervous. She denies feeling down or depressed. She denies suicidal and homicidal thoughts.  Duration of problem: ; Severity of problem: mild  OBJECTIVE: Mood: Euthymic and Affect: Appropriate Risk of harm to self or others: No plan to harm self or others  LIFE CONTEXT: Family and Social: see above. School/Work: see above. Self-Care: see above. Life Changes: see above.  GOALS ADDRESSED: Patient will: 1.  Reduce symptoms of:  2.  Increase knowledge and/or ability of: healthy habits  3.  Demonstrate ability to: Increase healthy adjustment to current life circumstances  INTERVENTIONS: Interventions utilized:  Supportive Counseling was utilized by the clinician during today's follow up session. Clinician processed with the patient regarding how she has been doing since the last follow up session. Clinician administered a PHQ 9 and GAD 7 to measure the patient's symptoms of anxiety and depression on a numerical scale. Clinician discussed with the patient regarding how things have been in terms of her family. Clinician explained to the patient that she needs to continue to practice self care and take her medication as prescribed.  Standardized Assessments completed: GAD-7 and PHQ 9  ASSESSMENT: Patient currently experiencing see above.  Patient may benefit from see above.  PLAN: 1. Follow up with  behavioral health clinician on :  2. Behavioral recommendations: see above. 3. Referral(s): Integrated [employer] (In Clinic) 4. "From scale of 1-10, how likely are you to follow plan?":   Althia Forts, LCSW

## 2019-09-14 ENCOUNTER — Other Ambulatory Visit: Payer: Self-pay

## 2019-09-14 MED ORDER — DULOXETINE HCL 60 MG PO CPEP: 60.0000 mg | ORAL_CAPSULE | Freq: Every day | ORAL | 2 refills | Status: DC

## 2019-09-25 ENCOUNTER — Other Ambulatory Visit: Payer: Self-pay

## 2019-09-25 DIAGNOSIS — R899 Unspecified abnormal finding in specimens from other organs, systems and tissues: Secondary | ICD-10-CM

## 2019-09-26 ENCOUNTER — Telehealth: Payer: Self-pay | Admitting: Pharmacist

## 2019-09-26 LAB — BASIC METABOLIC PANEL
BUN/Creatinine Ratio: 16 (ref 9–23)
BUN: 15 mg/dL (ref 6–24)
CO2: 24 mmol/L (ref 20–29)
Calcium: 9 mg/dL (ref 8.7–10.2)
Chloride: 105 mmol/L (ref 96–106)
Creatinine, Ser: 0.94 mg/dL (ref 0.57–1.00)
GFR calc Af Amer: 77 mL/min/{1.73_m2} (ref >59)
GFR calc non Af Amer: 67 mL/min/{1.73_m2} (ref >59)
Glucose: 192 mg/dL — ABNORMAL HIGH (ref 65–99)
Potassium: 4.5 mmol/L (ref 3.5–5.2)
Sodium: 142 mmol/L (ref 134–144)

## 2019-09-26 NOTE — Telephone Encounter (Signed)
09/26/2019 10:41:25 AM - Lantus Solostar Dose change/refill  09/26/2019 Sending [employer] refill request to [facility] for Dr. Candelaria Stagers to sign for DOSE CHANGE & REFILL--Lantus Solostar Pen Inject 14 units into the skin daily #1 box. Forde Radon

## 2019-10-02 ENCOUNTER — Other Ambulatory Visit: Payer: Self-pay

## 2019-10-02 ENCOUNTER — Ambulatory Visit: Payer: Self-pay | Admitting: Gerontology

## 2019-10-02 ENCOUNTER — Encounter: Payer: Self-pay | Admitting: Gerontology

## 2019-10-02 VITALS — BP 132/85 | HR 81 | Ht 62.0 in | Wt 186.0 lb

## 2019-10-02 DIAGNOSIS — Z794 Long term (current) use of insulin: Secondary | ICD-10-CM

## 2019-10-02 DIAGNOSIS — Z889 Allergy status to unspecified drugs, medicaments and biological substances status: Secondary | ICD-10-CM

## 2019-10-02 DIAGNOSIS — E538 Deficiency of other specified B group vitamins: Secondary | ICD-10-CM

## 2019-10-02 DIAGNOSIS — E1140 Type 2 diabetes mellitus with diabetic neuropathy, unspecified: Secondary | ICD-10-CM

## 2019-10-02 DIAGNOSIS — J0110 Acute frontal sinusitis, unspecified: Secondary | ICD-10-CM

## 2019-10-02 MED ORDER — MONTELUKAST SODIUM 10 MG PO TABS: 10.0000 mg | ORAL_TABLET | Freq: Every day | ORAL | 2 refills | Status: DC

## 2019-10-02 MED ORDER — GABAPENTIN 400 MG PO CAPS: ORAL_CAPSULE | ORAL | 1 refills | Status: DC

## 2019-10-02 MED ORDER — FERROUS SULFATE 325 (65 FE) MG PO TABS: ORAL_TABLET | ORAL | 2 refills | Status: DC

## 2019-10-02 MED ORDER — CYANOCOBALAMIN 1000 MCG/ML IJ SOLN: 1000.0000 ug | Freq: Once | INTRAMUSCULAR | Status: DC

## 2019-10-02 NOTE — Patient Instructions (Signed)
Carbohydrate Counting for Diabetes Mellitus, Adult  Carbohydrate counting is a method of keeping track of how many carbohydrates you eat. Eating carbohydrates naturally increases the amount of sugar (glucose) in the blood. Counting how many carbohydrates you eat helps keep your blood glucose within normal limits, which helps you manage your diabetes (diabetes mellitus). It is important to know how many carbohydrates you can safely have in each meal. This is different for every person. A diet and nutrition specialist (registered dietitian) can help you make a meal plan and calculate how many carbohydrates you should have at each meal and snack. Carbohydrates are found in the following foods:  Grains, such as breads and cereals.  Dried beans and soy products.  Starchy vegetables, such as potatoes, peas, and corn.  Fruit and fruit juices.  Milk and yogurt.  Sweets and snack foods, such as cake, cookies, candy, chips, and soft drinks. How do I count carbohydrates? There are two ways to count carbohydrates in food. You can use either of the methods or a combination of both. Reading "Nutrition Facts" on packaged food The "Nutrition Facts" list is included on the labels of almost all packaged foods and beverages in the [place] It includes:  The serving size.  Information about nutrients in each serving, including the grams (g) of carbohydrate per serving. To use the "Nutrition Facts":  Decide how many servings you will have.  Multiply the number of servings by the number of carbohydrates per serving.  The resulting number is the total amount of carbohydrates that you will be having. Learning standard serving sizes of other foods When you eat carbohydrate foods that are not packaged or do not include "Nutrition Facts" on the label, you need to measure the servings in order to count the amount of carbohydrates:  Measure the foods that you will eat with a food scale or measuring cup, if  needed.  Decide how many standard-size servings you will eat.  Multiply the number of servings by 15. Most carbohydrate-rich foods have about 15 g of carbohydrates per serving. ? For example, if you eat 8 oz (170 g) of strawberries, you will have eaten 2 servings and 30 g of carbohydrates (2 servings x 15 g = 30 g).  For foods that have more than one food mixed, such as soups and casseroles, you must count the carbohydrates in each food that is included. The following list contains standard serving sizes of common carbohydrate-rich foods. Each of these servings has about 15 g of carbohydrates:   hamburger bun or  English muffin.   oz (15 mL) syrup.   oz (14 g) jelly.  1 slice of bread.  1 six-inch tortilla.  3 oz (85 g) cooked rice or pasta.  4 oz (113 g) cooked dried beans.  4 oz (113 g) starchy vegetable, such as peas, corn, or potatoes.  4 oz (113 g) hot cereal.  4 oz (113 g) mashed potatoes or  of a large baked potato.  4 oz (113 g) canned or frozen fruit.  4 oz (120 mL) fruit juice.  4-6 crackers.  6 chicken nuggets.  6 oz (170 g) unsweetened dry cereal.  6 oz (170 g) plain fat-free yogurt or yogurt sweetened with artificial sweeteners.  8 oz (240 mL) milk.  8 oz (170 g) fresh fruit or one small piece of fruit.  24 oz (680 g) popped popcorn. Example of carbohydrate counting Sample meal  3 oz (85 g) chicken breast.  6 oz (170 g)   brown rice.  4 oz (113 g) corn.  8 oz (240 mL) milk.  8 oz (170 g) strawberries with sugar-free whipped topping. Carbohydrate calculation 1. Identify the foods that contain carbohydrates: ? Rice. ? Corn. ? Milk. ? Strawberries. 2. Calculate how many servings you have of each food: ? 2 servings rice. ? 1 serving corn. ? 1 serving milk. ? 1 serving strawberries. 3. Multiply each number of servings by 15 g: ? 2 servings rice x 15 g = 30 g. ? 1 serving corn x 15 g = 15 g. ? 1 serving milk x 15 g = 15 g. ? 1  serving strawberries x 15 g = 15 g. 4. Add together all of the amounts to find the total grams of carbohydrates eaten: ? 30 g + 15 g + 15 g + 15 g = 75 g of carbohydrates total. Summary  Carbohydrate counting is a method of keeping track of how many carbohydrates you eat.  Eating carbohydrates naturally increases the amount of sugar (glucose) in the blood.  Counting how many carbohydrates you eat helps keep your blood glucose within normal limits, which helps you manage your diabetes.  A diet and nutrition specialist (registered dietitian) can help you make a meal plan and calculate how many carbohydrates you should have at each meal and snack. This information is not intended to replace advice given to you by your health care provider. Make sure you discuss any questions you have with your health care provider. Document Revised: 03/09/2017 Document Reviewed: 01/27/2016 Elsevier Patient Education  2020 [employer]  

## 2019-10-02 NOTE — Progress Notes (Signed)
Established Patient Office Visit  Subjective:  Patient ID: Renee Bush, female    DOB: 12-Jun-1961  Age: 59 y.o. MRN: [id number]  CC:  Chief Complaint  Patient presents with  . Diabetes    HPI [name] presents for follow up of sinus pressure ,type 2 diabetes, lab review and medication refill. She states that her sinus pressure has improved 80%. Her creatinine and eGFR calc non Af American  done on 09/25/2019 was normal. Her HgbA1c done on 08/15/2019 increased from 6.1% to 7.2%.  She states that she continues to take Trulicity 1.63 mg weekly, Lantus 14 units daily and 500 mg Metformin twice daily.  She states that she checks her blood glucose twice daily and her fasting readings are usually less than 100 mg per DL.  She denies hypoglycemic/hyperglycemic symptoms, and performs daily foot checks.  She states that taking 400 mg gabapentin relieves her peripheral  Neuropathic symptoms. She denies chest pain, palpitation, lightheadedness, fever and chills.  Overall she states that she doing well and offers no further complaints.  Past Medical History:  Diagnosis Date  . Chronic kidney disease, stage III (moderate) 10/06/2017  . Diabetes mellitus without complication ([place])   . GERD (gastroesophageal reflux disease)   . Hypercholesteremia   . Hypertension 09/10/2015    Past Surgical History:  Procedure Laterality Date  . APPENDECTOMY    . CESAREAN SECTION  1991  . COLONOSCOPY WITH PROPOFOL N/A 06/03/2019   Procedure: COLONOSCOPY WITH PROPOFOL;  Surgeon: Jonathon Bellows, MD;  Location: [facility];  Service: Gastroenterology;  Laterality: N/A;    Family History  Problem Relation Age of Onset  . COPD Mother   . Alzheimer's disease Mother   . Hypertension Father   . Hyperlipidemia Father   . Heart attack Father   . Diabetes type II Father   . Diabetes type II Sister   . Diabetes type II Sister   . Gestational diabetes Daughter   . Diabetes type II Sister     Social  History   Socioeconomic History  . Marital status: Married    Spouse name: Not on file  . Number of children: 4  . Years of education: Not on file  . Highest education level: 11th grade  Occupational History  . Occupation: na  Tobacco Use  . Smoking status: Former Smoker    Types: Cigarettes    Quit date: 04/17/2005    Years since quitting: 14.4  . Smokeless tobacco: Never Used  Substance and Sexual Activity  . Alcohol use: No    Alcohol/week: 14.0 standard drinks    Types: 14 Cans of beer per week    Comment: quit ~61moago  . Drug use: No  . Sexual activity: Not on file  Other Topics Concern  . Not on file  Social History Narrative   Lives in mobile home paid for   Social Determinants of Health   Financial Resource Strain:   . Difficulty of Paying Living Expenses: Not on file  Food Insecurity: Food Insecurity Present  . Worried About RCharity fundraiserin the Last Year: Sometimes true  . Ran Out of Food in the Last Year: Sometimes true  Transportation Needs:   . Lack of Transportation (Medical): Not on file  . Lack of Transportation (Non-Medical): Not on file  Physical Activity:   . Days of Exercise per Week: Not on file  . Minutes of Exercise per Session: Not on file  Stress:   . Feeling  of Stress : Not on file  Social Connections:   . Frequency of Communication with Friends and Family: Not on file  . Frequency of Social Gatherings with Friends and Family: Not on file  . Attends Religious Services: Not on file  . Active Member of Clubs or Organizations: Not on file  . Attends Archivist Meetings: Not on file  . Marital Status: Not on file  Intimate Partner Violence:   . Fear of Current or Ex-Partner: Not on file  . Emotionally Abused: Not on file  . Physically Abused: Not on file  . Sexually Abused: Not on file    Outpatient Medications Prior to Visit  Medication Sig Dispense Refill  . aspirin EC 81 MG tablet Take 1 tablet (81 mg total) by mouth  daily.    . Dulaglutide (TRULICITY) 6.83 MH/9.6QI SOPN Inject 0.75 mg into the skin once a week. 4 pen 4  . DULoxetine (CYMBALTA) 60 MG capsule Take 1 capsule (60 mg total) by mouth daily. 30 capsule 2  . fluticasone (FLONASE) 50 MCG/ACT nasal spray Place 1 spray into both nostrils daily. 1 g 0  . insulin glargine (LANTUS) 100 UNIT/ML injection Inject 0.14 mLs (14 Units total) into the skin daily. 10 mL 1  . lisinopril (ZESTRIL) 10 MG tablet Take 1 tablet (10 mg total) by mouth daily. 90 tablet 0  . metFORMIN (GLUCOPHAGE) 1000 MG tablet Take 0.5 tablets (500 mg total) by mouth 2 (two) times daily with a meal. 60 tablet 1  . Metoprolol Tartrate 75 MG TABS Take 75 mg by mouth 2 (two) times daily. 180 tablet 1  . nitroGLYCERIN (NITROSTAT) 0.4 MG SL tablet 1 TABLET UNDER TONGUE AS NEEDED FOR CHEST PAIN EVERY 5 MINUTES FOR MAX OF 3 DOSES IN 15 MINUTES; IF NO RELIEF AFTER 1ST DOSE CALL 911 25 tablet 0  . Omega-3 Fatty Acids (FISH OIL) 1000 MG CAPS Take 1,000 mg by mouth 2 (two) times daily.    Marland Kitchen omeprazole (PRILOSEC) 20 MG capsule TAKE ONE CAPSULE BY MOUTH 2 TIMES ADAY BEFORE A MEAL 120 capsule 1  . rosuvastatin (CRESTOR) 10 MG tablet TAKE 1/2 TABLET (5 mg) BY MOUTH EVERY DAY 30 tablet 2  . ferrous sulfate 325 (65 FE) MG tablet TAKE ONE TABLET BY MOUTH EVERY DAY WITH BREAKFAST. 30 tablet 0  . gabapentin (NEURONTIN) 400 MG capsule TAKE ONE CAPSULE BY MOUTH 2 TIMES A DAY AND 2 CAPSULES AT BEDTIME 120 capsule 0  . montelukast (SINGULAIR) 10 MG tablet Take 1 tablet (10 mg total) by mouth at bedtime. 30 tablet 0  . ondansetron (ZOFRAN) 4 MG tablet Take 1 tablet (4 mg total) by mouth every 8 (eight) hours as needed. 20 tablet 0  . sodium chloride (OCEAN) 0.65 % SOLN nasal spray Place 1 spray into both nostrils as needed for congestion. 15 mL 0   No facility-administered medications prior to visit.    Allergies  Allergen Reactions  . Etodolac Rash  . Penicillin G Rash  . Penicillins Rash    Has patient  had a PCN reaction causing immediate rash, facial/tongue/throat swelling, SOB or lightheadedness with hypotension: Yes Has patient had a PCN reaction causing severe rash involving mucus membranes or skin necrosis: No Has patient had a PCN reaction that required hospitalization: No Has patient had a PCN reaction occurring within the last 10 years: No If all of the above answers are "NO", then may proceed with Cephalosporin use.    ROS Review of Systems  Constitutional: Negative.   HENT: Positive for sinus pressure.   Respiratory: Negative.   Cardiovascular: Negative.   Gastrointestinal: Negative.   Neurological: Negative.   Psychiatric/Behavioral: Negative.       Objective:    Physical Exam  Constitutional: She is oriented to person, place, and time. She appears well-developed and well-nourished.  HENT:  Head: Normocephalic and atraumatic.  Eyes: Pupils are equal, round, and reactive to light. EOM are normal.  Cardiovascular: Normal rate and regular rhythm.  Pulmonary/Chest: Effort normal and breath sounds normal.  Neurological: She is alert and oriented to person, place, and time.  Skin: Skin is warm and dry.  Psychiatric: She has a normal mood and affect. Her behavior is normal. Judgment and thought content normal.    BP 132/85 (BP Location: Left Arm, Patient Position: Sitting)   Pulse 81   Ht '5\' 2"'$  (1.575 m)   Wt 186 lb (84.4 kg)   SpO2 96%   BMI 34.02 kg/m  Wt Readings from Last 3 Encounters:  10/02/19 186 lb (84.4 kg)  09/04/19 189 lb (85.7 kg)  08/15/19 179 lb (81.2 kg)    She lost 3 pounds in 3 weeks and she was encouraged to continue on current weight loss regimen.  Health Maintenance Due  Topic Date Due  . Hepatitis C Screening  10/13/1959  . HIV Screening  07/23/1975  . PAP SMEAR-Modifier  07/22/1981  . MAMMOGRAM  07/22/2010  . OPHTHALMOLOGY EXAM  09/07/2019    There are no preventive care reminders to display for this patient.  Lab Results   Component Value Date   TSH 0.763 11/09/2017   Lab Results  Component Value Date   WBC 7.1 08/10/2019   HGB 14.9 08/10/2019   HCT 43.1 08/10/2019   MCV 87.6 08/10/2019   PLT 248 08/10/2019   Lab Results  Component Value Date   NA 142 09/25/2019   K 4.5 09/25/2019   CO2 24 09/25/2019   GLUCOSE 192 (H) 09/25/2019   BUN 15 09/25/2019   CREATININE 0.94 09/25/2019   BILITOT 0.3 06/05/2019   ALKPHOS 60 06/05/2019   AST 16 06/05/2019   ALT 14 06/05/2019   PROT 6.0 06/05/2019   ALBUMIN 3.8 06/05/2019   CALCIUM 9.0 09/25/2019   ANIONGAP 12 08/10/2019   Lab Results  Component Value Date   CHOL 125 08/28/2019   Lab Results  Component Value Date   HDL 42 08/28/2019   Lab Results  Component Value Date   LDLCALC 61 08/28/2019   Lab Results  Component Value Date   TRIG 125 08/28/2019   Lab Results  Component Value Date   CHOLHDL 3.0 08/28/2019   Lab Results  Component Value Date   HGBA1C 7.2 (A) 08/15/2019      Assessment & Plan:    1. Type 2 diabetes mellitus with diabetic neuropathy, with long-term current use of insulin (HCC) Her HgbA1c was 7.2% and her goal should be < 7%. She was advised to check blood glucose twice daily, record and bring log to follow up appointment. Her goal fasting blood glucose reading should be between 80-130 mg/dl. She was encouraged to continue on low carb/non concentrated sweet diet, and exercise as tolerated. - ferrous sulfate 325 (65 FE) MG tablet; TAKE ONE TABLET BY MOUTH EVERY DAY WITH BREAKFAST.  Dispense: 30 tablet; Refill: 2 - gabapentin (NEURONTIN) 400 MG capsule; TAKE ONE CAPSULE BY MOUTH 2 TIMES A DAY AND 2 CAPSULES AT BEDTIME  Dispense: 120 capsule; Refill: 1 - HgB  A1c; Future  2. History of allergy - Her Allergy is is under control and she will continue on current treatment regimen. - montelukast (SINGULAIR) 10 MG tablet; Take 1 tablet (10 mg total) by mouth at bedtime.  Dispense: 30 tablet; Refill: 2  3. B12  deficiency - Routine labs will be checked, and - cyanocobalamin ((VITAMIN B-12)) injection 1,000 mcg was administered. - B12 and Folate Panel; Future - Iron Binding Cap (TIBC)([facility]); Future - Iron; Future  4. Acute frontal sinusitis, recurrence not specified - She was advised to continue using Nasal saline spray and Humidifier.    Follow-up: Return in about 7 weeks (around 11/20/2019), or if symptoms worsen or fail to improve.    Antonius Hartlage Jerold Coombe, NP

## 2019-10-15 ENCOUNTER — Other Ambulatory Visit: Payer: Self-pay

## 2019-10-15 ENCOUNTER — Ambulatory Visit: Payer: Self-pay | Admitting: Student

## 2019-10-15 DIAGNOSIS — I152 Hypertension secondary to endocrine disorders: Secondary | ICD-10-CM

## 2019-10-15 DIAGNOSIS — Z794 Long term (current) use of insulin: Secondary | ICD-10-CM

## 2019-10-15 DIAGNOSIS — E7800 Pure hypercholesterolemia, unspecified: Secondary | ICD-10-CM

## 2019-10-15 DIAGNOSIS — E1159 Type 2 diabetes mellitus with other circulatory complications: Secondary | ICD-10-CM

## 2019-10-15 DIAGNOSIS — N1831 Chronic kidney disease, stage 3a: Secondary | ICD-10-CM

## 2019-10-15 DIAGNOSIS — E1140 Type 2 diabetes mellitus with diabetic neuropathy, unspecified: Secondary | ICD-10-CM

## 2019-10-15 NOTE — Progress Notes (Signed)
New Diabetes Consultation [facility]     Patient ID: Renee Bush, female   DOB: 06/25/1961, 59 y.o.   MRN: [id number] Assessment & Plan:  Renee Bush is a 59 y.o. female who is seen in consultation for T2DM at the request of Iloabachie, Chioma E, NP.   Plan: Follow up with [facility] in 3 months  T2DM: Renee Bush has well-controlled T2DM as well as a history of CKD and HTN. Renee Bush may benefit from adding an SGLT2 inhibitor to her T2DM care, given its renal-protective effects. She is also only on 0.75mg  Trulicity weekly, and could possibly increase this to 1.5mg  weekly. One target for Renee Bush would be to remove the need for daily long-acting insulin in favor of management with an SGLT2 inhibitor as well as continued adherence to her Trulicity medication.     Subjective:  Renee Bush is a 59 yo female who is seen today for follow up evaluation of T2DM. She states that she has been doing well recently, despite losing power with the recent weather in [place]. She was first diagnosed with T2DM in 1997 when she was admitted to the hospital. Recently, her T2DM appears well controlled with most recent A1C of 7.2 and blood glucoses at home between 107 AM and 120-160 PM (checks 3x daily or more). Her typical diet can include salads, spaghetti, chicken pie, green beans and corn. She states that she can tell when she is hypoglycemic - she feels hot, sweaty and shaky. Denies any nocturia or foot ulcers.  Renee Bush last retinopathy exam was in 2020. She takes Trulicity (0.75) every Sunday, as well as 14 units long-acting insulin every evening. Of note, she is also currently taking lisinopril and rosuvastatin.     Review of Systems      Renee Bush  has a past medical history of Chronic kidney disease, stage III (moderate) (10/06/2017), Diabetes mellitus without complication (HCC), GERD (gastroesophageal reflux disease), Hypercholesteremia, and Hypertension  (09/10/2015).  Renee Bush family history includes Alzheimer's disease in her mother; COPD in her mother; Diabetes type II in her father, sister, sister, and sister; Gestational diabetes in her daughter; Heart attack in her father; Hyperlipidemia in her father; Hypertension in her father.  Renee Bush  reports that she quit smoking about 14 years ago. Her smoking use included cigarettes. She has never used smokeless tobacco. She reports that she does not drink alcohol or use drugs.   Current Outpatient Medications:  .  aspirin EC 81 MG tablet, Take 1 tablet (81 mg total) by mouth daily., Disp: , Rfl:  .  Dulaglutide (TRULICITY) 0.75 MG/0.5ML SOPN, Inject 0.75 mg into the skin once a week., Disp: 4 pen, Rfl: 4 .  DULoxetine (CYMBALTA) 60 MG capsule, Take 1 capsule (60 mg total) by mouth daily., Disp: 30 capsule, Rfl: 2 .  ferrous sulfate 325 (65 FE) MG tablet, TAKE ONE TABLET BY MOUTH EVERY DAY WITH BREAKFAST., Disp: 30 tablet, Rfl: 2 .  fluticasone (FLONASE) 50 MCG/ACT nasal spray, Place 1 spray into both nostrils daily., Disp: 1 g, Rfl: 0 .  gabapentin (NEURONTIN) 400 MG capsule, TAKE ONE CAPSULE BY MOUTH 2 TIMES A DAY AND 2 CAPSULES AT BEDTIME, Disp: 120 capsule, Rfl: 1 .  insulin glargine (LANTUS) 100 UNIT/ML injection, Inject 0.14 mLs (14 Units total) into the skin daily., Disp: 10 mL, Rfl: 1 .  lisinopril (ZESTRIL) 10 MG tablet, Take 1 tablet (10 mg total) by mouth daily., Disp: 90  tablet, Rfl: 0 .  metFORMIN (GLUCOPHAGE) 1000 MG tablet, Take 0.5 tablets (500 mg total) by mouth 2 (two) times daily with a meal., Disp: 60 tablet, Rfl: 1 .  Metoprolol Tartrate 75 MG TABS, Take 75 mg by mouth 2 (two) times daily., Disp: 180 tablet, Rfl: 1 .  montelukast (SINGULAIR) 10 MG tablet, Take 1 tablet (10 mg total) by mouth at bedtime., Disp: 30 tablet, Rfl: 2 .  nitroGLYCERIN (NITROSTAT) 0.4 MG SL tablet, 1 TABLET UNDER TONGUE AS NEEDED FOR CHEST PAIN EVERY 5 MINUTES FOR MAX OF 3 DOSES IN  15 MINUTES; IF NO RELIEF AFTER 1ST DOSE CALL 911, Disp: 25 tablet, Rfl: 0 .  Omega-3 Fatty Acids (FISH OIL) 1000 MG CAPS, Take 1,000 mg by mouth 2 (two) times daily., Disp: , Rfl:  .  omeprazole (PRILOSEC) 20 MG capsule, TAKE ONE CAPSULE BY MOUTH 2 TIMES ADAY BEFORE A MEAL, Disp: 120 capsule, Rfl: 1 .  rosuvastatin (CRESTOR) 10 MG tablet, TAKE 1/2 TABLET (5 mg) BY MOUTH EVERY DAY, Disp: 30 tablet, Rfl: 2 .  sodium chloride (OCEAN) 0.65 % SOLN nasal spray, Place 1 spray into both nostrils as needed for congestion., Disp: 15 mL, Rfl: 0  Current Facility-Administered Medications:  .  cyanocobalamin ((VITAMIN B-12)) injection 1,000 mcg, 1,000 mcg, Intramuscular, Once, Iloabachie, Chioma E, NP  Allergies  Allergen Reactions  . Etodolac Rash  . Penicillin G Rash  . Penicillins Rash    Has patient had a PCN reaction causing immediate rash, facial/tongue/throat swelling, SOB or lightheadedness with hypotension: Yes Has patient had a PCN reaction causing severe rash involving mucus membranes or skin necrosis: No Has patient had a PCN reaction that required hospitalization: No Has patient had a PCN reaction occurring within the last 10 years: No If all of the above answers are "NO", then may proceed with Cephalosporin use.   Objective:    Physical Exam This was a telephone encounter.     Data/Results/Labs  : I have personally reviewed pertinent labs and imaging studies, if indicated,  with the patient in clinic today.  Results for orders placed or performed in visit on 09/25/19 (from the past 672 hour(s))  Basic Metabolic Panel (BMET)   Collection Time: 09/25/19 10:35 AM  Result Value Ref Range   Glucose 192 (H) 65 - 99 mg/dL   BUN 15 6 - 24 mg/dL   Creatinine, Ser 0.94 0.57 - 1.00 mg/dL   GFR calc non Af Amer 67 >59 mL/min/1.73   GFR calc Af Amer 77 >59 mL/min/1.73   BUN/Creatinine Ratio 16 9 - 23   Sodium 142 134 - 144 mmol/L   Potassium 4.5 3.5 - 5.2 mmol/L   Chloride 105 96 - 106  mmol/L   CO2 24 20 - 29 mmol/L   Calcium 9.0 8.7 - 10.2 mg/dL  ]  HC Readings from Last 3 Encounters:  No data found for [facility]    Wt Readings from Last 3 Encounters:  10/02/19 186 lb (84.4 kg)  09/04/19 189 lb (85.7 kg)  08/15/19 179 lb (81.2 kg)     Katarzyna Wolven ([place]) Gerald Dexter, MS3

## 2019-10-25 ENCOUNTER — Telehealth: Payer: Self-pay | Admitting: Pharmacist

## 2019-10-25 NOTE — Telephone Encounter (Signed)
10/25/2019 8:28:06 AM - Lantus Solostar refill/dose change to [employer]  -- [name] - Friday, October 25, 2019 8:26 AM -- Arneta Cliche [employer] refill request for Lantus Solostar Inject 14 units into the skin daily #1, showing Dose Change.

## 2019-10-27 ENCOUNTER — Telehealth: Payer: Self-pay | Admitting: Pharmacy Technician

## 2019-10-27 NOTE — Telephone Encounter (Signed)
Received updated proof of income.  Patient eligible to receive medication assistance at [facility] until time for re-certification in 9359, and as long as eligibility requirements continue to be met.  [facility]

## 2019-11-13 ENCOUNTER — Other Ambulatory Visit: Payer: Self-pay

## 2019-11-13 DIAGNOSIS — E538 Deficiency of other specified B group vitamins: Secondary | ICD-10-CM

## 2019-11-13 DIAGNOSIS — E1140 Type 2 diabetes mellitus with diabetic neuropathy, unspecified: Secondary | ICD-10-CM

## 2019-11-13 DIAGNOSIS — Z794 Long term (current) use of insulin: Secondary | ICD-10-CM

## 2019-11-14 LAB — B12 AND FOLATE PANEL
Folate: 9.1 ng/mL (ref >3.0)
Vitamin B-12: 377 pg/mL (ref 232–1245)

## 2019-11-14 LAB — IRON AND TIBC
Iron Saturation: 18 % (ref 15–55)
Iron: 57 ug/dL (ref 27–159)
Total Iron Binding Capacity: 311 ug/dL (ref 250–450)
UIBC: 254 ug/dL (ref 131–425)

## 2019-11-14 LAB — HEMOGLOBIN A1C
Est. average glucose Bld gHb Est-mCnc: 151 mg/dL
Hgb A1c MFr Bld: 6.9 % — ABNORMAL HIGH (ref 4.8–5.6)

## 2019-11-19 ENCOUNTER — Other Ambulatory Visit: Payer: Self-pay

## 2019-11-20 ENCOUNTER — Ambulatory Visit: Payer: Self-pay | Attending: Obstetrics and Gynecology

## 2019-11-20 ENCOUNTER — Ambulatory Visit: Payer: Self-pay | Admitting: Gerontology

## 2019-11-20 ENCOUNTER — Encounter: Payer: Self-pay | Admitting: Gerontology

## 2019-11-20 VITALS — BP 144/86 | HR 85 | Temp 97.2°F | Ht 62.75 in | Wt 179.9 lb

## 2019-11-20 VITALS — BP 129/83 | HR 81 | Temp 96.7°F | Ht 62.0 in | Wt 178.6 lb

## 2019-11-20 DIAGNOSIS — E7800 Pure hypercholesterolemia, unspecified: Secondary | ICD-10-CM

## 2019-11-20 DIAGNOSIS — Z794 Long term (current) use of insulin: Secondary | ICD-10-CM

## 2019-11-20 DIAGNOSIS — Z889 Allergy status to unspecified drugs, medicaments and biological substances status: Secondary | ICD-10-CM

## 2019-11-20 DIAGNOSIS — Z0000 Encounter for general adult medical examination without abnormal findings: Secondary | ICD-10-CM

## 2019-11-20 DIAGNOSIS — E1140 Type 2 diabetes mellitus with diabetic neuropathy, unspecified: Secondary | ICD-10-CM

## 2019-11-20 DIAGNOSIS — I10 Essential (primary) hypertension: Secondary | ICD-10-CM

## 2019-11-20 DIAGNOSIS — J0110 Acute frontal sinusitis, unspecified: Secondary | ICD-10-CM

## 2019-11-20 DIAGNOSIS — E538 Deficiency of other specified B group vitamins: Secondary | ICD-10-CM

## 2019-11-20 DIAGNOSIS — N644 Mastodynia: Secondary | ICD-10-CM

## 2019-11-20 MED ORDER — FLUTICASONE PROPIONATE 50 MCG/ACT NA SUSP: 1.0000 | Freq: Every day | NASAL | 1 refills | Status: DC

## 2019-11-20 MED ORDER — CYANOCOBALAMIN 1000 MCG/ML IJ SOLN: 1000.0000 ug | INTRAMUSCULAR | Status: DC

## 2019-11-20 MED ORDER — CANAGLIFLOZIN 100 MG PO TABS: 100.0000 mg | ORAL_TABLET | Freq: Every day | ORAL | 0 refills | Status: DC

## 2019-11-20 MED ORDER — GABAPENTIN 400 MG PO CAPS: ORAL_CAPSULE | ORAL | 1 refills | Status: DC

## 2019-11-20 MED ORDER — CETIRIZINE HCL 5 MG PO TABS: 5.0000 mg | ORAL_TABLET | Freq: Every day | ORAL | 0 refills | Status: DC

## 2019-11-20 MED ORDER — CETIRIZINE HCL 10 MG PO TABS: 10.0000 mg | ORAL_TABLET | Freq: Every day | ORAL | 0 refills | Status: DC

## 2019-11-20 MED ORDER — ROSUVASTATIN CALCIUM 10 MG PO TABS: ORAL_TABLET | ORAL | 2 refills | Status: DC

## 2019-11-20 MED ORDER — LISINOPRIL 10 MG PO TABS: 10.0000 mg | ORAL_TABLET | Freq: Every day | ORAL | 0 refills | Status: DC

## 2019-11-20 MED ORDER — METOPROLOL TARTRATE 75 MG PO TABS: 75.0000 mg | ORAL_TABLET | Freq: Two times a day (BID) | ORAL | 2 refills | Status: DC

## 2019-11-20 MED ORDER — METFORMIN HCL 1000 MG PO TABS: 500.0000 mg | ORAL_TABLET | Freq: Two times a day (BID) | ORAL | 2 refills | Status: DC

## 2019-11-20 NOTE — Progress Notes (Signed)
  Subjective:     Patient ID: Renee Bush, female   DOB: 09-Feb-1961, 59 y.o.   MRN: [id number]  HPI   Review of Systems     Objective:   Physical Exam Genitourinary:    Labia:        Right: No rash, tenderness, lesion or injury.        Left: No rash, tenderness, lesion or injury.      Vagina: No signs of injury and foreign body. No vaginal discharge, erythema, tenderness, bleeding, lesions or prolapsed vaginal walls.     Cervix: Friability, erythema and cervical bleeding present. No cervical motion tenderness, discharge, lesion or eversion.     Uterus: Not deviated, not enlarged, not fixed, not tender and no uterine prolapse.      Adnexa:        Right: No mass, tenderness or fullness.         Left: No mass, tenderness or fullness.          Comments: Scant bleeding on pap collection       Assessment:     59 year old patient presents for [facility] clinic visit.  Patient screened, and meets [facility] eligibility.  Patient does not have insurance, Medicare or Medicaid. Instructed patient on breast self awareness using teach back method. Clinical breast exam reveals bilateral fibroglandular breast tissue.  Patient complains of targeted left breast pain on palpation at 6 o'clock.  Rates pain 5 on 0-10 scale.  Pain has been there a month, and is not relieved with Tylenol or ibuprofen. Pelvic exam normal.  Scant bleeding on pap collection.    Risk Assessment    Risk Scores      11/20/2019   Last edited by: Alta Corning, CMA   5-year risk: 2.9 %   Lifetime risk: 15.1 %             Plan:     Scheduled for diagnostic mammogram on 11/27/19 at 2:40.  Specimen collected for pap.

## 2019-11-20 NOTE — Progress Notes (Signed)
  Subjective:     Patient ID: Renee Bush, female   DOB: 12/09/1960, 59 y.o.   MRN: [id number]  HPI   Review of Systems     Objective:   Physical Exam Chest:     Breasts:        Right: No swelling, bleeding, inverted nipple, mass, nipple discharge, skin change or tenderness.        Left: Tenderness present. No swelling, bleeding, inverted nipple, mass, nipple discharge or skin change.       Comments: Targeted left breast pain        Assessment:  close    Plan:     close

## 2019-11-20 NOTE — Progress Notes (Signed)
Established Patient Office Visit  Subjective:  Patient ID: Renee Bush, female    DOB: August 09, 1961  Age: 59 y.o. MRN: [id number]  CC:  Chief Complaint  Patient presents with  . Follow-up    HPI Renee Bush presents for follow up of type 2 diabetes, Genella Rife, hypertension,  lab review and medication refill. She states that she's compliant with her medications and continues to make healthy lifestyle changes. Her HgbA1c done on 11/13/2019 decreased from 7.2% to 6.9%. She had a telephone visit with [name] on 10/15/2019, and Dr Lorelee Cover suggested starting her on SGLT2 and discontinuing her Lantus. She states that she checks her blood glucose every other day and her fasting blood glucose was 102 mg/dl yesterday. She denies hypoglycemic symptoms and states that taking gabapentin relieves her peripheral neuropathy. She states that her acid reflux is under control and her blood pressure is usually less than 140/90 when she checked it last week. She reports that her allergy is acting up and she reports minimal relief from taking Montelukast daily. Overall, she states that she's doing well and offers no further complaint.  Past Medical History:  Diagnosis Date  . Chronic kidney disease, stage III (moderate) 10/06/2017  . Diabetes mellitus without complication (HCC)   . GERD (gastroesophageal reflux disease)   . Hypercholesteremia   . Hypertension 09/10/2015    Past Surgical History:  Procedure Laterality Date  . APPENDECTOMY    . CESAREAN SECTION  1991  . COLONOSCOPY WITH PROPOFOL N/A 06/03/2019   Procedure: COLONOSCOPY WITH PROPOFOL;  Surgeon: Wyline Mood, MD;  Location: [facility] ENDOSCOPY;  Service: Gastroenterology;  Laterality: N/A;    Family History  Problem Relation Age of Onset  . COPD Mother   . Alzheimer's disease Mother   . Hypertension Father   . Hyperlipidemia Father   . Heart attack Father   . Diabetes type II Father   . Diabetes type II Sister   . Diabetes  type II Sister   . Gestational diabetes Daughter   . Diabetes type II Sister     Social History   Socioeconomic History  . Marital status: Married    Spouse name: Not on file  . Number of children: 4  . Years of education: Not on file  . Highest education level: 11th grade  Occupational History  . Occupation: na  Tobacco Use  . Smoking status: Former Smoker    Types: Cigarettes    Quit date: 04/17/2005    Years since quitting: 14.6  . Smokeless tobacco: Never Used  Substance and Sexual Activity  . Alcohol use: No    Alcohol/week: 14.0 standard drinks    Types: 14 Cans of beer per week    Comment: quit ~76mo ago  . Drug use: No  . Sexual activity: Not on file  Other Topics Concern  . Not on file  Social History Narrative   Lives in mobile home paid for   Social Determinants of Health   Financial Resource Strain:   . Difficulty of Paying Living Expenses:   Food Insecurity: Food Insecurity Present  . Worried About Programme researcher, broadcasting/film/video in the Last Year: Sometimes true  . Ran Out of Food in the Last Year: Sometimes true  Transportation Needs:   . Lack of Transportation (Medical):   Marland Kitchen Lack of Transportation (Non-Medical):   Physical Activity:   . Days of Exercise per Week:   . Minutes of Exercise per Session:   Stress:   .  Feeling of Stress :   Social Connections:   . Frequency of Communication with Friends and Family:   . Frequency of Social Gatherings with Friends and Family:   . Attends Religious Services:   . Active Member of Clubs or Organizations:   . Attends Archivist Meetings:   Marland Kitchen Marital Status:   Intimate Partner Violence:   . Fear of Current or Ex-Partner:   . Emotionally Abused:   Marland Kitchen Physically Abused:   . Sexually Abused:     Outpatient Medications Prior to Visit  Medication Sig Dispense Refill  . aspirin EC 81 MG tablet Take 1 tablet (81 mg total) by mouth daily.    . Dulaglutide (TRULICITY) 7.37 TG/6.2IR SOPN Inject 0.75 mg into the  skin once a week. 4 pen 4  . DULoxetine (CYMBALTA) 60 MG capsule Take 1 capsule (60 mg total) by mouth daily. 30 capsule 2  . ferrous sulfate 325 (65 FE) MG tablet TAKE ONE TABLET BY MOUTH EVERY DAY WITH BREAKFAST. 30 tablet 2  . montelukast (SINGULAIR) 10 MG tablet Take 1 tablet (10 mg total) by mouth at bedtime. 30 tablet 2  . nitroGLYCERIN (NITROSTAT) 0.4 MG SL tablet 1 TABLET UNDER TONGUE AS NEEDED FOR CHEST PAIN EVERY 5 MINUTES FOR MAX OF 3 DOSES IN 15 MINUTES; IF NO RELIEF AFTER 1ST DOSE CALL 911 25 tablet 0  . Omega-3 Fatty Acids (FISH OIL) 1000 MG CAPS Take 1,000 mg by mouth 2 (two) times daily.    Marland Kitchen omeprazole (PRILOSEC) 20 MG capsule TAKE ONE CAPSULE BY MOUTH 2 TIMES ADAY BEFORE A MEAL 120 capsule 1  . sodium chloride (OCEAN) 0.65 % SOLN nasal spray Place 1 spray into both nostrils as needed for congestion. 15 mL 0  . fluticasone (FLONASE) 50 MCG/ACT nasal spray Place 1 spray into both nostrils daily. 1 g 0  . gabapentin (NEURONTIN) 400 MG capsule TAKE ONE CAPSULE BY MOUTH 2 TIMES A DAY AND 2 CAPSULES AT BEDTIME 120 capsule 1  . insulin glargine (LANTUS) 100 UNIT/ML injection Inject 0.14 mLs (14 Units total) into the skin daily. 10 mL 1  . lisinopril (ZESTRIL) 10 MG tablet Take 1 tablet (10 mg total) by mouth daily. 90 tablet 0  . metFORMIN (GLUCOPHAGE) 1000 MG tablet Take 0.5 tablets (500 mg total) by mouth 2 (two) times daily with a meal. 60 tablet 1  . Metoprolol Tartrate 75 MG TABS Take 75 mg by mouth 2 (two) times daily. 180 tablet 1  . rosuvastatin (CRESTOR) 10 MG tablet TAKE 1/2 TABLET (5 mg) BY MOUTH EVERY DAY 30 tablet 2   Facility-Administered Medications Prior to Visit  Medication Dose Route Frequency Provider Last Rate Last Admin  . cyanocobalamin ((VITAMIN B-12)) injection 1,000 mcg  1,000 mcg Intramuscular Once Clydette Privitera E, NP        Allergies  Allergen Reactions  . Etodolac Rash  . Penicillin G Rash  . Penicillins Rash    Has patient had a PCN reaction  causing immediate rash, facial/tongue/throat swelling, SOB or lightheadedness with hypotension: Yes Has patient had a PCN reaction causing severe rash involving mucus membranes or skin necrosis: No Has patient had a PCN reaction that required hospitalization: No Has patient had a PCN reaction occurring within the last 10 years: No If all of the above answers are "NO", then may proceed with Cephalosporin use.    ROS Review of Systems  Constitutional: Negative.   HENT: Positive for sinus pressure and sneezing.   Eyes:  Negative.   Respiratory: Negative.   Cardiovascular: Negative.   Endocrine: Negative.   Neurological: Negative.       Objective:    Physical Exam  Constitutional: She is oriented to person, place, and time. She appears well-developed.  HENT:  Head: Normocephalic and atraumatic.  Cardiovascular: Normal rate and regular rhythm.  Pulmonary/Chest: Effort normal and breath sounds normal.  Neurological: She is alert and oriented to person, place, and time.  Psychiatric: She has a normal mood and affect. Her behavior is normal. Judgment and thought content normal.    BP 129/83 (BP Location: Left Arm, Patient Position: Sitting, Cuff Size: Large)   Pulse 81   Temp (!) 96.7 F (35.9 C)   Ht 5\' 2"  (1.575 m)   Wt 178 lb 9.6 oz (81 kg)   SpO2 97%   BMI 32.67 kg/m  Wt Readings from Last 3 Encounters:  11/20/19 179 lb 14.4 oz (81.6 kg)  11/20/19 178 lb 9.6 oz (81 kg)  10/02/19 186 lb (84.4 kg)   She was encouraged to continue on a weight loss regimen.  Health Maintenance Due  Topic Date Due  . Hepatitis C Screening  Never done  . HIV Screening  Never done  . PAP SMEAR-Modifier  Never done  . MAMMOGRAM  Never done  . OPHTHALMOLOGY EXAM  09/07/2019    There are no preventive care reminders to display for this patient.  Lab Results  Component Value Date   TSH 0.763 11/09/2017   Lab Results  Component Value Date   WBC 7.1 08/10/2019   HGB 14.9 08/10/2019    HCT 43.1 08/10/2019   MCV 87.6 08/10/2019   PLT 248 08/10/2019   Lab Results  Component Value Date   NA 142 09/25/2019   K 4.5 09/25/2019   CO2 24 09/25/2019   GLUCOSE 192 (H) 09/25/2019   BUN 15 09/25/2019   CREATININE 0.94 09/25/2019   BILITOT 0.3 06/05/2019   ALKPHOS 60 06/05/2019   AST 16 06/05/2019   ALT 14 06/05/2019   PROT 6.0 06/05/2019   ALBUMIN 3.8 06/05/2019   CALCIUM 9.0 09/25/2019   ANIONGAP 12 08/10/2019   Lab Results  Component Value Date   CHOL 125 08/28/2019   Lab Results  Component Value Date   HDL 42 08/28/2019   Lab Results  Component Value Date   LDLCALC 61 08/28/2019   Lab Results  Component Value Date   TRIG 125 08/28/2019   Lab Results  Component Value Date   CHOLHDL 3.0 08/28/2019   Lab Results  Component Value Date   HGBA1C 6.9 (H) 11/13/2019      Assessment & Plan:    1. Acute frontal sinusitis, recurrence not specified - She will continue on Zyrtec, was educated on medication side effects and to notify clinic. - fluticasone (FLONASE) 50 MCG/ACT nasal spray; Place 1 spray into both nostrils daily.  Dispense: 1 g; Refill: 1 - cetirizine (ZYRTEC) 10 MG tablet; Take 1 tablet (10 mg total) by mouth daily.  Dispense: 30 tablet; Refill: 0  2. Type 2 diabetes mellitus with diabetic neuropathy, with long-term current use of insulin (HCC) - Her HgbA1c was 6.9%, she will continue on Invokana 100 mg, Lantus was discontinued. She was advised to check fasting blood glucose daily, and goal should be between 80-130 mg/dl. She was advised to continue on low carb/non concentrated sweet diet and exercise daily. - gabapentin (NEURONTIN) 400 MG capsule; TAKE ONE CAPSULE BY MOUTH 2 TIMES A DAY AND 2  CAPSULES AT BEDTIME  Dispense: 120 capsule; Refill: 1 - metFORMIN (GLUCOPHAGE) 1000 MG tablet; Take 0.5 tablets (500 mg total) by mouth 2 (two) times daily with a meal.  Dispense: 60 tablet; Refill: 2 - canagliflozin (INVOKANA) 100 MG TABS tablet; Take 1  tablet (100 mg total) by mouth daily before breakfast.  Dispense: 30 tablet; Refill: 0 - She was advised to complete charity care application for Ambulatory referral to Ophthalmology   3 Hypercholesteremia - She will continue on current treatment regimen, and was advised to continue on low cholesterol/ fat diet. - rosuvastatin (CRESTOR) 10 MG tablet; TAKE 1/2 TABLET (5 mg) BY MOUTH EVERY DAY  Dispense: 30 tablet; Refill: 2  4. Hypertension, unspecified type - Her blood pressure is under control and was advised to continue on current medication, DASH diet and exercise as tolerated. - lisinopril (ZESTRIL) 10 MG tablet; Take 1 tablet (10 mg total) by mouth daily.  Dispense: 90 tablet; Refill: 0 - Metoprolol Tartrate 75 MG TABS; Take 75 mg by mouth 2 (two) times daily.  Dispense: 60 tablet; Refill: 2  . 5 B12 deficiency - She will continue on monthly Vitamin B 12 injections. - cyanocobalamin ((VITAMIN B-12)) injection 1,000 mcg    Follow-up: Return in about 4 weeks (around 12/18/2019), or if symptoms worsen or fail to improve.    Wilmoth Rasnic Trellis Paganini, NP

## 2019-11-20 NOTE — Patient Instructions (Signed)
   Managing Your Hypertension Hypertension is commonly called high blood pressure. This is when the force of your blood pressing against the walls of your arteries is too strong. Arteries are blood vessels that carry blood from your heart throughout your body. Hypertension forces the heart to work harder to pump blood, and may cause the arteries to become narrow or stiff. Having untreated or uncontrolled hypertension can cause heart attack, stroke, kidney disease, and other problems. What are blood pressure readings? A blood pressure reading consists of a higher number over a lower number. Ideally, your blood pressure should be below 120/80. The first ("top") number is called the systolic pressure. It is a measure of the pressure in your arteries as your heart beats. The second ("bottom") number is called the diastolic pressure. It is a measure of the pressure in your arteries as the heart relaxes. What does my blood pressure reading mean? Blood pressure is classified into four stages. Based on your blood pressure reading, your health care provider may use the following stages to determine what type of treatment you need, if any. Systolic pressure and diastolic pressure are measured in a unit called mm Hg. Normal  Systolic pressure: below 120.  Diastolic pressure: below 80. Elevated  Systolic pressure: 120-129.  Diastolic pressure: below 80. Hypertension stage 1  Systolic pressure: 130-139.  Diastolic pressure: 80-89. Hypertension stage 2  Systolic pressure: 140 or above.  Diastolic pressure: 90 or above. What health risks are associated with hypertension? Managing your hypertension is an important responsibility. Uncontrolled hypertension can lead to:  A heart attack.  A stroke.  A weakened blood vessel (aneurysm).  Heart failure.  Kidney damage.  Eye damage.  Metabolic syndrome.  Memory and concentration problems. What changes can I make to manage my  hypertension? Hypertension can be managed by making lifestyle changes and possibly by taking medicines. Your health care provider will help you make a plan to bring your blood pressure within a normal range. Eating and drinking   Eat a diet that is high in fiber and potassium, and low in salt (sodium), added sugar, and fat. An example eating plan is called the DASH (Dietary Approaches to Stop Hypertension) diet. To eat this way: ? Eat plenty of fresh fruits and vegetables. Try to fill half of your plate at each meal with fruits and vegetables. ? Eat whole grains, such as whole wheat pasta, brown rice, or whole grain bread. Fill about one quarter of your plate with whole grains. ? Eat low-fat diary products. ? Avoid fatty cuts of meat, processed or cured meats, and poultry with skin. Fill about one quarter of your plate with lean proteins such as fish, chicken without skin, beans, eggs, and tofu. ? Avoid premade and processed foods. These tend to be higher in sodium, added sugar, and fat.  Reduce your daily sodium intake. Most people with hypertension should eat less than 1,500 mg of sodium a day.  Limit alcohol intake to no more than 1 drink a day for nonpregnant women and 2 drinks a day for men. One drink equals 12 oz of beer, 5 oz of wine, or 1 oz of hard liquor. Lifestyle  Work with your health care provider to maintain a healthy body weight, or to lose weight. Ask what an ideal weight is for you.  Get at least 30 minutes of exercise that causes your heart to beat faster (aerobic exercise) most days of the week. Activities may include walking, swimming, or biking.    Include exercise to strengthen your muscles (resistance exercise), such as weight lifting, as part of your weekly exercise routine. Try to do these types of exercises for 30 minutes at least 3 days a week.  Do not use any products that contain nicotine or tobacco, such as cigarettes and e-cigarettes. If you need help quitting,  ask your health care provider.  Control any long-term (chronic) conditions you have, such as high cholesterol or diabetes. Monitoring  Monitor your blood pressure at home as told by your health care provider. Your personal target blood pressure may vary depending on your medical conditions, your age, and other factors.  Have your blood pressure checked regularly, as often as told by your health care provider. Working with your health care provider  Review all the medicines you take with your health care provider because there may be side effects or interactions.  Talk with your health care provider about your diet, exercise habits, and other lifestyle factors that may be contributing to hypertension.  Visit your health care provider regularly. Your health care provider can help you create and adjust your plan for managing hypertension. Will I need medicine to control my blood pressure? Your health care provider may prescribe medicine if lifestyle changes are not enough to get your blood pressure under control, and if:  Your systolic blood pressure is 130 or higher.  Your diastolic blood pressure is 80 or higher. Take medicines only as told by your health care provider. Follow the directions carefully. Blood pressure medicines must be taken as prescribed. The medicine does not work as well when you skip doses. Skipping doses also puts you at risk for problems. Contact a health care provider if:  You think you are having a reaction to medicines you have taken.  You have repeated (recurrent) headaches.  You feel dizzy.  You have swelling in your ankles.  You have trouble with your vision. Get help right away if:  You develop a severe headache or confusion.  You have unusual weakness or numbness, or you feel faint.  You have severe pain in your chest or abdomen.  You vomit repeatedly.  You have trouble breathing. Summary  Hypertension is when the force of blood pumping  through your arteries is too strong. If this condition is not controlled, it may put you at risk for serious complications.  Your personal target blood pressure may vary depending on your medical conditions, your age, and other factors. For most people, a normal blood pressure is less than 120/80.  Hypertension is managed by lifestyle changes, medicines, or both. Lifestyle changes include weight loss, eating a healthy, low-sodium diet, exercising more, and limiting alcohol. This information is not intended to replace advice given to you by your health care provider. Make sure you discuss any questions you have with your health care provider. Document Revised: 12/07/2018 Document Reviewed: 07/13/2016 Elsevier Patient Education  2020 [employer] Inc[employer]  

## 2019-11-25 LAB — IGP, APTIMA HPV: HPV Aptima: NEGATIVE

## 2019-11-27 ENCOUNTER — Ambulatory Visit
Admission: RE | Admit: 2019-11-27 | Discharge: 2019-11-27 | Disposition: A | Discharge status: Home / Self Care | Payer: Self-pay | Source: Ambulatory Visit | Attending: Oncology | Admitting: Oncology

## 2019-11-27 DIAGNOSIS — N644 Mastodynia: Secondary | ICD-10-CM

## 2019-11-27 DIAGNOSIS — Z0000 Encounter for general adult medical examination without abnormal findings: Secondary | ICD-10-CM

## 2019-12-03 ENCOUNTER — Other Ambulatory Visit: Payer: Self-pay

## 2019-12-05 ENCOUNTER — Ambulatory Visit: Payer: Self-pay | Admitting: Licensed Clinical Social Worker

## 2019-12-05 ENCOUNTER — Other Ambulatory Visit: Payer: Self-pay

## 2019-12-05 DIAGNOSIS — F3341 Major depressive disorder, recurrent, in partial remission: Secondary | ICD-10-CM

## 2019-12-05 DIAGNOSIS — F411 Generalized anxiety disorder: Secondary | ICD-10-CM

## 2019-12-05 MED ORDER — CYANOCOBALAMIN 1000 MCG/ML IJ SOLN: 1000.0000 ug | INTRAMUSCULAR | 12 refills | Status: DC

## 2019-12-05 NOTE — BH Specialist Note (Signed)
Integrated Behavioral Health Follow Up Visit Via Phone  MRN: [id number] Name: Renee Bush  Type of Service: Integrated Behavioral Health- Individual/Family Interpretor:No. Interpretor Name and Language: see above.  SUBJECTIVE: Renee Bush is a 59 y.o. female accompanied by herself. Patient was referred by self for mental health. Patient reports the following symptoms/concerns: She notes that they had a water leak so she has half a kitchen and her son is in the process of repairing it. She notes that the insurance is only paying so much for the repairs so they have had to fall back on their stimulus. She notes that she had her first COVID vaccine on Monday and is worried about getting the second one on April 26th. She notes that she has been a little anxious about the kitchen, her husband's health, and the COVID vaccine. She notes that some days are worse than others. She notes that her mood has been pretty good. She denies suicidal and homicidal thoughts.  Duration of problem: ; Severity of problem: mild  OBJECTIVE: Mood: Euthymic and Affect: Appropriate Risk of harm to self or others: No plan to harm self or others  LIFE CONTEXT: Family and Social: see above. School/Work: see above. Self-Care: see above. Life Changes: see above.  GOALS ADDRESSED: Patient will: 1.  Reduce symptoms of: stress  2.  Increase knowledge and/or ability of: coping skills and self-management skills  3.  Demonstrate ability to: Increase healthy adjustment to current life circumstances  INTERVENTIONS: Interventions utilized:  Supportive Counseling was utilized by the clinician during today's follow up session. Clinician processed with the patient regarding how she has been doing since the last follow up session. Clinician measured the patient's anxiety and depression on a numerical scale. Clinician explained to the patient that despite her stress that it sounds like she has been able to function.  Clinician encouraged the patient to continue to practice self care, utilize her coping skills, and take her psychotropic medications as prescribed. Standardized Assessments completed: GAD-7 and PHQ 9  ASSESSMENT: Patient currently experiencing see above.   Patient may benefit from see above.  PLAN: 1. Follow up with behavioral health clinician on :  2. Behavioral recommendations: see above. 3. Referral(s): Integrated [facility] (In Clinic) 4. "From scale of 1-10, how likely are you to follow plan?":   Althia Forts, LCSW

## 2019-12-09 ENCOUNTER — Other Ambulatory Visit: Payer: Self-pay

## 2019-12-09 MED ORDER — DULOXETINE HCL 60 MG PO CPEP: 60.0000 mg | ORAL_CAPSULE | Freq: Every day | ORAL | 2 refills | Status: DC

## 2019-12-16 ENCOUNTER — Other Ambulatory Visit: Payer: Self-pay | Admitting: Gerontology

## 2019-12-16 DIAGNOSIS — Z889 Allergy status to unspecified drugs, medicaments and biological substances status: Secondary | ICD-10-CM

## 2019-12-18 ENCOUNTER — Ambulatory Visit: Payer: Self-pay | Admitting: Gerontology

## 2019-12-18 ENCOUNTER — Encounter: Payer: Self-pay | Admitting: Gerontology

## 2019-12-18 ENCOUNTER — Other Ambulatory Visit: Payer: Self-pay

## 2019-12-18 VITALS — BP 114/80 | HR 78 | Temp 99.0°F | Ht 63.0 in | Wt 181.8 lb

## 2019-12-18 DIAGNOSIS — E538 Deficiency of other specified B group vitamins: Secondary | ICD-10-CM

## 2019-12-18 DIAGNOSIS — J0110 Acute frontal sinusitis, unspecified: Secondary | ICD-10-CM

## 2019-12-18 DIAGNOSIS — R2689 Other abnormalities of gait and mobility: Secondary | ICD-10-CM

## 2019-12-18 DIAGNOSIS — E1140 Type 2 diabetes mellitus with diabetic neuropathy, unspecified: Secondary | ICD-10-CM

## 2019-12-18 DIAGNOSIS — Z794 Long term (current) use of insulin: Secondary | ICD-10-CM

## 2019-12-18 DIAGNOSIS — R399 Unspecified symptoms and signs involving the genitourinary system: Secondary | ICD-10-CM | POA: Insufficient documentation

## 2019-12-18 HISTORY — DX: Other abnormalities of gait and mobility: R26.89

## 2019-12-18 MED ORDER — CANAGLIFLOZIN 100 MG PO TABS: 100.0000 mg | ORAL_TABLET | Freq: Every day | ORAL | 2 refills | Status: DC

## 2019-12-18 MED ORDER — CETIRIZINE HCL 10 MG PO TABS: 10.0000 mg | ORAL_TABLET | Freq: Every day | ORAL | 2 refills | Status: DC

## 2019-12-18 NOTE — Progress Notes (Addendum)
Phoned patient to notify of normal mammogram, and pap smear results. Patient  Instructed to return for annual screening.  Next pap due in 5 years.  Copy to [facility].

## 2019-12-18 NOTE — Progress Notes (Signed)
Established Patient Office Visit  Subjective:  Patient ID: Renee Bush, female    DOB: 02-May-1961  Age: 59 y.o. MRN: [id number]  CC:  Chief Complaint  Patient presents with  . Follow-up    suspected kidney infection, frequency and right lower back pain    HPI Renee Bush presents for follow up of Type 2 Diabetes Mellitus, Vit B-12 deficiency and medication refill. Her HgbA1c was 6.9% and she states that she's tolerating Invokana, monitors her blood glucose 3 times weekly. It was 175 mg/dl during her clinic visit. She denies hypoglycemic symptoms. She states that her balance is off, and it started last week. She denies light headedness, dizziness, vertigo, otalgia, tinnitus , blurry vision, headache and fall. Currently she's c/o urinary frequency, urgency, right flank pain, but denies fever and chills. She states that urinary infection symptoms started last week. Overall she states that she's doing well and offers no further complaint.   Past Medical History:  Diagnosis Date  . Chronic kidney disease, stage III (moderate) 10/06/2017  . Diabetes mellitus without complication ([facility])   . GERD (gastroesophageal reflux disease)   . Hypercholesteremia   . Hypertension 09/10/2015    Past Surgical History:  Procedure Laterality Date  . APPENDECTOMY    . CESAREAN SECTION  1991  . COLONOSCOPY WITH PROPOFOL N/A 06/03/2019   Procedure: COLONOSCOPY WITH PROPOFOL;  Surgeon: Jonathon Bellows, MD;  Location: [facility] ENDOSCOPY;  Service: Gastroenterology;  Laterality: N/A;    Family History  Problem Relation Age of Onset  . COPD Mother   . Alzheimer's disease Mother   . Hypertension Father   . Hyperlipidemia Father   . Heart attack Father   . Diabetes type II Father   . Diabetes type II Sister   . Diabetes type II Sister   . Gestational diabetes Daughter   . Diabetes type II Sister     Social History   Socioeconomic History  . Marital status: Married    Spouse name: Not on file   . Number of children: 4  . Years of education: Not on file  . Highest education level: 11th grade  Occupational History  . Occupation: na  Tobacco Use  . Smoking status: Former Smoker    Types: Cigarettes    Quit date: 04/17/2005    Years since quitting: 14.6  . Smokeless tobacco: Never Used  Substance and Sexual Activity  . Alcohol use: No    Alcohol/week: 14.0 standard drinks    Types: 14 Cans of beer per week    Comment: quit ~45moago  . Drug use: No  . Sexual activity: Not on file  Other Topics Concern  . Not on file  Social History Narrative   Lives in mobile home paid for   Social Determinants of Health   Financial Resource Strain:   . Difficulty of Paying Living Expenses:   Food Insecurity:   . Worried About RCharity fundraiserin the Last Year:   . RArboriculturistin the Last Year:   Transportation Needs:   . LFilm/video editor(Medical):   .Marland KitchenLack of Transportation (Non-Medical):   Physical Activity:   . Days of Exercise per Week:   . Minutes of Exercise per Session:   Stress:   . Feeling of Stress :   Social Connections:   . Frequency of Communication with Friends and Family:   . Frequency of Social Gatherings with Friends and Family:   . Attends Religious Services:   .  Active Member of Clubs or Organizations:   . Attends Archivist Meetings:   Marland Kitchen Marital Status:   Intimate Partner Violence:   . Fear of Current or Ex-Partner:   . Emotionally Abused:   Marland Kitchen Physically Abused:   . Sexually Abused:     Outpatient Medications Prior to Visit  Medication Sig Dispense Refill  . aspirin EC 81 MG tablet Take 1 tablet (81 mg total) by mouth daily.    . cyanocobalamin (,VITAMIN B-12,) 1000 MCG/ML injection Inject 1 mL (1,000 mcg total) into the muscle every 30 (thirty) days. 1 mL 12  . Dulaglutide (TRULICITY) 5.68 LE/7.5TZ SOPN Inject 0.75 mg into the skin once a week. 4 pen 4  . DULoxetine (CYMBALTA) 60 MG capsule Take 1 capsule (60 mg total) by  mouth daily. 30 capsule 2  . ferrous sulfate 325 (65 FE) MG tablet TAKE ONE TABLET BY MOUTH EVERY DAY WITH BREAKFAST. 30 tablet 2  . fluticasone (FLONASE) 50 MCG/ACT nasal spray Place 1 spray into both nostrils daily. 1 g 1  . gabapentin (NEURONTIN) 400 MG capsule TAKE ONE CAPSULE BY MOUTH 2 TIMES A DAY AND 2 CAPSULES AT BEDTIME 120 capsule 1  . lisinopril (ZESTRIL) 10 MG tablet Take 1 tablet (10 mg total) by mouth daily. 90 tablet 0  . metFORMIN (GLUCOPHAGE) 1000 MG tablet Take 0.5 tablets (500 mg total) by mouth 2 (two) times daily with a meal. 60 tablet 2  . Metoprolol Tartrate 75 MG TABS Take 75 mg by mouth 2 (two) times daily. 60 tablet 2  . montelukast (SINGULAIR) 10 MG tablet Take 1 tablet (10 mg total) by mouth at bedtime. 90 tablet 0  . nitroGLYCERIN (NITROSTAT) 0.4 MG SL tablet 1 TABLET UNDER TONGUE AS NEEDED FOR CHEST PAIN EVERY 5 MINUTES FOR MAX OF 3 DOSES IN 15 MINUTES; IF NO RELIEF AFTER 1ST DOSE CALL 911 25 tablet 0  . Omega-3 Fatty Acids (FISH OIL) 1000 MG CAPS Take 1,000 mg by mouth 2 (two) times daily.    Marland Kitchen omeprazole (PRILOSEC) 20 MG capsule TAKE ONE CAPSULE BY MOUTH 2 TIMES ADAY BEFORE A MEAL 120 capsule 1  . rosuvastatin (CRESTOR) 10 MG tablet TAKE 1/2 TABLET (5 mg) BY MOUTH EVERY DAY 30 tablet 2  . sodium chloride (OCEAN) 0.65 % SOLN nasal spray Place 1 spray into both nostrils as needed for congestion. 15 mL 0  . canagliflozin (INVOKANA) 100 MG TABS tablet Take 1 tablet (100 mg total) by mouth daily before breakfast. 30 tablet 0  . cetirizine (ZYRTEC) 10 MG tablet Take 1 tablet (10 mg total) by mouth daily. 30 tablet 0   No facility-administered medications prior to visit.    Allergies  Allergen Reactions  . Etodolac Rash  . Penicillin G Rash  . Penicillins Rash    Has patient had a PCN reaction causing immediate rash, facial/tongue/throat swelling, SOB or lightheadedness with hypotension: Yes Has patient had a PCN reaction causing severe rash involving mucus membranes  or skin necrosis: No Has patient had a PCN reaction that required hospitalization: No Has patient had a PCN reaction occurring within the last 10 years: No If all of the above answers are "NO", then may proceed with Cephalosporin use.    ROS Review of Systems  Constitutional: Negative.   Eyes: Negative.   Respiratory: Negative.   Cardiovascular: Negative.   Endocrine: Negative.   Genitourinary: Positive for flank pain, frequency and urgency. Negative for dysuria.  Neurological: Negative.   Psychiatric/Behavioral: Negative.  Objective:    Physical Exam  Constitutional: She is oriented to person, place, and time. She appears well-developed.  HENT:  Head: Normocephalic and atraumatic.  Right Ear: Hearing, tympanic membrane, external ear and ear canal normal.  Left Ear: Hearing, tympanic membrane, external ear and ear canal normal.  Eyes: Pupils are equal, round, and reactive to light. EOM are normal.  Cardiovascular: Normal rate and regular rhythm.  Pulmonary/Chest: Effort normal and breath sounds normal.  Abdominal: There is CVA tenderness (right flank pain).  Neurological: She is alert and oriented to person, place, and time.  Psychiatric: She has a normal mood and affect. Her behavior is normal. Judgment and thought content normal.    BP 114/80 (BP Location: Left Arm, Patient Position: Sitting, Cuff Size: Large)   Pulse 78   Temp 99 F (37.2 C)   Ht '5\' 3"'$  (1.6 m)   Wt 181 lb 12.8 oz (82.5 kg)   SpO2 97%   BMI 32.20 kg/m  Wt Readings from Last 3 Encounters:  12/18/19 181 lb 12.8 oz (82.5 kg)  11/20/19 179 lb 14.4 oz (81.6 kg)  11/20/19 178 lb 9.6 oz (81 kg)     Health Maintenance Due  Topic Date Due  . Hepatitis C Screening  Never done  . HIV Screening  Never done  . COVID-19 Vaccine (1) Never done  . MAMMOGRAM  Never done  . OPHTHALMOLOGY EXAM  09/07/2019    There are no preventive care reminders to display for this patient.  Lab Results  Component  Value Date   TSH 0.763 11/09/2017   Lab Results  Component Value Date   WBC 7.1 08/10/2019   HGB 14.9 08/10/2019   HCT 43.1 08/10/2019   MCV 87.6 08/10/2019   PLT 248 08/10/2019   Lab Results  Component Value Date   NA 142 09/25/2019   K 4.5 09/25/2019   CO2 24 09/25/2019   GLUCOSE 192 (H) 09/25/2019   BUN 15 09/25/2019   CREATININE 0.94 09/25/2019   BILITOT 0.3 06/05/2019   ALKPHOS 60 06/05/2019   AST 16 06/05/2019   ALT 14 06/05/2019   PROT 6.0 06/05/2019   ALBUMIN 3.8 06/05/2019   CALCIUM 9.0 09/25/2019   ANIONGAP 12 08/10/2019   Lab Results  Component Value Date   CHOL 125 08/28/2019   Lab Results  Component Value Date   HDL 42 08/28/2019   Lab Results  Component Value Date   LDLCALC 61 08/28/2019   Lab Results  Component Value Date   TRIG 125 08/28/2019   Lab Results  Component Value Date   CHOLHDL 3.0 08/28/2019   Lab Results  Component Value Date   HGBA1C 6.9 (H) 11/13/2019      Assessment & Plan:    1. Type 2 diabetes mellitus with diabetic neuropathy, with long-term current use of insulin ([facility]) - Her HgbA1c was 6.9%, she will continue on current treatment regimen, was advised to continue on low carb/non concentrated sweet diet. - canagliflozin (INVOKANA) 100 MG TABS tablet; Take 1 tablet (100 mg total) by mouth daily before breakfast.  Dispense: 30 tablet; Refill: 2  2. Acute frontal sinusitis, recurrence not specified - She will continue on current treatment regimen. - cetirizine (ZYRTEC) 10 MG tablet; Take 1 tablet (10 mg total) by mouth daily.  Dispense: 30 tablet; Refill: 2  3. B12 deficiency - Vitamin B 12 injection will be administered on 12/19/2019.  4. Urinary tract infection symptoms - Urine will be checked to rule out UTI. -  Urinalysis; Future - UA/M w/rflx Culture, Routine; Future - CBC w/Diff; Future - CBC w/Diff - Urinalysis - UA/M w/rflx Culture, Routine  5. Balance problem - She was advised to check her blood  pressure, record and bring log to follow up appointment, might consider decreasing some of her anti hypertensive medications. She was advised to notify clinic with worsening symptoms. - Comp Met (CMET); Future - CBC w/Diff; Future    Follow-up: Return in about 22 days (around 01/09/2020), or if symptoms worsen or fail to improve.    Renee Yanke Jerold Coombe, NP

## 2019-12-19 ENCOUNTER — Other Ambulatory Visit: Payer: Self-pay

## 2019-12-19 ENCOUNTER — Other Ambulatory Visit: Payer: Self-pay | Admitting: Gerontology

## 2019-12-19 DIAGNOSIS — R399 Unspecified symptoms and signs involving the genitourinary system: Secondary | ICD-10-CM

## 2019-12-19 MED ORDER — NITROFURANTOIN MONOHYD MACRO 100 MG PO CAPS: 100.0000 mg | ORAL_CAPSULE | Freq: Two times a day (BID) | ORAL | 0 refills | Status: DC

## 2019-12-21 LAB — CBC WITH DIFFERENTIAL/PLATELET
Basophils Absolute: 0 10*3/uL (ref 0.0–0.2)
Basos: 0 %
EOS (ABSOLUTE): 0.1 10*3/uL (ref 0.0–0.4)
Eos: 1 %
Hematocrit: 40.6 % (ref 34.0–46.6)
Hemoglobin: 13.8 g/dL (ref 11.1–15.9)
Immature Grans (Abs): 0 10*3/uL (ref 0.0–0.1)
Immature Granulocytes: 1 %
Lymphocytes Absolute: 1.8 10*3/uL (ref 0.7–3.1)
Lymphs: 23 %
MCH: 31.2 pg (ref 26.6–33.0)
MCHC: 34 g/dL (ref 31.5–35.7)
MCV: 92 fL (ref 79–97)
Monocytes Absolute: 0.5 10*3/uL (ref 0.1–0.9)
Monocytes: 7 %
Neutrophils Absolute: 5.3 10*3/uL (ref 1.4–7.0)
Neutrophils: 68 %
Platelets: 209 10*3/uL (ref 150–450)
RBC: 4.42 x10E6/uL (ref 3.77–5.28)
RDW: 12.8 % (ref 11.7–15.4)
WBC: 7.8 10*3/uL (ref 3.4–10.8)

## 2019-12-21 LAB — UA/M W/RFLX CULTURE, ROUTINE
Bilirubin, UA: NEGATIVE
Ketones, UA: NEGATIVE
Nitrite, UA: NEGATIVE
Protein,UA: NEGATIVE
RBC, UA: NEGATIVE
Specific Gravity, UA: 1.028 (ref 1.005–1.030)
Urobilinogen, Ur: 0.2 mg/dL (ref 0.2–1.0)
pH, UA: 5 (ref 5.0–7.5)

## 2019-12-21 LAB — COMPREHENSIVE METABOLIC PANEL
ALT: 10 IU/L (ref 0–32)
AST: 10 IU/L (ref 0–40)
Albumin/Globulin Ratio: 2.1 (ref 1.2–2.2)
Albumin: 4.2 g/dL (ref 3.8–4.9)
Alkaline Phosphatase: 64 IU/L (ref 39–117)
BUN/Creatinine Ratio: 21 (ref 9–23)
BUN: 25 mg/dL — ABNORMAL HIGH (ref 6–24)
Bilirubin Total: 0.3 mg/dL (ref 0.0–1.2)
CO2: 23 mmol/L (ref 20–29)
Calcium: 9.1 mg/dL (ref 8.7–10.2)
Chloride: 104 mmol/L (ref 96–106)
Creatinine, Ser: 1.18 mg/dL — ABNORMAL HIGH (ref 0.57–1.00)
GFR calc Af Amer: 58 mL/min/{1.73_m2} — ABNORMAL LOW (ref >59)
GFR calc non Af Amer: 51 mL/min/{1.73_m2} — ABNORMAL LOW (ref >59)
Globulin, Total: 2 g/dL (ref 1.5–4.5)
Glucose: 202 mg/dL — ABNORMAL HIGH (ref 65–99)
Potassium: 4.5 mmol/L (ref 3.5–5.2)
Sodium: 140 mmol/L (ref 134–144)
Total Protein: 6.2 g/dL (ref 6.0–8.5)

## 2019-12-21 LAB — MICROSCOPIC EXAMINATION
Casts: NONE SEEN /lpf
RBC, Urine: NONE SEEN /hpf (ref 0–2)

## 2019-12-21 LAB — URINE CULTURE, REFLEX

## 2019-12-24 ENCOUNTER — Telehealth: Payer: Self-pay | Admitting: Pharmacist

## 2019-12-24 NOTE — Telephone Encounter (Signed)
12/24/2019 8:09:51 AM - Lantus refill to provider  -- [name] - Tuesday, December 24, 2019 8:09 AM --Printed [employer] refill request for Lantus Solostar Inject 14 units into the skin once daily # 1 box.

## 2020-01-09 ENCOUNTER — Other Ambulatory Visit: Payer: Self-pay

## 2020-01-09 ENCOUNTER — Other Ambulatory Visit: Payer: Self-pay | Admitting: Gerontology

## 2020-01-09 ENCOUNTER — Encounter: Payer: Self-pay | Admitting: Gerontology

## 2020-01-09 ENCOUNTER — Ambulatory Visit: Payer: Self-pay | Admitting: Gerontology

## 2020-01-09 VITALS — BP 116/75 | HR 78 | Ht 62.0 in | Wt 181.0 lb

## 2020-01-09 DIAGNOSIS — Z794 Long term (current) use of insulin: Secondary | ICD-10-CM

## 2020-01-09 DIAGNOSIS — B379 Candidiasis, unspecified: Secondary | ICD-10-CM

## 2020-01-09 DIAGNOSIS — T3695XA Adverse effect of unspecified systemic antibiotic, initial encounter: Secondary | ICD-10-CM | POA: Insufficient documentation

## 2020-01-09 DIAGNOSIS — E1140 Type 2 diabetes mellitus with diabetic neuropathy, unspecified: Secondary | ICD-10-CM

## 2020-01-09 DIAGNOSIS — R899 Unspecified abnormal finding in specimens from other organs, systems and tissues: Secondary | ICD-10-CM

## 2020-01-09 DIAGNOSIS — I10 Essential (primary) hypertension: Secondary | ICD-10-CM

## 2020-01-09 DIAGNOSIS — R2689 Other abnormalities of gait and mobility: Secondary | ICD-10-CM

## 2020-01-09 HISTORY — DX: Candidiasis, unspecified: B37.9

## 2020-01-09 MED ORDER — METFORMIN HCL 1000 MG PO TABS: 500.0000 mg | ORAL_TABLET | Freq: Two times a day (BID) | ORAL | 2 refills | Status: DC

## 2020-01-09 MED ORDER — FERROUS SULFATE 325 (65 FE) MG PO TABS: ORAL_TABLET | ORAL | 2 refills | Status: DC

## 2020-01-09 MED ORDER — FLUCONAZOLE 100 MG PO TABS: 100.0000 mg | ORAL_TABLET | Freq: Every day | ORAL | 0 refills | Status: DC

## 2020-01-09 MED ORDER — LISINOPRIL 10 MG PO TABS: 10.0000 mg | ORAL_TABLET | Freq: Every day | ORAL | 0 refills | Status: DC

## 2020-01-09 NOTE — Progress Notes (Signed)
Established Patient Office Visit  Subjective:  Patient ID: Renee Bush, female    DOB: May 31, 1961  Age: 59 y.o. MRN: [id number]  CC:  Chief Complaint  Patient presents with  . Diabetes  . Urinary Tract Infection    HPI Renee Bush presents for follow up of balance problem, lab review and medication refill. She finished antibiotics course for UTI and currently, she denies dysuria, urinary frequency, urgency, flank and pelvic pain, but states that she has  Whitish vaginal discharge. She states that her balance problem has improved, and she denies vertigo, dizziness, otalgia and fall. She states that she forgot her blood pressure log, and she can't remember her readings. Her  Serum creatinine done on 12/18/2019 was 1.18 mg/dl, eGFR for non Af Amer 51 and eGFR for Af Amer was 58. She states that she has increased her water intake. Overall, she states that's she's doing well and offers no further complaint.  Past Medical History:  Diagnosis Date  . Chronic kidney disease, stage III (moderate) 10/06/2017  . Diabetes mellitus without complication ([place])   . GERD (gastroesophageal reflux disease)   . Hypercholesteremia   . Hypertension 09/10/2015    Past Surgical History:  Procedure Laterality Date  . APPENDECTOMY    . CESAREAN SECTION  1991  . COLONOSCOPY WITH PROPOFOL N/A 06/03/2019   Procedure: COLONOSCOPY WITH PROPOFOL;  Surgeon: Jonathon Bellows, MD;  Location: [facility] ENDOSCOPY;  Service: Gastroenterology;  Laterality: N/A;    Family History  Problem Relation Age of Onset  . COPD Mother   . Alzheimer's disease Mother   . Hypertension Father   . Hyperlipidemia Father   . Heart attack Father   . Diabetes type II Father   . Diabetes type II Sister   . Diabetes type II Sister   . Gestational diabetes Daughter   . Diabetes type II Sister     Social History   Socioeconomic History  . Marital status: Married    Spouse name: Not on file  . Number of children: 4  . Years  of education: Not on file  . Highest education level: 11th grade  Occupational History  . Occupation: na  Tobacco Use  . Smoking status: Former Smoker    Types: Cigarettes    Quit date: 04/17/2005    Years since quitting: 14.7  . Smokeless tobacco: Never Used  Substance and Sexual Activity  . Alcohol use: No    Alcohol/week: 14.0 standard drinks    Types: 14 Cans of beer per week    Comment: quit ~92moago  . Drug use: No  . Sexual activity: Not on file  Other Topics Concern  . Not on file  Social History Narrative   Lives in mobile home paid for   Social Determinants of Health   Financial Resource Strain:   . Difficulty of Paying Living Expenses:   Food Insecurity:   . Worried About RCharity fundraiserin the Last Year:   . RArboriculturistin the Last Year:   Transportation Needs:   . LFilm/video editor(Medical):   .Marland KitchenLack of Transportation (Non-Medical):   Physical Activity:   . Days of Exercise per Week:   . Minutes of Exercise per Session:   Stress:   . Feeling of Stress :   Social Connections:   . Frequency of Communication with Friends and Family:   . Frequency of Social Gatherings with Friends and Family:   . Attends Religious Services:   .  Active Member of Clubs or Organizations:   . Attends Archivist Meetings:   Marland Kitchen Marital Status:   Intimate Partner Violence:   . Fear of Current or Ex-Partner:   . Emotionally Abused:   Marland Kitchen Physically Abused:   . Sexually Abused:     Outpatient Medications Prior to Visit  Medication Sig Dispense Refill  . aspirin EC 81 MG tablet Take 1 tablet (81 mg total) by mouth daily.    . canagliflozin (INVOKANA) 100 MG TABS tablet Take 1 tablet (100 mg total) by mouth daily before breakfast. 30 tablet 2  . cetirizine (ZYRTEC) 10 MG tablet Take 1 tablet (10 mg total) by mouth daily. 30 tablet 2  . cyanocobalamin (,VITAMIN B-12,) 1000 MCG/ML injection Inject 1 mL (1,000 mcg total) into the muscle every 30 (thirty) days. 1  mL 12  . Dulaglutide (TRULICITY) 7.86 VE/7.2CN SOPN Inject 0.75 mg into the skin once a week. 4 pen 4  . DULoxetine (CYMBALTA) 60 MG capsule Take 1 capsule (60 mg total) by mouth daily. 30 capsule 2  . fluticasone (FLONASE) 50 MCG/ACT nasal spray Place 1 spray into both nostrils daily. 1 g 1  . gabapentin (NEURONTIN) 400 MG capsule TAKE ONE CAPSULE BY MOUTH 2 TIMES A DAY AND 2 CAPSULES AT BEDTIME 120 capsule 1  . Metoprolol Tartrate 75 MG TABS Take 75 mg by mouth 2 (two) times daily. 60 tablet 2  . montelukast (SINGULAIR) 10 MG tablet Take 1 tablet (10 mg total) by mouth at bedtime. 90 tablet 0  . nitroGLYCERIN (NITROSTAT) 0.4 MG SL tablet 1 TABLET UNDER TONGUE AS NEEDED FOR CHEST PAIN EVERY 5 MINUTES FOR MAX OF 3 DOSES IN 15 MINUTES; IF NO RELIEF AFTER 1ST DOSE CALL 911 25 tablet 0  . Omega-3 Fatty Acids (FISH OIL) 1000 MG CAPS Take 1,000 mg by mouth 2 (two) times daily.    Marland Kitchen omeprazole (PRILOSEC) 20 MG capsule TAKE ONE CAPSULE BY MOUTH 2 TIMES ADAY BEFORE A MEAL 120 capsule 1  . rosuvastatin (CRESTOR) 10 MG tablet TAKE 1/2 TABLET (5 mg) BY MOUTH EVERY DAY 30 tablet 2  . sodium chloride (OCEAN) 0.65 % SOLN nasal spray Place 1 spray into both nostrils as needed for congestion. 15 mL 0  . ferrous sulfate 325 (65 FE) MG tablet TAKE ONE TABLET BY MOUTH EVERY DAY WITH BREAKFAST. 30 tablet 2  . lisinopril (ZESTRIL) 10 MG tablet Take 1 tablet (10 mg total) by mouth daily. 90 tablet 0  . metFORMIN (GLUCOPHAGE) 1000 MG tablet Take 0.5 tablets (500 mg total) by mouth 2 (two) times daily with a meal. 60 tablet 2  . nitrofurantoin, macrocrystal-monohydrate, (MACROBID) 100 MG capsule Take 1 capsule (100 mg total) by mouth 2 (two) times daily. 20 capsule 0   No facility-administered medications prior to visit.    Allergies  Allergen Reactions  . Etodolac Rash  . Penicillin G Rash  . Penicillins Rash    Has patient had a PCN reaction causing immediate rash, facial/tongue/throat swelling, SOB or  lightheadedness with hypotension: Yes Has patient had a PCN reaction causing severe rash involving mucus membranes or skin necrosis: No Has patient had a PCN reaction that required hospitalization: No Has patient had a PCN reaction occurring within the last 10 years: No If all of the above answers are "NO", then may proceed with Cephalosporin use.    ROS Review of Systems  Constitutional: Negative.   HENT: Negative.   Eyes: Negative.   Respiratory: Negative.  Cardiovascular: Negative.   Genitourinary: Negative.   Neurological: Negative.   Psychiatric/Behavioral: Negative.       Objective:    Physical Exam  Constitutional: She is oriented to person, place, and time. She appears well-developed.  HENT:  Head: Normocephalic and atraumatic.  Eyes: Pupils are equal, round, and reactive to light. EOM are normal.  Cardiovascular: Normal rate and regular rhythm.  Pulmonary/Chest: Effort normal and breath sounds normal.  Neurological: She is alert and oriented to person, place, and time.  Psychiatric: She has a normal mood and affect. Her behavior is normal. Judgment and thought content normal.    BP 116/75 (BP Location: Left Arm, Patient Position: Sitting)   Pulse 78   Ht '5\' 2"'$  (1.575 m)   Wt 181 lb (82.1 kg)   SpO2 98%   BMI 33.11 kg/m  Wt Readings from Last 3 Encounters:  01/09/20 181 lb (82.1 kg)  12/18/19 181 lb 12.8 oz (82.5 kg)  11/20/19 179 lb 14.4 oz (81.6 kg)   She was encouraged to continue on her weight loss regimen.  Health Maintenance Due  Topic Date Due  . Hepatitis C Screening  Never done  . HIV Screening  Never done  . MAMMOGRAM  Never done  . OPHTHALMOLOGY EXAM  09/07/2019    There are no preventive care reminders to display for this patient.  Lab Results  Component Value Date   TSH 0.763 11/09/2017   Lab Results  Component Value Date   WBC 7.8 12/18/2019   HGB 13.8 12/18/2019   HCT 40.6 12/18/2019   MCV 92 12/18/2019   PLT 209 12/18/2019    Lab Results  Component Value Date   NA 140 12/18/2019   K 4.5 12/18/2019   CO2 23 12/18/2019   GLUCOSE 202 (H) 12/18/2019   BUN 25 (H) 12/18/2019   CREATININE 1.18 (H) 12/18/2019   BILITOT 0.3 12/18/2019   ALKPHOS 64 12/18/2019   AST 10 12/18/2019   ALT 10 12/18/2019   PROT 6.2 12/18/2019   ALBUMIN 4.2 12/18/2019   CALCIUM 9.1 12/18/2019   ANIONGAP 12 08/10/2019   Lab Results  Component Value Date   CHOL 125 08/28/2019   Lab Results  Component Value Date   HDL 42 08/28/2019   Lab Results  Component Value Date   LDLCALC 61 08/28/2019   Lab Results  Component Value Date   TRIG 125 08/28/2019   Lab Results  Component Value Date   CHOLHDL 3.0 08/28/2019   Lab Results  Component Value Date   HGBA1C 6.9 (H) 11/13/2019      Assessment & Plan:   1. Hypertension, unspecified type - Her blood pressure is under control and she will continue on current treatment regimen. She was advised to continue on DASH diet and exercise as tolerated. - lisinopril (ZESTRIL) 10 MG tablet; Take 1 tablet (10 mg total) by mouth daily. Dispense: 90 tablet; Refill: 0 - Basic Metabolic Panel (BMET); Future  2. Type 2 diabetes mellitus with diabetic neuropathy, with long-term current use of insulin ([place]) - She was advised to continue continue on current treatment regimen, check blood glucose as ordered, record and bring log to follow up appointment, continue on low carb/non concentrated sweet diet and exercise as tolerated. - metFORMIN (GLUCOPHAGE) 1000 MG tablet; Take 0.5 tablets (500 mg total) by mouth 2 (two) times daily with a meal.  Dispense: 60 tablet; Refill: 2 - ferrous sulfate 325 (65 FE) MG tablet; TAKE ONE TABLET BY MOUTH EVERY DAY WITH  BREAKFAST.  Dispense: 30 tablet; Refill: 2 - HgB A1c; Future - Basic Metabolic Panel (BMET); Future  3. Balance problem - She was advised to notify clinic or go to the ED with worsening symptoms.  4. Antibiotic-induced yeast infection - Will  recheck urine, educated on medication side effects and advised to notify clinic. She was advised to perform proper perineal care. - fluconazole (DIFLUCAN) 100 MG tablet; Take 1 tablet (100 mg total) by mouth daily.  Dispense: 2 tablet; Refill: 0 - Urinalysis; Future - UA/M w/rflx Culture, Routine; Future   5. Abnormal laboratory test result -Will recheck renal function and was advised to increase water intake. - Basic Metabolic Panel (BMET); Future   Follow-up: Return in about 6 weeks (around 02/19/2020), or if symptoms worsen or fail to improve.    Renee Riding Jerold Coombe, NP

## 2020-01-09 NOTE — Patient Instructions (Signed)
Carbohydrate Counting for Diabetes Mellitus, Adult  Carbohydrate counting is a method of keeping track of how many carbohydrates you eat. Eating carbohydrates naturally increases the amount of sugar (glucose) in the blood. Counting how many carbohydrates you eat helps keep your blood glucose within normal limits, which helps you manage your diabetes (diabetes mellitus). It is important to know how many carbohydrates you can safely have in each meal. This is different for every person. A diet and nutrition specialist (registered dietitian) can help you make a meal plan and calculate how many carbohydrates you should have at each meal and snack. Carbohydrates are found in the following foods:  Grains, such as breads and cereals.  Dried beans and soy products.  Starchy vegetables, such as potatoes, peas, and corn.  Fruit and fruit juices.  Milk and yogurt.  Sweets and snack foods, such as cake, cookies, candy, chips, and soft drinks. How do I count carbohydrates? There are two ways to count carbohydrates in food. You can use either of the methods or a combination of both. Reading "Nutrition Facts" on packaged food The "Nutrition Facts" list is included on the labels of almost all packaged foods and beverages in the [place] It includes:  The serving size.  Information about nutrients in each serving, including the grams (g) of carbohydrate per serving. To use the "Nutrition Facts":  Decide how many servings you will have.  Multiply the number of servings by the number of carbohydrates per serving.  The resulting number is the total amount of carbohydrates that you will be having. Learning standard serving sizes of other foods When you eat carbohydrate foods that are not packaged or do not include "Nutrition Facts" on the label, you need to measure the servings in order to count the amount of carbohydrates:  Measure the foods that you will eat with a food scale or measuring cup, if  needed.  Decide how many standard-size servings you will eat.  Multiply the number of servings by 15. Most carbohydrate-rich foods have about 15 g of carbohydrates per serving. ? For example, if you eat 8 oz (170 g) of strawberries, you will have eaten 2 servings and 30 g of carbohydrates (2 servings x 15 g = 30 g).  For foods that have more than one food mixed, such as soups and casseroles, you must count the carbohydrates in each food that is included. The following list contains standard serving sizes of common carbohydrate-rich foods. Each of these servings has about 15 g of carbohydrates:   hamburger bun or  English muffin.   oz (15 mL) syrup.   oz (14 g) jelly.  1 slice of bread.  1 six-inch tortilla.  3 oz (85 g) cooked rice or pasta.  4 oz (113 g) cooked dried beans.  4 oz (113 g) starchy vegetable, such as peas, corn, or potatoes.  4 oz (113 g) hot cereal.  4 oz (113 g) mashed potatoes or  of a large baked potato.  4 oz (113 g) canned or frozen fruit.  4 oz (120 mL) fruit juice.  4-6 crackers.  6 chicken nuggets.  6 oz (170 g) unsweetened dry cereal.  6 oz (170 g) plain fat-free yogurt or yogurt sweetened with artificial sweeteners.  8 oz (240 mL) milk.  8 oz (170 g) fresh fruit or one small piece of fruit.  24 oz (680 g) popped popcorn. Example of carbohydrate counting Sample meal  3 oz (85 g) chicken breast.  6 oz (170 g)   brown rice.  4 oz (113 g) corn.  8 oz (240 mL) milk.  8 oz (170 g) strawberries with sugar-free whipped topping. Carbohydrate calculation 1. Identify the foods that contain carbohydrates: ? Rice. ? Corn. ? Milk. ? Strawberries. 2. Calculate how many servings you have of each food: ? 2 servings rice. ? 1 serving corn. ? 1 serving milk. ? 1 serving strawberries. 3. Multiply each number of servings by 15 g: ? 2 servings rice x 15 g = 30 g. ? 1 serving corn x 15 g = 15 g. ? 1 serving milk x 15 g = 15 g. ? 1  serving strawberries x 15 g = 15 g. 4. Add together all of the amounts to find the total grams of carbohydrates eaten: ? 30 g + 15 g + 15 g + 15 g = 75 g of carbohydrates total. Summary  Carbohydrate counting is a method of keeping track of how many carbohydrates you eat.  Eating carbohydrates naturally increases the amount of sugar (glucose) in the blood.  Counting how many carbohydrates you eat helps keep your blood glucose within normal limits, which helps you manage your diabetes.  A diet and nutrition specialist (registered dietitian) can help you make a meal plan and calculate how many carbohydrates you should have at each meal and snack. This information is not intended to replace advice given to you by your health care provider. Make sure you discuss any questions you have with your health care provider. Document Revised: 03/09/2017 Document Reviewed: 01/27/2016 Elsevier Patient Education  2020 [employer]  

## 2020-01-14 ENCOUNTER — Other Ambulatory Visit: Payer: Self-pay

## 2020-01-14 ENCOUNTER — Ambulatory Visit: Payer: Self-pay

## 2020-01-15 ENCOUNTER — Telehealth: Payer: Self-pay | Admitting: Pharmacist

## 2020-01-15 ENCOUNTER — Other Ambulatory Visit: Payer: Self-pay

## 2020-01-15 NOTE — Telephone Encounter (Signed)
01/15/2020 10:44:05 AM - Lantus Solostar refill to [facility]  -- [name] - Wednesday, Jan 15, 2020 10:43 AM --Arneta Cliche [employer] refill for Lantus Solostar Inject 14 units into the skin once daily #1 box.

## 2020-01-23 ENCOUNTER — Other Ambulatory Visit: Payer: Self-pay | Admitting: Gerontology

## 2020-01-23 DIAGNOSIS — I10 Essential (primary) hypertension: Secondary | ICD-10-CM

## 2020-02-12 ENCOUNTER — Other Ambulatory Visit: Payer: Self-pay | Admitting: Gerontology

## 2020-02-12 ENCOUNTER — Other Ambulatory Visit: Payer: Self-pay

## 2020-02-12 DIAGNOSIS — I10 Essential (primary) hypertension: Secondary | ICD-10-CM

## 2020-02-12 DIAGNOSIS — Z794 Long term (current) use of insulin: Secondary | ICD-10-CM

## 2020-02-12 DIAGNOSIS — E1140 Type 2 diabetes mellitus with diabetic neuropathy, unspecified: Secondary | ICD-10-CM

## 2020-02-12 DIAGNOSIS — T3695XA Adverse effect of unspecified systemic antibiotic, initial encounter: Secondary | ICD-10-CM

## 2020-02-13 ENCOUNTER — Other Ambulatory Visit: Payer: Self-pay | Admitting: Gerontology

## 2020-02-13 DIAGNOSIS — Z794 Long term (current) use of insulin: Secondary | ICD-10-CM

## 2020-02-13 MED ORDER — BASAGLAR KWIKPEN 100 UNIT/ML ~~LOC~~ SOPN: 10.0000 [IU] | PEN_INJECTOR | Freq: Every day | SUBCUTANEOUS | 2 refills | Status: DC

## 2020-02-14 ENCOUNTER — Other Ambulatory Visit: Payer: Self-pay

## 2020-02-14 LAB — BASIC METABOLIC PANEL
BUN/Creatinine Ratio: 15 (ref 9–23)
BUN: 19 mg/dL (ref 6–24)
CO2: 20 mmol/L (ref 20–29)
Calcium: 9 mg/dL (ref 8.7–10.2)
Chloride: 103 mmol/L (ref 96–106)
Creatinine, Ser: 1.23 mg/dL — ABNORMAL HIGH (ref 0.57–1.00)
GFR calc Af Amer: 55 mL/min/{1.73_m2} — ABNORMAL LOW (ref >59)
GFR calc non Af Amer: 48 mL/min/{1.73_m2} — ABNORMAL LOW (ref >59)
Glucose: 175 mg/dL — ABNORMAL HIGH (ref 65–99)
Potassium: 4.6 mmol/L (ref 3.5–5.2)
Sodium: 140 mmol/L (ref 134–144)

## 2020-02-14 LAB — UA/M W/RFLX CULTURE, ROUTINE
Bilirubin, UA: NEGATIVE
Ketones, UA: NEGATIVE
Leukocytes,UA: NEGATIVE
Nitrite, UA: NEGATIVE
Protein,UA: NEGATIVE
RBC, UA: NEGATIVE
Specific Gravity, UA: 1.029 (ref 1.005–1.030)
Urobilinogen, Ur: 0.2 mg/dL (ref 0.2–1.0)
pH, UA: 5 (ref 5.0–7.5)

## 2020-02-14 LAB — MICROSCOPIC EXAMINATION
Bacteria, UA: NONE SEEN
Casts: NONE SEEN /lpf

## 2020-02-14 LAB — URINE CULTURE, REFLEX

## 2020-02-14 LAB — HEMOGLOBIN A1C
Est. average glucose Bld gHb Est-mCnc: 223 mg/dL
Hgb A1c MFr Bld: 9.4 % — ABNORMAL HIGH (ref 4.8–5.6)

## 2020-02-19 ENCOUNTER — Ambulatory Visit: Payer: Self-pay | Admitting: Gerontology

## 2020-02-25 ENCOUNTER — Encounter: Payer: Self-pay | Admitting: Gerontology

## 2020-02-25 ENCOUNTER — Ambulatory Visit: Payer: Self-pay | Admitting: Gerontology

## 2020-02-25 ENCOUNTER — Other Ambulatory Visit: Payer: Self-pay

## 2020-02-25 VITALS — BP 131/83 | HR 82 | Ht 62.0 in | Wt 181.6 lb

## 2020-02-25 DIAGNOSIS — Z794 Long term (current) use of insulin: Secondary | ICD-10-CM

## 2020-02-25 DIAGNOSIS — I10 Essential (primary) hypertension: Secondary | ICD-10-CM

## 2020-02-25 DIAGNOSIS — K219 Gastro-esophageal reflux disease without esophagitis: Secondary | ICD-10-CM

## 2020-02-25 DIAGNOSIS — J0110 Acute frontal sinusitis, unspecified: Secondary | ICD-10-CM

## 2020-02-25 MED ORDER — BASAGLAR KWIKPEN 100 UNIT/ML ~~LOC~~ SOPN: 13.0000 [IU] | PEN_INJECTOR | Freq: Every day | SUBCUTANEOUS | 2 refills | Status: DC

## 2020-02-25 MED ORDER — FERROUS SULFATE 325 (65 FE) MG PO TABS: ORAL_TABLET | ORAL | 2 refills | Status: DC

## 2020-02-25 MED ORDER — CETIRIZINE HCL 10 MG PO TABS: 10.0000 mg | ORAL_TABLET | Freq: Every day | ORAL | 2 refills | Status: DC

## 2020-02-25 MED ORDER — METOPROLOL TARTRATE 50 MG PO TABS: ORAL_TABLET | ORAL | 0 refills | Status: DC

## 2020-02-25 MED ORDER — OMEPRAZOLE 20 MG PO CPDR: DELAYED_RELEASE_CAPSULE | ORAL | 1 refills | Status: DC

## 2020-02-25 NOTE — Patient Instructions (Addendum)
Carbohydrate Counting for Diabetes Mellitus, Adult  Carbohydrate counting is a method of keeping track of how many carbohydrates you eat. Eating carbohydrates naturally increases the amount of sugar (glucose) in the blood. Counting how many carbohydrates you eat helps keep your blood glucose within normal limits, which helps you manage your diabetes (diabetes mellitus). It is important to know how many carbohydrates you can safely have in each meal. This is different for every person. A diet and nutrition specialist (registered dietitian) can help you make a meal plan and calculate how many carbohydrates you should have at each meal and snack. Carbohydrates are found in the following foods:  Grains, such as breads and cereals.  Dried beans and soy products.  Starchy vegetables, such as potatoes, peas, and corn.  Fruit and fruit juices.  Milk and yogurt.  Sweets and snack foods, such as cake, cookies, candy, chips, and soft drinks. How do I count carbohydrates? There are two ways to count carbohydrates in food. You can use either of the methods or a combination of both. Reading "Nutrition Facts" on packaged food The "Nutrition Facts" list is included on the labels of almost all packaged foods and beverages in the [place] It includes:  The serving size.  Information about nutrients in each serving, including the grams (g) of carbohydrate per serving. To use the "Nutrition Facts":  Decide how many servings you will have.  Multiply the number of servings by the number of carbohydrates per serving.  The resulting number is the total amount of carbohydrates that you will be having. Learning standard serving sizes of other foods When you eat carbohydrate foods that are not packaged or do not include "Nutrition Facts" on the label, you need to measure the servings in order to count the amount of carbohydrates:  Measure the foods that you will eat with a food scale or measuring cup, if  needed.  Decide how many standard-size servings you will eat.  Multiply the number of servings by 15. Most carbohydrate-rich foods have about 15 g of carbohydrates per serving. ? For example, if you eat 8 oz (170 g) of strawberries, you will have eaten 2 servings and 30 g of carbohydrates (2 servings x 15 g = 30 g).  For foods that have more than one food mixed, such as soups and casseroles, you must count the carbohydrates in each food that is included. The following list contains standard serving sizes of common carbohydrate-rich foods. Each of these servings has about 15 g of carbohydrates:   hamburger bun or  English muffin.   oz (15 mL) syrup.   oz (14 g) jelly.  1 slice of bread.  1 six-inch tortilla.  3 oz (85 g) cooked rice or pasta.  4 oz (113 g) cooked dried beans.  4 oz (113 g) starchy vegetable, such as peas, corn, or potatoes.  4 oz (113 g) hot cereal.  4 oz (113 g) mashed potatoes or  of a large baked potato.  4 oz (113 g) canned or frozen fruit.  4 oz (120 mL) fruit juice.  4-6 crackers.  6 chicken nuggets.  6 oz (170 g) unsweetened dry cereal.  6 oz (170 g) plain fat-free yogurt or yogurt sweetened with artificial sweeteners.  8 oz (240 mL) milk.  8 oz (170 g) fresh fruit or one small piece of fruit.  24 oz (680 g) popped popcorn. Example of carbohydrate counting Sample meal  3 oz (85 g) chicken breast.  6 oz (170 g)   [place] rice.  4 oz (113 g) corn.  8 oz (240 mL) milk.  8 oz (170 g) strawberries with sugar-free whipped topping. Carbohydrate calculation 1. Identify the foods that contain carbohydrates: ? Rice. ? Corn. ? Milk. ? Strawberries. 2. Calculate how many servings you have of each food: ? 2 servings rice. ? 1 serving corn. ? 1 serving milk. ? 1 serving strawberries. 3. Multiply each number of servings by 15 g: ? 2 servings rice x 15 g = 30 g. ? 1 serving corn x 15 g = 15 g. ? 1 serving milk x 15 g = 15 g. ? 1  serving strawberries x 15 g = 15 g. 4. Add together all of the amounts to find the total grams of carbohydrates eaten: ? 30 g + 15 g + 15 g + 15 g = 75 g of carbohydrates total. Summary  Carbohydrate counting is a method of keeping track of how many carbohydrates you eat.  Eating carbohydrates naturally increases the amount of sugar (glucose) in the blood.  Counting how many carbohydrates you eat helps keep your blood glucose within normal limits, which helps you manage your diabetes.  A diet and nutrition specialist (registered dietitian) can help you make a meal plan and calculate how many carbohydrates you should have at each meal and snack. This information is not intended to replace advice given to you by your health care provider. Make sure you discuss any questions you have with your health care provider. Document Revised: 03/09/2017 Document Reviewed: 01/27/2016 Elsevier Patient Education  2020 [employer] Inc. Diabetes Mellitus and Exercise Exercising regularly is important for your overall health, especially when you have diabetes (diabetes mellitus). Exercising is not only about losing weight. It has many other health benefits, such as increasing muscle strength and bone density and reducing body fat and stress. This leads to improved fitness, flexibility, and endurance, all of which result in better overall health. Exercise has additional benefits for people with diabetes, including:  Reducing appetite.  Helping to lower and control blood glucose.  Lowering blood pressure.  Helping to control amounts of fatty substances (lipids) in the blood, such as cholesterol and triglycerides.  Helping the body to respond better to insulin (improving insulin sensitivity).  Reducing how much insulin the body needs.  Decreasing the risk for heart disease by: ? Lowering cholesterol and triglyceride levels. ? Increasing the levels of good cholesterol. ? Lowering blood glucose levels. What  is my activity plan? Your health care provider or certified diabetes educator can help you make a plan for the type and frequency of exercise (activity plan) that works for you. Make sure that you:  Do at least 150 minutes of moderate-intensity or vigorous-intensity exercise each week. This could be brisk walking, biking, or water aerobics. ? Do stretching and strength exercises, such as yoga or weightlifting, at least 2 times a week. ? Spread out your activity over at least 3 days of the week.  Get some form of physical activity every day. ? Do not go more than 2 days in a row without some kind of physical activity. ? Avoid being inactive for more than 30 minutes at a time. Take frequent breaks to walk or stretch.  Choose a type of exercise or activity that you enjoy, and set realistic goals.  Start slowly, and gradually increase the intensity of your exercise over time. What do I need to know about managing my diabetes?   Check your blood glucose before and after   exercising. ? If your blood glucose is 240 mg/dL (16.1 mmol/L) or higher before you exercise, check your urine for ketones. If you have ketones in your urine, do not exercise until your blood glucose returns to normal. ? If your blood glucose is 100 mg/dL (5.6 mmol/L) or lower, eat a snack containing 15-20 grams of carbohydrate. Check your blood glucose 15 minutes after the snack to make sure that your level is above 100 mg/dL (5.6 mmol/L) before you start your exercise.  Know the symptoms of low blood glucose (hypoglycemia) and how to treat it. Your risk for hypoglycemia increases during and after exercise. Common symptoms of hypoglycemia can include: ? Hunger. ? Anxiety. ? Sweating and feeling clammy. ? Confusion. ? Dizziness or feeling light-headed. ? Increased heart rate or palpitations. ? Blurry vision. ? Tingling or numbness around the mouth, lips, or tongue. ? Tremors or shakes. ? Irritability.  Keep a rapid-acting  carbohydrate snack available before, during, and after exercise to help prevent or treat hypoglycemia. Avoid injecting insulin into areas of the body that are going to be exercised. For Diabetes Mellitus and Nutrition, Adult When you have diabetes (diabetes mellitus), it is very important to have healthy eating habits because your blood sugar (glucose) levels are greatly affected by what you eat and drink. Eating healthy foods in the appropriate amounts, at about the same times every day, can help you:  Control your blood glucose.  Lower your risk of heart disease.  Improve your blood pressure.  Reach or maintain a healthy weight. Every person with diabetes is different, and each person has different needs for a meal plan. Your health care provider may recommend that you work with a diet and nutrition specialist (dietitian) to make a meal plan that is best for you. Your meal plan may vary depending on factors such as:  The calories you need.  The medicines you take.  Your weight.  Your blood glucose, blood pressure, and cholesterol levels.  Your activity level.  Other health conditions you have, such as heart or kidney disease. How do carbohydrates affect me? Carbohydrates, also called carbs, affect your blood glucose level more than any other type of food. Eating carbs naturally raises the amount of glucose in your blood. Carb counting is a method for keeping track of how many carbs you eat. Counting carbs is important to keep your blood glucose at a healthy level, especially if you use insulin or take certain oral diabetes medicines. It is important to know how many carbs you can safely have in each meal. This is different for every person. Your dietitian can help you calculate how many carbs you should have at each meal and for each snack. Foods that contain carbs include:  Bread, cereal, rice, pasta, and crackers.  Potatoes and corn.  Peas, beans, and lentils.  Milk and  yogurt.  Fruit and juice.  Desserts, such as cakes, cookies, ice cream, and candy. How does alcohol affect me? Alcohol can cause a sudden decrease in blood glucose (hypoglycemia), especially if you use insulin or take certain oral diabetes medicines. Hypoglycemia can be a life-threatening condition. Symptoms of hypoglycemia (sleepiness, dizziness, and confusion) are similar to symptoms of having too much alcohol. If your health care provider says that alcohol is safe for you, follow these guidelines:  Limit alcohol intake to no more than 1 drink per day for nonpregnant women and 2 drinks per day for men. One drink equals 12 oz of beer, 5 oz of wine,  or 1 oz of hard liquor.  Do not drink on an empty stomach.  Keep yourself hydrated with water, diet soda, or unsweetened iced tea.  Keep in mind that regular soda, juice, and other mixers may contain a lot of sugar and must be counted as carbs. What are tips for following this plan?  Reading food labels  Start by checking the serving size on the "Nutrition Facts" label of packaged foods and drinks. The amount of calories, carbs, fats, and other nutrients listed on the label is based on one serving of the item. Many items contain more than one serving per package.  Check the total grams (g) of carbs in one serving. You can calculate the number of servings of carbs in one serving by dividing the total carbs by 15. For example, if a food has 30 g of total carbs, it would be equal to 2 servings of carbs.  Check the number of grams (g) of saturated and trans fats in one serving. Choose foods that have low or no amount of these fats.  Check the number of milligrams (mg) of salt (sodium) in one serving. Most people should limit total sodium intake to less than 2,300 mg per day.  Always check the nutrition information of foods labeled as "low-fat" or "nonfat". These foods may be higher in added sugar or refined carbs and should be avoided.  Talk to  your dietitian to identify your daily goals for nutrients listed on the label. Shopping  Avoid buying canned, premade, or processed foods. These foods tend to be high in fat, sodium, and added sugar.  Shop around the outside edge of the grocery store. This includes fresh fruits and vegetables, bulk grains, fresh meats, and fresh dairy. Cooking  Use low-heat cooking methods, such as baking, instead of high-heat cooking methods like deep frying.  Cook using healthy oils, such as olive, canola, or sunflower oil.  Avoid cooking with butter, cream, or high-fat meats. Meal planning  Eat meals and snacks regularly, preferably at the same times every day. Avoid going long periods of time without eating.  Eat foods high in fiber, such as fresh fruits, vegetables, beans, and whole grains. Talk to your dietitian about how many servings of carbs you can eat at each meal.  Eat 4-6 ounces (oz) of lean protein each day, such as lean meat, chicken, fish, eggs, or tofu. One oz of lean protein is equal to: ? 1 oz of meat, chicken, or fish. ? 1 egg. ?  cup of tofu.  Eat some foods each day that contain healthy fats, such as avocado, nuts, seeds, and fish. Lifestyle  Check your blood glucose regularly.  Exercise regularly as told by your health care provider. This may include: ? 150 minutes of moderate-intensity or vigorous-intensity exercise each week. This could be brisk walking, biking, or water aerobics. ? Stretching and doing strength exercises, such as yoga or weightlifting, at least 2 times a week.  Take medicines as told by your health care provider.  Do not use any products that contain nicotine or tobacco, such as cigarettes and e-cigarettes. If you need help quitting, ask your health care provider.  Work with a Veterinary surgeon or diabetes educator to identify strategies to manage stress and any emotional and social challenges. Questions to ask a health care provider  Do I need to meet with  a diabetes educator?  Do I need to meet with a dietitian?  What number can I call if I have questions?  When are the best times to check my blood glucose? Where to find more information:  American Diabetes Association: diabetes.org  Academy of Nutrition and Dietetics: www.eatright.[employer] of Diabetes and Digestive and Kidney Diseases (NIH): CarFlippers.tn Summary  A healthy meal plan will help you control your blood glucose and maintain a healthy lifestyle.  Working with a diet and nutrition specialist (dietitian) can help you make a meal plan that is best for you.  Keep in mind that carbohydrates (carbs) and alcohol have immediate effects on your blood glucose levels. It is important to count carbs and to use alcohol carefully. This information is not intended to replace advice given to you by your health care provider. Make sure you discuss any questions you have with your health care provider. Document Revised: 07/28/2017 Document Reviewed: 09/19/2016 Elsevier Patient Education  2020 [employer].   example, avoid injecting insulin into: ? The arms, when playing tennis. ? The legs, when jogging.  Keep records of your exercise habits. Doing this can help you and your health care provider adjust your diabetes management plan as needed. Write down: ? Food that you eat before and after you exercise. ? Blood glucose levels before and after you exercise. ? The type and amount of exercise you have done. ? When your insulin is expected to peak, if you use insulin. Avoid exercising at times when your insulin is peaking.  When you start a new exercise or activity, work with your health care provider to make sure the activity is safe for you, and to adjust your insulin, medicines, or food intake as needed.  Drink plenty of water while you exercise to prevent dehydration or heat stroke. Drink enough fluid to keep your urine clear or pale yellow. Summary  Exercising  regularly is important for your overall health, especially when you have diabetes (diabetes mellitus).  Exercising has many health benefits, such as increasing muscle strength and bone density and reducing body fat and stress.  Your health care provider or certified diabetes educator can help you make a plan for the type and frequency of exercise (activity plan) that works for you.  When you start a new exercise or activity, work with your health care provider to make sure the activity is safe for you, and to adjust your insulin, medicines, or food intake as needed. This information is not intended to replace advice given to you by your health care provider. Make sure you discuss any questions you have with your health care provider. Document Revised: 03/09/2017 Document Reviewed: 01/25/2016 Elsevier Patient Education  2020 Elsevier Inc.  DASH Eating Plan DASH stands for "Dietary Approaches to Stop Hypertension." The DASH eating plan is a healthy eating plan that has been shown to reduce high blood pressure (hypertension). It may also reduce your risk for type 2 diabetes, heart disease, and stroke. The DASH eating plan may also help with weight loss. What are tips for following this plan?  General guidelines  Avoid eating more than 2,300 mg (milligrams) of salt (sodium) a day. If you have hypertension, you may need to reduce your sodium intake to 1,500 mg a day.  Limit alcohol intake to no more than 1 drink a day for nonpregnant women and 2 drinks a day for men. One drink equals 12 oz of beer, 5 oz of wine, or 1 oz of hard liquor.  Work with your health care provider to maintain a healthy body weight or to lose weight. Ask what an ideal  weight is for you.  Get at least 30 minutes of exercise that causes your heart to beat faster (aerobic exercise) most days of the week. Activities may include walking, swimming, or biking.  Work with your health care provider or diet and nutrition  specialist (dietitian) to adjust your eating plan to your individual calorie needs. Reading food labels   Check food labels for the amount of sodium per serving. Choose foods with less than 5 percent of the Daily Value of sodium. Generally, foods with less than 300 mg of sodium per serving fit into this eating plan.  To find whole grains, look for the word "whole" as the first word in the ingredient list. Shopping  Buy products labeled as "low-sodium" or "no salt added."  Buy fresh foods. Avoid canned foods and premade or frozen meals. Cooking  Avoid adding salt when cooking. Use salt-free seasonings or herbs instead of table salt or sea salt. Check with your health care provider or pharmacist before using salt substitutes.  Do not fry foods. Cook foods using healthy methods such as baking, boiling, grilling, and broiling instead.  Cook with heart-healthy oils, such as olive, canola, soybean, or sunflower oil. Meal planning  Eat a balanced diet that includes: ? 5 or more servings of fruits and vegetables each day. At each meal, try to fill half of your plate with fruits and vegetables. ? Up to 6-8 servings of whole grains each day. ? Less than 6 oz of lean meat, poultry, or fish each day. A 3-oz serving of meat is about the same size as a deck of cards. One egg equals 1 oz. ? 2 servings of low-fat dairy each day. ? A serving of nuts, seeds, or beans 5 times each week. ? Heart-healthy fats. Healthy fats called Omega-3 fatty acids are found in foods such as flaxseeds and coldwater fish, like sardines, salmon, and mackerel.  Limit how much you eat of the following: ? Canned or prepackaged foods. ? Food that is high in trans fat, such as fried foods. ? Food that is high in saturated fat, such as fatty meat. ? Sweets, desserts, sugary drinks, and other foods with added sugar. ? Full-fat dairy products.  Do not salt foods before eating.  Try to eat at least 2 vegetarian meals each  week.  Eat more home-cooked food and less restaurant, buffet, and fast food.  When eating at a restaurant, ask that your food be prepared with less salt or no salt, if possible. What foods are recommended? The items listed may not be a complete list. Talk with your dietitian about what dietary choices are best for you. Grains Whole-grain or whole-wheat bread. Whole-grain or whole-wheat pasta. Brown rice. Orpah Cobb. Bulgur. Whole-grain and low-sodium cereals. Pita bread. Low-fat, low-sodium crackers. Whole-wheat flour tortillas. Vegetables Fresh or frozen vegetables (raw, steamed, roasted, or grilled). Low-sodium or reduced-sodium tomato and vegetable juice. Low-sodium or reduced-sodium tomato sauce and tomato paste. Low-sodium or reduced-sodium canned vegetables. Fruits All fresh, dried, or frozen fruit. Canned fruit in natural juice (without added sugar). Meat and other protein foods Skinless chicken or [place]. Ground chicken or [place]. Pork with fat trimmed off. Fish and seafood. Egg whites. Dried beans, peas, or lentils. Unsalted nuts, nut butters, and seeds. Unsalted canned beans. Lean cuts of beef with fat trimmed off. Low-sodium, lean deli meat. Dairy Low-fat (1%) or fat-free (skim) milk. Fat-free, low-fat, or reduced-fat cheeses. Nonfat, low-sodium ricotta or cottage cheese. Low-fat or nonfat yogurt. Low-fat, low-sodium cheese. Fats  and oils Soft margarine without trans fats. Vegetable oil. Low-fat, reduced-fat, or light mayonnaise and salad dressings (reduced-sodium). Canola, safflower, olive, soybean, and sunflower oils. Avocado. Seasoning and other foods Herbs. Spices. Seasoning mixes without salt. Unsalted popcorn and pretzels. Fat-free sweets. What foods are not recommended? The items listed may not be a complete list. Talk with your dietitian about what dietary choices are best for you. Grains Baked goods made with fat, such as croissants, muffins, or some breads. Dry pasta  or rice meal packs. Vegetables Creamed or fried vegetables. Vegetables in a cheese sauce. Regular canned vegetables (not low-sodium or reduced-sodium). Regular canned tomato sauce and paste (not low-sodium or reduced-sodium). Regular tomato and vegetable juice (not low-sodium or reduced-sodium). Rosita FirePickles. Olives. Fruits Canned fruit in a light or heavy syrup. Fried fruit. Fruit in cream or butter sauce. Meat and other protein foods Fatty cuts of meat. Ribs. Fried meat. Tomasa BlaseBacon. Sausage. Bologna and other processed lunch meats. Salami. Fatback. Hotdogs. Bratwurst. Salted nuts and seeds. Canned beans with added salt. Canned or smoked fish. Whole eggs or egg yolks. Chicken or [place] with skin. Dairy Whole or 2% milk, cream, and half-and-half. Whole or full-fat cream cheese. Whole-fat or sweetened yogurt. Full-fat cheese. Nondairy creamers. Whipped toppings. Processed cheese and cheese spreads. Fats and oils Butter. Stick margarine. Lard. Shortening. Ghee. Bacon fat. Tropical oils, such as coconut, palm kernel, or palm oil. Seasoning and other foods Salted popcorn and pretzels. Onion salt, garlic salt, seasoned salt, table salt, and sea salt. Worcestershire sauce. Tartar sauce. Barbecue sauce. Teriyaki sauce. Soy sauce, including reduced-sodium. Steak sauce. Canned and packaged gravies. Fish sauce. Oyster sauce. Cocktail sauce. Horseradish that you find on the shelf. Ketchup. Mustard. Meat flavorings and tenderizers. Bouillon cubes. Hot sauce and Tabasco sauce. Premade or packaged marinades. Premade or packaged taco seasonings. Relishes. Regular salad dressings. Where to find more information:  National Heart, Lung, and Blood Institute: PopSteam.iswww.nhlbi.nih.gov  American Heart Association: www.heart.org Summary  The DASH eating plan is a healthy eating plan that has been shown to reduce high blood pressure (hypertension). It may also reduce your risk for type 2 diabetes, heart disease, and stroke.  With the  DASH eating plan, you should limit salt (sodium) intake to 2,300 mg a day. If you have hypertension, you may need to reduce your sodium intake to 1,500 mg a day.  When on the DASH eating plan, aim to eat more fresh fruits and vegetables, whole grains, lean proteins, low-fat dairy, and heart-healthy fats.  Work with your health care provider or diet and nutrition specialist (dietitian) to adjust your eating plan to your individual calorie needs. This information is not intended to replace advice given to you by your health care provider. Make sure you discuss any questions you have with your health care provider. Document Revised: 07/28/2017 Document Reviewed: 08/08/2016 Elsevier Patient Education  2020 [employer]

## 2020-02-25 NOTE — Progress Notes (Signed)
Established Patient Office Visit  Subjective:  Patient ID: Renee Bush, female    DOB: Aug 17, 1961  Age: 59 y.o. MRN: [id number]  CC:  Chief Complaint  Patient presents with  . Diabetes  . Lab Follow Up     HPI Renee Bush presents for follow up of type II diabetes and lab review. Her HgbA1c done on 02/12/2020 increased from 6.9% to 9.4%. She states that she's compliant with her medications, checks her blood glucose twice a day, she brought her BG log   and  fasting BG is 154 to 263 and bed time reading is 166 -265. She denies hypoglycemic symptoms but reports feeling thirsty daily, and she increased her water intake. Her BMP done on 02/12/2020, her creatinine increased from 1.18 mg/dl to 1.23 mg/dl, eGFR decreased from 51 to 48.Overall, she states that she's doing well and offers no further complaint.  Past Medical History:  Diagnosis Date  . Chronic kidney disease, stage III (moderate) 10/06/2017  . Diabetes mellitus without complication ([place])   . GERD (gastroesophageal reflux disease)   . Hypercholesteremia   . Hypertension 09/10/2015    Past Surgical History:  Procedure Laterality Date  . APPENDECTOMY    . CESAREAN SECTION  1991  . COLONOSCOPY WITH PROPOFOL N/A 06/03/2019   Procedure: COLONOSCOPY WITH PROPOFOL;  Surgeon: Jonathon Bellows, MD;  Location: [facility] ENDOSCOPY;  Service: Gastroenterology;  Laterality: N/A;    Family History  Problem Relation Age of Onset  . COPD Mother   . Alzheimer's disease Mother   . Hypertension Father   . Hyperlipidemia Father   . Heart attack Father   . Diabetes type II Father   . Diabetes type II Sister   . Diabetes type II Sister   . Gestational diabetes Daughter   . Diabetes type II Sister     Social History   Socioeconomic History  . Marital status: Married    Spouse name: Not on file  . Number of children: 4  . Years of education: Not on file  . Highest education level: 11th grade  Occupational History  .  Occupation: na  Tobacco Use  . Smoking status: Former Smoker    Types: Cigarettes    Quit date: 04/17/2005    Years since quitting: 14.8  . Smokeless tobacco: Never Used  Vaping Use  . Vaping Use: Never used  Substance and Sexual Activity  . Alcohol use: No    Alcohol/week: 14.0 standard drinks    Types: 14 Cans of beer per week    Comment: quit ~58moago  . Drug use: No  . Sexual activity: Not on file  Other Topics Concern  . Not on file  Social History Narrative   Lives in mobile home paid for   Social Determinants of Health   Financial Resource Strain:   . Difficulty of Paying Living Expenses:   Food Insecurity:   . Worried About RCharity fundraiserin the Last Year:   . RArboriculturistin the Last Year:   Transportation Needs:   . LFilm/video editor(Medical):   .Marland KitchenLack of Transportation (Non-Medical):   Physical Activity:   . Days of Exercise per Week:   . Minutes of Exercise per Session:   Stress:   . Feeling of Stress :   Social Connections:   . Frequency of Communication with Friends and Family:   . Frequency of Social Gatherings with Friends and Family:   . Attends Religious Services:   .  Active Member of Clubs or Organizations:   . Attends Archivist Meetings:   Marland Kitchen Marital Status:   Intimate Partner Violence:   . Fear of Current or Ex-Partner:   . Emotionally Abused:   Marland Kitchen Physically Abused:   . Sexually Abused:     Outpatient Medications Prior to Visit  Medication Sig Dispense Refill  . aspirin EC 81 MG tablet Take 1 tablet (81 mg total) by mouth daily.    . canagliflozin (INVOKANA) 100 MG TABS tablet Take 1 tablet (100 mg total) by mouth daily before breakfast. 30 tablet 2  . cyanocobalamin (,VITAMIN B-12,) 1000 MCG/ML injection Inject 1 mL (1,000 mcg total) into the muscle every 30 (thirty) days. 1 mL 12  . Dulaglutide (TRULICITY) 0.98 JX/9.1YN SOPN Inject 0.75 mg into the skin once a week. 4 pen 4  . DULoxetine (CYMBALTA) 60 MG capsule  Take 1 capsule (60 mg total) by mouth daily. 30 capsule 2  . fluticasone (FLONASE) 50 MCG/ACT nasal spray Place 1 spray into both nostrils daily. 1 g 1  . gabapentin (NEURONTIN) 400 MG capsule TAKE ONE CAPSULE BY MOUTH 2 TIMES A DAY AND 2 CAPSULES AT BEDTIME 120 capsule 0  . lisinopril (ZESTRIL) 10 MG tablet Take 1 tablet (10 mg total) by mouth daily. 90 tablet 0  . metFORMIN (GLUCOPHAGE) 1000 MG tablet Take 0.5 tablets (500 mg total) by mouth 2 (two) times daily with a meal. 60 tablet 2  . montelukast (SINGULAIR) 10 MG tablet Take 1 tablet (10 mg total) by mouth at bedtime. 90 tablet 0  . Omega-3 Fatty Acids (FISH OIL) 1000 MG CAPS Take 1,000 mg by mouth 2 (two) times daily.    . rosuvastatin (CRESTOR) 10 MG tablet TAKE 1/2 TABLET (5 mg) BY MOUTH EVERY DAY 30 tablet 2  . sodium chloride (OCEAN) 0.65 % SOLN nasal spray Place 1 spray into both nostrils as needed for congestion. 15 mL 0  . cetirizine (ZYRTEC) 10 MG tablet Take 1 tablet (10 mg total) by mouth daily. 30 tablet 2  . ferrous sulfate 325 (65 FE) MG tablet TAKE ONE TABLET BY MOUTH EVERY DAY WITH BREAKFAST. 30 tablet 2  . Insulin Glargine (BASAGLAR KWIKPEN) 100 UNIT/ML Inject 0.1 mLs (10 Units total) into the skin at bedtime. 1 pen 2  . metoprolol tartrate (LOPRESSOR) 50 MG tablet TAKE 1 1/2 TABLETS (25m) 2 TIMES A DAY 90 tablet 0  . omeprazole (PRILOSEC) 20 MG capsule TAKE ONE CAPSULE BY MOUTH 2 TIMES ADAY BEFORE A MEAL 120 capsule 1  . nitroGLYCERIN (NITROSTAT) 0.4 MG SL tablet 1 TABLET UNDER TONGUE AS NEEDED FOR CHEST PAIN EVERY 5 MINUTES FOR MAX OF 3 DOSES IN 15 MINUTES; IF NO RELIEF AFTER 1ST DOSE CALL 911 (Patient not taking: Reported on 02/25/2020) 25 tablet 0  . fluconazole (DIFLUCAN) 100 MG tablet Take 1 tablet (100 mg total) by mouth daily. (Patient not taking: Reported on 02/25/2020) 2 tablet 0   No facility-administered medications prior to visit.    Allergies  Allergen Reactions  . Etodolac Rash  . Penicillin G Rash  .  Penicillins Rash    Has patient had a PCN reaction causing immediate rash, facial/tongue/throat swelling, SOB or lightheadedness with hypotension: Yes Has patient had a PCN reaction causing severe rash involving mucus membranes or skin necrosis: No Has patient had a PCN reaction that required hospitalization: No Has patient had a PCN reaction occurring within the last 10 years: No If all of the above answers  are "NO", then may proceed with Cephalosporin use.    ROS Review of Systems  Constitutional: Negative.   HENT: Negative.   Eyes: Negative.   Cardiovascular: Negative.   Endocrine: Positive for polydipsia.  Genitourinary: Negative.   Skin: Negative.   Neurological: Negative.   Psychiatric/Behavioral: Negative.       Objective:    Physical Exam Vitals reviewed.  HENT:     Head: Normocephalic.  Cardiovascular:     Rate and Rhythm: Normal rate.     Heart sounds: Normal heart sounds.  Musculoskeletal:     Cervical back: Normal range of motion.  Skin:    General: Skin is warm and dry.  Neurological:     General: No focal deficit present.     Mental Status: She is alert and oriented to person, place, and time.     BP 131/83 (BP Location: Right Arm, Patient Position: Sitting)   Pulse 82   Ht 5' 2" (1.575 m)   Wt 181 lb 9.6 oz (82.4 kg)   SpO2 97%   BMI 33.22 kg/m  Wt Readings from Last 3 Encounters:  02/25/20 181 lb 9.6 oz (82.4 kg)  01/09/20 181 lb (82.1 kg)  12/18/19 181 lb 12.8 oz (82.5 kg)   She was encouraged to continue on  Her weight loss regimen.  Health Maintenance Due  Topic Date Due  . Hepatitis C Screening  Never done  . HIV Screening  Never done  . MAMMOGRAM  Never done  . OPHTHALMOLOGY EXAM  09/07/2019    There are no preventive care reminders to display for this patient.  Lab Results  Component Value Date   TSH 0.763 11/09/2017   Lab Results  Component Value Date   WBC 7.8 12/18/2019   HGB 13.8 12/18/2019   HCT 40.6 12/18/2019   MCV  92 12/18/2019   PLT 209 12/18/2019   Lab Results  Component Value Date   NA 140 02/12/2020   K 4.6 02/12/2020   CO2 20 02/12/2020   GLUCOSE 175 (H) 02/12/2020   BUN 19 02/12/2020   CREATININE 1.23 (H) 02/12/2020   BILITOT 0.3 12/18/2019   ALKPHOS 64 12/18/2019   AST 10 12/18/2019   ALT 10 12/18/2019   PROT 6.2 12/18/2019   ALBUMIN 4.2 12/18/2019   CALCIUM 9.0 02/12/2020   ANIONGAP 12 08/10/2019   Lab Results  Component Value Date   CHOL 125 08/28/2019   Lab Results  Component Value Date   HDL 42 08/28/2019   Lab Results  Component Value Date   LDLCALC 61 08/28/2019   Lab Results  Component Value Date   TRIG 125 08/28/2019   Lab Results  Component Value Date   CHOLHDL 3.0 08/28/2019   Lab Results  Component Value Date   HGBA1C 9.4 (H) 02/12/2020      Assessment & Plan:    1. Type 2 diabetes mellitus with diabetic neuropathy, with long-term current use of insulin (HCC) - Her diabetes is not controlled, HgbA1c increased from 6.9% to 9.4. Her goal HgbA1c is less than 7%. Her Lantus was increased to13 units daily, will keep metformin at 500 mg BID since her kidney function decreased, also will continue Truiicity 0.53m weekly.  -Instructed to decrease lantus to 12 units if fasting BG below 130 mg/dl.  Patient advised to continue checking BG twice a day, and keep  BG log  and meals. Instructed to do daily exercise and low carb diet.  - Insulin Glargine (BASAGLAR KWIKPEN) 100 UNIT/ML;  Inject 0.13 mLs (13 Units total) into the skin at bedtime.  Dispense: 1 pen; Refill: 2 - ferrous sulfate 325 (65 FE) MG tablet; TAKE ONE TABLET BY MOUTH EVERY DAY WITH BREAKFAST.  Dispense: 30 tablet; Refill: 2 - Basic Metabolic Panel (BMET); Future  2. Gastroesophageal reflux disease without esophagitis - Her GERD is controlled,  needs continue taking omeprazole as directed, and to continue low fat diet. - omeprazole (PRILOSEC) 20 MG capsule; TAKE ONE CAPSULE BY MOUTH 2 TIMES ADAY  BEFORE A MEAL  Dispense: 120 capsule; Refill: 1  3. Hypertension, unspecified type -Her BP is under control,  needs to continue low salt, and low cholesterol diet and exercise daily as we discussed.  Continue  metoprolol tartrate (LOPRESSOR) 50 MG tablet; TAKE 1 1/2 TABLETS (62m) 2 TIMES A DAY  Dispense: 90 tablet; Refill: 0  4. Acute frontal sinusitis, recurrence not specified -Controlled no further problems reported. Continue  cetirizine (ZYRTEC) 10 MG tablet; Take 1 tablet (10 mg total) by mouth daily.  Dispense: 30 tablet; Refill: 2            Follow-up: Return in about 4 weeks (around 03/24/2020), or if symptoms worsen or fail to improve.   DWolfgang Phoenix NP

## 2020-02-25 NOTE — Progress Notes (Deleted)
Established Patient Office Visit  Subjective:  Patient ID: Renee Bush, female    DOB: 1961-02-04  Age: 59 y.o. MRN: [id number]  CC:  Chief Complaint  Patient presents with  . Diabetes  . Lab Follow Up    HPI AMANI MARSEILLE presents for ***  Past Medical History:  Diagnosis Date  . Chronic kidney disease, stage III (moderate) 10/06/2017  . Diabetes mellitus without complication (HCC)   . GERD (gastroesophageal reflux disease)   . Hypercholesteremia   . Hypertension 09/10/2015    Past Surgical History:  Procedure Laterality Date  . APPENDECTOMY    . CESAREAN SECTION  1991  . COLONOSCOPY WITH PROPOFOL N/A 06/03/2019   Procedure: COLONOSCOPY WITH PROPOFOL;  Surgeon: Wyline Mood, MD;  Location: [facility] ENDOSCOPY;  Service: Gastroenterology;  Laterality: N/A;    Family History  Problem Relation Age of Onset  . COPD Mother   . Alzheimer's disease Mother   . Hypertension Father   . Hyperlipidemia Father   . Heart attack Father   . Diabetes type II Father   . Diabetes type II Sister   . Diabetes type II Sister   . Gestational diabetes Daughter   . Diabetes type II Sister     Social History   Socioeconomic History  . Marital status: Married    Spouse name: Not on file  . Number of children: 4  . Years of education: Not on file  . Highest education level: 11th grade  Occupational History  . Occupation: na  Tobacco Use  . Smoking status: Former Smoker    Types: Cigarettes    Quit date: 04/17/2005    Years since quitting: 14.8  . Smokeless tobacco: Never Used  Vaping Use  . Vaping Use: Never used  Substance and Sexual Activity  . Alcohol use: No    Alcohol/week: 14.0 standard drinks    Types: 14 Cans of beer per week    Comment: quit ~40mo ago  . Drug use: No  . Sexual activity: Not on file  Other Topics Concern  . Not on file  Social History Narrative   Lives in mobile home paid for   Social Determinants of Health   Financial Resource Strain:    . Difficulty of Paying Living Expenses:   Food Insecurity:   . Worried About Programme researcher, broadcasting/film/video in the Last Year:   . Barista in the Last Year:   Transportation Needs:   . Freight forwarder (Medical):   Marland Kitchen Lack of Transportation (Non-Medical):   Physical Activity:   . Days of Exercise per Week:   . Minutes of Exercise per Session:   Stress:   . Feeling of Stress :   Social Connections:   . Frequency of Communication with Friends and Family:   . Frequency of Social Gatherings with Friends and Family:   . Attends Religious Services:   . Active Member of Clubs or Organizations:   . Attends Banker Meetings:   Marland Kitchen Marital Status:   Intimate Partner Violence:   . Fear of Current or Ex-Partner:   . Emotionally Abused:   Marland Kitchen Physically Abused:   . Sexually Abused:     Outpatient Medications Prior to Visit  Medication Sig Dispense Refill  . aspirin EC 81 MG tablet Take 1 tablet (81 mg total) by mouth daily.    . canagliflozin (INVOKANA) 100 MG TABS tablet Take 1 tablet (100 mg total) by mouth daily before breakfast. 30  tablet 2  . cyanocobalamin (,VITAMIN B-12,) 1000 MCG/ML injection Inject 1 mL (1,000 mcg total) into the muscle every 30 (thirty) days. 1 mL 12  . Dulaglutide (TRULICITY) 0.75 MG/0.5ML SOPN Inject 0.75 mg into the skin once a week. 4 pen 4  . DULoxetine (CYMBALTA) 60 MG capsule Take 1 capsule (60 mg total) by mouth daily. 30 capsule 2  . fluticasone (FLONASE) 50 MCG/ACT nasal spray Place 1 spray into both nostrils daily. 1 g 1  . gabapentin (NEURONTIN) 400 MG capsule TAKE ONE CAPSULE BY MOUTH 2 TIMES A DAY AND 2 CAPSULES AT BEDTIME 120 capsule 0  . lisinopril (ZESTRIL) 10 MG tablet Take 1 tablet (10 mg total) by mouth daily. 90 tablet 0  . metFORMIN (GLUCOPHAGE) 1000 MG tablet Take 0.5 tablets (500 mg total) by mouth 2 (two) times daily with a meal. 60 tablet 2  . montelukast (SINGULAIR) 10 MG tablet Take 1 tablet (10 mg total) by mouth at bedtime.  90 tablet 0  . Omega-3 Fatty Acids (FISH OIL) 1000 MG CAPS Take 1,000 mg by mouth 2 (two) times daily.    . rosuvastatin (CRESTOR) 10 MG tablet TAKE 1/2 TABLET (5 mg) BY MOUTH EVERY DAY 30 tablet 2  . sodium chloride (OCEAN) 0.65 % SOLN nasal spray Place 1 spray into both nostrils as needed for congestion. 15 mL 0  . cetirizine (ZYRTEC) 10 MG tablet Take 1 tablet (10 mg total) by mouth daily. 30 tablet 2  . ferrous sulfate 325 (65 FE) MG tablet TAKE ONE TABLET BY MOUTH EVERY DAY WITH BREAKFAST. 30 tablet 2  . Insulin Glargine (BASAGLAR KWIKPEN) 100 UNIT/ML Inject 0.1 mLs (10 Units total) into the skin at bedtime. 1 pen 2  . metoprolol tartrate (LOPRESSOR) 50 MG tablet TAKE 1 1/2 TABLETS (75mg ) 2 TIMES A DAY 90 tablet 0  . omeprazole (PRILOSEC) 20 MG capsule TAKE ONE CAPSULE BY MOUTH 2 TIMES ADAY BEFORE A MEAL 120 capsule 1  . nitroGLYCERIN (NITROSTAT) 0.4 MG SL tablet 1 TABLET UNDER TONGUE AS NEEDED FOR CHEST PAIN EVERY 5 MINUTES FOR MAX OF 3 DOSES IN 15 MINUTES; IF NO RELIEF AFTER 1ST DOSE CALL 911 (Patient not taking: Reported on 02/25/2020) 25 tablet 0  . fluconazole (DIFLUCAN) 100 MG tablet Take 1 tablet (100 mg total) by mouth daily. (Patient not taking: Reported on 02/25/2020) 2 tablet 0   No facility-administered medications prior to visit.    Allergies  Allergen Reactions  . Etodolac Rash  . Penicillin G Rash  . Penicillins Rash    Has patient had a PCN reaction causing immediate rash, facial/tongue/throat swelling, SOB or lightheadedness with hypotension: Yes Has patient had a PCN reaction causing severe rash involving mucus membranes or skin necrosis: No Has patient had a PCN reaction that required hospitalization: No Has patient had a PCN reaction occurring within the last 10 years: No If all of the above answers are "NO", then may proceed with Cephalosporin use.    ROS Review of Systems    Objective:    Physical Exam  BP 131/83 (BP Location: Right Arm, Patient Position:  Sitting)   Pulse 82   Ht 5\' 2"  (1.575 m)   Wt 181 lb 9.6 oz (82.4 kg)   SpO2 97%   BMI 33.22 kg/m  Wt Readings from Last 3 Encounters:  02/25/20 181 lb 9.6 oz (82.4 kg)  01/09/20 181 lb (82.1 kg)  12/18/19 181 lb 12.8 oz (82.5 kg)     Health Maintenance Due  Topic Date Due  . Hepatitis C Screening  Never done  . HIV Screening  Never done  . MAMMOGRAM  Never done  . OPHTHALMOLOGY EXAM  09/07/2019    There are no preventive care reminders to display for this patient.  Lab Results  Component Value Date   TSH 0.763 11/09/2017   Lab Results  Component Value Date   WBC 7.8 12/18/2019   HGB 13.8 12/18/2019   HCT 40.6 12/18/2019   MCV 92 12/18/2019   PLT 209 12/18/2019   Lab Results  Component Value Date   NA 140 02/12/2020   K 4.6 02/12/2020   CO2 20 02/12/2020   GLUCOSE 175 (H) 02/12/2020   BUN 19 02/12/2020   CREATININE 1.23 (H) 02/12/2020   BILITOT 0.3 12/18/2019   ALKPHOS 64 12/18/2019   AST 10 12/18/2019   ALT 10 12/18/2019   PROT 6.2 12/18/2019   ALBUMIN 4.2 12/18/2019   CALCIUM 9.0 02/12/2020   ANIONGAP 12 08/10/2019   Lab Results  Component Value Date   CHOL 125 08/28/2019   Lab Results  Component Value Date   HDL 42 08/28/2019   Lab Results  Component Value Date   LDLCALC 61 08/28/2019   Lab Results  Component Value Date   TRIG 125 08/28/2019   Lab Results  Component Value Date   CHOLHDL 3.0 08/28/2019   Lab Results  Component Value Date   HGBA1C 9.4 (H) 02/12/2020      Assessment & Plan:   Problem List Items Addressed This Visit      Cardiovascular and Mediastinum   Hypertension   Relevant Medications   metoprolol tartrate (LOPRESSOR) 50 MG tablet     Respiratory   Acute sinusitis   Relevant Medications   cetirizine (ZYRTEC) 10 MG tablet     Digestive   GERD (gastroesophageal reflux disease)   Relevant Medications   omeprazole (PRILOSEC) 20 MG capsule     Endocrine   Type 2 diabetes mellitus with diabetic  neuropathy, with long-term current use of insulin (HCC) - Primary   Relevant Medications   Insulin Glargine (BASAGLAR KWIKPEN) 100 UNIT/ML   ferrous sulfate 325 (65 FE) MG tablet   Other Relevant Orders   Basic Metabolic Panel (BMET)      Meds ordered this encounter  Medications  . Insulin Glargine (BASAGLAR KWIKPEN) 100 UNIT/ML    Sig: Inject 0.13 mLs (13 Units total) into the skin at bedtime.    Dispense:  1 pen    Refill:  2  . omeprazole (PRILOSEC) 20 MG capsule    Sig: TAKE ONE CAPSULE BY MOUTH 2 TIMES ADAY BEFORE A MEAL    Dispense:  120 capsule    Refill:  1    2ND REQUEST  . DISCONTD: metoprolol tartrate (LOPRESSOR) 50 MG tablet    Sig: TAKE 1 1/2 TABLETS (75mg ) 2 TIMES A DAY    Dispense:  90 tablet    Refill:  0  . cetirizine (ZYRTEC) 10 MG tablet    Sig: Take 1 tablet (10 mg total) by mouth daily.    Dispense:  30 tablet    Refill:  2  . ferrous sulfate 325 (65 FE) MG tablet    Sig: TAKE ONE TABLET BY MOUTH EVERY DAY WITH BREAKFAST.    Dispense:  30 tablet    Refill:  2  . metoprolol tartrate (LOPRESSOR) 50 MG tablet    Sig: TAKE 1 1/2 TABLETS (75mg ) 2 TIMES A DAY    Dispense:  90 tablet    Refill:  0    Follow-up: Return in about 4 weeks (around 03/24/2020), or if symptoms worsen or fail to improve.    Chinedu P Osuorji

## 2020-03-10 ENCOUNTER — Ambulatory Visit: Payer: Self-pay | Admitting: Licensed Clinical Social Worker

## 2020-03-10 ENCOUNTER — Other Ambulatory Visit: Payer: Self-pay

## 2020-03-10 DIAGNOSIS — F411 Generalized anxiety disorder: Secondary | ICD-10-CM

## 2020-03-10 DIAGNOSIS — F3341 Major depressive disorder, recurrent, in partial remission: Secondary | ICD-10-CM

## 2020-03-10 NOTE — BH Specialist Note (Signed)
Integrated Behavioral Health Follow Up  Via Phone  MRN: [id number] Name: Renee Bush   Type of Service: Integrated Behavioral Health- Individual/Family Interpretor:No. Interpretor Name and Language: not applicable.  SUBJECTIVE: Renee Bush is a 59 y.o. female accompanied by herself. Patient was referred by self for mental health. Patient reports the following symptoms/concerns: She notes that she is fully vaccinated and feels more comfortable out in public. She notes that her nerves have not been the best and everything is getting on her nerves lately.She discussed situational stressors with her family. She notes that her legs are starting to bother her again really bad. She notes that for the past few months after the last follow up session that her anxiety has worsened. She notes that she is worrying about money, her husband's health, things needing to be fixed at the house, arguments with her husband over little things, and taking care of her mother in law. She notes that her A1C has gone up and her left eye has fluid on it due to her diabetes. She asked if the dosage of her Cymbalta could be increased for irritability and she is starting to experience pain again. She denies suicidal and homicidal thoughts.  Duration of problem:; Severity of problem: moderate  OBJECTIVE: Mood: Euthymic and Affect: Appropriate Risk of harm to self or others: No plan to harm self or others  LIFE CONTEXT: Family and Social: see above. School/Work: see above. Self-Care: see above.  Life Changes: see above.   GOALS ADDRESSED: Patient Renee: 1.  Reduce symptoms of: anxiety & frustration.  2.  Increase knowledge and/or ability of: coping skills and self-management skills  3.  Demonstrate ability to: Increase healthy adjustment to current life circumstances  INTERVENTIONS: Interventions utilized:  Supportive Counseling was utilized by the clinician during today's follow up session. Clinician  processed with the patient regarding how she has been doing since the last follow up session. Clinician measured the patient's anxiety and depression on a numerical scale. Clinician asked the patient what is causing her to feeling more anxious and agitated. Clinician explained to the patient that she Renee have a case consultation with Dr. Mare Ferrari, on Tuesday July 20th @ 9am to discuss either increasing the dosage of her Cymbalta or adding an additional medication for her nerves.   Standardized Assessments completed: GAD-7 and PHQ 9 03/10/2020 GAD 7 14. Last GAD 7 on 12/05/2019 was a 5.  PHQ 9. 9 Last PHQ 9 on 12/05/2019 was a 4.  ASSESSMENT: Patient currently experiencing see above.  Patient may benefit from see above.   PLAN: 1. Follow up with behavioral health clinician on :  2. Behavioral recommendations:  3. Referral(s): Integrated [facility] (In Clinic) 4. "From scale of 1-10, how likely are you to follow plan?":   Althia Forts, LCSW

## 2020-03-17 ENCOUNTER — Other Ambulatory Visit: Payer: Self-pay

## 2020-03-17 ENCOUNTER — Ambulatory Visit: Payer: Self-pay | Admitting: Licensed Clinical Social Worker

## 2020-03-17 DIAGNOSIS — F3341 Major depressive disorder, recurrent, in partial remission: Secondary | ICD-10-CM

## 2020-03-17 DIAGNOSIS — F411 Generalized anxiety disorder: Secondary | ICD-10-CM

## 2020-03-17 MED ORDER — DULOXETINE HCL 30 MG PO CPEP: 90.0000 mg | ORAL_CAPSULE | Freq: Every day | ORAL | 1 refills | Status: DC

## 2020-03-17 NOTE — BH Specialist Note (Signed)
Integrated Behavioral Health Follow Up Visit Via Phone  MRN: [id number] Name: Renee Bush  Type of Service: Integrated Behavioral Health- Individual/Family Interpretor:No. Interpretor Name and Language: not applicable.   SUBJECTIVE: Renee Bush is a 59 y.o. female accompanied by herself. Patient was referred by self for mental health. Patient reports the following symptoms/concerns: She notes that she is not as bad this week as she was last week. She discussed situational stressors with her husband and feels like she cannot say anything to him. She explained that she has tried having conversations with her husband about her feelings but it makes things worse. She notes that some days she has all four of her daughter's kids to watch. She explains that lately that she is so tired and sleepy in the afternoons all the time. She explains that this occurs regardless of when she is watching the kids. She denies suicidal and homicidal thoughts.  Duration of problem: ; Severity of problem: moderate  OBJECTIVE: Mood: Euthymic and Affect: Appropriate Risk of harm to self or others: No plan to harm self or others  LIFE CONTEXT: Family and Social: see above. School/Work: see above. Self-Care: see above.  Life Changes: see above.   GOALS ADDRESSED: Patient will: 1.  Reduce symptoms of: anxiety  2.  Increase knowledge and/or ability of: self-management skills and stress reduction  3.  Demonstrate ability to: Increase healthy adjustment to current life circumstances  INTERVENTIONS: Interventions utilized:  Supportive Counseling was utilized by the clinician during today's follow up session. Clinician processed with the patient regarding how she has been doing since the last follow up session. Clinician informed the patient that she had a case consultation with Dr. Mare Ferrari, MD psychiatric consultant who recommended increasing Cymbalta from 60 mg to 90 mg a day and that she follow up more  often for therapy. Clinician utilized reflective listening encouraging the patient to ventilate her feelings towards her current situation.  Standardized Assessments completed: next session.  ASSESSMENT: Patient currently experiencing see above.  Patient may benefit from see above.  PLAN: 1. Follow up with behavioral health clinician on :  2. Behavioral recommendations: see above.  3. Referral(s): Integrated [facility] (In Clinic) 4. "From scale of 1-10, how likely are you to follow plan?":   Althia Forts, LCSW

## 2020-03-18 ENCOUNTER — Other Ambulatory Visit: Payer: Self-pay | Admitting: Gerontology

## 2020-03-18 DIAGNOSIS — Z794 Long term (current) use of insulin: Secondary | ICD-10-CM

## 2020-03-31 ENCOUNTER — Telehealth: Payer: Self-pay | Admitting: Pharmacist

## 2020-03-31 ENCOUNTER — Other Ambulatory Visit: Payer: Self-pay

## 2020-03-31 ENCOUNTER — Ambulatory Visit: Payer: Self-pay | Admitting: Licensed Clinical Social Worker

## 2020-03-31 DIAGNOSIS — F3341 Major depressive disorder, recurrent, in partial remission: Secondary | ICD-10-CM

## 2020-03-31 DIAGNOSIS — F411 Generalized anxiety disorder: Secondary | ICD-10-CM

## 2020-03-31 NOTE — BH Specialist Note (Signed)
Integrated Behavioral Health Follow Up Visit Via Phone  MRN: [id number] Name: Renee Bush  Type of Service: Integrated Behavioral Health- Individual/Family Interpretor:No. Interpretor Name and Language: not applicable.   SUBJECTIVE: Renee Bush is a 59 y.o. female accompanied by herself. Patient was referred by self for mental health. Patient reports the following symptoms/concerns: She notes that she has been learning to make cakes and just got through making a cake for her daughter's birthday this week. She notes that she was under the impression that the Cymbalta increased dosage was not sent into the pharmacy and has been there a couple of times since then. She notes that she will call in the morning. She notes that her anxiety and depression is the same. She notes that she asked her husband to tell his doctor to increase his dosage of his psychotropic medication. She notes that there are four different birthdays in her family this month. She denies suicidal and homicidal thoughts.  Duration of problem:  Severity of problem: moderate  OBJECTIVE: Mood: Euthymic and Affect: Appropriate Risk of harm to self or others: No plan to harm self or others  LIFE CONTEXT: Family and Social: see above. School/Work: see above. Self-Care: see above. Life Changes: see above.   GOALS ADDRESSED: Patient will: 1.  Reduce symptoms of: anxiety  2.  Increase knowledge and/or ability of: self-management skills and stress reduction  3.  Demonstrate ability to: Increase healthy adjustment to current life circumstances  INTERVENTIONS: Interventions utilized:  Supportive Counseling was utilized by the clinician during today's follow up session. Clinician processed with the patient regarding how she has been doing since the last follow up session. Clinician measured the patient's anxiety and depression on a numerical scale. Clinician asked the patient how she has been doing on the increased  dosage of Cymbalta from 60 mg to 90 mg. Clinician informed the patient that the increased dosage of her prescription was sent on July 20th @ 12:14 pm and there is a receipt in her chart from the pharmacy that they received it. Clinician explained to the patient that it sounds like baking cakes for various members of the family for birthdays is helping to put on her mind on something else.  Standardized Assessments completed: GAD-7 and PHQ 9 GAD 7 14 PHQ 9 9 ASSESSMENT: Patient currently experiencing see above.   Patient may benefit from see above.   PLAN: 1. Follow up with behavioral health clinician on : see above.  2. Behavioral recommendations: see above. 3. Referral(s): Integrated [facility] (In Clinic) 4. "From scale of 1-10, how likely are you to follow plan?":   Althia Forts, LCSW

## 2020-03-31 NOTE — Telephone Encounter (Signed)
03/31/2020 2:46:53 PM - Cymbalta dose increase to [facility]  -- [name] - Tuesday, March 31, 2020 2:45 PM --Daylene Posey form for Cymbalta Dose increase to 30mg  Take 3 capsules by mouth every day, (previously 60mg ).

## 2020-04-01 ENCOUNTER — Other Ambulatory Visit: Payer: Self-pay

## 2020-04-01 DIAGNOSIS — E1140 Type 2 diabetes mellitus with diabetic neuropathy, unspecified: Secondary | ICD-10-CM

## 2020-04-02 LAB — BASIC METABOLIC PANEL
BUN/Creatinine Ratio: 18 (ref 9–23)
BUN: 21 mg/dL (ref 6–24)
CO2: 24 mmol/L (ref 20–29)
Calcium: 8.7 mg/dL (ref 8.7–10.2)
Chloride: 104 mmol/L (ref 96–106)
Creatinine, Ser: 1.16 mg/dL — ABNORMAL HIGH (ref 0.57–1.00)
GFR calc Af Amer: 60 mL/min/{1.73_m2} (ref >59)
GFR calc non Af Amer: 52 mL/min/{1.73_m2} — ABNORMAL LOW (ref >59)
Glucose: 203 mg/dL — ABNORMAL HIGH (ref 65–99)
Potassium: 4.5 mmol/L (ref 3.5–5.2)
Sodium: 140 mmol/L (ref 134–144)

## 2020-04-07 ENCOUNTER — Other Ambulatory Visit: Payer: Self-pay

## 2020-04-07 ENCOUNTER — Encounter: Payer: Self-pay | Admitting: Gerontology

## 2020-04-07 ENCOUNTER — Ambulatory Visit: Payer: Self-pay | Admitting: Gerontology

## 2020-04-07 VITALS — BP 114/79 | HR 83 | Ht 62.0 in | Wt 180.0 lb

## 2020-04-07 DIAGNOSIS — Z794 Long term (current) use of insulin: Secondary | ICD-10-CM

## 2020-04-07 DIAGNOSIS — E7800 Pure hypercholesterolemia, unspecified: Secondary | ICD-10-CM

## 2020-04-07 DIAGNOSIS — J0110 Acute frontal sinusitis, unspecified: Secondary | ICD-10-CM

## 2020-04-07 DIAGNOSIS — I10 Essential (primary) hypertension: Secondary | ICD-10-CM

## 2020-04-07 DIAGNOSIS — F3341 Major depressive disorder, recurrent, in partial remission: Secondary | ICD-10-CM

## 2020-04-07 MED ORDER — BASAGLAR KWIKPEN 100 UNIT/ML ~~LOC~~ SOPN: 17.0000 [IU] | PEN_INJECTOR | Freq: Every day | SUBCUTANEOUS | 4 refills | Status: DC

## 2020-04-07 MED ORDER — GABAPENTIN 400 MG PO CAPS: ORAL_CAPSULE | ORAL | 0 refills | Status: DC

## 2020-04-07 MED ORDER — ROSUVASTATIN CALCIUM 10 MG PO TABS: ORAL_TABLET | ORAL | 2 refills | Status: DC

## 2020-04-07 MED ORDER — DULOXETINE HCL 30 MG PO CPEP: 90.0000 mg | ORAL_CAPSULE | Freq: Every day | ORAL | 1 refills | Status: DC

## 2020-04-07 MED ORDER — LISINOPRIL 10 MG PO TABS: 10.0000 mg | ORAL_TABLET | Freq: Every day | ORAL | 0 refills | Status: DC

## 2020-04-07 MED ORDER — CANAGLIFLOZIN 100 MG PO TABS: ORAL_TABLET | ORAL | 0 refills | Status: DC

## 2020-04-07 MED ORDER — CETIRIZINE HCL 10 MG PO TABS: 10.0000 mg | ORAL_TABLET | Freq: Every day | ORAL | 2 refills | Status: DC

## 2020-04-07 MED ORDER — METOPROLOL TARTRATE 50 MG PO TABS: ORAL_TABLET | ORAL | 0 refills | Status: DC

## 2020-04-07 MED ORDER — METFORMIN HCL 1000 MG PO TABS: 500.0000 mg | ORAL_TABLET | Freq: Two times a day (BID) | ORAL | 2 refills | Status: DC

## 2020-04-07 NOTE — Progress Notes (Signed)
Established Patient Office Visit  Subjective:  Patient ID: Renee Bush, female    DOB: [birth year]  Age: 59 y.o. MRN: [id number]  CC:  Chief Complaint  Patient presents with  . Diabetes    HPI Renee Bush presents for follow up of Type 2 Diabetes Mellitus, Lab review and medication refill. Her HgbA1c done on 02/12/2020 was 9.4%, she states that she's compliant with her medication regimen and continues to adhere to ADA diet. She states that she checks her blood glucose bid and her fasting reading today was 194 mg/dl. She denies hypoglycemic symptoms, peripheral neuropathy and endorses polyuria, polyphagia and polydipsia. She had Diabetic eye exam at [facility] on 03/07/2020. Her Creatinine Serum done on 04/01/2020 decreased from 1.23 mg/dl to 1.16 mg/dl, and eGFR increased from 48 to 52. Overall, she states that she's doing well and offers no further complaint.  Past Medical History:  Diagnosis Date  . Chronic kidney disease, stage III (moderate) 10/06/2017  . Diabetes mellitus without complication ([place])   . GERD (gastroesophageal reflux disease)   . Hypercholesteremia   . Hypertension 09/10/2015    Past Surgical History:  Procedure Laterality Date  . APPENDECTOMY    . CESAREAN SECTION  1991  . COLONOSCOPY WITH PROPOFOL N/A 06/03/2019   Procedure: COLONOSCOPY WITH PROPOFOL;  Surgeon: Jonathon Bellows, MD;  Location: [facility] ENDOSCOPY;  Service: Gastroenterology;  Laterality: N/A;    Family History  Problem Relation Age of Onset  . COPD Mother   . Alzheimer's disease Mother   . Hypertension Father   . Hyperlipidemia Father   . Heart attack Father   . Diabetes type II Father   . Diabetes type II Sister   . Diabetes type II Sister   . Gestational diabetes Daughter   . Diabetes type II Sister     Social History   Socioeconomic History  . Marital status: Married    Spouse name: Not on file  . Number of children: 4  . Years of education: Not on file   . Highest education level: 11th grade  Occupational History  . Occupation: na  Tobacco Use  . Smoking status: Former Smoker    Types: Cigarettes    Quit date: 04/17/2005    Years since quitting: 14.9  . Smokeless tobacco: Never Used  Vaping Use  . Vaping Use: Never used  Substance and Sexual Activity  . Alcohol use: No    Alcohol/week: 14.0 standard drinks    Types: 14 Cans of beer per week    Comment: quit ~52moago  . Drug use: No  . Sexual activity: Not on file  Other Topics Concern  . Not on file  Social History Narrative   Lives in mobile home paid for   Social Determinants of Health   Financial Resource Strain:   . Difficulty of Paying Living Expenses:   Food Insecurity:   . Worried About RCharity fundraiserin the Last Year:   . RArboriculturistin the Last Year:   Transportation Needs:   . LFilm/video editor(Medical):   .Marland KitchenLack of Transportation (Non-Medical):   Physical Activity:   . Days of Exercise per Week:   . Minutes of Exercise per Session:   Stress:   . Feeling of Stress :   Social Connections:   . Frequency of Communication with Friends and Family:   . Frequency of Social Gatherings with Friends and Family:   . Attends Religious Services:   .  Active Member of Clubs or Organizations:   . Attends Archivist Meetings:   Marland Kitchen Marital Status:   Intimate Partner Violence:   . Fear of Current or Ex-Partner:   . Emotionally Abused:   Marland Kitchen Physically Abused:   . Sexually Abused:     Outpatient Medications Prior to Visit  Medication Sig Dispense Refill  . aspirin EC 81 MG tablet Take 1 tablet (81 mg total) by mouth daily.    . Dulaglutide (TRULICITY) 4.96 PR/9.1MB SOPN Inject 0.75 mg into the skin once a week. 4 pen 4  . ferrous sulfate 325 (65 FE) MG tablet TAKE ONE TABLET BY MOUTH EVERY DAY WITH BREAKFAST. 30 tablet 2  . montelukast (SINGULAIR) 10 MG tablet Take 1 tablet (10 mg total) by mouth at bedtime. 90 tablet 0  . omeprazole (PRILOSEC)  20 MG capsule TAKE ONE CAPSULE BY MOUTH 2 TIMES ADAY BEFORE A MEAL 120 capsule 1  . cetirizine (ZYRTEC) 10 MG tablet Take 1 tablet (10 mg total) by mouth daily. 30 tablet 2  . DULoxetine (CYMBALTA) 30 MG capsule Take 3 capsules (90 mg total) by mouth daily. 90 capsule 1  . gabapentin (NEURONTIN) 400 MG capsule TAKE ONE CAPSULE BY MOUTH 2 TIMES A DAY AND 2 CAPSULES AT BEDTIME 120 capsule 0  . Insulin Glargine (BASAGLAR KWIKPEN) 100 UNIT/ML Inject 0.13 mLs (13 Units total) into the skin at bedtime. 1 pen 2  . INVOKANA 100 MG TABS tablet Take 1 tablet (100 mg total) by mouth daily before breakfast. 90 tablet 0  . lisinopril (ZESTRIL) 10 MG tablet Take 1 tablet (10 mg total) by mouth daily. 90 tablet 0  . metFORMIN (GLUCOPHAGE) 1000 MG tablet Take 0.5 tablets (500 mg total) by mouth 2 (two) times daily with a meal. 60 tablet 2  . metoprolol tartrate (LOPRESSOR) 50 MG tablet TAKE 1 1/2 TABLETS (60m) 2 TIMES A DAY 90 tablet 0  . rosuvastatin (CRESTOR) 10 MG tablet TAKE 1/2 TABLET (5 mg) BY MOUTH EVERY DAY 30 tablet 2  . cyanocobalamin (,VITAMIN B-12,) 1000 MCG/ML injection Inject 1 mL (1,000 mcg total) into the muscle every 30 (thirty) days. 1 mL 12  . fluticasone (FLONASE) 50 MCG/ACT nasal spray Place 1 spray into both nostrils daily. 1 g 1  . nitroGLYCERIN (NITROSTAT) 0.4 MG SL tablet 1 TABLET UNDER TONGUE AS NEEDED FOR CHEST PAIN EVERY 5 MINUTES FOR MAX OF 3 DOSES IN 15 MINUTES; IF NO RELIEF AFTER 1ST DOSE CALL 911 (Patient not taking: Reported on 02/25/2020) 25 tablet 0  . Omega-3 Fatty Acids (FISH OIL) 1000 MG CAPS Take 1,000 mg by mouth 2 (two) times daily.    . sodium chloride (OCEAN) 0.65 % SOLN nasal spray Place 1 spray into both nostrils as needed for congestion. 15 mL 0   No facility-administered medications prior to visit.    Allergies  Allergen Reactions  . Etodolac Rash  . Penicillin G Rash  . Penicillins Rash    Has patient had a PCN reaction causing immediate rash,  facial/tongue/throat swelling, SOB or lightheadedness with hypotension: Yes Has patient had a PCN reaction causing severe rash involving mucus membranes or skin necrosis: No Has patient had a PCN reaction that required hospitalization: No Has patient had a PCN reaction occurring within the last 10 years: No If all of the above answers are "NO", then may proceed with Cephalosporin use.    ROS Review of Systems  Constitutional: Negative.   Eyes: Negative.   Respiratory: Negative.  Cardiovascular: Negative.   Endocrine: Positive for polydipsia, polyphagia and polyuria.  Skin: Negative.   Neurological: Negative.   Psychiatric/Behavioral: Negative.       Objective:    Physical Exam HENT:     Head: Normocephalic and atraumatic.  Eyes:     Extraocular Movements: Extraocular movements intact.     Pupils: Pupils are equal, round, and reactive to light.  Cardiovascular:     Rate and Rhythm: Normal rate and regular rhythm.     Pulses: Normal pulses.     Heart sounds: Normal heart sounds.  Pulmonary:     Effort: Pulmonary effort is normal.     Breath sounds: Normal breath sounds.  Skin:    General: Skin is warm.  Neurological:     General: No focal deficit present.     Mental Status: She is alert and oriented to person, place, and time. Mental status is at baseline.  Psychiatric:        Mood and Affect: Mood normal.        Behavior: Behavior normal.        Thought Content: Thought content normal.        Judgment: Judgment normal.     BP 114/79 (BP Location: Left Arm, Patient Position: Sitting)   Pulse 83   Ht 5' 2" (1.575 m)   Wt 180 lb (81.6 kg)   SpO2 98%   BMI 32.92 kg/m  Wt Readings from Last 3 Encounters:  04/07/20 180 lb (81.6 kg)  02/25/20 181 lb 9.6 oz (82.4 kg)  01/09/20 181 lb (82.1 kg)   She was encouraged to continue on her weight loss regimen.  Health Maintenance Due  Topic Date Due  . Hepatitis C Screening  Never done  . HIV Screening  Never done   . INFLUENZA VACCINE  03/29/2020    There are no preventive care reminders to display for this patient.  Lab Results  Component Value Date   TSH 0.763 11/09/2017   Lab Results  Component Value Date   WBC 7.8 12/18/2019   HGB 13.8 12/18/2019   HCT 40.6 12/18/2019   MCV 92 12/18/2019   PLT 209 12/18/2019   Lab Results  Component Value Date   NA 140 04/01/2020   K 4.5 04/01/2020   CO2 24 04/01/2020   GLUCOSE 203 (H) 04/01/2020   BUN 21 04/01/2020   CREATININE 1.16 (H) 04/01/2020   BILITOT 0.3 12/18/2019   ALKPHOS 64 12/18/2019   AST 10 12/18/2019   ALT 10 12/18/2019   PROT 6.2 12/18/2019   ALBUMIN 4.2 12/18/2019   CALCIUM 8.7 04/01/2020   ANIONGAP 12 08/10/2019   Lab Results  Component Value Date   CHOL 125 08/28/2019   Lab Results  Component Value Date   HDL 42 08/28/2019   Lab Results  Component Value Date   LDLCALC 61 08/28/2019   Lab Results  Component Value Date   TRIG 125 08/28/2019   Lab Results  Component Value Date   CHOLHDL 3.0 08/28/2019   Lab Results  Component Value Date   HGBA1C 9.4 (H) 02/12/2020      Assessment & Plan:    1. Acute frontal sinusitis, recurrence not specified - She states that her seasonal allergy is under control and she will continue on current treatment regimen. - cetirizine (ZYRTEC) 10 MG tablet; Take 1 tablet (10 mg total) by mouth daily.  Dispense: 30 tablet; Refill: 2  2. Hypercholesteremia - She will continue on current treatment regimen and  was advised to continue on Low fat/cholesterol diet and exercise as tolerated. - rosuvastatin (CRESTOR) 10 MG tablet; TAKE 1/2 TABLET (5 mg) BY MOUTH EVERY DAY  Dispense: 30 tablet; Refill: 2  3. Type 2 diabetes mellitus with diabetic neuropathy, with long-term current use of insulin (HCC) - Her HgbA1c was 9.4%, and her goal should be less than 7%. Her Insulin Glargine was increased to 17 units at bedtime. She will continue on current treatment regimen, check blood  glucose bid, record and bring log to follow up appointment. She was advised that her fasting readings should be between 80-130 mg/cl. She will continue on low carb/non concentrated sweet diet and exercise as tolerated. - gabapentin (NEURONTIN) 400 MG capsule; Take 1 capsule twice a day and 2 capsules at bedtime  Dispense: 120 capsule; Refill: 0 - Insulin Glargine (BASAGLAR KWIKPEN) 100 UNIT/ML; Inject 0.17 mLs (17 Units total) into the skin at bedtime.  Dispense: 3 mL; Refill: 4 - canagliflozin (INVOKANA) 100 MG TABS tablet; Take 1 tablet (100 mg total) by mouth daily before breakfast.  Dispense: 90 tablet; Refill: 0 - metFORMIN (GLUCOPHAGE) 1000 MG tablet; Take 0.5 tablets (500 mg total) by mouth 2 (two) times daily with a meal.  Dispense: 60 tablet; Refill: 2 - HgB A1c; Future - Basic Metabolic Panel (BMET); Future  4. Hypertension, unspecified type - Her Kidney function is improving, she was advised to increase water intake and BMP will be rechecked. - Her blood pressure is under control and she was advised to continue on current medication, DASH diet and exercise as tolerated. - metoprolol tartrate (LOPRESSOR) 50 MG tablet; TAKE 1 1/2 TABLETS (52m) 2 TIMES A DAY  Dispense: 90 tablet; Refill: 0 - lisinopril (ZESTRIL) 10 MG tablet; Take 1 tablet (10 mg total) by mouth daily.  Dispense: 90 tablet; Refill: 0 - Basic Metabolic Panel (BMET); Future  5. Recurrent major depressive disorder, in partial [place] - Her mood is good, she will continue on current medication and was advised to call Crisis Help line with worsening symptoms. - DULoxetine (CYMBALTA) 30 MG capsule; Take 3 capsules (90 mg total) by mouth daily.  Dispense: 90 capsule; Refill: 1    Follow-up: Return in about 6 weeks (around 05/20/2020), or if symptoms worsen or fail to improve.     EJerold Coombe NP

## 2020-04-07 NOTE — Patient Instructions (Signed)
Carbohydrate Counting for Diabetes Mellitus, Adult  Carbohydrate counting is a method of keeping track of how many carbohydrates you eat. Eating carbohydrates naturally increases the amount of sugar (glucose) in the blood. Counting how many carbohydrates you eat helps keep your blood glucose within normal limits, which helps you manage your diabetes (diabetes mellitus). It is important to know how many carbohydrates you can safely have in each meal. This is different for every person. A diet and nutrition specialist (registered dietitian) can help you make a meal plan and calculate how many carbohydrates you should have at each meal and snack. Carbohydrates are found in the following foods:  Grains, such as breads and cereals.  Dried beans and soy products.  Starchy vegetables, such as potatoes, peas, and corn.  Fruit and fruit juices.  Milk and yogurt.  Sweets and snack foods, such as cake, cookies, candy, chips, and soft drinks. How do I count carbohydrates? There are two ways to count carbohydrates in food. You can use either of the methods or a combination of both. Reading "Nutrition Facts" on packaged food The "Nutrition Facts" list is included on the labels of almost all packaged foods and beverages in the [place] It includes:  The serving size.  Information about nutrients in each serving, including the grams (g) of carbohydrate per serving. To use the "Nutrition Facts":  Decide how many servings you will have.  Multiply the number of servings by the number of carbohydrates per serving.  The resulting number is the total amount of carbohydrates that you will be having. Learning standard serving sizes of other foods When you eat carbohydrate foods that are not packaged or do not include "Nutrition Facts" on the label, you need to measure the servings in order to count the amount of carbohydrates:  Measure the foods that you will eat with a food scale or measuring cup, if needed.   Decide how many standard-size servings you will eat.  Multiply the number of servings by 15. Most carbohydrate-rich foods have about 15 g of carbohydrates per serving. ? For example, if you eat 8 oz (170 g) of strawberries, you will have eaten 2 servings and 30 g of carbohydrates (2 servings x 15 g = 30 g).  For foods that have more than one food mixed, such as soups and casseroles, you must count the carbohydrates in each food that is included. The following list contains standard serving sizes of common carbohydrate-rich foods. Each of these servings has about 15 g of carbohydrates:   hamburger bun or  English muffin.   oz (15 mL) syrup.   oz (14 g) jelly.  1 slice of bread.  1 six-inch tortilla.  3 oz (85 g) cooked rice or pasta.  4 oz (113 g) cooked dried beans.  4 oz (113 g) starchy vegetable, such as peas, corn, or potatoes.  4 oz (113 g) hot cereal.  4 oz (113 g) mashed potatoes or  of a large baked potato.  4 oz (113 g) canned or frozen fruit.  4 oz (120 mL) fruit juice.  4-6 crackers.  6 chicken nuggets.  6 oz (170 g) unsweetened dry cereal.  6 oz (170 g) plain fat-free yogurt or yogurt sweetened with artificial sweeteners.  8 oz (240 mL) milk.  8 oz (170 g) fresh fruit or one small piece of fruit.  24 oz (680 g) popped popcorn. Example of carbohydrate counting Sample meal  3 oz (85 g) chicken breast.  6 oz (170 g)   brown rice.  4 oz (113 g) corn.  8 oz (240 mL) milk.  8 oz (170 g) strawberries with sugar-free whipped topping. Carbohydrate calculation 1. Identify the foods that contain carbohydrates: ? Rice. ? Corn. ? Milk. ? Strawberries. 2. Calculate how many servings you have of each food: ? 2 servings rice. ? 1 serving corn. ? 1 serving milk. ? 1 serving strawberries. 3. Multiply each number of servings by 15 g: ? 2 servings rice x 15 g = 30 g. ? 1 serving corn x 15 g = 15 g. ? 1 serving milk x 15 g = 15 g. ? 1 serving  strawberries x 15 g = 15 g. 4. Add together all of the amounts to find the total grams of carbohydrates eaten: ? 30 g + 15 g + 15 g + 15 g = 75 g of carbohydrates total. Summary  Carbohydrate counting is a method of keeping track of how many carbohydrates you eat.  Eating carbohydrates naturally increases the amount of sugar (glucose) in the blood.  Counting how many carbohydrates you eat helps keep your blood glucose within normal limits, which helps you manage your diabetes.  A diet and nutrition specialist (registered dietitian) can help you make a meal plan and calculate how many carbohydrates you should have at each meal and snack. This information is not intended to replace advice given to you by your health care provider. Make sure you discuss any questions you have with your health care provider. Document Revised: 03/09/2017 Document Reviewed: 01/27/2016 Elsevier Patient Education  2020 [employer] DASH Eating Plan DASH stands for "Dietary Approaches to Stop Hypertension." The DASH eating plan is a healthy eating plan that has been shown to reduce high blood pressure (hypertension). It may also reduce your risk for type 2 diabetes, heart disease, and stroke. The DASH eating plan may also help with weight loss. What are tips for following this plan?  General guidelines  Avoid eating more than 2,300 mg (milligrams) of salt (sodium) a day. If you have hypertension, you may need to reduce your sodium intake to 1,500 mg a day.  Limit alcohol intake to no more than 1 drink a day for nonpregnant women and 2 drinks a day for men. One drink equals 12 oz of beer, 5 oz of wine, or 1 oz of hard liquor.  Work with your health care provider to maintain a healthy body weight or to lose weight. Ask what an ideal weight is for you.  Get at least 30 minutes of exercise that causes your heart to beat faster (aerobic exercise) most days of the week. Activities may include walking, swimming, or  biking.  Work with your health care provider or diet and nutrition specialist (dietitian) to adjust your eating plan to your individual calorie needs. Reading food labels   Check food labels for the amount of sodium per serving. Choose foods with less than 5 percent of the Daily Value of sodium. Generally, foods with less than 300 mg of sodium per serving fit into this eating plan.  To find whole grains, look for the word "whole" as the first word in the ingredient list. Shopping  Buy products labeled as "low-sodium" or "no salt added."  Buy fresh foods. Avoid canned foods and premade or frozen meals. Cooking  Avoid adding salt when cooking. Use salt-free seasonings or herbs instead of table salt or sea salt. Check with your health care provider or pharmacist before using salt substitutes.  Do not   fry foods. Cook foods using healthy methods such as baking, boiling, grilling, and broiling instead.  Cook with heart-healthy oils, such as olive, canola, soybean, or sunflower oil. Meal planning  Eat a balanced diet that includes: ? 5 or more servings of fruits and vegetables each day. At each meal, try to fill half of your plate with fruits and vegetables. ? Up to 6-8 servings of whole grains each day. ? Less than 6 oz of lean meat, poultry, or fish each day. A 3-oz serving of meat is about the same size as a deck of cards. One egg equals 1 oz. ? 2 servings of low-fat dairy each day. ? A serving of nuts, seeds, or beans 5 times each week. ? Heart-healthy fats. Healthy fats called Omega-3 fatty acids are found in foods such as flaxseeds and coldwater fish, like sardines, salmon, and mackerel.  Limit how much you eat of the following: ? Canned or prepackaged foods. ? Food that is high in trans fat, such as fried foods. ? Food that is high in saturated fat, such as fatty meat. ? Sweets, desserts, sugary drinks, and other foods with added sugar. ? Full-fat dairy products.  Do not salt  foods before eating.  Try to eat at least 2 vegetarian meals each week.  Eat more home-cooked food and less restaurant, buffet, and fast food.  When eating at a restaurant, ask that your food be prepared with less salt or no salt, if possible. What foods are recommended? The items listed may not be a complete list. Talk with your dietitian about what dietary choices are best for you. Grains Whole-grain or whole-wheat bread. Whole-grain or whole-wheat pasta. Brown rice. Oatmeal. Quinoa. Bulgur. Whole-grain and low-sodium cereals. Pita bread. Low-fat, low-sodium crackers. Whole-wheat flour tortillas. Vegetables Fresh or frozen vegetables (raw, steamed, roasted, or grilled). Low-sodium or reduced-sodium tomato and vegetable juice. Low-sodium or reduced-sodium tomato sauce and tomato paste. Low-sodium or reduced-sodium canned vegetables. Fruits All fresh, dried, or frozen fruit. Canned fruit in natural juice (without added sugar). Meat and other protein foods Skinless chicken or turkey. Ground chicken or turkey. Pork with fat trimmed off. Fish and seafood. Egg whites. Dried beans, peas, or lentils. Unsalted nuts, nut butters, and seeds. Unsalted canned beans. Lean cuts of beef with fat trimmed off. Low-sodium, lean deli meat. Dairy Low-fat (1%) or fat-free (skim) milk. Fat-free, low-fat, or reduced-fat cheeses. Nonfat, low-sodium ricotta or cottage cheese. Low-fat or nonfat yogurt. Low-fat, low-sodium cheese. Fats and oils Soft margarine without trans fats. Vegetable oil. Low-fat, reduced-fat, or light mayonnaise and salad dressings (reduced-sodium). Canola, safflower, olive, soybean, and sunflower oils. Avocado. Seasoning and other foods Herbs. Spices. Seasoning mixes without salt. Unsalted popcorn and pretzels. Fat-free sweets. What foods are not recommended? The items listed may not be a complete list. Talk with your dietitian about what dietary choices are best for you. Grains Baked goods  made with fat, such as croissants, muffins, or some breads. Dry pasta or rice meal packs. Vegetables Creamed or fried vegetables. Vegetables in a cheese sauce. Regular canned vegetables (not low-sodium or reduced-sodium). Regular canned tomato sauce and paste (not low-sodium or reduced-sodium). Regular tomato and vegetable juice (not low-sodium or reduced-sodium). Pickles. Olives. Fruits Canned fruit in a light or heavy syrup. Fried fruit. Fruit in cream or butter sauce. Meat and other protein foods Fatty cuts of meat. Ribs. Fried meat. Bacon. Sausage. Bologna and other processed lunch meats. Salami. Fatback. Hotdogs. Bratwurst. Salted nuts and seeds. Canned beans with added salt.   Canned or smoked fish. Whole eggs or egg yolks. Chicken or turkey with skin. Dairy Whole or 2% milk, cream, and half-and-half. Whole or full-fat cream cheese. Whole-fat or sweetened yogurt. Full-fat cheese. Nondairy creamers. Whipped toppings. Processed cheese and cheese spreads. Fats and oils Butter. Stick margarine. Lard. Shortening. Ghee. Bacon fat. Tropical oils, such as coconut, palm kernel, or palm oil. Seasoning and other foods Salted popcorn and pretzels. Onion salt, garlic salt, seasoned salt, table salt, and sea salt. Worcestershire sauce. Tartar sauce. Barbecue sauce. Teriyaki sauce. Soy sauce, including reduced-sodium. Steak sauce. Canned and packaged gravies. Fish sauce. Oyster sauce. Cocktail sauce. Horseradish that you find on the shelf. Ketchup. Mustard. Meat flavorings and tenderizers. Bouillon cubes. Hot sauce and Tabasco sauce. Premade or packaged marinades. Premade or packaged taco seasonings. Relishes. Regular salad dressings. Where to find more information:  National Heart, Lung, and Blood Institute: www.nhlbi.nih.gov  American Heart Association: www.heart.org Summary  The DASH eating plan is a healthy eating plan that has been shown to reduce high blood pressure (hypertension). It may also reduce  your risk for type 2 diabetes, heart disease, and stroke.  With the DASH eating plan, you should limit salt (sodium) intake to 2,300 mg a day. If you have hypertension, you may need to reduce your sodium intake to 1,500 mg a day.  When on the DASH eating plan, aim to eat more fresh fruits and vegetables, whole grains, lean proteins, low-fat dairy, and heart-healthy fats.  Work with your health care provider or diet and nutrition specialist (dietitian) to adjust your eating plan to your individual calorie needs. This information is not intended to replace advice given to you by your health care provider. Make sure you discuss any questions you have with your health care provider. Document Revised: 07/28/2017 Document Reviewed: 08/08/2016 Elsevier Patient Education  2020 Elsevier Inc.  

## 2020-04-14 ENCOUNTER — Ambulatory Visit: Payer: Self-pay | Admitting: Licensed Clinical Social Worker

## 2020-04-14 ENCOUNTER — Ambulatory Visit: Payer: Self-pay | Admitting: "Endocrinology

## 2020-04-14 ENCOUNTER — Other Ambulatory Visit: Payer: Self-pay

## 2020-04-14 VITALS — BP 126/83 | HR 92 | Ht 62.0 in | Wt 182.0 lb

## 2020-04-14 DIAGNOSIS — Z794 Long term (current) use of insulin: Secondary | ICD-10-CM

## 2020-04-14 DIAGNOSIS — E1140 Type 2 diabetes mellitus with diabetic neuropathy, unspecified: Secondary | ICD-10-CM

## 2020-04-14 DIAGNOSIS — F411 Generalized anxiety disorder: Secondary | ICD-10-CM

## 2020-04-14 DIAGNOSIS — F3341 Major depressive disorder, recurrent, in partial remission: Secondary | ICD-10-CM

## 2020-04-14 LAB — GLUCOSE, POCT (MANUAL RESULT ENTRY): POC Glucose: 150 mg/dl — AB (ref 70–99)

## 2020-04-14 MED ORDER — TRULICITY 1.5 MG/0.5ML ~~LOC~~ SOAJ
1.5000 mg | SUBCUTANEOUS | 3 refills | Status: DC
Start: 2020-04-14 — End: 2020-07-14

## 2020-04-14 NOTE — Progress Notes (Signed)
Follow up Diabetes/ Endocrine [facility]     Patient ID: Renee Bush, female   DOB: Jan 14, 1961, 59 y.o.   MRN: [id number] Assessment:  Renee Bush is a 59 y.o. female who is seen in follow up for T2DM at the request of Abate, Desta A, NP.  Encounter Diagnoses No diagnosis found.  Assessment  Renee Bush is 58-yo woman presenting for follow-up of complicated T2DM management. Six months ago she was switched from Lantus to an SGLT2i [facility]) for CKD benefit, but subsequently had an increase in A1c from 6.9 (March 2021) to 9.4 (June 2021). She was then started on Basaglar insulin. Given she still has room to increase current doses and her fasting glucose has increased to 140s-170s (previously 120-130s on Lantus), she would likely benefit from an increase in her Trulicity to 1.5mg  weekly. In the future could go off insulin if her A1c reaches target range (<7%). She reports no side effects after starting Invokana this year. If her UACR shows signs of microalbuminuria, her Invokana dosage could be increased as well. Her Glargine insulin was increased last week to 17 units from 14, so she is not recommended to change this today.   Plan:    1. Increase weekly Trulicity dose to 1.5mg   2. Order microalbumin:creatinine ratio to evaluate signs of microalbuminuria 3. Follow-up in 3 months 4. During episodes of drowsiness in the afternoons, take glucose and blood pressure to assess if these episodes are related to T2DM or HTN.     Patient Instructions  1. Follow up in 3 months 2. Increase Trulicity to 1.5mg  at next refill 3. Keep with 17 units unless fasting sugar < 90    No orders of the defined types were placed in this encounter.    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 Today she reports overall doing well, with morning fasting glucose readings in the 143-175  range. Her evening readings are around 200. She reports no hypoglycemic episodes, and is adherent to all medications as prescribed. Renee Bush does report episodes of drowsiness around 4-5pm, which have been new in the past few months. No fainting, tachycardia, or sweating, but feels hot (different from menopausal hot flashes) during these episodes. She hasn't taken glucose or blood pressure during the episodes.  Renee Bush is currently taking 0.75mg  Trulicity/week, 2000mg  Metformin daily, 100mg  Invokana, and 17 units of Basaglar. She was seen by endocrine in February 2021, when she was switched off Lantus and started on the SGLT2i (Invokana) given her CKD and overall good glucose control. Her A1c went from 6.9% in March 2021 to 9.4% in June 2021, and she was restarted on insulin (Glargine).   She saw ophthlamology in June 2021, who noted some fluid in her left eye. She has seen ophtho annually for the past several years.    Review of Systems  Constitutional: Positive for fatigue. Negative for appetite change, chills, diaphoresis, fever and unexpected weight change.  Eyes: Negative for photophobia, redness and visual disturbance.  Cardiovascular: Negative for palpitations and leg swelling.  Endocrine: Positive for polydipsia and polyuria.  Neurological: Negative for dizziness, tremors, syncope, weakness and light-headedness.    Renee Bush  has a past medical history of Chronic kidney disease, stage III (moderate) (10/06/2017), Diabetes mellitus without complication (HCC), GERD (gastroesophageal reflux disease), Hypercholesteremia, and Hypertension (09/10/2015).  Family History, Social History, current Medications and allergies reviewed  and updated in Epic.  Objective:    Blood pressure 126/83, pulse 92, height 5\' 2"  (1.575 m), weight 182 lb (82.6 kg), SpO2 95 %. Physical Exam Constitutional:      Appearance: Normal appearance.  Cardiovascular:     Rate and Rhythm: Normal rate and  regular rhythm.     Pulses: Normal pulses.     Heart sounds: Normal heart sounds.  Pulmonary:     Effort: Pulmonary effort is normal.     Breath sounds: Normal breath sounds.  Musculoskeletal:        General: No swelling, tenderness, deformity or signs of injury.     Right lower leg: No edema.     Left lower leg: No edema.  Skin:    General: Skin is warm and dry.  Neurological:     Mental Status: She is alert.         Data : I have personally reviewed pertinent labs and imaging studies, if indicated,  with the patient in clinic today.   Lab Orders  No laboratory test(s) ordered today    HC Readings from Last 3 Encounters:  No data found for [facility]    Wt Readings from Last 3 Encounters:  04/14/20 182 lb (82.6 kg)  04/07/20 180 lb (81.6 kg)  02/25/20 181 lb 9.6 oz (82.4 kg)

## 2020-04-14 NOTE — BH Specialist Note (Signed)
Integrated Behavioral Health Follow Up Visit Via Phone  MRN: [id number] Name: Renee Bush   Type of Service: Integrated Behavioral Health- Individual/Family Interpretor:No. Interpretor Name and Language: not applicable.   SUBJECTIVE: Renee Bush is a 59 y.o. female accompanied by herself. Patient was referred by Hurman Horn NP for mental health. Patient reports the following symptoms/concerns: She notes that she picked up the 90 mg of Cymbalta and has noticed a big difference. She explains that she is a whole lot calmer and is not reactive in stressful situations. She notes that she still has situational stressors with her husband but is able to walk away and ignore the antics. She denies suicidal and homicidal thoughts.  Duration of problem: ; Severity of problem: mild  OBJECTIVE: Mood: Euthymic and Affect: Appropriate Risk of harm to self or others: No plan to harm self or others  LIFE CONTEXT: Family and Social: see above. School/Work: see above. Self-Care: see above. Life Changes: see above.   GOALS ADDRESSED: Patient will: 1.  Reduce symptoms of: anxiety  2.  Increase knowledge and/or ability of: self-management skills and stress reduction  3.  Demonstrate ability to: Increase healthy adjustment to current life circumstances  INTERVENTIONS: Interventions utilized:  Supportive Counseling was utilized by the clinician during today's follow up session. Clinician processed with the patient regarding how she has been doing since the last follow up session. Clinician measured the patient's anxiety and depression on a numerical scale. Clinician asked the patient how she has been doing on the increased dosage of the Cymbalta. Clinician explained to the patient that it sounds like she has had a full turn around, is no longer feeling agitated, and as stressed as she was before. Clinician encouraged the patient to focus on what is in her control versus what is out of her  control.  Standardized Assessments completed: GAD-7 and PHQ 9 GAD 7 0 PHQ 9 6 ASSESSMENT: Patient currently experiencing see above. Patient may benefit from see above.  PLAN: 1. Follow up with behavioral health clinician on :  2. Behavioral recommendations:  3. Referral(s): Integrated [facility] (In Clinic) 4. "From scale of 1-10, how likely are you to follow plan?":   Althia Forts, LCSW

## 2020-04-14 NOTE — Patient Instructions (Signed)
1. Follow up in 3 months 2. Increase Trulicity to 1.5mg  at next refill 3. Keep with 17 units unless fasting sugar < 90

## 2020-04-15 ENCOUNTER — Telehealth: Payer: Self-pay | Admitting: Pharmacist

## 2020-04-15 NOTE — Telephone Encounter (Signed)
04/15/2020 1:19:45 PM - Trulicity renewal to pt & dr  -- [name] - Wednesday, April 15, 2020 1:18 PM --time for patient to renew with [facility] for Trulicity Inject 0.75mg  into the skin once weekly #4boxes. Sending forms to patient and dr to sign & return.

## 2020-05-12 ENCOUNTER — Ambulatory Visit: Payer: Self-pay | Admitting: Licensed Clinical Social Worker

## 2020-05-13 ENCOUNTER — Other Ambulatory Visit: Payer: Self-pay

## 2020-05-13 DIAGNOSIS — I10 Essential (primary) hypertension: Secondary | ICD-10-CM

## 2020-05-13 DIAGNOSIS — E1140 Type 2 diabetes mellitus with diabetic neuropathy, unspecified: Secondary | ICD-10-CM

## 2020-05-14 LAB — BASIC METABOLIC PANEL
BUN/Creatinine Ratio: 20 (ref 9–23)
BUN: 20 mg/dL (ref 6–24)
CO2: 25 mmol/L (ref 20–29)
Calcium: 9.7 mg/dL (ref 8.7–10.2)
Chloride: 99 mmol/L (ref 96–106)
Creatinine, Ser: 1 mg/dL (ref 0.57–1.00)
GFR calc Af Amer: 71 mL/min/{1.73_m2} (ref >59)
GFR calc non Af Amer: 62 mL/min/{1.73_m2} (ref >59)
Glucose: 131 mg/dL — ABNORMAL HIGH (ref 65–99)
Potassium: 4.1 mmol/L (ref 3.5–5.2)
Sodium: 138 mmol/L (ref 134–144)

## 2020-05-14 LAB — MICROALBUMIN / CREATININE URINE RATIO
Creatinine, Urine: 56.9 mg/dL
Microalb/Creat Ratio: 11 mg/g creat (ref 0–29)
Microalbumin, Urine: 6.5 ug/mL

## 2020-05-14 LAB — HEMOGLOBIN A1C
Est. average glucose Bld gHb Est-mCnc: 174 mg/dL
Hgb A1c MFr Bld: 7.7 % — ABNORMAL HIGH (ref 4.8–5.6)

## 2020-05-19 ENCOUNTER — Other Ambulatory Visit: Payer: Self-pay

## 2020-05-19 ENCOUNTER — Encounter: Payer: Self-pay | Admitting: Licensed Clinical Social Worker

## 2020-05-19 ENCOUNTER — Ambulatory Visit: Payer: Self-pay | Admitting: Licensed Clinical Social Worker

## 2020-05-19 DIAGNOSIS — F3341 Major depressive disorder, recurrent, in partial remission: Secondary | ICD-10-CM

## 2020-05-19 DIAGNOSIS — F411 Generalized anxiety disorder: Secondary | ICD-10-CM

## 2020-05-19 NOTE — BH Specialist Note (Signed)
Integrated Behavioral Health Follow Up Visit  MRN: [id number] Name: Renee Bush    Type of Service: Integrated Behavioral Health- Individual/Family Interpretor:No. Interpretor Name and Language: none  SUBJECTIVE: Renee Bush is a 59 y.o. female accompanied by herself Patient was referred by Hurman Horn, NP for mental health Patient reports the following symptoms/concerns: The patient reports that things "have been going good." Patient states that the Cymbalta is doing good and doing its job. She states that the increased dosage has helped a lot.  Patient babysit her two year old grandchild five days a week. Then she goes to care for four more grandchildren after school. She says she doesn't have time to feel down. She reports that she is doing great. However, she stated she misses her previous clinician and wishes her well. The patient denied any suicidal or homicidal thoughts.  Duration of problem: ; Severity of problem: moderate  OBJECTIVE: Mood: Euthymic and Affect: Appropriate Risk of harm to self or others: No plan to harm self or others  LIFE CONTEXT: Family and Social: see above School/Work: see above Self-Care: see above Life Changes: see above  GOALS ADDRESSED: Patient will: 1.  Reduce symptoms of: stress  2.  Increase knowledge and/or ability of: self-management skills  3.  Demonstrate ability to: Increase healthy adjustment to current life circumstances  INTERVENTIONS: Interventions utilized:  Supportive Counseling was utilized by clinician during today's follow up session. Clinician processed with patient how she has been doing since her last follow up appointment with Carey Bullocks, LCSW. Clinician utilized reflective listening and provided space for the patient to ventilate about stressors in her life. Clinician measured the patients anxiety and depression on a numerical scale. Clinician reassured patient that it is normal to miss a previous  clinician and offerred to pass the patients well wishes to her.   Standardized Assessments completed: GAD-7 and PHQ 9  Gad -7 9 PHQ- 9 9  ASSESSMENT: Patient currently experiencing see above  Patient may benefit from see above  PLAN: 1. Follow up with behavioral health clinician on : 06/03/2020 at 4:30 2. Behavioral recommendations:  3. Referral(s): Integrated [facility] [facility] (In Clinic) 4. "From scale of 1-10, how likely are you to follow plan?":   Judith Part, Student-Social Work

## 2020-05-20 ENCOUNTER — Other Ambulatory Visit: Payer: Self-pay

## 2020-05-20 ENCOUNTER — Encounter: Payer: Self-pay | Admitting: Gerontology

## 2020-05-20 ENCOUNTER — Ambulatory Visit: Payer: Self-pay | Admitting: Gerontology

## 2020-05-20 ENCOUNTER — Telehealth: Payer: Self-pay | Admitting: Pharmacist

## 2020-05-20 VITALS — BP 129/85 | HR 89 | Ht 62.0 in | Wt 180.0 lb

## 2020-05-20 DIAGNOSIS — E7800 Pure hypercholesterolemia, unspecified: Secondary | ICD-10-CM

## 2020-05-20 DIAGNOSIS — I10 Essential (primary) hypertension: Secondary | ICD-10-CM

## 2020-05-20 DIAGNOSIS — Z794 Long term (current) use of insulin: Secondary | ICD-10-CM

## 2020-05-20 MED ORDER — GABAPENTIN 400 MG PO CAPS: ORAL_CAPSULE | ORAL | 1 refills | Status: DC

## 2020-05-20 MED ORDER — ROSUVASTATIN CALCIUM 10 MG PO TABS: ORAL_TABLET | ORAL | 2 refills | Status: DC

## 2020-05-20 NOTE — Progress Notes (Signed)
Established Patient Office Visit  Subjective:  Patient ID: Renee Bush, female    DOB: April 10, 1961  Age: 59 y.o. MRN: [id number]  CC:  Chief Complaint  Patient presents with  . Diabetes    BS 126 this morning    HPI ORIS CALMES presents for type 2 diabetes mellitus follow up and lab review. Her Agb A1c in 9/15 was 7.7% (down from 9.4% in June 2021). Her goal is less than 7%. Her diabetes medications were increased from her visit with Endocrinology on 04/14/20. She is compliant with Trulicity 1.5mg  subcutaneous weekly, Invokana 100mg  daily, metformin 500mg  twice daily with meals and Glargine 17 units daily at bedtime. She states that she checks her blood glucose once a day in the morning and it ranges from 83-160, with this morning's being 126. She denies hypoglycemic episodes, peripheral neuropathy symptoms, or polyuria, polyphagia, or polydipsia. Her hypertension is controlled on lisinopril 10mg  PO daily and metoprolol 75mg  PO twice daily. She states that she only checks her blood pressure when she feels like it is high. Her creatinine has decreased from 1.16 on 03/2020 to 1.00 on 05/13/2020. She reports that she has increased her water intake and has been eating a lot of salads. She is compliant with her Crestor 5mg  daily and denies complications or muscle cramps or weakness. She denies any further complaint and states that she is doing well.   Past Medical History:  Diagnosis Date  . Chronic kidney disease, stage III (moderate) 10/06/2017  . Diabetes mellitus without complication (HCC)   . GERD (gastroesophageal reflux disease)   . Hypercholesteremia   . Hypertension 09/10/2015    Past Surgical History:  Procedure Laterality Date  . APPENDECTOMY    . CESAREAN SECTION  1991  . COLONOSCOPY WITH PROPOFOL N/A 06/03/2019   Procedure: COLONOSCOPY WITH PROPOFOL;  Surgeon: 05/15/2020, MD;  Location: [facility] ENDOSCOPY;  Service: Gastroenterology;  Laterality: N/A;    Family History   Problem Relation Age of Onset  . COPD Mother   . Alzheimer's disease Mother   . Hypertension Father   . Hyperlipidemia Father   . Heart attack Father   . Diabetes type II Father   . Diabetes type II Sister   . Diabetes type II Sister   . Gestational diabetes Daughter   . Diabetes type II Sister     Social History   Socioeconomic History  . Marital status: Married    Spouse name: Not on file  . Number of children: 4  . Years of education: Not on file  . Highest education level: 11th grade  Occupational History  . Occupation: na  Tobacco Use  . Smoking status: Former Smoker    Types: Cigarettes    Quit date: 04/17/2005    Years since quitting: 15.1  . Smokeless tobacco: Never Used  Vaping Use  . Vaping Use: Never used  Substance and Sexual Activity  . Alcohol use: No    Alcohol/week: 14.0 standard drinks    Types: 14 Cans of beer per week    Comment: quit ~56mo ago  . Drug use: No  . Sexual activity: Not on file  Other Topics Concern  . Not on file  Social History Narrative   Lives in mobile home paid for   Social Determinants of Health   Financial Resource Strain: Low Risk   . Difficulty of Paying Living Expenses: Not hard at all  Food Insecurity: No Food Insecurity  . Worried About 08/03/2019 of  Food in the Last Year: Never true  . Ran Out of Food in the Last Year: Never true  Transportation Needs: No Transportation Needs  . Lack of Transportation (Medical): No  . Lack of Transportation (Non-Medical): No  Physical Activity: Insufficiently Active  . Days of Exercise per Week: 7 days  . Minutes of Exercise per Session: 10 min  Stress:   . Feeling of Stress : Not on file  Social Connections: Unknown  . Frequency of Communication with Friends and Family: More than three times a week  . Frequency of Social Gatherings with Friends and Family: Once a week  . Attends Religious Services: Not on file  . Active Member of Clubs or Organizations: Not on file  .  Attends Banker Meetings: Not on file  . Marital Status: Not on file  Intimate Partner Violence: Not At Risk  . Fear of Current or Ex-Partner: No  . Emotionally Abused: No  . Physically Abused: No  . Sexually Abused: No    Outpatient Medications Prior to Visit  Medication Sig Dispense Refill  . aspirin EC 81 MG tablet Take 1 tablet (81 mg total) by mouth daily.    . canagliflozin (INVOKANA) 100 MG TABS tablet Take 1 tablet (100 mg total) by mouth daily before breakfast. 90 tablet 0  . cetirizine (ZYRTEC) 10 MG tablet Take 1 tablet (10 mg total) by mouth daily. 30 tablet 2  . cyanocobalamin (,VITAMIN B-12,) 1000 MCG/ML injection Inject 1 mL (1,000 mcg total) into the muscle every 30 (thirty) days. 1 mL 12  . Dulaglutide (TRULICITY) 1.5 MG/0.5ML SOPN Inject 0.5 mLs (1.5 mg total) into the skin once a week. 6 mL 3  . DULoxetine (CYMBALTA) 30 MG capsule Take 3 capsules (90 mg total) by mouth daily. 90 capsule 1  . ferrous sulfate 325 (65 FE) MG tablet TAKE ONE TABLET BY MOUTH EVERY DAY WITH BREAKFAST. 30 tablet 2  . fluticasone (FLONASE) 50 MCG/ACT nasal spray Place 1 spray into both nostrils daily. 1 g 1  . Insulin Glargine (BASAGLAR KWIKPEN) 100 UNIT/ML Inject 0.17 mLs (17 Units total) into the skin at bedtime. 3 mL 4  . lisinopril (ZESTRIL) 10 MG tablet Take 1 tablet (10 mg total) by mouth daily. 90 tablet 0  . metFORMIN (GLUCOPHAGE) 1000 MG tablet Take 0.5 tablets (500 mg total) by mouth 2 (two) times daily with a meal. 60 tablet 2  . metoprolol tartrate (LOPRESSOR) 50 MG tablet TAKE 1 1/2 TABLETS (75mg ) 2 TIMES A DAY 90 tablet 0  . montelukast (SINGULAIR) 10 MG tablet Take 1 tablet (10 mg total) by mouth at bedtime. 90 tablet 0  . nitroGLYCERIN (NITROSTAT) 0.4 MG SL tablet 1 TABLET UNDER TONGUE AS NEEDED FOR CHEST PAIN EVERY 5 MINUTES FOR MAX OF 3 DOSES IN 15 MINUTES; IF NO RELIEF AFTER 1ST DOSE CALL 911 25 tablet 0  . Omega-3 Fatty Acids (FISH OIL) 1000 MG CAPS Take 1,000 mg  by mouth 2 (two) times daily.    omeprazole (PRILOSEC) 20 MG capsule TAKE ONE CAPSULE BY MOUTH 2 TIMES ADAY BEFORE A MEAL 120 capsule 1  . sodium chloride (OCEAN) 0.65 % SOLN nasal spray Place 1 spray into both nostrils as needed for congestion. 15 mL 0  . gabapentin (NEURONTIN) 400 MG capsule Take 1 capsule twice a day and 2 capsules at bedtime 120 capsule 0  . rosuvastatin (CRESTOR) 10 MG tablet TAKE 1/2 TABLET (5 mg) BY MOUTH EVERY DAY 30 tablet 2  No facility-administered medications prior to visit.    Allergies  Allergen Reactions  . Etodolac Rash  . Penicillin G Rash  . Penicillins Rash    Has patient had a PCN reaction causing immediate rash, facial/tongue/throat swelling, SOB or lightheadedness with hypotension: Yes Has patient had a PCN reaction causing severe rash involving mucus membranes or skin necrosis: No Has patient had a PCN reaction that required hospitalization: No Has patient had a PCN reaction occurring within the last 10 years: No If all of the above answers are "NO", then may proceed with Cephalosporin use.    ROS Review of Systems  Constitutional: Negative.   Respiratory: Negative.   Cardiovascular: Negative.   Gastrointestinal: Negative.   Endocrine: Negative.  Negative for polydipsia, polyphagia and polyuria.  Psychiatric/Behavioral: Negative.       Objective:    Physical Exam Constitutional:      Appearance: Normal appearance.  Cardiovascular:     Rate and Rhythm: Normal rate and regular rhythm.     Heart sounds: Normal heart sounds.  Pulmonary:     Effort: Pulmonary effort is normal.     Breath sounds: Normal breath sounds.  Abdominal:     General: Bowel sounds are normal.     Palpations: Abdomen is soft.  Feet:     Right foot:     Skin integrity: Skin integrity normal.     Toenail Condition: Right toenails are normal.     Left foot:     Skin integrity: Skin integrity normal.     Toenail Condition: Left toenails are normal.      Comments: Yearly diabetic foot exam performed and no neurovascular abnormalities are noted.  Skin:    General: Skin is warm and dry.  Neurological:     General: No focal deficit present.     Mental Status: She is alert and oriented to person, place, and time.  Psychiatric:        Mood and Affect: Mood normal.     BP 129/85 (BP Location: Left Arm, Patient Position: Sitting)   Pulse 89   Ht 5\' 2"  (1.575 m)   Wt 180 lb (81.6 kg)   SpO2 97%   BMI 32.92 kg/m  Wt Readings from Last 3 Encounters:  05/20/20 180 lb (81.6 kg)  04/14/20 182 lb (82.6 kg)  04/07/20 180 lb (81.6 kg)     Health Maintenance Due  Topic Date Due  . Hepatitis C Screening  Never done  . HIV Screening  Never done  . INFLUENZA VACCINE  03/29/2020    There are no preventive care reminders to display for this patient.  Lab Results  Component Value Date   TSH 0.763 11/09/2017   Lab Results  Component Value Date   WBC 7.8 12/18/2019   HGB 13.8 12/18/2019   HCT 40.6 12/18/2019   MCV 92 12/18/2019   PLT 209 12/18/2019   Lab Results  Component Value Date   NA 138 05/13/2020   K 4.1 05/13/2020   CO2 25 05/13/2020   GLUCOSE 131 (H) 05/13/2020   BUN 20 05/13/2020   CREATININE 1.00 05/13/2020   BILITOT 0.3 12/18/2019   ALKPHOS 64 12/18/2019   AST 10 12/18/2019   ALT 10 12/18/2019   PROT 6.2 12/18/2019   ALBUMIN 4.2 12/18/2019   CALCIUM 9.7 05/13/2020   ANIONGAP 12 08/10/2019   Lab Results  Component Value Date   CHOL 125 08/28/2019   Lab Results  Component Value Date   HDL 42 08/28/2019  Lab Results  Component Value Date   LDLCALC 61 08/28/2019   Lab Results  Component Value Date   TRIG 125 08/28/2019   Lab Results  Component Value Date   CHOLHDL 3.0 08/28/2019   Lab Results  Component Value Date   HGBA1C 7.7 (H) 05/13/2020      Assessment & Plan:   1. Type 2 diabetes mellitus with diabetic neuropathy, with long-term current use of insulin (HCC) Continue your medications as  prescribed and logging your blood glucose readings. Continue your diet changes and adhere to a low carb/low concentrated sugar diet. Continue your gabapentin for managing and preventing your peripheral neuropathy symptoms. Return on 12/8 for A1c recheck.  - gabapentin (NEURONTIN) 400 MG capsule; Take 1 capsule twice a day and 2 capsules at bedtime  Dispense: 120 capsule; Refill: 1 - HgB A1c; Future  2. Hypercholesteremia Continue you Crestor as prescribed. Adhere to a low fat, low cholesterol diet.   - rosuvastatin (CRESTOR) 10 MG tablet; TAKE 1/2 TABLET (5 mg) BY MOUTH EVERY DAY  Dispense: 30 tablet; Refill: 2  3. Essential hypertension Continue your medications and DASH diet as prescribed. Check your blood pressure regularily and keep a log for future appointments.    Follow-up: Return in about 3 months (around 08/19/2020), or if symptoms worsen or fail to improve.    Kathlene NovemberJake Knowledge Escandon, Student-NP

## 2020-05-20 NOTE — Telephone Encounter (Signed)
05/20/2020 2:03:44 PM - Trulicity renewal & dose change to pt & dr  -- [name] - Wednesday, May 20, 2020 2:01 PM --Received a pharmacy printout for Trulicity 1.5mg /0.5 mLs Inject 0.5 mLs under the skin once a week-also patient enrollment with [employer] ends in Oct. Printed Renewal application-put patient portion in bag with meds on the wall for patient to sign, sending provider portion to [facility] for [place] (different provider per note from [facility]) to sign.

## 2020-05-20 NOTE — Patient Instructions (Signed)
Carbohydrate Counting for Diabetes Mellitus, Adult  Carbohydrate counting is a method of keeping track of how many carbohydrates you eat. Eating carbohydrates naturally increases the amount of sugar (glucose) in the blood. Counting how many carbohydrates you eat helps keep your blood glucose within normal limits, which helps you manage your diabetes (diabetes mellitus). It is important to know how many carbohydrates you can safely have in each meal. This is different for every person. A diet and nutrition specialist (registered dietitian) can help you make a meal plan and calculate how many carbohydrates you should have at each meal and snack. Carbohydrates are found in the following foods:  Grains, such as breads and cereals.  Dried beans and soy products.  Starchy vegetables, such as potatoes, peas, and corn.  Fruit and fruit juices.  Milk and yogurt.  Sweets and snack foods, such as cake, cookies, candy, chips, and soft drinks. How do I count carbohydrates? There are two ways to count carbohydrates in food. You can use either of the methods or a combination of both. Reading "Nutrition Facts" on packaged food The "Nutrition Facts" list is included on the labels of almost all packaged foods and beverages in the [place] It includes:  The serving size.  Information about nutrients in each serving, including the grams (g) of carbohydrate per serving. To use the "Nutrition Facts":  Decide how many servings you will have.  Multiply the number of servings by the number of carbohydrates per serving.  The resulting number is the total amount of carbohydrates that you will be having. Learning standard serving sizes of other foods When you eat carbohydrate foods that are not packaged or do not include "Nutrition Facts" on the label, you need to measure the servings in order to count the amount of carbohydrates:  Measure the foods that you will eat with a food scale or measuring cup, if  needed.  Decide how many standard-size servings you will eat.  Multiply the number of servings by 15. Most carbohydrate-rich foods have about 15 g of carbohydrates per serving. ? For example, if you eat 8 oz (170 g) of strawberries, you will have eaten 2 servings and 30 g of carbohydrates (2 servings x 15 g = 30 g).  For foods that have more than one food mixed, such as soups and casseroles, you must count the carbohydrates in each food that is included. The following list contains standard serving sizes of common carbohydrate-rich foods. Each of these servings has about 15 g of carbohydrates:   hamburger bun or  English muffin.   oz (15 mL) syrup.   oz (14 g) jelly.  1 slice of bread.  1 six-inch tortilla.  3 oz (85 g) cooked rice or pasta.  4 oz (113 g) cooked dried beans.  4 oz (113 g) starchy vegetable, such as peas, corn, or potatoes.  4 oz (113 g) hot cereal.  4 oz (113 g) mashed potatoes or  of a large baked potato.  4 oz (113 g) canned or frozen fruit.  4 oz (120 mL) fruit juice.  4-6 crackers.  6 chicken nuggets.  6 oz (170 g) unsweetened dry cereal.  6 oz (170 g) plain fat-free yogurt or yogurt sweetened with artificial sweeteners.  8 oz (240 mL) milk.  8 oz (170 g) fresh fruit or one small piece of fruit.  24 oz (680 g) popped popcorn. Example of carbohydrate counting Sample meal  3 oz (85 g) chicken breast.  6 oz (170 g)   brown rice.  4 oz (113 g) corn.  8 oz (240 mL) milk.  8 oz (170 g) strawberries with sugar-free whipped topping. Carbohydrate calculation 1. Identify the foods that contain carbohydrates: ? Rice. ? Corn. ? Milk. ? Strawberries. 2. Calculate how many servings you have of each food: ? 2 servings rice. ? 1 serving corn. ? 1 serving milk. ? 1 serving strawberries. 3. Multiply each number of servings by 15 g: ? 2 servings rice x 15 g = 30 g. ? 1 serving corn x 15 g = 15 g. ? 1 serving milk x 15 g = 15 g. ? 1  serving strawberries x 15 g = 15 g. 4. Add together all of the amounts to find the total grams of carbohydrates eaten: ? 30 g + 15 g + 15 g + 15 g = 75 g of carbohydrates total. Summary  Carbohydrate counting is a method of keeping track of how many carbohydrates you eat.  Eating carbohydrates naturally increases the amount of sugar (glucose) in the blood.  Counting how many carbohydrates you eat helps keep your blood glucose within normal limits, which helps you manage your diabetes.  A diet and nutrition specialist (registered dietitian) can help you make a meal plan and calculate how many carbohydrates you should have at each meal and snack. This information is not intended to replace advice given to you by your health care provider. Make sure you discuss any questions you have with your health care provider. Document Revised: 03/09/2017 Document Reviewed: 01/27/2016 Elsevier Patient Education  2020 [employer]  

## 2020-05-20 NOTE — Progress Notes (Signed)
This encounter was created in error - please disregard.

## 2020-05-28 ENCOUNTER — Telehealth: Payer: Self-pay | Admitting: Gerontology

## 2020-05-28 NOTE — Telephone Encounter (Signed)
Called and completed the social determinants of health questions of two categories that were incomplete.

## 2020-06-02 ENCOUNTER — Other Ambulatory Visit: Payer: Self-pay

## 2020-06-02 ENCOUNTER — Ambulatory Visit: Payer: Self-pay | Admitting: Licensed Clinical Social Worker

## 2020-06-02 DIAGNOSIS — F3341 Major depressive disorder, recurrent, in partial remission: Secondary | ICD-10-CM

## 2020-06-02 MED ORDER — DULOXETINE HCL 30 MG PO CPEP: 90.0000 mg | ORAL_CAPSULE | Freq: Every day | ORAL | 1 refills | Status: DC

## 2020-06-02 NOTE — BH Specialist Note (Signed)
Integrated Behavioral Health Follow Up Visit  MRN: [id number] Name: Renee Bush   Type of Service: Integrated Behavioral Health- Individual/Family Interpretor:No. Interpretor Name and Language: none   SUBJECTIVE: Renee Bush is a 59 y.o. female accompanied by herself Patient was referred by Renee Horn, NP for mental health . Patient reports the following symptoms/concerns: The patient reports that she has been doing okay since her last session. She states she has been busy watching all her grandchildren. The patient denies experiencing symptoms of anxiety and depression. She feels that her daughter moving into her own place has helped. The patient also stated her medications are working well for her. The patient is enjoying a quiet time over the least three weeks with less stress. The patient denies suicidal or homicidal thoughts.  Duration of problem:; Severity of problem: moderate  OBJECTIVE: Mood: Euthymic and Affect: Appropriate Risk of harm to self or others: No plan to harm self or others  LIFE CONTEXT: Family and Social: see above School/Work: see above Self-Care: see above Life Changes: see above   GOALS ADDRESSED: Patient will: 1.  Reduce symptoms of: anxiety and depression  2.  Increase knowledge and/or ability of: coping skills  3.  Demonstrate ability to: Increase healthy adjustment to current life circumstances  INTERVENTIONS: Interventions utilized:  Supportive Counseling was utilized by the clinician during today's session. Clinician processed with the patient regarding how she has been doing since the last follow up session. Clinician measured the patient's anxiety and depression on a numerical scale. Clinician asked the patient what is causing her to feeling less anxious and agitated. The clinician congratulated the patient on her progress over the last month. The clinician encouraged the client to continue to use her coping skills and self care.   Standardized Assessments completed: GAD-7 and PHQ 9  Gad 7  4 PHQ 9 7   ASSESSMENT: Patient currently experiencing see above  Patient may benefit from see above  PLAN: 1. Follow up with behavioral health clinician on : 06/23/2020 at 5:00 pm  2. Behavioral recommendations:  3. Referral(s): Integrated [employer] (In Clinic) 4. "From scale of 1-10, how likely are you to follow plan?":  Renee Bush, Student-Social Work

## 2020-06-18 ENCOUNTER — Other Ambulatory Visit: Payer: Self-pay | Admitting: Gerontology

## 2020-06-18 DIAGNOSIS — Z889 Allergy status to unspecified drugs, medicaments and biological substances status: Secondary | ICD-10-CM

## 2020-06-18 DIAGNOSIS — J0110 Acute frontal sinusitis, unspecified: Secondary | ICD-10-CM

## 2020-06-23 ENCOUNTER — Ambulatory Visit: Payer: Self-pay | Admitting: Licensed Clinical Social Worker

## 2020-06-23 ENCOUNTER — Other Ambulatory Visit: Payer: Self-pay

## 2020-06-23 DIAGNOSIS — F3341 Major depressive disorder, recurrent, in partial remission: Secondary | ICD-10-CM

## 2020-06-23 NOTE — BH Specialist Note (Signed)
Integrated Behavioral Health Follow Up Visit  MRN: [id number] Name: Renee Bush    Type of Service: Integrated Behavioral Health- Individual/Family Interpretor:No. Interpretor Name and Language: none  SUBJECTIVE: Renee Bush is a 59 y.o. female accompanied by herself  Patient was referred by Hurman Horn, NP  for mental health. Patient reports the following symptoms/concerns: The patient reports that her son - law-has been home form work so she has been getting a break from babysitting her grandchildren.The patient reports that she has been enjoying watching her programs on TV. She states that she is staying at home because she can not afford the price of gas right now. She notes that she is doing much better now that she can rest and that  stress in her life is a lot lower since her daughter and grandchildren have moved out. The patient discussed other financial and familial stressors. The patient denied homicidal or suicidal thoughts.  Duration of problem:  ; Severity of problem: moderate  OBJECTIVE: Mood: Euthymic and Affect: Appropriate Risk of harm to self or others: No plan to harm self or others  LIFE CONTEXT: Family and Social: see above School/Work: see above Self-Care: see above Life Changes: see above  GOALS ADDRESSED: Patient will: 1.  Reduce symptoms of: anxiety and depression  2.  Increase knowledge and/or ability of: coping skills, healthy habits and stress reduction  3.  Demonstrate ability to: Increase healthy adjustment to current life circumstances  INTERVENTIONS: Interventions utilized: Supportive Counseling was utilized by the clinician during today's follow up session. Clinician processed with the patient regarding how she has been doing since the last follow up session.Clinician encouraged patient to continue to use her coping skills and practice self care. Further, the clinician encouraged the patient to adhere to the care plan by taking her  medication as prescribed. Clinician measured the patients anxiety and depression on a numerical scale. The clinician congratulated the patient for her progress and for utilizing her coping skills to deal with her current life circumstances.  Standardized Assessments completed: GAD-7 and PHQ 9  Gad-7   11 PHQ-9  8  ASSESSMENT: Patient currently experiencing see above.   Patient may benefit from see above  PLAN: 1. Follow up with behavioral health clinician on :07/15/2020 at 2:00 PM  2. Behavioral recommendations: see above 3. Referral(s): Integrated [employer] (In Clinic) 4. "From scale of 1-10, how likely are you to follow plan?" N/A  Judith Part, Student-Social Work

## 2020-07-02 ENCOUNTER — Other Ambulatory Visit: Payer: Self-pay

## 2020-07-02 DIAGNOSIS — R350 Frequency of micturition: Secondary | ICD-10-CM

## 2020-07-02 HISTORY — DX: Frequency of micturition: R35.0

## 2020-07-02 NOTE — Progress Notes (Signed)
Renee Bush received her monthly B12 injection to right deltoid and she tolerated. She also c/o of vaginal itching, urinary frequency and urgency that started 1 week ago. She denies dysuria, flank pain, vaginal discharge, fever and chills.

## 2020-07-14 ENCOUNTER — Other Ambulatory Visit: Payer: Self-pay

## 2020-07-14 ENCOUNTER — Ambulatory Visit: Payer: Self-pay | Admitting: Internal Medicine

## 2020-07-14 DIAGNOSIS — Z794 Long term (current) use of insulin: Secondary | ICD-10-CM

## 2020-07-14 DIAGNOSIS — E1140 Type 2 diabetes mellitus with diabetic neuropathy, unspecified: Secondary | ICD-10-CM

## 2020-07-14 MED ORDER — TRULICITY 3 MG/0.5ML ~~LOC~~ SOAJ: 3.0000 mg | SUBCUTANEOUS | 2 refills | Status: DC

## 2020-07-14 MED ORDER — BASAGLAR KWIKPEN 100 UNIT/ML ~~LOC~~ SOPN: 15.0000 [IU] | PEN_INJECTOR | Freq: Every day | SUBCUTANEOUS | 4 refills | Status: DC

## 2020-07-14 NOTE — Patient Instructions (Addendum)
1. Increase dose on Trulicity to 3mg  and pick up additional test strips.  2. Decrease glargine from 17 to 15 units 3. Call clinic if you have any symptoms of shakes/chills, passing out or dizziness  4. Follow up in 3 months

## 2020-07-14 NOTE — Progress Notes (Addendum)
Follow up Diabetes/ Endocrine [facility]     Patient ID: TAEGEN Renee Bush, female   DOB: 11-12-1960, 59 y.o.   MRN: [id number] Assessment/Plan:  Renee Bush is a 59 y.o. female who is seen in follow up for T2DM at the request of Abate, Desta A, NP. In terms of diabetes care management, she has been checking her blood glucose in the mornings, which has been averaging 100-160 mg/dl and she checks her blood pressure 4 times a month, averaging 130/79. She has been rationing test strips so more should be picked up from [facility]. The increase in Trulicity dose to 4.4RX was well tolerated, without symptoms of hypoglycemia or hyperglycemia. She had an eye exam recently and she checks her feet regularly. To further decrease her HbA1c closer to target, we increased her Trulicity dose to 71m and decreased her Glargine units from 17 to 15 units to prevent any hypoglycemic episodes. We provided council about calling the clinic if she has any symptoms of hypoglycemia.    Patient Instructions  1. Increase dose on Trulicity to 31m 2. Decrease glargine from 17 to 15 units 3. Call clinic if you have any symptoms of shakes/chills, passing out or dizziness  4. Follow up in 3 months     I agree with the assessment and plan as documented in the student's note. Patient was advised of potential benefits/risks/SE of therapy and reasons to contact usKoreaefore next visit. [name]   No orders of the defined types were placed in this encounter.    Subjective:  Renee Bush a 89ear old woman who is being seen for a 3 month follow up for T2DM. Her HbA1c decreased from 9.4 on June 2021 to 7.7 on 9/15. Her urine ACR was 11 on 9/15. Her increase in weekly Trulicity dose to 1.5.4MGas tolerated well with no symptoms of hypoglycemia, such as shakes/chills, feeling like passing out or dizziness. Her last visit she stated she had drowsiness in the afternoon, which has resolved  currently. She checks fasting blood glucose every other day to make the test strips last and has been averaging 100-160 mg/dL. She checks her blood pressure about 4 times a month when she gets headaches or feels like her blood pressure is high, which averages 130/79. Her last eye exam was 4 months ago, which was normal. She denies symptoms of hyperglycemia, such as polydipsia or polyuria. She checks her feet regularly for deformities. She stated a decrease in appetite for about a month, with her diet consisting of "whatever is available", such as mac and cheese, tuna, green beans and corn. She has been exercising with her 1 42ear old granddaughter. She does not smoke tobacco, use alcohol or other drugs.     Review of Systems  Constitutional: Negative for fatigue and unexpected weight change.  HENT: Negative for trouble swallowing.   Eyes: Negative for visual disturbance.  Respiratory: Negative for shortness of breath.   Cardiovascular: Negative for chest pain.  Gastrointestinal: Negative for nausea and vomiting.  Endocrine: Negative for polydipsia and polyuria.  Genitourinary: Negative for frequency.  Neurological: Positive for headaches. Negative for dizziness and numbness.    CaJIRAH RIDERhas a past medical history of Chronic kidney disease, stage III (moderate) ([facility](10/06/2017), Diabetes mellitus without complication ([facility] GERD (gastroesophageal reflux disease), Hypercholesteremia, and Hypertension (09/10/2015).  Family History, Social History, current Medications and allergies reviewed and updated in Epic.  Objective:     There were  no vitals taken for this visit. Physical Exam Constitutional:      Appearance: Normal appearance.  Cardiovascular:     Rate and Rhythm: Normal rate and regular rhythm.     Pulses: Normal pulses.          Carotid pulses are 2+ on the right side and 2+ on the left side.      Radial pulses are 2+ on the right side and 2+ on the left side.        Dorsalis pedis pulses are 2+ on the right side and 2+ on the left side.       Posterior tibial pulses are 2+ on the right side and 2+ on the left side.     Heart sounds: Normal heart sounds, S1 normal and S2 normal.  Pulmonary:     Effort: Pulmonary effort is normal.     Breath sounds: Normal breath sounds. No wheezing, rhonchi or rales.  Abdominal:     Comments: Normal injection sites   Musculoskeletal:     Right lower leg: No edema.     Left lower leg: No edema.  Feet:     Right foot:     Skin integrity: Skin integrity normal.     Toenail Condition: Right toenails are normal.     Left foot:     Skin integrity: Skin integrity normal.     Toenail Condition: Left toenails are normal.  Neurological:     Mental Status: She is alert.         Data : I have personally reviewed pertinent labs and imaging studies, if indicated,  with the patient in clinic today.   Lab Orders  No laboratory test(s) ordered today    HC Readings from Last 3 Encounters:  No data found for [facility]    Wt Readings from Last 3 Encounters:  07/14/20 177 lb (80.3 kg)  05/20/20 180 lb (81.6 kg)  04/14/20 182 lb (82.6 kg)

## 2020-07-15 ENCOUNTER — Other Ambulatory Visit: Payer: Self-pay | Admitting: Gerontology

## 2020-07-15 ENCOUNTER — Ambulatory Visit: Payer: Self-pay | Admitting: Licensed Clinical Social Worker

## 2020-07-15 DIAGNOSIS — E1140 Type 2 diabetes mellitus with diabetic neuropathy, unspecified: Secondary | ICD-10-CM

## 2020-07-15 DIAGNOSIS — F3341 Major depressive disorder, recurrent, in partial remission: Secondary | ICD-10-CM

## 2020-07-15 DIAGNOSIS — Z794 Long term (current) use of insulin: Secondary | ICD-10-CM

## 2020-07-15 MED ORDER — GABAPENTIN 400 MG PO CAPS: ORAL_CAPSULE | ORAL | 1 refills | Status: DC

## 2020-07-15 NOTE — BH Specialist Note (Signed)
Integrated Behavioral Health Follow Up Visit  MRN: [id number] Name: Renee Bush    Type of Service: Integrated Behavioral Health- Individual/Family Interpretor:No. Interpretor Name and Language:   SUBJECTIVE: Renee Bush is a 59 y.o. female accompanied by herself  Patient was referred by Hurman Horn, NP  For mental health. Patient reports the following symptoms/concerns: The patient states that she is looking forward to Thanksgiving. She is planning to cook a large meal for her family. She states she expects many people to attend including her children, grandchildren, and mother in law. The patient states that she is occasionally babysitting her grandchildren now instead of everyday. She reports taking Cymbalta 90 MG daily, as prescribed, and feels that it is helping her. She notes that she does not have a big appetite which states is typical for her. She denies any difficulty with sleep and feels that she has more time to relax and do things she enjoys such as watch Television programs. The patient denied any suicidal or homicidal thoughts.  Duration of problem: ; Severity of problem: moderate  OBJECTIVE: Mood: Euthymic and Affect: Appropriate Risk of harm to self or others: No plan to harm self or others  LIFE CONTEXT: Family and Social: see above School/Work: see above Self-Care: see above Life Changes: see above   GOALS ADDRESSED: Patient will: 1.  Reduce symptoms of: anxiety and depression  2.  Increase knowledge and/or ability of: coping skills, healthy habits and stress reduction  3.  Demonstrate ability to: Increase healthy adjustment to current life circumstances  INTERVENTIONS: Interventions utilized:  Supportive Counseling was utilized by the clinician during today's follow up session. Clinician processed with the patient regarding how she has been doing since the last follow up session. Clinician encouraged patient to continue to use her coping skills  and practice self care.The clinician congratulated the patient for her progress and for utilizing her coping skills to deal with her current life circumstances. Clinician recommended extending session frequency to every eight weeks since the patient has made such good progress. Clinician measured the patients anxiety and depression on a numerical scale. Standardized Assessments completed: GAD-7 and PHQ 9  GAD-7  4 PHQ-9  9  ASSESSMENT: Patient currently experiencing see above   Patient may benefit from see above  PLAN: 1. Follow up with behavioral health clinician on : 09/02/2019 at 2:00 PM  2. Behavioral recommendations:  3. Referral(s): Integrated [facility] (In Clinic) 4. "From scale of 1-10, how likely are you to follow plan?":   Judith Part, Student-Social Work

## 2020-07-21 ENCOUNTER — Other Ambulatory Visit: Payer: Self-pay | Admitting: Gerontology

## 2020-07-21 ENCOUNTER — Other Ambulatory Visit: Payer: Self-pay

## 2020-07-21 DIAGNOSIS — Z794 Long term (current) use of insulin: Secondary | ICD-10-CM

## 2020-07-21 DIAGNOSIS — E1140 Type 2 diabetes mellitus with diabetic neuropathy, unspecified: Secondary | ICD-10-CM

## 2020-07-21 DIAGNOSIS — I10 Essential (primary) hypertension: Secondary | ICD-10-CM

## 2020-07-21 DIAGNOSIS — K219 Gastro-esophageal reflux disease without esophagitis: Secondary | ICD-10-CM

## 2020-07-30 ENCOUNTER — Other Ambulatory Visit: Payer: Self-pay | Admitting: Gerontology

## 2020-07-30 DIAGNOSIS — E1140 Type 2 diabetes mellitus with diabetic neuropathy, unspecified: Secondary | ICD-10-CM

## 2020-07-30 DIAGNOSIS — Z794 Long term (current) use of insulin: Secondary | ICD-10-CM

## 2020-07-30 DIAGNOSIS — F3341 Major depressive disorder, recurrent, in partial remission: Secondary | ICD-10-CM

## 2020-07-30 MED ORDER — TRULICITY 3 MG/0.5ML ~~LOC~~ SOAJ: 3.0000 mg | SUBCUTANEOUS | 3 refills | Status: AC

## 2020-07-30 MED ORDER — TRULICITY 3 MG/0.5ML ~~LOC~~ SOAJ: 3.0000 mg | SUBCUTANEOUS | 2 refills | Status: DC

## 2020-08-04 ENCOUNTER — Other Ambulatory Visit: Payer: Self-pay

## 2020-08-04 ENCOUNTER — Other Ambulatory Visit: Payer: Self-pay | Admitting: Gerontology

## 2020-08-04 DIAGNOSIS — F3341 Major depressive disorder, recurrent, in partial remission: Secondary | ICD-10-CM

## 2020-08-04 MED ORDER — DULOXETINE HCL 30 MG PO CPEP: 90.0000 mg | ORAL_CAPSULE | Freq: Every day | ORAL | 1 refills | Status: DC

## 2020-08-05 ENCOUNTER — Other Ambulatory Visit: Payer: Self-pay

## 2020-08-05 DIAGNOSIS — E1140 Type 2 diabetes mellitus with diabetic neuropathy, unspecified: Secondary | ICD-10-CM

## 2020-08-05 DIAGNOSIS — Z794 Long term (current) use of insulin: Secondary | ICD-10-CM

## 2020-08-06 LAB — HEMOGLOBIN A1C
Est. average glucose Bld gHb Est-mCnc: 166 mg/dL
Hgb A1c MFr Bld: 7.4 % — ABNORMAL HIGH (ref 4.8–5.6)

## 2020-08-11 ENCOUNTER — Other Ambulatory Visit: Payer: Self-pay | Admitting: Gerontology

## 2020-08-11 ENCOUNTER — Encounter: Payer: Self-pay | Admitting: Gerontology

## 2020-08-11 ENCOUNTER — Ambulatory Visit: Payer: Self-pay | Admitting: Gerontology

## 2020-08-11 ENCOUNTER — Other Ambulatory Visit: Payer: Self-pay

## 2020-08-11 VITALS — BP 129/81 | HR 85 | Resp 16 | Wt 173.2 lb

## 2020-08-11 DIAGNOSIS — Z794 Long term (current) use of insulin: Secondary | ICD-10-CM

## 2020-08-11 DIAGNOSIS — E7800 Pure hypercholesterolemia, unspecified: Secondary | ICD-10-CM

## 2020-08-11 DIAGNOSIS — Z889 Allergy status to unspecified drugs, medicaments and biological substances status: Secondary | ICD-10-CM

## 2020-08-11 DIAGNOSIS — I10 Essential (primary) hypertension: Secondary | ICD-10-CM

## 2020-08-11 DIAGNOSIS — K219 Gastro-esophageal reflux disease without esophagitis: Secondary | ICD-10-CM

## 2020-08-11 MED ORDER — LISINOPRIL 10 MG PO TABS: 10.0000 mg | ORAL_TABLET | Freq: Every day | ORAL | 0 refills | Status: DC

## 2020-08-11 MED ORDER — NITROGLYCERIN 0.4 MG SL SUBL: SUBLINGUAL_TABLET | SUBLINGUAL | 0 refills | Status: DC

## 2020-08-11 MED ORDER — ROSUVASTATIN CALCIUM 10 MG PO TABS: ORAL_TABLET | ORAL | 2 refills | Status: DC

## 2020-08-11 MED ORDER — METOPROLOL TARTRATE 50 MG PO TABS: ORAL_TABLET | ORAL | 1 refills | Status: DC

## 2020-08-11 MED ORDER — OMEPRAZOLE 20 MG PO CPDR: DELAYED_RELEASE_CAPSULE | ORAL | 0 refills | Status: DC

## 2020-08-11 MED ORDER — CANAGLIFLOZIN 100 MG PO TABS
ORAL_TABLET | ORAL | 0 refills | Status: DC
Start: 2020-08-11 — End: 2020-11-10

## 2020-08-11 MED ORDER — MONTELUKAST SODIUM 10 MG PO TABS: 10.0000 mg | ORAL_TABLET | Freq: Every day | ORAL | 0 refills | Status: DC

## 2020-08-11 NOTE — Patient Instructions (Signed)
Carbohydrate Counting for Diabetes Mellitus, Adult  Carbohydrate counting is a method of keeping track of how many carbohydrates you eat. Eating carbohydrates naturally increases the amount of sugar (glucose) in the blood. Counting how many carbohydrates you eat helps keep your blood glucose within normal limits, which helps you manage your diabetes (diabetes mellitus). It is important to know how many carbohydrates you can safely have in each meal. This is different for every person. A diet and nutrition specialist (registered dietitian) can help you make a meal plan and calculate how many carbohydrates you should have at each meal and snack. Carbohydrates are found in the following foods:  Grains, such as breads and cereals.  Dried beans and soy products.  Starchy vegetables, such as potatoes, peas, and corn.  Fruit and fruit juices.  Milk and yogurt.  Sweets and snack foods, such as cake, cookies, candy, chips, and soft drinks. How do I count carbohydrates? There are two ways to count carbohydrates in food. You can use either of the methods or a combination of both. Reading "Nutrition Facts" on packaged food The "Nutrition Facts" list is included on the labels of almost all packaged foods and beverages in the [place] It includes:  The serving size.  Information about nutrients in each serving, including the grams (g) of carbohydrate per serving. To use the "Nutrition Facts":  Decide how many servings you will have.  Multiply the number of servings by the number of carbohydrates per serving.  The resulting number is the total amount of carbohydrates that you will be having. Learning standard serving sizes of other foods When you eat carbohydrate foods that are not packaged or do not include "Nutrition Facts" on the label, you need to measure the servings in order to count the amount of carbohydrates:  Measure the foods that you will eat with a food scale or measuring cup, if  needed.  Decide how many standard-size servings you will eat.  Multiply the number of servings by 15. Most carbohydrate-rich foods have about 15 g of carbohydrates per serving. ? For example, if you eat 8 oz (170 g) of strawberries, you will have eaten 2 servings and 30 g of carbohydrates (2 servings x 15 g = 30 g).  For foods that have more than one food mixed, such as soups and casseroles, you must count the carbohydrates in each food that is included. The following list contains standard serving sizes of common carbohydrate-rich foods. Each of these servings has about 15 g of carbohydrates:   hamburger bun or  English muffin.   oz (15 mL) syrup.   oz (14 g) jelly.  1 slice of bread.  1 six-inch tortilla.  3 oz (85 g) cooked rice or pasta.  4 oz (113 g) cooked dried beans.  4 oz (113 g) starchy vegetable, such as peas, corn, or potatoes.  4 oz (113 g) hot cereal.  4 oz (113 g) mashed potatoes or  of a large baked potato.  4 oz (113 g) canned or frozen fruit.  4 oz (120 mL) fruit juice.  4-6 crackers.  6 chicken nuggets.  6 oz (170 g) unsweetened dry cereal.  6 oz (170 g) plain fat-free yogurt or yogurt sweetened with artificial sweeteners.  8 oz (240 mL) milk.  8 oz (170 g) fresh fruit or one small piece of fruit.  24 oz (680 g) popped popcorn. Example of carbohydrate counting Sample meal  3 oz (85 g) chicken breast.  6 oz (170 g)   brown rice.  4 oz (113 g) corn.  8 oz (240 mL) milk.  8 oz (170 g) strawberries with sugar-free whipped topping. Carbohydrate calculation 1. Identify the foods that contain carbohydrates: ? Rice. ? Corn. ? Milk. ? Strawberries. 2. Calculate how many servings you have of each food: ? 2 servings rice. ? 1 serving corn. ? 1 serving milk. ? 1 serving strawberries. 3. Multiply each number of servings by 15 g: ? 2 servings rice x 15 g = 30 g. ? 1 serving corn x 15 g = 15 g. ? 1 serving milk x 15 g = 15 g. ? 1  serving strawberries x 15 g = 15 g. 4. Add together all of the amounts to find the total grams of carbohydrates eaten: ? 30 g + 15 g + 15 g + 15 g = 75 g of carbohydrates total. Summary  Carbohydrate counting is a method of keeping track of how many carbohydrates you eat.  Eating carbohydrates naturally increases the amount of sugar (glucose) in the blood.  Counting how many carbohydrates you eat helps keep your blood glucose within normal limits, which helps you manage your diabetes.  A diet and nutrition specialist (registered dietitian) can help you make a meal plan and calculate how many carbohydrates you should have at each meal and snack. This information is not intended to replace advice given to you by your health care provider. Make sure you discuss any questions you have with your health care provider. Document Revised: 03/09/2017 Document Reviewed: 01/27/2016 Elsevier Patient Education  2020 [employer]  

## 2020-08-11 NOTE — Progress Notes (Signed)
Established Patient Office Visit  Subjective:  Patient ID: Renee Bush, female    DOB: 1961-07-16  Age: 59 y.o. MRN: [id number]  CC: No chief complaint on file.   HPI Renee Bush presents for follow up of Type 2 diabetes, medication refill and Vitamin B 12 injection. Her HgbA1c done on 08/05/2020 decreased from 7.7% to 7.4%. She reports that she is compliant with her medications and denies any side effects. She states that she checks her blood glucose every day and it was 171 mg/dl this morning. She denies hypo/hyperglycemic symptoms and peripheral neuropathy, performs dialy foot checks. She was seen by [facility] and her Trulicity was increased to 3 mg and Insulin Lantus was decreased to 15 units. Overall, she states that she's doing well and offers no further complaint.  Past Medical History:  Diagnosis Date   Chronic kidney disease, stage III (moderate) (HCC) 10/06/2017   Diabetes mellitus without complication (HCC)    GERD (gastroesophageal reflux disease)    Hypercholesteremia    Hypertension 09/10/2015    Past Surgical History:  Procedure Laterality Date   APPENDECTOMY     CESAREAN SECTION  1991   COLONOSCOPY WITH PROPOFOL N/A 06/03/2019   Procedure: COLONOSCOPY WITH PROPOFOL;  Surgeon: Wyline Mood, MD;  Location: [facility] ENDOSCOPY;  Service: Gastroenterology;  Laterality: N/A;    Family History  Problem Relation Age of Onset   COPD Mother    Alzheimer's disease Mother    Hypertension Father    Hyperlipidemia Father    Heart attack Father    Diabetes type II Father    Diabetes type II Sister    Diabetes type II Sister    Gestational diabetes Daughter    Diabetes type II Sister     Social History   Socioeconomic History   Marital status: Married    Spouse name: Not on file   Number of children: 4   Years of education: Not on file   Highest education level: 11th grade  Occupational History   Occupation: na  Tobacco  Use   Smoking status: Former Smoker    Types: Cigarettes    Quit date: 04/17/2005    Years since quitting: 15.3   Smokeless tobacco: Never Used  Building services engineer Use: Never used  Substance and Sexual Activity   Alcohol use: No    Alcohol/week: 14.0 standard drinks    Types: 14 Cans of beer per week    Comment: quit ~79mo ago   Drug use: No   Sexual activity: Not on file  Other Topics Concern   Not on file  Social History Narrative   Lives in mobile home paid for   Social Determinants of Health   Financial Resource Strain: Low Risk    Difficulty of Paying Living Expenses: Not hard at all  Food Insecurity: No Food Insecurity   Worried About Programme researcher, broadcasting/film/video in the Last Year: Never true   Ran Out of Food in the Last Year: Never true  Transportation Needs: No Transportation Needs   Lack of Transportation (Medical): No   Lack of Transportation (Non-Medical): No  Physical Activity: Insufficiently Active   Days of Exercise per Week: 7 days   Minutes of Exercise per Session: 10 min  Stress: Stress Concern Present   Feeling of Stress : To some extent  Social Connections: Moderately Isolated   Frequency of Communication with Friends and Family: More than three times a week   Frequency of Social  Gatherings with Friends and Family: More than three times a week   Attends Religious Services: Never   Database administrator or Organizations: No   Attends Engineer, structural: Never   Marital Status: Married  Catering manager Violence: Not At Risk   Fear of Current or Ex-Partner: No   Emotionally Abused: No   Physically Abused: No   Sexually Abused: No    Outpatient Medications Prior to Visit  Medication Sig Dispense Refill   aspirin EC 81 MG tablet Take 1 tablet (81 mg total) by mouth daily.     cetirizine (ZYRTEC) 10 MG tablet TAKE ONE TABLET BY MOUTH EVERY DAY 90 tablet 0   cyanocobalamin (,VITAMIN B-12,) 1000 MCG/ML injection Inject 1  mL (1,000 mcg total) into the muscle every 30 (thirty) days. 1 mL 12   Dulaglutide (TRULICITY) 3 MG/0.5ML SOPN Inject 3 mg into the skin once a week. 2 mL 3   DULoxetine (CYMBALTA) 30 MG capsule Take 3 capsules (90 mg total) by mouth daily. 90 capsule 1   ferrous sulfate 325 (65 FE) MG tablet TAKE ONE TABLET BY MOUTH EVERY DAY WITH BREAKFAST. 30 tablet 2   fluticasone (FLONASE) 50 MCG/ACT nasal spray Place 1 spray into both nostrils daily. (Patient taking differently: Place 1 spray into both nostrils daily as needed.) 1 g 1   gabapentin (NEURONTIN) 400 MG capsule Take 1 capsule twice a day and 2 capsules at bedtime 120 capsule 1   Insulin Glargine (BASAGLAR KWIKPEN) 100 UNIT/ML Inject 15 Units into the skin at bedtime. 3 mL 4   metFORMIN (GLUCOPHAGE) 500 MG tablet TAKE ONE TABLET BY MOUTH 2 TIMES A DAY 180 tablet 0   Omega-3 Fatty Acids (FISH OIL) 1000 MG CAPS Take 1,000 mg by mouth 2 (two) times daily.     sodium chloride (OCEAN) 0.65 % SOLN nasal spray Place 1 spray into both nostrils as needed for congestion. 15 mL 0   canagliflozin (INVOKANA) 100 MG TABS tablet Take 1 tablet (100 mg total) by mouth daily before breakfast. 90 tablet 0   lisinopril (ZESTRIL) 10 MG tablet Take 1 tablet (10 mg total) by mouth daily. 90 tablet 0   metoprolol tartrate (LOPRESSOR) 50 MG tablet TAKE 1 1/2 TABLETS (75mg ) 2 TIMES A DAY 90 tablet 0   montelukast (SINGULAIR) 10 MG tablet Take 1 tablet (10 mg total) by mouth at bedtime. 90 tablet 0   nitroGLYCERIN (NITROSTAT) 0.4 MG SL tablet 1 TABLET UNDER TONGUE AS NEEDED FOR CHEST PAIN EVERY 5 MINUTES FOR MAX OF 3 DOSES IN 15 MINUTES; IF NO RELIEF AFTER 1ST DOSE CALL 911 25 tablet 0   omeprazole (PRILOSEC) 20 MG capsule TAKE ONE CAPSULE BYMOUTH 2 TIMES ADAY BEFORE A MEAL 60 capsule 0   rosuvastatin (CRESTOR) 10 MG tablet TAKE 1/2 TABLET (5 mg) BY MOUTH EVERY DAY 30 tablet 2   No facility-administered medications prior to visit.    Allergies  Allergen  Reactions   Etodolac Rash   Penicillin G Rash   Penicillins Rash    Has patient had a PCN reaction causing immediate rash, facial/tongue/throat swelling, SOB or lightheadedness with hypotension: Yes Has patient had a PCN reaction causing severe rash involving mucus membranes or skin necrosis: No Has patient had a PCN reaction that required hospitalization: No Has patient had a PCN reaction occurring within the last 10 years: No If all of the above answers are "NO", then may proceed with Cephalosporin use.    ROS Review  of Systems  Constitutional: Negative.   Eyes: Negative.   Respiratory: Negative.   Cardiovascular: Negative.   Endocrine: Negative.   Skin: Negative.   Neurological: Negative.   Psychiatric/Behavioral: Negative.       Objective:    Physical Exam HENT:     Head: Normocephalic and atraumatic.  Eyes:     Extraocular Movements: Extraocular movements intact.     Conjunctiva/sclera: Conjunctivae normal.     Pupils: Pupils are equal, round, and reactive to light.  Cardiovascular:     Rate and Rhythm: Normal rate and regular rhythm.     Pulses: Normal pulses.     Heart sounds: Normal heart sounds.  Pulmonary:     Effort: Pulmonary effort is normal.     Breath sounds: Normal breath sounds.  Skin:    General: Skin is warm and dry.  Neurological:     General: No focal deficit present.     Mental Status: She is alert and oriented to person, place, and time. Mental status is at baseline.  Psychiatric:        Mood and Affect: Mood normal.        Behavior: Behavior normal.        Thought Content: Thought content normal.        Judgment: Judgment normal.     BP 129/81 (BP Location: Left Arm, Patient Position: Sitting, Cuff Size: Normal)    Pulse 85    Resp 16    Wt 173 lb 3.2 oz (78.6 kg)    SpO2 99%    BMI 31.68 kg/m  Wt Readings from Last 3 Encounters:  08/11/20 173 lb 3.2 oz (78.6 kg)  07/14/20 177 lb (80.3 kg)  05/20/20 180 lb (81.6 kg)   She lost 4  pounds in 1 month, she was encouraged to continue on her weight loss regimen.  Health Maintenance Due  Topic Date Due   Hepatitis C Screening  Never done   HIV Screening  Never done   COVID-19 Vaccine (3 - Booster for Pfizer series) 06/23/2020    There are no preventive care reminders to display for this patient.  Lab Results  Component Value Date   TSH 0.763 11/09/2017   Lab Results  Component Value Date   WBC 7.8 12/18/2019   HGB 13.8 12/18/2019   HCT 40.6 12/18/2019   MCV 92 12/18/2019   PLT 209 12/18/2019   Lab Results  Component Value Date   NA 138 05/13/2020   K 4.1 05/13/2020   CO2 25 05/13/2020   GLUCOSE 131 (H) 05/13/2020   BUN 20 05/13/2020   CREATININE 1.00 05/13/2020   BILITOT 0.3 12/18/2019   ALKPHOS 64 12/18/2019   AST 10 12/18/2019   ALT 10 12/18/2019   PROT 6.2 12/18/2019   ALBUMIN 4.2 12/18/2019   CALCIUM 9.7 05/13/2020   ANIONGAP 12 08/10/2019   Lab Results  Component Value Date   CHOL 125 08/28/2019   Lab Results  Component Value Date   HDL 42 08/28/2019   Lab Results  Component Value Date   LDLCALC 61 08/28/2019   Lab Results  Component Value Date   TRIG 125 08/28/2019   Lab Results  Component Value Date   CHOLHDL 3.0 08/28/2019   Lab Results  Component Value Date   HGBA1C 7.4 (H) 08/05/2020      Assessment & Plan:    1. Hypertension, unspecified type -Her blood pressure is under control, she will continue on current medication, and DASH diet. -  lisinopril (ZESTRIL) 10 MG tablet; Take 1 tablet (10 mg total) by mouth daily.  Dispense: 90 tablet; Refill: 0 - metoprolol tartrate (LOPRESSOR) 50 MG tablet; Take 75 mg ( 1 1/2 tablets twice daily  Dispense: 90 tablet; Refill: 1 - nitroGLYCERIN (NITROSTAT) 0.4 MG SL tablet; 1 TABLET UNDER TONGUE AS NEEDED FOR CHEST PAIN EVERY 5 MINUTES FOR MAX OF 3 DOSES IN 15 MINUTES; IF NO RELIEF AFTER 1ST DOSE CALL 911  Dispense: 25 tablet; Refill: 0  2. Gastroesophageal reflux disease  without esophagitis - Her acid reflux is under control, she will continue on current treatment regimen. -Avoid spicy, fatty and fried food -Avoid sodas and sour juices -Avoid heavy meals -Avoid eating 4 hours before bedtime -Elevate head of bed at night - omeprazole (PRILOSEC) 20 MG capsule; TAKE ONE CAPSULE BYMOUTH 2 TIMES ADAY BEFORE A MEAL  Dispense: 60 capsule; Refill: 0  3. Hypercholesteremia - She will continue on current medication, low fat/cholesterol diet and exercise as tolerated. - rosuvastatin (CRESTOR) 10 MG tablet; TAKE 1/2 TABLET (5 mg) BY MOUTH EVERY DAY  Dispense: 30 tablet; Refill: 2  4. Type 2 diabetes mellitus with diabetic neuropathy, with long-term current use of insulin (HCC) - Her Diabetes is improving, HgbA1c was 7.4%, her goal should be less than 7%. She was advised to continue checking her blood glucose bid, her fasting reading should be between 80-130 mg/dl. Continue on Low carb /non concentrated sweet diet and exercise as tolerated. - canagliflozin (INVOKANA) 100 MG TABS tablet; Take 1 tablet (100 mg total) by mouth daily before breakfast.  Dispense: 90 tablet; Refill: 0 - HgB A1c; Future  5. History of allergy - Her allergy is under control, she will continue on current treatment regimen. - montelukast (SINGULAIR) 10 MG tablet; Take 1 tablet (10 mg total) by mouth at bedtime.  Dispense: 90 tablet; Refill: 0    Follow-up: Return in about 13 weeks (around 11/10/2020), or if symptoms worsen or fail to improve.    Parissa Chiao Trellis PaganiniE Alayne Estrella, NP

## 2020-08-12 ENCOUNTER — Telehealth: Payer: Self-pay | Admitting: Pharmacist

## 2020-08-12 NOTE — Telephone Encounter (Signed)
08/12/2020 9:30:07 AM - Trulicity refill for Dose change faxed to [facility]  -- [name] - Wednesday, August 12, 2020 9:29 AM --Arneta Cliche Trulicity script for Dose Change Inject 3mg  under the skin once a week #4 boxes.

## 2020-08-16 NOTE — Progress Notes (Signed)
I, Selena Lesser, MD, have reviewed all documentation for this visit. The documentation on 08/16/20 for the exam, diagnosis, procedures, and orders are all accurate and complete.I agree with the assessment and plan as documented in the students's note. Patient was advised of potential benefits/risks/SE of therapy and reasons to contact us before next visit.

## 2020-09-01 ENCOUNTER — Ambulatory Visit: Payer: Self-pay | Admitting: Licensed Clinical Social Worker

## 2020-09-01 ENCOUNTER — Telehealth: Payer: Self-pay | Admitting: Licensed Clinical Social Worker

## 2020-09-01 NOTE — Telephone Encounter (Signed)
lmom to inform patient of resch appt to 1/12 at 9 am due to provider out of office

## 2020-09-03 ENCOUNTER — Other Ambulatory Visit: Payer: Self-pay | Admitting: Gerontology

## 2020-09-03 DIAGNOSIS — E1140 Type 2 diabetes mellitus with diabetic neuropathy, unspecified: Secondary | ICD-10-CM

## 2020-09-03 DIAGNOSIS — Z794 Long term (current) use of insulin: Secondary | ICD-10-CM

## 2020-09-08 ENCOUNTER — Other Ambulatory Visit: Payer: Self-pay | Admitting: Gerontology

## 2020-09-08 DIAGNOSIS — E1140 Type 2 diabetes mellitus with diabetic neuropathy, unspecified: Secondary | ICD-10-CM

## 2020-09-08 DIAGNOSIS — Z794 Long term (current) use of insulin: Secondary | ICD-10-CM

## 2020-09-09 ENCOUNTER — Ambulatory Visit: Payer: Self-pay | Admitting: Licensed Clinical Social Worker

## 2020-09-09 ENCOUNTER — Other Ambulatory Visit: Payer: Self-pay

## 2020-09-09 DIAGNOSIS — F3341 Major depressive disorder, recurrent, in partial remission: Secondary | ICD-10-CM

## 2020-09-09 DIAGNOSIS — F411 Generalized anxiety disorder: Secondary | ICD-10-CM
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
## 2020-09-10 ENCOUNTER — Other Ambulatory Visit: Payer: Self-pay

## 2020-09-10 NOTE — Progress Notes (Signed)
Established Patient Office Visit  Subjective:  Patient ID: Renee Bush, female    DOB: 1961-02-27  Age: 60 y.o. MRN: [id number]  CC: No chief complaint on file.   HPI Renee Bush presents for vitamin B12 injection, was administered to left deltoid.  Past Medical History:  Diagnosis Date  . Chronic kidney disease, stage III (moderate) (HCC) 10/06/2017  . Diabetes mellitus without complication (HCC)   . GERD (gastroesophageal reflux disease)   . Hypercholesteremia   . Hypertension 09/10/2015    Past Surgical History:  Procedure Laterality Date  . APPENDECTOMY    . CESAREAN SECTION  1991  . COLONOSCOPY WITH PROPOFOL N/A 06/03/2019   Procedure: COLONOSCOPY WITH PROPOFOL;  Surgeon: Wyline Mood, MD;  Location: [facility] ENDOSCOPY;  Service: Gastroenterology;  Laterality: N/A;    Family History  Problem Relation Age of Onset  . COPD Mother   . Alzheimer's disease Mother   . Hypertension Father   . Hyperlipidemia Father   . Heart attack Father   . Diabetes type II Father   . Diabetes type II Sister   . Diabetes type II Sister   . Gestational diabetes Daughter   . Diabetes type II Sister     Social History   Socioeconomic History  . Marital status: Married    Spouse name: Not on file  . Number of children: 4  . Years of education: Not on file  . Highest education level: 11th grade  Occupational History  . Occupation: na  Tobacco Use  . Smoking status: Former Smoker    Types: Cigarettes    Quit date: 04/17/2005    Years since quitting: 15.4  . Smokeless tobacco: Never Used  Vaping Use  . Vaping Use: Never used  Substance and Sexual Activity  . Alcohol use: No    Alcohol/week: 14.0 standard drinks    Types: 14 Cans of beer per week    Comment: quit ~80mo ago  . Drug use: No  . Sexual activity: Not on file  Other Topics Concern  . Not on file  Social History Narrative   Lives in mobile home paid for   Social Determinants of Health   Financial  Resource Strain: Low Risk   . Difficulty of Paying Living Expenses: Not hard at all  Food Insecurity: No Food Insecurity  . Worried About Programme researcher, broadcasting/film/video in the Last Year: Never true  . Ran Out of Food in the Last Year: Never true  Transportation Needs: No Transportation Needs  . Lack of Transportation (Medical): No  . Lack of Transportation (Non-Medical): No  Physical Activity: Insufficiently Active  . Days of Exercise per Week: 7 days  . Minutes of Exercise per Session: 10 min  Stress: Stress Concern Present  . Feeling of Stress : To some extent  Social Connections: Moderately Isolated  . Frequency of Communication with Friends and Family: More than three times a week  . Frequency of Social Gatherings with Friends and Family: More than three times a week  . Attends Religious Services: Never  . Active Member of Clubs or Organizations: No  . Attends Banker Meetings: Never  . Marital Status: Married  Catering manager Violence: Not At Risk  . Fear of Current or Ex-Partner: No  . Emotionally Abused: No  . Physically Abused: No  . Sexually Abused: No    Outpatient Medications Prior to Visit  Medication Sig Dispense Refill  . aspirin EC 81 MG tablet Take 1 tablet (  81 mg total) by mouth daily.    . canagliflozin (INVOKANA) 100 MG TABS tablet Take 1 tablet (100 mg total) by mouth daily before breakfast. 90 tablet 0  . cetirizine (ZYRTEC) 10 MG tablet TAKE ONE TABLET BY MOUTH EVERY DAY 90 tablet 0  . cyanocobalamin (,VITAMIN B-12,) 1000 MCG/ML injection Inject 1 mL (1,000 mcg total) into the muscle every 30 (thirty) days. 1 mL 12  . Dulaglutide (TRULICITY) 3 MG/0.5ML SOPN Inject 3 mg into the skin once a week. 2 mL 3  . DULoxetine (CYMBALTA) 30 MG capsule Take 3 capsules (90 mg total) by mouth daily. 90 capsule 1  . FEROSUL 325 (65 Fe) MG tablet TAKE ONE TABLET BY MOUTH EVERY DAY WITH BREAKFAST. 90 tablet 0  . fluticasone (FLONASE) 50 MCG/ACT nasal spray Place 1 spray  into both nostrils daily. (Patient taking differently: Place 1 spray into both nostrils daily as needed.) 1 g 1  . gabapentin (NEURONTIN) 400 MG capsule TAKE ONE CAPSULE BY MOUTH 2 TIMES A DAY AND TAKE 2 CAPSULES BY MOUTH AT BEDTIME 120 capsule 0  . Insulin Glargine (BASAGLAR KWIKPEN) 100 UNIT/ML Inject 15 Units into the skin at bedtime. 3 mL 4  . lisinopril (ZESTRIL) 10 MG tablet Take 1 tablet (10 mg total) by mouth daily. 90 tablet 0  . metFORMIN (GLUCOPHAGE) 500 MG tablet TAKE ONE TABLET BY MOUTH 2 TIMES A DAY 180 tablet 0  . metoprolol tartrate (LOPRESSOR) 50 MG tablet Take 75 mg ( 1 1/2 tablets twice daily 90 tablet 1  . montelukast (SINGULAIR) 10 MG tablet Take 1 tablet (10 mg total) by mouth at bedtime. 90 tablet 0  . nitroGLYCERIN (NITROSTAT) 0.4 MG SL tablet 1 TABLET UNDER TONGUE AS NEEDED FOR CHEST PAIN EVERY 5 MINUTES FOR MAX OF 3 DOSES IN 15 MINUTES; IF NO RELIEF AFTER 1ST DOSE CALL 911 25 tablet 0  . Omega-3 Fatty Acids (FISH OIL) 1000 MG CAPS Take 1,000 mg by mouth 2 (two) times daily.    Marland Kitchen omeprazole (PRILOSEC) 20 MG capsule TAKE ONE CAPSULE BYMOUTH 2 TIMES ADAY BEFORE A MEAL 60 capsule 0  . rosuvastatin (CRESTOR) 10 MG tablet TAKE 1/2 TABLET (5 mg) BY MOUTH EVERY DAY 30 tablet 2  . sodium chloride (OCEAN) 0.65 % SOLN nasal spray Place 1 spray into both nostrils as needed for congestion. 15 mL 0   No facility-administered medications prior to visit.    Allergies  Allergen Reactions  . Etodolac Rash  . Penicillin G Rash  . Penicillins Rash    Has patient had a PCN reaction causing immediate rash, facial/tongue/throat swelling, SOB or lightheadedness with hypotension: Yes Has patient had a PCN reaction causing severe rash involving mucus membranes or skin necrosis: No Has patient had a PCN reaction that required hospitalization: No Has patient had a PCN reaction occurring within the last 10 years: No If all of the above answers are "NO", then may proceed with Cephalosporin use.     ROS Review of Systems    Objective:    Physical Exam  There were no vitals taken for this visit. Wt Readings from Last 3 Encounters:  08/11/20 173 lb 3.2 oz (78.6 kg)  07/14/20 177 lb (80.3 kg)  05/20/20 180 lb (81.6 kg)     Health Maintenance Due  Topic Date Due  . Hepatitis C Screening  Never done  . HIV Screening  Never done  . COVID-19 Vaccine (3 - Booster for Pfizer series) 06/23/2020    There  are no preventive care reminders to display for this patient.  Lab Results  Component Value Date   TSH 0.763 11/09/2017   Lab Results  Component Value Date   WBC 7.8 12/18/2019   HGB 13.8 12/18/2019   HCT 40.6 12/18/2019   MCV 92 12/18/2019   PLT 209 12/18/2019   Lab Results  Component Value Date   NA 138 05/13/2020   K 4.1 05/13/2020   CO2 25 05/13/2020   GLUCOSE 131 (H) 05/13/2020   BUN 20 05/13/2020   CREATININE 1.00 05/13/2020   BILITOT 0.3 12/18/2019   ALKPHOS 64 12/18/2019   AST 10 12/18/2019   ALT 10 12/18/2019   PROT 6.2 12/18/2019   ALBUMIN 4.2 12/18/2019   CALCIUM 9.7 05/13/2020   ANIONGAP 12 08/10/2019   Lab Results  Component Value Date   CHOL 125 08/28/2019   Lab Results  Component Value Date   HDL 42 08/28/2019   Lab Results  Component Value Date   LDLCALC 61 08/28/2019   Lab Results  Component Value Date   TRIG 125 08/28/2019   Lab Results  Component Value Date   CHOLHDL 3.0 08/28/2019   Lab Results  Component Value Date   HGBA1C 7.4 (H) 08/05/2020      Assessment & Plan:   Problem List Items Addressed This Visit   None     No orders of the defined types were placed in this encounter.   Follow-up: No follow-ups on file.    Kaelem Brach Trellis Paganini, NP

## 2020-09-23 ENCOUNTER — Other Ambulatory Visit: Payer: Self-pay | Admitting: Gerontology

## 2020-09-24 ENCOUNTER — Telehealth: Payer: Self-pay | Admitting: Pharmacist

## 2020-09-24 NOTE — Telephone Encounter (Signed)
09/24/2020 9:56:53 AM - Cymbalta pending  -- [name] - Thursday, September 24, 2020 9:55 AM --I have received the signed provider portion of [employer] application for Cymbalta--holding for patient to return her portion with POI/Support, mailed to patient 09/18/2020.

## 2020-10-14 ENCOUNTER — Other Ambulatory Visit: Payer: Self-pay

## 2020-10-14 ENCOUNTER — Other Ambulatory Visit: Payer: Self-pay | Admitting: Gerontology

## 2020-10-14 ENCOUNTER — Ambulatory Visit: Payer: Self-pay | Admitting: Licensed Clinical Social Worker

## 2020-10-14 DIAGNOSIS — F3341 Major depressive disorder, recurrent, in partial remission: Secondary | ICD-10-CM

## 2020-10-14 DIAGNOSIS — Z794 Long term (current) use of insulin: Secondary | ICD-10-CM

## 2020-10-14 DIAGNOSIS — F411 Generalized anxiety disorder: Secondary | ICD-10-CM

## 2020-10-14 MED ORDER — METFORMIN HCL 500 MG PO TABS: ORAL_TABLET | ORAL | 0 refills | Status: DC

## 2020-10-14 NOTE — BH Specialist Note (Signed)
Integrated Behavioral Health Follow Up In-Person Visit  MRN: [id number] Name: VITA CURRIN  Total time: 30 minutes  Types of Service: General Behavioral Integrated Care (BHI)  Interpretor:No. Interpretor Name and Language:   Patient consents to telephone visit and 2 patient identifiers were used to identify patient  Subjective: SUZIE VANDAM is a 60 y.o. female accompanied by herself Patient was referred by Hurman Horn, NP  for mental health. Patient reports the following symptoms/concerns: The patient stated that she had COVID even though she had all shots. She reported that it took her a two weeks to recover. She stated that her son had also had COVID but got a lot more sick than she did. She stated that he is doing better now. The patient reports that she is having a hard time falling asleep and has tried 5 mg of Melatonin but it has not helped. She notes that when she goes to bed she just lays there for hours then sleeps all day. She stated that once she is able to fall asleep she only wakes up a couple of times to go to the bathroom. The patient expressed that she would like to try a medication to help her sleep. She dicussed the weather and the recent ice and snow events. She stated that she is looking forward to Spring. The patient denied any suicidal or homicidal thoughts.  Duration of problem: ; Severity of problem: moderate  Objective: Mood: Euthymic and Affect: Appropriate Risk of harm to self or others: No plan to harm self or others  Life Context: Family and Social: see above School/Work: see above Self-Care: see above Life Changes: see above  Patient and/or Family's Strengths/Protective Factors: Concrete supports in place (healthy food, safe environments, etc.)  Goals Addressed: Patient will: 1.  Reduce symptoms of: anxiety, depression and stress  2.  Increase knowledge and/or ability of: coping skills, healthy habits and self-management skills  3.   Demonstrate ability to: Increase healthy adjustment to current life circumstances  Progress towards Goals: Ongoing  Interventions: Interventions utilized:  Supportive Counseling was utilized by the clinician during today's follow up session. The clinician processed with the patient how they have been doing since the last follow-up session. The clinician provided a space for the patient to ventilate their frustrations regarding their current life circumstances. Clinician measured the patient's anxiety and depression on a numerical scale. The clinician encouraged the patient to utilize their coping skills and practice self care to deal with their current life circumstances. Clinician explained that she would relay the patient's medication request at the next case consultation meeting with Dr. Mare Ferrari, MD psychiatric consult and Hurman Horn, NP on 10/20/2020 at 9:00 AM and call her next week.  Standardized Assessments completed: GAD-7 and PHQ 9  GAD-7     6 PHQ-9   13  Assessment: Patient currently experiencing see above  Patient may benefit from see above  Plan: 1. Follow up with behavioral health clinician on : 11/03/2020 at 3:00 PM  2. Behavioral recommendations:  3. Referral(s): Integrated [employer] (In Clinic) 4. "From scale of 1-10, how likely are you to follow plan?":   Judith Part, Student-Social Work

## 2020-10-14 NOTE — Progress Notes (Unsigned)
Established Patient Office Visit  Subjective:  Patient ID: Renee Bush, female    DOB: 03-09-1961  Age: 60 y.o. MRN: [id number]  CC: No chief complaint on file.   HPI Renee Bush presents for Vitamin B12 injection. Lot [id number] and exp date July 2022. Injection was administered to right deltoid, patient tolerated well.  Past Medical History:  Diagnosis Date  . Chronic kidney disease, stage III (moderate) (HCC) 10/06/2017  . Diabetes mellitus without complication (HCC)   . GERD (gastroesophageal reflux disease)   . Hypercholesteremia   . Hypertension 09/10/2015    Past Surgical History:  Procedure Laterality Date  . APPENDECTOMY    . CESAREAN SECTION  1991  . COLONOSCOPY WITH PROPOFOL N/A 06/03/2019   Procedure: COLONOSCOPY WITH PROPOFOL;  Surgeon: Wyline Mood, MD;  Location: [facility] ENDOSCOPY;  Service: Gastroenterology;  Laterality: N/A;    Family History  Problem Relation Age of Onset  . COPD Mother   . Alzheimer's disease Mother   . Hypertension Father   . Hyperlipidemia Father   . Heart attack Father   . Diabetes type II Father   . Diabetes type II Sister   . Diabetes type II Sister   . Gestational diabetes Daughter   . Diabetes type II Sister     Social History   Socioeconomic History  . Marital status: Married    Spouse name: Not on file  . Number of children: 4  . Years of education: Not on file  . Highest education level: 11th grade  Occupational History  . Occupation: na  Tobacco Use  . Smoking status: Former Smoker    Types: Cigarettes    Quit date: 04/17/2005    Years since quitting: 15.5  . Smokeless tobacco: Never Used  Vaping Use  . Vaping Use: Never used  Substance and Sexual Activity  . Alcohol use: No    Alcohol/week: 14.0 standard drinks    Types: 14 Cans of beer per week    Comment: quit ~75mo ago  . Drug use: No  . Sexual activity: Not on file  Other Topics Concern  . Not on file  Social History Narrative   Lives in  mobile home paid for   Social Determinants of Health   Financial Resource Strain: Low Risk   . Difficulty of Paying Living Expenses: Not hard at all  Food Insecurity: No Food Insecurity  . Worried About Programme researcher, broadcasting/film/video in the Last Year: Never true  . Ran Out of Food in the Last Year: Never true  Transportation Needs: No Transportation Needs  . Lack of Transportation (Medical): No  . Lack of Transportation (Non-Medical): No  Physical Activity: Insufficiently Active  . Days of Exercise per Week: 7 days  . Minutes of Exercise per Session: 10 min  Stress: Stress Concern Present  . Feeling of Stress : To some extent  Social Connections: Moderately Isolated  . Frequency of Communication with Friends and Family: More than three times a week  . Frequency of Social Gatherings with Friends and Family: More than three times a week  . Attends Religious Services: Never  . Active Member of Clubs or Organizations: No  . Attends Banker Meetings: Never  . Marital Status: Married  Catering manager Violence: Not At Risk  . Fear of Current or Ex-Partner: No  . Emotionally Abused: No  . Physically Abused: No  . Sexually Abused: No    Outpatient Medications Prior to Visit  Medication Sig Dispense  Refill  . aspirin EC 81 MG tablet Take 1 tablet (81 mg total) by mouth daily.    . canagliflozin (INVOKANA) 100 MG TABS tablet Take 1 tablet (100 mg total) by mouth daily before breakfast. 90 tablet 0  . cetirizine (ZYRTEC) 10 MG tablet TAKE ONE TABLET BY MOUTH EVERY DAY 90 tablet 0  . cyanocobalamin (,VITAMIN B-12,) 1000 MCG/ML injection Inject 1 mL (1,000 mcg total) into the muscle every 30 (thirty) days. 1 mL 12  . Dulaglutide (TRULICITY) 3 MG/0.5ML SOPN Inject 3 mg into the skin once a week. 2 mL 3  . DULoxetine (CYMBALTA) 30 MG capsule Take 3 capsules (90 mg total) by mouth daily. 90 capsule 1  . FEROSUL 325 (65 Fe) MG tablet TAKE ONE TABLET BY MOUTH EVERY DAY WITH BREAKFAST. 90  tablet 0  . fluticasone (FLONASE) 50 MCG/ACT nasal spray Place 1 spray into both nostrils daily. (Patient taking differently: Place 1 spray into both nostrils daily as needed.) 1 g 1  . gabapentin (NEURONTIN) 400 MG capsule TAKE ONE CAPSULE BY MOUTH 2 TIMES A DAY AND TAKE 2 CAPSULES BY MOUTH AT BEDTIME 120 capsule 0  . Insulin Glargine (BASAGLAR KWIKPEN) 100 UNIT/ML Inject 15 Units into the skin at bedtime. 3 mL 4  . lisinopril (ZESTRIL) 10 MG tablet Take 1 tablet (10 mg total) by mouth daily. 90 tablet 0  . metoprolol tartrate (LOPRESSOR) 50 MG tablet Take 75 mg ( 1 1/2 tablets twice daily 90 tablet 1  . montelukast (SINGULAIR) 10 MG tablet Take 1 tablet (10 mg total) by mouth at bedtime. 90 tablet 0  . nitroGLYCERIN (NITROSTAT) 0.4 MG SL tablet 1 TABLET UNDER TONGUE AS NEEDED FOR CHEST PAIN EVERY 5 MINUTES FOR MAX OF 3 DOSES IN 15 MINUTES; IF NO RELIEF AFTER 1ST DOSE CALL 911 25 tablet 0  . Omega-3 Fatty Acids (FISH OIL) 1000 MG CAPS Take 1,000 mg by mouth 2 (two) times daily.    Marland Kitchen omeprazole (PRILOSEC) 20 MG capsule TAKE ONE CAPSULE BYMOUTH 2 TIMES ADAY BEFORE A MEAL 60 capsule 0  . rosuvastatin (CRESTOR) 10 MG tablet TAKE 1/2 TABLET (5 mg) BY MOUTH EVERY DAY 30 tablet 2  . sodium chloride (OCEAN) 0.65 % SOLN nasal spray Place 1 spray into both nostrils as needed for congestion. 15 mL 0  . metFORMIN (GLUCOPHAGE) 500 MG tablet TAKE ONE TABLET BY MOUTH 2 TIMES A DAY 180 tablet 0   No facility-administered medications prior to visit.    Allergies  Allergen Reactions  . Etodolac Rash  . Penicillin G Rash  . Penicillins Rash    Has patient had a PCN reaction causing immediate rash, facial/tongue/throat swelling, SOB or lightheadedness with hypotension: Yes Has patient had a PCN reaction causing severe rash involving mucus membranes or skin necrosis: No Has patient had a PCN reaction that required hospitalization: No Has patient had a PCN reaction occurring within the last 10 years: No If all  of the above answers are "NO", then may proceed with Cephalosporin use.    ROS Review of Systems    Objective:    Physical Exam  BP 115/77   Pulse 88   Temp 97.7 F (36.5 C)   Ht 5\' 2"  (1.575 m)   Wt 167 lb (75.8 kg)   SpO2 98%   BMI 30.54 kg/m  Wt Readings from Last 3 Encounters:  10/14/20 167 lb (75.8 kg)  08/11/20 173 lb 3.2 oz (78.6 kg)  07/14/20 177 lb (80.3 kg)  Health Maintenance Due  Topic Date Due  . Hepatitis C Screening  Never done  . HIV Screening  Never done  . COVID-19 Vaccine (3 - Booster for Pfizer series) 06/23/2020    There are no preventive care reminders to display for this patient.  Lab Results  Component Value Date   TSH 0.763 11/09/2017   Lab Results  Component Value Date   WBC 7.8 12/18/2019   HGB 13.8 12/18/2019   HCT 40.6 12/18/2019   MCV 92 12/18/2019   PLT 209 12/18/2019   Lab Results  Component Value Date   NA 138 05/13/2020   K 4.1 05/13/2020   CO2 25 05/13/2020   GLUCOSE 131 (H) 05/13/2020   BUN 20 05/13/2020   CREATININE 1.00 05/13/2020   BILITOT 0.3 12/18/2019   ALKPHOS 64 12/18/2019   AST 10 12/18/2019   ALT 10 12/18/2019   PROT 6.2 12/18/2019   ALBUMIN 4.2 12/18/2019   CALCIUM 9.7 05/13/2020   ANIONGAP 12 08/10/2019   Lab Results  Component Value Date   CHOL 125 08/28/2019   Lab Results  Component Value Date   HDL 42 08/28/2019   Lab Results  Component Value Date   LDLCALC 61 08/28/2019   Lab Results  Component Value Date   TRIG 125 08/28/2019   Lab Results  Component Value Date   CHOLHDL 3.0 08/28/2019   Lab Results  Component Value Date   HGBA1C 7.4 (H) 08/05/2020      Assessment & Plan:       Follow-up: Return if symptoms worsen or fail to improve.    Tomica Arseneault Trellis Paganini, NP

## 2020-10-16 ENCOUNTER — Other Ambulatory Visit: Payer: Self-pay | Admitting: Gerontology

## 2020-10-16 DIAGNOSIS — I10 Essential (primary) hypertension: Secondary | ICD-10-CM

## 2020-10-17 ENCOUNTER — Other Ambulatory Visit: Payer: Self-pay | Admitting: Gerontology

## 2020-10-21 ENCOUNTER — Telehealth: Payer: Self-pay | Admitting: Licensed Clinical Social Worker

## 2020-10-21 ENCOUNTER — Telehealth: Payer: Self-pay | Admitting: Pharmacist

## 2020-10-21 NOTE — Telephone Encounter (Signed)
Spoke to the patient regarding the case consultation recommendations are for her to start Trazodone 50 MG at bedtime. I let her know it would be called into to Medication Management.

## 2020-10-21 NOTE — Telephone Encounter (Signed)
10/21/2020 3:32:13 PM - Julious Oka renewal faxed for Cymbalta  -- [name] - Wednesday, October 21, 2020 3:31 PM -- Daylene Posey renewal for Cymbalta 30mg  Take 3 capsules by mouth every day.

## 2020-10-22 ENCOUNTER — Other Ambulatory Visit: Payer: Self-pay | Admitting: Gerontology

## 2020-10-22 DIAGNOSIS — F3341 Major depressive disorder, recurrent, in partial remission: Secondary | ICD-10-CM

## 2020-10-22 MED ORDER — TRAZODONE HCL 50 MG PO TABS: 50.0000 mg | ORAL_TABLET | Freq: Every day | ORAL | 0 refills | Status: DC

## 2020-11-03 ENCOUNTER — Ambulatory Visit: Payer: Self-pay | Admitting: Licensed Clinical Social Worker

## 2020-11-03 ENCOUNTER — Other Ambulatory Visit: Payer: Self-pay

## 2020-11-03 DIAGNOSIS — F411 Generalized anxiety disorder: Secondary | ICD-10-CM

## 2020-11-03 DIAGNOSIS — F3341 Major depressive disorder, recurrent, in partial remission: Secondary | ICD-10-CM

## 2020-11-03 NOTE — BH Specialist Note (Signed)
Integrated Behavioral Health Follow Up In-Person Visit  MRN: [id number] Name: Renee Bush   Total time: 60 minutes  Types of Service: General Behavioral Integrated Care (BHI)  Interpretor:No. Interpretor Name and Language: mental health  Patient consents to telephone visit and 2 patient identifiers were used to identify patient  Subjective: Renee Bush is a 60 y.o. female accompanied by herself Patient was referred by Hurman Horn, NP  for mental health Patient reports the following symptoms/concerns: The patient reports that she took Trazodone 50 MG and reports that it worked the first night and she slept well. However, she stated that it stopped working after that. She notes that she increased her dose to 100 MG and it still did not help her sleep. She stated that she took the medication thirty minutes before bed, and it did not make her sleepy. The patient discussed  that she has been doing very well overall. She noted she is enjoying spending time with her family and helps care for her grandchildren on occasion. The patient denied any suicidal or homicidal thoughts.  Duration of problem: ; Severity of problem: mild  Objective: Mood: Euthymic and Affect: Appropriate Risk of harm to self or others: No plan to harm self or others  Life Context: Family and Social: see above School/Work: see above Self-Care: see above Life Changes: see above  Patient and/or Family's Strengths/Protective Factors: Concrete supports in place (healthy food, safe environments, etc.)  Goals Addressed: Patient will: 1.  Reduce symptoms of: anxiety and depression  2.  Increase knowledge and/or ability of: coping skills, healthy habits and self-management skills  3.  Demonstrate ability to: Increase healthy adjustment to current life circumstances  Progress towards Goals: Ongoing  Interventions: Interventions utilized:  Supportive Counseling was utilized by the clinician during  today's follow up session. The clinician processed with the patient how they have been doing since the last follow-up session. The clinician provided a space for the patient to ventilate their frustrations regarding their current life circumstances. Clinician measured the patient's anxiety and depression on a numerical scale. 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 Further the clinician explained that their may be a gap in appointment times during the transition period in May- June 2022. Clinician discussed sleep hygiene with the patient and offered to speak to Dr. Mare Ferrari, MD Psychiatric Consultant on 11/10/2020 regarding her continued difficulty sleeping.  Standardized Assessments completed: GAD-7 and PHQ 9  GAD-7     9 PHQ-9   15   Assessment: Patient currently experiencing see above  Patient may benefit from see above  Plan: 1. Follow up with behavioral health clinician on : 12/02/2020 at 3:00 PM  2. Behavioral recommendations: 3. Referral(s): Integrated [employer] (In Clinic) 4. "From scale of 1-10, how likely are you to follow plan?":   Judith Part, Student-Social Work

## 2020-11-04 ENCOUNTER — Other Ambulatory Visit: Payer: Self-pay

## 2020-11-04 VITALS — BP 106/75 | HR 76 | Temp 97.1°F | Ht 62.0 in | Wt 166.9 lb

## 2020-11-04 DIAGNOSIS — F411 Generalized anxiety disorder: Secondary | ICD-10-CM

## 2020-11-05 ENCOUNTER — Telehealth: Payer: Self-pay | Admitting: Pharmacist

## 2020-11-05 LAB — HEMOGLOBIN A1C
Est. average glucose Bld gHb Est-mCnc: 157 mg/dL
Hgb A1c MFr Bld: 7.1 % — ABNORMAL HIGH (ref 4.8–5.6)

## 2020-11-05 NOTE — Telephone Encounter (Signed)
Provided 2022 proof of income. Approved to receive medication assistance at [facility] until time for re-certification in 5364, and as long as eligibility criteria continues to be met.   [place]

## 2020-11-10 ENCOUNTER — Encounter: Payer: Self-pay | Admitting: Gerontology

## 2020-11-10 ENCOUNTER — Other Ambulatory Visit: Payer: Self-pay | Admitting: Gerontology

## 2020-11-10 ENCOUNTER — Other Ambulatory Visit: Payer: Self-pay

## 2020-11-10 ENCOUNTER — Ambulatory Visit: Payer: Self-pay | Admitting: Gerontology

## 2020-11-10 VITALS — BP 103/75 | HR 93 | Resp 16 | Wt 167.7 lb

## 2020-11-10 DIAGNOSIS — I10 Essential (primary) hypertension: Secondary | ICD-10-CM

## 2020-11-10 DIAGNOSIS — E538 Deficiency of other specified B group vitamins: Secondary | ICD-10-CM

## 2020-11-10 DIAGNOSIS — E1140 Type 2 diabetes mellitus with diabetic neuropathy, unspecified: Secondary | ICD-10-CM

## 2020-11-10 DIAGNOSIS — K219 Gastro-esophageal reflux disease without esophagitis: Secondary | ICD-10-CM

## 2020-11-10 DIAGNOSIS — E7800 Pure hypercholesterolemia, unspecified: Secondary | ICD-10-CM

## 2020-11-10 DIAGNOSIS — Z889 Allergy status to unspecified drugs, medicaments and biological substances status: Secondary | ICD-10-CM

## 2020-11-10 LAB — GLUCOSE, POCT (MANUAL RESULT ENTRY): POC Glucose: 193 mg/dl — AB (ref 70–99)

## 2020-11-10 MED ORDER — OMEPRAZOLE 20 MG PO CPDR
DELAYED_RELEASE_CAPSULE | ORAL | 3 refills | Status: DC
Start: 2020-11-10 — End: 2020-11-10

## 2020-11-10 MED ORDER — LISINOPRIL 10 MG PO TABS: 10.0000 mg | ORAL_TABLET | Freq: Every day | ORAL | 0 refills | Status: DC

## 2020-11-10 MED ORDER — MONTELUKAST SODIUM 10 MG PO TABS
10.0000 mg | ORAL_TABLET | Freq: Every day | ORAL | 0 refills | Status: DC
Start: 2020-11-10 — End: 2020-11-10

## 2020-11-10 MED ORDER — BASAGLAR KWIKPEN 100 UNIT/ML ~~LOC~~ SOPN: 15.0000 [IU] | PEN_INJECTOR | Freq: Every day | SUBCUTANEOUS | 4 refills | Status: DC

## 2020-11-10 MED ORDER — ROSUVASTATIN CALCIUM 10 MG PO TABS: ORAL_TABLET | ORAL | 2 refills | Status: DC

## 2020-11-10 MED ORDER — CYANOCOBALAMIN 1000 MCG/ML IJ SOLN: 1000.0000 ug | INTRAMUSCULAR | 12 refills | Status: DC

## 2020-11-10 MED ORDER — GABAPENTIN 400 MG PO CAPS: ORAL_CAPSULE | ORAL | 0 refills | Status: DC

## 2020-11-10 MED ORDER — CANAGLIFLOZIN 100 MG PO TABS: ORAL_TABLET | ORAL | 0 refills | Status: DC

## 2020-11-10 NOTE — Progress Notes (Signed)
Established Patient Office Visit  Subjective:  Patient ID: Renee Bush, female    DOB: June 04, 1961  Age: 60 y.o. MRN: [id number]  CC: No chief complaint on file.   HPI Renee Bush  60 y/o female who has a history of CKD stage 3, Type 2 DM, Gerd, Hypercholesterolemia and Hypertension, presents for routine visit, lab review and medication refill. Her HgbA1c done on 11/04/20 decreased from 7.4 % to 7.1%. She states that she's compliant with her medication and continues to adhere to ADA diet and exercise as tolerated. She doesn't check her blood glucose as she should, but she denies hypoglycemia/hyperglycemic symptoms and peripheral neuropathy is improved with taking gabapentin. Her blood glucose was checked during visit and it was 193 mg/dl. Overall, she states that she's doing well and offers no further complaint.  Past Medical History:  Diagnosis Date   Chronic kidney disease, stage III (moderate) (HCC) 10/06/2017   Diabetes mellitus without complication (HCC)    GERD (gastroesophageal reflux disease)    Hypercholesteremia    Hypertension 09/10/2015    Past Surgical History:  Procedure Laterality Date   APPENDECTOMY     CESAREAN SECTION  1991   COLONOSCOPY WITH PROPOFOL N/A 06/03/2019   Procedure: COLONOSCOPY WITH PROPOFOL;  Surgeon: Wyline Mood, MD;  Location: [facility] ENDOSCOPY;  Service: Gastroenterology;  Laterality: N/A;    Family History  Problem Relation Age of Onset   COPD Mother    Alzheimer's disease Mother    Hypertension Father    Hyperlipidemia Father    Heart attack Father    Diabetes type II Father    Diabetes type II Sister    Diabetes type II Sister    Gestational diabetes Daughter    Diabetes type II Sister     Social History   Socioeconomic History   Marital status: Married    Spouse name: Not on file   Number of children: 4   Years of education: Not on file   Highest education level: 11th grade  Occupational History    Occupation: na  Tobacco Use   Smoking status: Former Smoker    Types: Cigarettes    Quit date: 04/17/2005    Years since quitting: 15.5   Smokeless tobacco: Never Used  Building services engineer Use: Never used  Substance and Sexual Activity   Alcohol use: No    Alcohol/week: 14.0 standard drinks    Types: 14 Cans of beer per week    Comment: quit ~61mo ago   Drug use: No   Sexual activity: Not on file  Other Topics Concern   Not on file  Social History Narrative   Lives in mobile home paid for   Social Determinants of Health   Financial Resource Strain: Low Risk    Difficulty of Paying Living Expenses: Not hard at all  Food Insecurity: No Food Insecurity   Worried About Programme researcher, broadcasting/film/video in the Last Year: Never true   Ran Out of Food in the Last Year: Never true  Transportation Needs: No Transportation Needs   Lack of Transportation (Medical): No   Lack of Transportation (Non-Medical): No  Physical Activity: Insufficiently Active   Days of Exercise per Week: 7 days   Minutes of Exercise per Session: 10 min  Stress: Stress Concern Present   Feeling of Stress : To some extent  Social Connections: Moderately Isolated   Frequency of Communication with Friends and Family: More than three times a week   Frequency  of Social Gatherings with Friends and Family: More than three times a week   Attends Religious Services: Never   Database administratorActive Member of Clubs or Organizations: No   Attends Engineer, structuralClub or Organization Meetings: Never   Marital Status: Married  Catering managerntimate Partner Violence: Not At Risk   Fear of Current or Ex-Partner: No   Emotionally Abused: No   Physically Abused: No   Sexually Abused: No    Outpatient Medications Prior to Visit  Medication Sig Dispense Refill   aspirin EC 81 MG tablet Take 1 tablet (81 mg total) by mouth daily.     cetirizine (ZYRTEC) 10 MG tablet TAKE ONE TABLET BY MOUTH EVERY DAY 90 tablet 0   FEROSUL 325 (65 Fe) MG tablet TAKE  ONE TABLET BY MOUTH EVERY DAY WITH BREAKFAST. 90 tablet 0   metFORMIN (GLUCOPHAGE) 500 MG tablet TAKE ONE TABLET BY MOUTH 2 TIMES A DAY 180 tablet 0   metoprolol tartrate (LOPRESSOR) 50 MG tablet TAKE 1 1/2 TABLETS (75mg ) 2 TIMES A DAY 90 tablet 0   nitroGLYCERIN (NITROSTAT) 0.4 MG SL tablet 1 TABLET UNDER TONGUE AS NEEDED FOR CHEST PAIN EVERY 5 MINUTES FOR MAX OF 3 DOSES IN 15 MINUTES; IF NO RELIEF AFTER 1ST DOSE CALL 911 25 tablet 0   Omega-3 Fatty Acids (FISH OIL) 1000 MG CAPS Take 1,000 mg by mouth 2 (two) times daily.     sodium chloride (OCEAN) 0.65 % SOLN nasal spray Place 1 spray into both nostrils as needed for congestion. 15 mL 0   traZODone (DESYREL) 50 MG tablet Take 1 tablet (50 mg total) by mouth at bedtime. 30 tablet 0   canagliflozin (INVOKANA) 100 MG TABS tablet Take 1 tablet (100 mg total) by mouth daily before breakfast. 90 tablet 0   cyanocobalamin (,VITAMIN B-12,) 1000 MCG/ML injection Inject 1 mL (1,000 mcg total) into the muscle every 30 (thirty) days. 1 mL 12   gabapentin (NEURONTIN) 400 MG capsule TAKE ONE CAPSULE BY MOUTH 2 TIMES A DAY AND TAKE 2 CAPSULES BY MOUTH AT BEDTIME 120 capsule 0   Insulin Glargine (BASAGLAR KWIKPEN) 100 UNIT/ML Inject 15 Units into the skin at bedtime. 3 mL 4   lisinopril (ZESTRIL) 10 MG tablet Take 1 tablet (10 mg total) by mouth daily. 90 tablet 0   montelukast (SINGULAIR) 10 MG tablet Take 1 tablet (10 mg total) by mouth at bedtime. 90 tablet 0   omeprazole (PRILOSEC) 20 MG capsule TAKE ONE CAPSULE BYMOUTH 2 TIMES ADAY BEFORE A MEAL 60 capsule 0   rosuvastatin (CRESTOR) 10 MG tablet TAKE 1/2 TABLET (5 mg) BY MOUTH EVERY DAY 30 tablet 2   DULoxetine (CYMBALTA) 30 MG capsule Take 3 capsules (90 mg total) by mouth daily. 90 capsule 1   fluticasone (FLONASE) 50 MCG/ACT nasal spray Place 1 spray into both nostrils daily. (Patient not taking: Reported on 11/10/2020) 1 g 1   No facility-administered medications prior to visit.     Allergies  Allergen Reactions   Etodolac Rash   Penicillin G Rash   Penicillins Rash    Has patient had a PCN reaction causing immediate rash, facial/tongue/throat swelling, SOB or lightheadedness with hypotension: Yes Has patient had a PCN reaction causing severe rash involving mucus membranes or skin necrosis: No Has patient had a PCN reaction that required hospitalization: No Has patient had a PCN reaction occurring within the last 10 years: No If all of the above answers are "NO", then may proceed with Cephalosporin use.  ROS Review of Systems  Constitutional: Negative.   Eyes: Negative.   Respiratory: Negative.   Cardiovascular: Negative.   Endocrine: Negative.   Skin: Negative.   Neurological: Negative.   Psychiatric/Behavioral: Negative.       Objective:    Physical Exam HENT:     Head: Normocephalic and atraumatic.  Eyes:     Extraocular Movements: Extraocular movements intact.     Conjunctiva/sclera: Conjunctivae normal.     Pupils: Pupils are equal, round, and reactive to light.  Cardiovascular:     Rate and Rhythm: Normal rate and regular rhythm.     Pulses: Normal pulses.     Heart sounds: Normal heart sounds.  Pulmonary:     Effort: Pulmonary effort is normal.     Breath sounds: Normal breath sounds.  Skin:    General: Skin is warm.  Neurological:     General: No focal deficit present.     Mental Status: She is alert and oriented to person, place, and time. Mental status is at baseline.  Psychiatric:        Mood and Affect: Mood normal.        Behavior: Behavior normal.        Thought Content: Thought content normal.        Judgment: Judgment normal.     BP 103/75 (BP Location: Left Arm, Patient Position: Sitting, Cuff Size: Large)    Pulse 93    Resp 16    Wt 167 lb 11.2 oz (76.1 kg)    SpO2 100%    BMI 30.67 kg/m  Wt Readings from Last 3 Encounters:  11/10/20 167 lb 11.2 oz (76.1 kg)  11/04/20 166 lb 14.4 oz (75.7 kg)  10/14/20 167  lb (75.8 kg)   Encouraged weight loss  Health Maintenance Due  Topic Date Due   Hepatitis C Screening  Never done   HIV Screening  Never done   COVID-19 Vaccine (3 - Booster for Pfizer series) 06/23/2020    There are no preventive care reminders to display for this patient.  Lab Results  Component Value Date   TSH 0.763 11/09/2017   Lab Results  Component Value Date   WBC 7.8 12/18/2019   HGB 13.8 12/18/2019   HCT 40.6 12/18/2019   MCV 92 12/18/2019   PLT 209 12/18/2019   Lab Results  Component Value Date   NA 138 05/13/2020   K 4.1 05/13/2020   CO2 25 05/13/2020   GLUCOSE 131 (H) 05/13/2020   BUN 20 05/13/2020   CREATININE 1.00 05/13/2020   BILITOT 0.3 12/18/2019   ALKPHOS 64 12/18/2019   AST 10 12/18/2019   ALT 10 12/18/2019   PROT 6.2 12/18/2019   ALBUMIN 4.2 12/18/2019   CALCIUM 9.7 05/13/2020   ANIONGAP 12 08/10/2019   Lab Results  Component Value Date   CHOL 125 08/28/2019   Lab Results  Component Value Date   HDL 42 08/28/2019   Lab Results  Component Value Date   LDLCALC 61 08/28/2019   Lab Results  Component Value Date   TRIG 125 08/28/2019   Lab Results  Component Value Date   CHOLHDL 3.0 08/28/2019   Lab Results  Component Value Date   HGBA1C 7.1 (H) 11/04/2020      Assessment & Plan:   1. Type 2 diabetes mellitus with diabetic neuropathy, with long-term current use of insulin (HCC) - Her Diabetes is improving, HgbA1c 7.1%, her goal should be less than 7%. She will continue on  current treatment regimen, check blood glucose daily, fasting reading should be between 80-130 mg/dl. - canagliflozin (INVOKANA) 100 MG TABS tablet; Take 1 tablet (100 mg total) by mouth daily before breakfast.  Dispense: 90 tablet; Refill: 0 - gabapentin (NEURONTIN) 400 MG capsule; TAKE ONE CAPSULE BY MOUTH 2 TIMES A DAY AND TAKE 2 CAPSULES BY MOUTH AT BEDTIME  Dispense: 120 capsule; Refill: 0 - Insulin Glargine (BASAGLAR KWIKPEN) 100 UNIT/ML; Inject 15  Units into the skin at bedtime.  Dispense: 3 mL; Refill: 4 - HgB A1c; Future - POCT Glucose (CBG); Future - POCT Glucose (CBG)  2. Hypertension, unspecified type - Her blood pressure is under control and will continue on current medication and DASH diet. - lisinopril (ZESTRIL) 10 MG tablet; Take 1 tablet (10 mg total) by mouth daily.  Dispense: 90 tablet; Refill: 0  3. History of allergy - She will continue on current medication - montelukast (SINGULAIR) 10 MG tablet; Take 1 tablet (10 mg total) by mouth at bedtime.  Dispense: 90 tablet; Refill: 0  4. Hypercholesteremia - She will continue on current medication, low fat/cholesterol diet and will recheck Lipid panel - rosuvastatin (CRESTOR) 10 MG tablet; TAKE 1/2 TABLET (5 mg) BY MOUTH EVERY DAY  Dispense: 30 tablet; Refill: 2  5. B12 deficiency - She will continue to receive Monthly B12 injections.  6. Gastroesophageal reflux disease without esophagitis - Her acid reflux is under control and she will continue on current medication. - omeprazole (PRILOSEC) 20 MG capsule; TAKE ONE CAPSULE BYMOUTH 2 TIMES ADAY BEFORE A MEAL  Dispense: 60 capsule; Refill: 3     Follow-up: Return in about 13 weeks (around 02/09/2021), or if symptoms worsen or fail to improve.    Niyati Heinke Trellis Paganini, NP

## 2020-11-10 NOTE — Patient Instructions (Signed)

## 2020-11-29 ENCOUNTER — Other Ambulatory Visit: Payer: Self-pay

## 2020-11-30 ENCOUNTER — Other Ambulatory Visit: Payer: Self-pay

## 2020-12-01 ENCOUNTER — Other Ambulatory Visit: Payer: Self-pay

## 2020-12-01 MED ORDER — BASAGLAR KWIKPEN 100 UNIT/ML ~~LOC~~ SOPN: PEN_INJECTOR | SUBCUTANEOUS | 4 refills | Status: DC

## 2020-12-02 ENCOUNTER — Ambulatory Visit: Payer: Self-pay | Admitting: Licensed Clinical Social Worker

## 2020-12-02 ENCOUNTER — Other Ambulatory Visit: Payer: Self-pay

## 2020-12-02 DIAGNOSIS — F3341 Major depressive disorder, recurrent, in partial remission: Secondary | ICD-10-CM

## 2020-12-02 DIAGNOSIS — F411 Generalized anxiety disorder: Secondary | ICD-10-CM

## 2020-12-02 NOTE — BH Specialist Note (Signed)
Integrated Behavioral Health Follow Up In-Person Visit  MRN: [id number] Name: Renee Bush   Total time: 60 minutes  Types of Service: Telephone visit  Patient consents to telephone visit and 2 patient identifiers were used to identify patient  Interpretor:No. Interpretor Name and Language:   Subjective: Renee Bush is a 60 y.o. female accompanied by herself Patient was referred by Hurman Horn NP for mental health. Patient reports the following symptoms/concerns: The patient stated that,"things have been going good, it has." She discussed financial and family stressors in her life, but notes that she is not as agitated anymore. She stated she still is having difficulty falling asleep. She stated that she understands that she can go to [facility] walk in or the emergency department if she has a mental health crisis and needs any help. She discussed the weather and how warm it has been today. She asked questions regarding her medication. The patient denied any suicidal or homicidal thoughts.  Duration of problem: ; Severity of problem: moderate  Objective: Mood: Euthymic and Affect: Appropriate Risk of harm to self or others: No plan to harm self or others  Life Context: Family and Social: see above School/Work: see above Self-Care: see above Life Changes: see above  Patient and/or Family's Strengths/Protective Factors: Social connections and Concrete supports in place (healthy food, safe environments, etc.)  Goals Addressed: Patient will: 1.  Reduce symptoms of: agitation, anxiety, depression and insomnia  2.  Increase knowledge and/or ability of: coping skills, healthy habits and self-management skills  3.  Demonstrate ability to: Increase healthy adjustment to current life circumstances  Progress towards Goals: Ongoing  Interventions: Interventions utilized:  CBT Cognitive Behavioral Therapy was utilized by the clinician during today's follow up session. The  clinician processed with the patient how they have been doing since the last follow-up session. The clinician provided a space for the patient to ventilate their frustrations regarding their current life circumstances. Clinician measured the patient's anxiety and depression on a numerical scale. Clinician encouraged the patient to take their mediation at the same time everyday exactly as prescribed for it to reach it's full intended effect.  Clinician discussed the plan for bridging the patients care during the clinician's upcoming transition from student to LCSW-A in the clinic. Clinician offered to send an e-mail to the patient with an attached [facility] flyer that includes the walk- in hours and the crisis line number. Clinician explained that should the patient experience a crisis to utilize [facility] or go to the closest emergency department for help.  Standardized Assessments completed: GAD-7 and PHQ 9  GAD-7    5 PHQ-9   12   Assessment: Patient currently experiencing see above  Patient may benefit from see above  Plan: 1. Follow up with behavioral health clinician on : Will call to schedule in May 2. Behavioral recommendations:  3. Referral(s): [facility] [employer] (In Clinic) 4. "From scale of 1-10, how likely are you to follow plan?":   Judith Part, Student-Social Work

## 2020-12-07 ENCOUNTER — Other Ambulatory Visit: Payer: Self-pay

## 2020-12-07 ENCOUNTER — Other Ambulatory Visit: Payer: Self-pay | Admitting: Gerontology

## 2020-12-07 DIAGNOSIS — F3341 Major depressive disorder, recurrent, in partial remission: Secondary | ICD-10-CM

## 2020-12-07 DIAGNOSIS — I10 Essential (primary) hypertension: Secondary | ICD-10-CM

## 2020-12-07 MED FILL — Cyanocobalamin Inj 1000 MCG/ML: INTRAMUSCULAR | 30 days supply | Qty: 1 | Fill #0 | Status: AC

## 2020-12-07 MED FILL — Metformin HCl Tab 500 MG: ORAL | 90 days supply | Qty: 180 | Fill #0 | Status: AC

## 2020-12-07 MED FILL — Ferrous Sulfate Tab 325 MG (65 MG Elemental Fe): ORAL | 90 days supply | Qty: 90 | Fill #0 | Status: AC

## 2020-12-07 MED FILL — Lancets: 30 days supply | Qty: 100 | Fill #0 | Status: AC

## 2020-12-07 MED FILL — Glucose Blood Test Strip: 30 days supply | Qty: 100 | Fill #0 | Status: AC

## 2020-12-07 MED FILL — Dulaglutide Soln Auto-injector 3 MG/0.5ML: SUBCUTANEOUS | 30 days supply | Qty: 2 | Fill #0 | Status: AC

## 2020-12-07 MED FILL — Cetirizine HCl Tab 10 MG: ORAL | 90 days supply | Qty: 90 | Fill #0 | Status: AC

## 2020-12-08 MED ORDER — NITROGLYCERIN 0.4 MG SL SUBL
SUBLINGUAL_TABLET | SUBLINGUAL | 0 refills | Status: DC
  Filled 2020-12-08: qty 25, 25d supply, fill #0

## 2020-12-08 MED ORDER — DULOXETINE HCL 30 MG PO CPEP
ORAL_CAPSULE | Freq: Every day | ORAL | 1 refills | Status: DC
  Filled 2020-12-08: qty 90, 30d supply, fill #0
  Filled 2021-01-14: qty 90, 30d supply, fill #1

## 2020-12-08 MED ORDER — TRAZODONE HCL 50 MG PO TABS
ORAL_TABLET | Freq: Every day | ORAL | 0 refills | Status: DC
  Filled 2020-12-08: qty 30, 30d supply, fill #0

## 2020-12-09 ENCOUNTER — Ambulatory Visit: Payer: Self-pay | Admitting: Gerontology

## 2020-12-09 ENCOUNTER — Other Ambulatory Visit: Payer: Self-pay

## 2020-12-09 VITALS — Wt 167.3 lb

## 2020-12-09 DIAGNOSIS — I10 Essential (primary) hypertension: Secondary | ICD-10-CM

## 2020-12-09 MED ORDER — METOPROLOL TARTRATE 50 MG PO TABS
ORAL_TABLET | ORAL | 1 refills | Status: DC
  Filled 2020-12-09: qty 90, 30d supply, fill #0
  Filled 2021-01-13 – 2021-01-15 (×2): qty 90, 30d supply, fill #1

## 2020-12-09 NOTE — Progress Notes (Signed)
Established Patient Office Visit  Subjective:  Patient ID: Renee Bush, female    DOB: 08-19-1961  Age: 60 y.o. MRN: [id number]  CC: No chief complaint on file.   HPI Renee Bush presents for Vitamin B 12 injection. She states that she's compliant with her medications , denies side effects and continues to make healthy lifestyle changes. She received her B12 injection to left deltoid, tolerated well.  Past Medical History:  Diagnosis Date  . Chronic kidney disease, stage III (moderate) ([place]) 10/06/2017  . Diabetes mellitus without complication ([place])   . GERD (gastroesophageal reflux disease)   . Hypercholesteremia   . Hypertension 09/10/2015    Past Surgical History:  Procedure Laterality Date  . APPENDECTOMY    . CESAREAN SECTION  1991  . COLONOSCOPY WITH PROPOFOL N/A 06/03/2019   Procedure: COLONOSCOPY WITH PROPOFOL;  Surgeon: Jonathon Bellows, MD;  Location: [facility] ENDOSCOPY;  Service: Gastroenterology;  Laterality: N/A;    Family History  Problem Relation Age of Onset  . COPD Mother   . Alzheimer's disease Mother   . Hypertension Father   . Hyperlipidemia Father   . Heart attack Father   . Diabetes type II Father   . Diabetes type II Sister   . Diabetes type II Sister   . Gestational diabetes Daughter   . Diabetes type II Sister     Social History   Socioeconomic History  . Marital status: Married    Spouse name: Not on file  . Number of children: 4  . Years of education: Not on file  . Highest education level: 11th grade  Occupational History  . Occupation: na  Tobacco Use  . Smoking status: Former Smoker    Types: Cigarettes    Quit date: 04/17/2005    Years since quitting: 15.6  . Smokeless tobacco: Never Used  Vaping Use  . Vaping Use: Never used  Substance and Sexual Activity  . Alcohol use: No    Alcohol/week: 14.0 standard drinks    Types: 14 Cans of beer per week    Comment: quit ~81mo ago  . Drug use: No  . Sexual activity: Not on  file  Other Topics Concern  . Not on file  Social History Narrative   Lives in mobile home paid for   Social Determinants of Health   Financial Resource Strain: Low Risk   . Difficulty of Paying Living Expenses: Not hard at all  Food Insecurity: No Food Insecurity  . Worried About Charity fundraiser in the Last Year: Never true  . Ran Out of Food in the Last Year: Never true  Transportation Needs: No Transportation Needs  . Lack of Transportation (Medical): No  . Lack of Transportation (Non-Medical): No  Physical Activity: Insufficiently Active  . Days of Exercise per Week: 7 days  . Minutes of Exercise per Session: 10 min  Stress: Stress Concern Present  . Feeling of Stress : To some extent  Social Connections: Moderately Isolated  . Frequency of Communication with Friends and Family: More than three times a week  . Frequency of Social Gatherings with Friends and Family: More than three times a week  . Attends Religious Services: Never  . Active Member of Clubs or Organizations: No  . Attends Archivist Meetings: Never  . Marital Status: Married  Human resources officer Violence: Not At Risk  . Fear of Current or Ex-Partner: No  . Emotionally Abused: No  . Physically Abused: No  . Sexually  Abused: No    Outpatient Medications Prior to Visit  Medication Sig Dispense Refill  . aspirin EC 81 MG tablet Take 1 tablet (81 mg total) by mouth daily.    . Blood Glucose Monitoring Suppl (RIGHTEST GM550 BLOOD GLUCOSE) w/Device KIT AS DIRECTED 1 kit 0  . canagliflozin (INVOKANA) 100 MG TABS tablet TAKE ONE TABLET BY MOUTH EVERY DAY BEFORE BREAKFAST 90 tablet 0  . cetirizine (ZYRTEC) 10 MG tablet TAKE ONE TABLET BY MOUTH EVERY DAY 90 tablet 1  . cyanocobalamin (,VITAMIN B-12,) 1000 MCG/ML injection INJECT 1 ML INTO THE MUSCLE EVERY 30 DAYS 1 mL 12  . Dulaglutide 3 MG/0.5ML SOPN INJECT 3MG INTO THE SKIN ONCE A WEEK AS DIRECTED 16 mL 3  . DULoxetine (CYMBALTA) 30 MG capsule TAKE 3  CAPSULES BY MOUTH EVERY DAY 90 capsule 1  . ferrous sulfate 325 (65 FE) MG tablet TAKE ONE TABLET BY MOUTH EVERY DAY WITH BREAKFAST. 90 tablet 1  . gabapentin (NEURONTIN) 400 MG capsule TAKE ONE CAPSULE BY MOUTH 2 TIMES A DAY AND TAKE 2 CAPSULES BY MOUTH AT BEDTIME 120 capsule 0  . glucose blood test strip AS DIRECTED 100 strip 99  . Insulin Glargine (BASAGLAR KWIKPEN) 100 UNIT/ML INJECT 15 UNITS UNDER THE SKIN AT BEDTIME 5 mL 4  . Insulin Glargine (BASAGLAR KWIKPEN) 100 UNIT/ML INJECT 17 UNITS UNDER THE SKIN AT BEDTIME 5 mL 4  . Insulin Pen Needle 32G X 4 MM MISC AS DIRECTED 50 each 99  . lisinopril (ZESTRIL) 10 MG tablet TAKE ONE TABLET BY MOUTH EVERY DAY 90 tablet 0  . metFORMIN (GLUCOPHAGE) 500 MG tablet TAKE ONE TABLET BY MOUTH 2 TIMES A DAY 180 tablet 0  . montelukast (SINGULAIR) 10 MG tablet TAKE ONE TABLET BY MOUTH AT BEDTIME 90 tablet 0  . nitroGLYCERIN (NITROSTAT) 0.4 MG SL tablet 1 TABLET UNDER TONGUE AS NEEDED FOR CHEST PAIN EVERY 5 MINUTES FOR MAX OF 3 DOSES IN 15 MINUTES;IF NO RELIEF AFTER 1ST DOSE CALL 911 25 tablet 0  . Omega-3 Fatty Acids (FISH OIL) 1000 MG CAPS Take 1,000 mg by mouth 2 (two) times daily.    Marland Kitchen omeprazole (PRILOSEC) 20 MG capsule TAKE ONE CAPSULE BY MOUTH 2 TIMES A DAY BEFORE MEALS 60 capsule 3  . Rightest GL300 Lancets MISC AS DIRECTED 100 each 99  . rosuvastatin (CRESTOR) 10 MG tablet TAKE 1/2 TABLET (5 mg) BY MOUTH EVERY DAY 30 tablet 2  . sodium chloride (OCEAN) 0.65 % SOLN nasal spray Place 1 spray into both nostrils as needed for congestion. 15 mL 0  . traZODone (DESYREL) 50 MG tablet TAKE ONE TABLET BY MOUTH AT BEDTIME 30 tablet 0  . metoprolol tartrate (LOPRESSOR) 50 MG tablet TAKE 1 1/2 TABLETS (75MG) 2 TIMES A DAY 90 tablet 1  . rosuvastatin (CRESTOR) 5 MG tablet TAKE ONE TABLET (5 MG) BY MOUTH EVERY DAY 30 tablet 2   No facility-administered medications prior to visit.    Allergies  Allergen Reactions  . Etodolac Rash  . Penicillin G Rash  .  Penicillins Rash    Has patient had a PCN reaction causing immediate rash, facial/tongue/throat swelling, SOB or lightheadedness with hypotension: Yes Has patient had a PCN reaction causing severe rash involving mucus membranes or skin necrosis: No Has patient had a PCN reaction that required hospitalization: No Has patient had a PCN reaction occurring within the last 10 years: No If all of the above answers are "NO", then may proceed with Cephalosporin use.  ROS Review of Systems  Constitutional: Negative.   Respiratory: Negative.   Cardiovascular: Negative.   Neurological: Negative.   Psychiatric/Behavioral: Negative.       Objective:    Physical Exam Cardiovascular:     Rate and Rhythm: Normal rate and regular rhythm.     Pulses: Normal pulses.     Heart sounds: Normal heart sounds.  Skin:    General: Skin is warm.  Neurological:     General: No focal deficit present.     Mental Status: She is alert. Mental status is at baseline.  Psychiatric:        Mood and Affect: Mood normal.        Behavior: Behavior normal.        Thought Content: Thought content normal.        Judgment: Judgment normal.     Wt 167 lb 4.8 oz (75.9 kg)   BMI 30.60 kg/m  Wt Readings from Last 3 Encounters:  12/09/20 167 lb 4.8 oz (75.9 kg)  11/10/20 167 lb 11.2 oz (76.1 kg)  11/04/20 166 lb 14.4 oz (75.7 kg)     Health Maintenance Due  Topic Date Due  . Hepatitis C Screening  Never done  . HIV Screening  Never done  . COVID-19 Vaccine (3 - Booster for Pfizer series) 06/23/2020    There are no preventive care reminders to display for this patient.  Lab Results  Component Value Date   TSH 0.763 11/09/2017   Lab Results  Component Value Date   WBC 7.8 12/18/2019   HGB 13.8 12/18/2019   HCT 40.6 12/18/2019   MCV 92 12/18/2019   PLT 209 12/18/2019   Lab Results  Component Value Date   NA 138 05/13/2020   K 4.1 05/13/2020   CO2 25 05/13/2020   GLUCOSE 131 (H) 05/13/2020    BUN 20 05/13/2020   CREATININE 1.00 05/13/2020   BILITOT 0.3 12/18/2019   ALKPHOS 64 12/18/2019   AST 10 12/18/2019   ALT 10 12/18/2019   PROT 6.2 12/18/2019   ALBUMIN 4.2 12/18/2019   CALCIUM 9.7 05/13/2020   ANIONGAP 12 08/10/2019   Lab Results  Component Value Date   CHOL 125 08/28/2019   Lab Results  Component Value Date   HDL 42 08/28/2019   Lab Results  Component Value Date   LDLCALC 61 08/28/2019   Lab Results  Component Value Date   TRIG 125 08/28/2019   Lab Results  Component Value Date   CHOLHDL 3.0 08/28/2019   Lab Results  Component Value Date   HGBA1C 7.1 (H) 11/04/2020      Assessment & Plan:   1. Hypertension, unspecified type -Her blood pressure is under control, will continue current medication and DASH diet. - metoprolol tartrate (LOPRESSOR) 50 MG tablet; TAKE 1 1/2 TABLETS (75MG) 2 TIMES A DAY  Dispense: 90 tablet; Refill: 1     Follow-up: Return in about 29 days (around 01/07/2021), or if symptoms worsen or fail to improve.    Edina Winningham Jerold Coombe, NP

## 2020-12-10 ENCOUNTER — Other Ambulatory Visit: Payer: Self-pay

## 2020-12-11 ENCOUNTER — Other Ambulatory Visit: Payer: Self-pay

## 2020-12-11 MED FILL — Dulaglutide Soln Auto-injector 3 MG/0.5ML: SUBCUTANEOUS | 30 days supply | Qty: 2 | Fill #1 | Status: CN

## 2020-12-14 ENCOUNTER — Other Ambulatory Visit: Payer: Self-pay

## 2020-12-14 ENCOUNTER — Other Ambulatory Visit: Payer: Self-pay | Admitting: Gerontology

## 2020-12-14 DIAGNOSIS — E1140 Type 2 diabetes mellitus with diabetic neuropathy, unspecified: Secondary | ICD-10-CM

## 2020-12-14 MED FILL — Insulin Pen Needle 32 G X 4 MM (1/6" or 5/32"): 50 days supply | Qty: 100 | Fill #0 | Status: AC

## 2020-12-14 MED FILL — Montelukast Sodium Tab 10 MG (Base Equiv): ORAL | 90 days supply | Qty: 90 | Fill #0 | Status: AC

## 2020-12-14 MED FILL — Canagliflozin Tab 100 MG: ORAL | 90 days supply | Qty: 90 | Fill #0 | Status: AC

## 2020-12-15 ENCOUNTER — Other Ambulatory Visit: Payer: Self-pay

## 2020-12-15 MED ORDER — GABAPENTIN 400 MG PO CAPS
ORAL_CAPSULE | ORAL | 0 refills | Status: DC
  Filled 2020-12-15: qty 120, 120d supply, fill #0

## 2020-12-16 ENCOUNTER — Other Ambulatory Visit: Payer: Self-pay

## 2020-12-17 ENCOUNTER — Other Ambulatory Visit: Payer: Self-pay

## 2020-12-18 ENCOUNTER — Other Ambulatory Visit: Payer: Self-pay

## 2020-12-28 ENCOUNTER — Other Ambulatory Visit: Payer: Self-pay

## 2020-12-30 ENCOUNTER — Other Ambulatory Visit: Payer: Self-pay | Admitting: Gerontology

## 2020-12-30 ENCOUNTER — Other Ambulatory Visit: Payer: Self-pay

## 2020-12-30 DIAGNOSIS — E1140 Type 2 diabetes mellitus with diabetic neuropathy, unspecified: Secondary | ICD-10-CM

## 2020-12-30 DIAGNOSIS — Z794 Long term (current) use of insulin: Secondary | ICD-10-CM

## 2020-12-30 MED ORDER — DULAGLUTIDE 3 MG/0.5ML ~~LOC~~ SOAJ
SUBCUTANEOUS | 3 refills | Status: DC
  Filled 2020-12-30: qty 8, fill #0
  Filled 2020-12-30: qty 8, 120d supply, fill #0
  Filled 2021-01-13: qty 6, 84d supply, fill #0
  Filled 2021-03-26: qty 6, 84d supply, fill #1
  Filled 2021-04-06: qty 8, 112d supply, fill #1
  Filled 2021-07-04 – 2021-07-31 (×2): qty 8, 112d supply, fill #2
  Filled 2021-09-23 – 2021-09-27 (×2): qty 8, 112d supply, fill #3
  Filled ????-??-??: fill #2
  Filled ????-??-??: fill #0

## 2020-12-30 MED FILL — Cyanocobalamin Inj 1000 MCG/ML: INTRAMUSCULAR | 30 days supply | Qty: 1 | Fill #1 | Status: AC

## 2020-12-31 ENCOUNTER — Other Ambulatory Visit: Payer: Self-pay

## 2021-01-06 ENCOUNTER — Ambulatory Visit: Payer: Self-pay | Admitting: Gerontology

## 2021-01-06 ENCOUNTER — Other Ambulatory Visit: Payer: Self-pay

## 2021-01-06 VITALS — BP 100/70 | HR 86 | Temp 96.3°F | Resp 16 | Ht 62.0 in | Wt 163.2 lb

## 2021-01-06 DIAGNOSIS — E538 Deficiency of other specified B group vitamins: Secondary | ICD-10-CM

## 2021-01-06 NOTE — Progress Notes (Signed)
Patient here today for her monthly Vitamin B12 injections. Injection given right deltoid. Patient tolerated without complaint.

## 2021-01-07 ENCOUNTER — Ambulatory Visit: Payer: Self-pay | Admitting: Gerontology

## 2021-01-08 ENCOUNTER — Other Ambulatory Visit: Payer: Self-pay

## 2021-01-13 ENCOUNTER — Other Ambulatory Visit: Payer: Self-pay | Admitting: Gerontology

## 2021-01-13 DIAGNOSIS — E1140 Type 2 diabetes mellitus with diabetic neuropathy, unspecified: Secondary | ICD-10-CM

## 2021-01-13 DIAGNOSIS — Z794 Long term (current) use of insulin: Secondary | ICD-10-CM

## 2021-01-13 MED ORDER — GABAPENTIN 400 MG PO CAPS
ORAL_CAPSULE | ORAL | 0 refills | Status: DC
  Filled 2021-01-13: qty 120, 30d supply, fill #0

## 2021-01-13 MED ORDER — BASAGLAR KWIKPEN 100 UNIT/ML ~~LOC~~ SOPN
PEN_INJECTOR | SUBCUTANEOUS | 4 refills | Status: DC
  Filled 2021-01-13: qty 15, 88d supply, fill #0
  Filled 2021-04-03: qty 15, 88d supply, fill #1
  Filled 2021-04-21: qty 15, 89d supply, fill #1
  Filled 2021-07-31: qty 15, 89d supply, fill #2
  Filled 2021-11-07: qty 15, 89d supply, fill #3

## 2021-01-14 ENCOUNTER — Other Ambulatory Visit: Payer: Self-pay

## 2021-01-15 ENCOUNTER — Other Ambulatory Visit: Payer: Self-pay

## 2021-02-01 ENCOUNTER — Other Ambulatory Visit: Payer: Self-pay

## 2021-02-01 ENCOUNTER — Other Ambulatory Visit: Payer: Self-pay | Admitting: Gerontology

## 2021-02-01 DIAGNOSIS — F3341 Major depressive disorder, recurrent, in partial remission: Secondary | ICD-10-CM

## 2021-02-02 ENCOUNTER — Other Ambulatory Visit: Payer: Self-pay

## 2021-02-02 MED ORDER — DULOXETINE HCL 30 MG PO CPEP
ORAL_CAPSULE | Freq: Every day | ORAL | 0 refills | Status: DC
  Filled 2021-02-02: qty 90, fill #0
  Filled 2021-02-24: qty 90, 30d supply, fill #0

## 2021-02-03 ENCOUNTER — Other Ambulatory Visit: Payer: Self-pay

## 2021-02-03 DIAGNOSIS — E1140 Type 2 diabetes mellitus with diabetic neuropathy, unspecified: Secondary | ICD-10-CM

## 2021-02-03 MED FILL — Cyanocobalamin Inj 1000 MCG/ML: INTRAMUSCULAR | 30 days supply | Qty: 1 | Fill #2 | Status: AC

## 2021-02-03 MED FILL — Omeprazole Cap Delayed Release 20 MG: ORAL | 30 days supply | Qty: 60 | Fill #0 | Status: AC

## 2021-02-04 ENCOUNTER — Other Ambulatory Visit: Payer: Self-pay

## 2021-02-04 LAB — HEMOGLOBIN A1C
Est. average glucose Bld gHb Est-mCnc: 154 mg/dL
Hgb A1c MFr Bld: 7 % — ABNORMAL HIGH (ref 4.8–5.6)

## 2021-02-09 ENCOUNTER — Ambulatory Visit: Payer: Self-pay | Admitting: Gerontology

## 2021-02-09 ENCOUNTER — Encounter: Payer: Self-pay | Admitting: Gerontology

## 2021-02-09 ENCOUNTER — Other Ambulatory Visit: Payer: Self-pay

## 2021-02-09 VITALS — BP 108/74 | Temp 96.8°F | Ht 62.0 in | Wt 164.7 lb

## 2021-02-09 DIAGNOSIS — Z0000 Encounter for general adult medical examination without abnormal findings: Secondary | ICD-10-CM

## 2021-02-09 DIAGNOSIS — K219 Gastro-esophageal reflux disease without esophagitis: Secondary | ICD-10-CM

## 2021-02-09 DIAGNOSIS — E7800 Pure hypercholesterolemia, unspecified: Secondary | ICD-10-CM

## 2021-02-09 DIAGNOSIS — Z794 Long term (current) use of insulin: Secondary | ICD-10-CM

## 2021-02-09 DIAGNOSIS — Z889 Allergy status to unspecified drugs, medicaments and biological substances status: Secondary | ICD-10-CM

## 2021-02-09 DIAGNOSIS — I10 Essential (primary) hypertension: Secondary | ICD-10-CM

## 2021-02-09 DIAGNOSIS — J0110 Acute frontal sinusitis, unspecified: Secondary | ICD-10-CM

## 2021-02-09 MED ORDER — LISINOPRIL 10 MG PO TABS
ORAL_TABLET | Freq: Every day | ORAL | 0 refills | Status: DC
  Filled 2021-02-09: qty 90, 90d supply, fill #0

## 2021-02-09 MED ORDER — METFORMIN HCL 500 MG PO TABS
ORAL_TABLET | Freq: Two times a day (BID) | ORAL | 0 refills | Status: DC
  Filled 2021-02-09: qty 180, fill #0
  Filled 2021-03-22: qty 180, 90d supply, fill #0

## 2021-02-09 MED ORDER — MONTELUKAST SODIUM 10 MG PO TABS
ORAL_TABLET | Freq: Every day | ORAL | 0 refills | Status: DC
  Filled 2021-02-09: qty 90, fill #0
  Filled 2021-03-15: qty 90, 90d supply, fill #0

## 2021-02-09 MED ORDER — OMEPRAZOLE 20 MG PO CPDR
DELAYED_RELEASE_CAPSULE | ORAL | 3 refills | Status: DC
  Filled 2021-02-09: qty 60, fill #0
  Filled 2021-03-06: qty 60, 30d supply, fill #0
  Filled 2021-04-03: qty 60, 30d supply, fill #1
  Filled 2021-05-05: qty 60, 30d supply, fill #2
  Filled 2021-06-02: qty 60, 30d supply, fill #3

## 2021-02-09 MED ORDER — METOPROLOL TARTRATE 50 MG PO TABS
ORAL_TABLET | ORAL | 1 refills | Status: DC
  Filled 2021-02-09: qty 90, 30d supply, fill #0
  Filled 2021-03-15: qty 90, 30d supply, fill #1

## 2021-02-09 MED ORDER — GABAPENTIN 400 MG PO CAPS
ORAL_CAPSULE | ORAL | 0 refills | Status: DC
  Filled 2021-02-09: qty 120, fill #0
  Filled 2021-02-12: qty 120, 30d supply, fill #0

## 2021-02-09 MED ORDER — CETIRIZINE HCL 10 MG PO TABS
ORAL_TABLET | Freq: Every day | ORAL | 1 refills | Status: DC
  Filled 2021-02-09 – 2021-02-12 (×2): qty 90, fill #0
  Filled 2021-03-06: qty 90, 90d supply, fill #0
  Filled 2021-03-26 – 2021-06-21 (×3): qty 90, 90d supply, fill #1

## 2021-02-09 MED ORDER — CANAGLIFLOZIN 100 MG PO TABS
ORAL_TABLET | ORAL | 0 refills | Status: DC
  Filled 2021-02-09: qty 90, fill #0
  Filled 2021-03-07: qty 90, 90d supply, fill #0

## 2021-02-09 MED ORDER — ROSUVASTATIN CALCIUM 5 MG PO TABS
ORAL_TABLET | ORAL | 2 refills | Status: DC
  Filled 2021-02-09: qty 30, 30d supply, fill #0
  Filled 2021-02-09: qty 15, 30d supply, fill #0
  Filled 2021-03-16: qty 90, 90d supply, fill #1
  Filled 2021-06-28: qty 30, 30d supply, fill #2

## 2021-02-09 NOTE — Progress Notes (Signed)
Established Patient Office Visit  Subjective:  Patient ID: Renee Bush, female    DOB: November 15, 1960  Age: 60 y.o. MRN: [id number]  CC:  Chief Complaint  Patient presents with   Follow-up    Lab follow up    HPI Renee Bush  is a 60 y/o female who has history of T2DM, CKD stage 3, GERD, Hypercholesterolemia, Hypertension, presents for lab review and medication refill. Her HgbA1c done on on 02/03/21 decreased from 7.1% to 7 %. She states that she's compliant with her medications , denies side effects, and checks her fasting blood glucose daily. She states that her readings are usually less than 120 mg/dl. She denies hypo/hyperglycemic symptoms, states that her peripheral neuropathy is improved with taking gabapentin and she performs daily foot checks. She received her monthly Vitamin B12 injections, and she tolerated well. She denies chest pain, palpitation, shortness of breath and vision changes. Overall, she states that she's doing well and offers no further complaint.  Past Medical History:  Diagnosis Date   Chronic kidney disease, stage III (moderate) ([place]) 10/06/2017   Diabetes mellitus without complication (HCC)    GERD (gastroesophageal reflux disease)    Hypercholesteremia    Hypertension 09/10/2015    Past Surgical History:  Procedure Laterality Date   APPENDECTOMY     CESAREAN SECTION  1991   COLONOSCOPY WITH PROPOFOL N/A 06/03/2019   Procedure: COLONOSCOPY WITH PROPOFOL;  Surgeon: Jonathon Bellows, MD;  Location: [facility] ENDOSCOPY;  Service: Gastroenterology;  Laterality: N/A;    Family History  Problem Relation Age of Onset   COPD Mother    Alzheimer's disease Mother    Hypertension Father    Hyperlipidemia Father    Heart attack Father    Diabetes type II Father    Diabetes type II Sister    Diabetes type II Sister    Gestational diabetes Daughter    Diabetes type II Sister     Social History   Socioeconomic History   Marital status: Married    Spouse  name: Not on file   Number of children: 4   Years of education: Not on file   Highest education level: 11th grade  Occupational History   Occupation: na  Tobacco Use   Smoking status: Former    Pack years: 0.00    Types: Cigarettes    Quit date: 04/17/2005    Years since quitting: 15.8   Smokeless tobacco: Never  Vaping Use   Vaping Use: Never used  Substance and Sexual Activity   Alcohol use: No    Alcohol/week: 14.0 standard drinks    Types: 14 Cans of beer per week    Comment: quit ~31moago   Drug use: No   Sexual activity: Not on file  Other Topics Concern   Not on file  Social History Narrative   Lives in mobile home paid for   Social Determinants of Health   Financial Resource Strain: Low Risk    Difficulty of Paying Living Expenses: Not hard at all  Food Insecurity: No Food Insecurity   Worried About RCharity fundraiserin the Last Year: Never true   RHallettin the Last Year: Never true  Transportation Needs: No Transportation Needs   Lack of Transportation (Medical): No   Lack of Transportation (Non-Medical): No  Physical Activity: Insufficiently Active   Days of Exercise per Week: 7 days   Minutes of Exercise per Session: 10 min  Stress: Stress Concern Present  Feeling of Stress : To some extent  Social Connections: Moderately Isolated   Frequency of Communication with Friends and Family: More than three times a week   Frequency of Social Gatherings with Friends and Family: More than three times a week   Attends Religious Services: Never   Marine scientist or Organizations: No   Attends Music therapist: Never   Marital Status: Married  Human resources officer Violence: Not At Risk   Fear of Current or Ex-Partner: No   Emotionally Abused: No   Physically Abused: No   Sexually Abused: No    Outpatient Medications Prior to Visit  Medication Sig Dispense Refill   aspirin EC 81 MG tablet Take 1 tablet (81 mg total) by mouth daily.      Blood Glucose Monitoring Suppl (RIGHTEST GM550 BLOOD GLUCOSE) w/Device KIT AS DIRECTED 1 kit 0   cyanocobalamin (,VITAMIN B-12,) 1000 MCG/ML injection INJECT 1 ML INTO THE MUSCLE EVERY 30 DAYS 1 mL 12   Dulaglutide 3 MG/0.5ML SOPN INJECT 3MG INTO THE SKIN ONCE A WEEK AS DIRECTED 16 mL 3   DULoxetine (CYMBALTA) 30 MG capsule TAKE 3 CAPSULES BY MOUTH EVERY DAY 90 capsule 0   ferrous sulfate 325 (65 FE) MG tablet TAKE ONE TABLET BY MOUTH EVERY DAY WITH BREAKFAST. 90 tablet 1   glucose blood test strip AS DIRECTED 100 strip 99   Insulin Glargine (BASAGLAR KWIKPEN) 100 UNIT/ML INJECT 17 UNITS UNDER THE SKIN AT BEDTIME 15 mL 4   Insulin Pen Needle 32G X 4 MM MISC AS DIRECTED 50 each 99   nitroGLYCERIN (NITROSTAT) 0.4 MG SL tablet 1 TABLET UNDER TONGUE AS NEEDED FOR CHEST PAIN EVERY 5 MINUTES FOR MAX OF 3 DOSES IN 15 MINUTES;IF NO RELIEF AFTER 1ST DOSE CALL 911 25 tablet 0   Omega-3 Fatty Acids (FISH OIL) 1000 MG CAPS Take 1,000 mg by mouth 2 (two) times daily.     Rightest GL300 Lancets MISC AS DIRECTED 100 each 99   canagliflozin (INVOKANA) 100 MG TABS tablet TAKE ONE TABLET BY MOUTH EVERY DAY BEFORE BREAKFAST 90 tablet 0   cetirizine (ZYRTEC) 10 MG tablet TAKE ONE TABLET BY MOUTH EVERY DAY 90 tablet 1   gabapentin (NEURONTIN) 400 MG capsule TAKE ONE CAPSULE BY MOUTH 2 TIMES A DAY AND TAKE 2 CAPSULES BY MOUTH AT BEDTIME 120 capsule 0   lisinopril (ZESTRIL) 10 MG tablet TAKE ONE TABLET BY MOUTH EVERY DAY 90 tablet 0   metFORMIN (GLUCOPHAGE) 500 MG tablet TAKE ONE TABLET BY MOUTH 2 TIMES A DAY 180 tablet 0   metoprolol tartrate (LOPRESSOR) 50 MG tablet TAKE 1 1/2 TABLETS (75MG) 2 TIMES A DAY 90 tablet 1   montelukast (SINGULAIR) 10 MG tablet TAKE ONE TABLET BY MOUTH AT BEDTIME 90 tablet 0   omeprazole (PRILOSEC) 20 MG capsule TAKE ONE CAPSULE BY MOUTH 2 TIMES A DAY BEFORE MEALS 60 capsule 3   rosuvastatin (CRESTOR) 10 MG tablet TAKE 1/2 TABLET (5 mg) BY MOUTH EVERY DAY 30 tablet 2   sodium chloride  (OCEAN) 0.65 % SOLN nasal spray Place 1 spray into both nostrils as needed for congestion. (Patient not taking: Reported on 02/09/2021) 15 mL 0   traZODone (DESYREL) 50 MG tablet TAKE ONE TABLET BY MOUTH AT BEDTIME 30 tablet 0   No facility-administered medications prior to visit.    Allergies  Allergen Reactions   Etodolac Rash   Penicillin G Rash   Penicillins Rash    Has patient had  a PCN reaction causing immediate rash, facial/tongue/throat swelling, SOB or lightheadedness with hypotension: Yes Has patient had a PCN reaction causing severe rash involving mucus membranes or skin necrosis: No Has patient had a PCN reaction that required hospitalization: No Has patient had a PCN reaction occurring within the last 10 years: No If all of the above answers are "NO", then may proceed with Cephalosporin use.    ROS Review of Systems  Constitutional: Negative.   HENT: Negative.    Eyes: Negative.   Respiratory: Negative.    Cardiovascular: Negative.   Endocrine: Negative.   Skin: Negative.   Neurological: Negative.   Psychiatric/Behavioral: Negative.       Objective:    Physical Exam HENT:     Head: Normocephalic and atraumatic.     Mouth/Throat:     Mouth: Mucous membranes are moist.  Eyes:     Extraocular Movements: Extraocular movements intact.     Conjunctiva/sclera: Conjunctivae normal.     Pupils: Pupils are equal, round, and reactive to light.  Cardiovascular:     Rate and Rhythm: Normal rate and regular rhythm.     Pulses: Normal pulses.     Heart sounds: Normal heart sounds.  Pulmonary:     Effort: Pulmonary effort is normal.     Breath sounds: Normal breath sounds.  Skin:    General: Skin is warm.  Neurological:     General: No focal deficit present.     Mental Status: She is alert and oriented to person, place, and time. Mental status is at baseline.  Psychiatric:        Mood and Affect: Mood normal.        Behavior: Behavior normal.        Thought  Content: Thought content normal.        Judgment: Judgment normal.    BP 108/74 (BP Location: Right Arm, Patient Position: Sitting, Cuff Size: Large)   Temp (!) 96.8 F (36 C)   Ht $R'5\' 2"'RQ$  (1.575 m)   Wt 164 lb 11.2 oz (74.7 kg)   SpO2 96%   BMI 30.12 kg/m  Wt Readings from Last 3 Encounters:  02/09/21 164 lb 11.2 oz (74.7 kg)  02/03/21 165 lb 12.8 oz (75.2 kg)  01/06/21 163 lb 3.2 oz (74 kg)   Encouraged weight loss   Health Maintenance Due  Topic Date Due   Pneumococcal Vaccine 28-49 Years old (1 - PCV) Never done   HIV Screening  Never done   Hepatitis C Screening  Never done   Zoster Vaccines- Shingrix (1 of 2) Never done   COVID-19 Vaccine (3 - Pfizer risk series) 01/20/2020    There are no preventive care reminders to display for this patient.  Lab Results  Component Value Date   TSH 0.763 11/09/2017   Lab Results  Component Value Date   WBC 7.8 12/18/2019   HGB 13.8 12/18/2019   HCT 40.6 12/18/2019   MCV 92 12/18/2019   PLT 209 12/18/2019   Lab Results  Component Value Date   NA 138 05/13/2020   K 4.1 05/13/2020   CO2 25 05/13/2020   GLUCOSE 131 (H) 05/13/2020   BUN 20 05/13/2020   CREATININE 1.00 05/13/2020   BILITOT 0.3 12/18/2019   ALKPHOS 64 12/18/2019   AST 10 12/18/2019   ALT 10 12/18/2019   PROT 6.2 12/18/2019   ALBUMIN 4.2 12/18/2019   CALCIUM 9.7 05/13/2020   ANIONGAP 12 08/10/2019   Lab Results  Component Value  Date   CHOL 125 08/28/2019   Lab Results  Component Value Date   HDL 42 08/28/2019   Lab Results  Component Value Date   LDLCALC 61 08/28/2019   Lab Results  Component Value Date   TRIG 125 08/28/2019   Lab Results  Component Value Date   CHOLHDL 3.0 08/28/2019   Lab Results  Component Value Date   HGBA1C 7.0 (H) 02/03/2021      Assessment & Plan:     1. Type 2 diabetes mellitus with diabetic neuropathy, with long-term current use of insulin (HCC) -Her Diabetes is improving, HgbA1c at 7%, she will continue  on current medication, low carb/non concentrated sweet t and exercise as tolerated. - canagliflozin (INVOKANA) 100 MG TABS tablet; TAKE ONE TABLET BY MOUTH EVERY DAY BEFORE BREAKFAST  Dispense: 90 tablet; Refill: 0 - gabapentin (NEURONTIN) 400 MG capsule; TAKE ONE CAPSULE BY MOUTH 2 TIMES A DAY AND TAKE 2 CAPSULES BY MOUTH AT BEDTIME  Dispense: 120 capsule; Refill: 0 - metFORMIN (GLUCOPHAGE) 500 MG tablet; TAKE ONE TABLET BY MOUTH 2 TIMES A DAY  Dispense: 180 tablet; Refill: 0 - HgB A1c; Future  2. Acute frontal sinusitis, recurrence not specified -She will continue on current medication - cetirizine (ZYRTEC) 10 MG tablet; TAKE ONE TABLET BY MOUTH EVERY DAY  Dispense: 90 tablet; Refill: 1  3. Hypertension, unspecified type - Her blood pressure is under control, she will continue on current medication, DASH diet and exercise as tolerated. - lisinopril (ZESTRIL) 10 MG tablet; TAKE ONE TABLET BY MOUTH EVERY DAY  Dispense: 90 tablet; Refill: 0 - metoprolol tartrate (LOPRESSOR) 50 MG tablet; TAKE 1 1/2 TABLETS (75MG) 2 TIMES A DAY  Dispense: 90 tablet; Refill: 1  4. History of allergy -Her allergy is under control, she will continue on current medication - montelukast (SINGULAIR) 10 MG tablet; TAKE ONE TABLET BY MOUTH AT BEDTIME  Dispense: 90 tablet; Refill: 0  5. Gastroesophageal reflux disease without esophagitis -Her acid reflux is under control, she will continue on current medication . -Avoid spicy, fatty and fried food -Avoid sodas and sour juices -Avoid heavy meals -Avoid eating 4 hours before bedtime -Elevate head of bed at night - omeprazole (PRILOSEC) 20 MG capsule; TAKE ONE CAPSULE BY MOUTH 2 TIMES A DAY BEFORE MEALS  Dispense: 60 capsule; Refill: 3  6. Hypercholesteremia -She will continue on current medication, low fat/cholesterol diet and exercise as tolerated - rosuvastatin (CRESTOR) 10 MG tablet; TAKE 1/2 TABLET (5MG TOTAL) BY MOUTH ONCE EVERY DAY.  Dispense: 30 tablet;  Refill: 2  7. Health care maintenance -Routine labs will be checked. - CBC w/Diff; Future - Comp Met (CMET); Future - Lipid panel; Future - B12 and Folate Panel; Future    Follow-up: Return in about 13 weeks (around 05/11/2021), or if symptoms worsen or fail to improve.    Oliana Gowens Jerold Coombe, NP

## 2021-02-09 NOTE — Patient Instructions (Signed)
https://www.diabeteseducator.org/docs/default-source/living-with-diabetes/conquering-the-grocery-store-v1.pdf?sfvrsn=4">  Carbohydrate Counting for Diabetes Mellitus, Adult Carbohydrate counting is a method of keeping track of how many carbohydrates you eat. Eating carbohydrates naturally increases the amount of sugar (glucose) in the blood. Counting how many carbohydrates you eat improves your bloodglucose control, which helps you manage your diabetes. It is important to know how many carbohydrates you can safely have in each meal. This is different for every person. A dietitian can help you make a meal plan and calculate how many carbohydrates you should have at each meal andsnack. What foods contain carbohydrates? Carbohydrates are found in the following foods: Grains, such as breads and cereals. Dried beans and soy products. Starchy vegetables, such as potatoes, peas, and corn. Fruit and fruit juices. Milk and yogurt. Sweets and snack foods, such as cake, cookies, candy, chips, and soft drinks. How do I count carbohydrates in foods? There are two ways to count carbohydrates in food. You can read food labels or learn standard serving sizes of foods. You can use either of the methods or acombination of both. Using the Nutrition Facts label The Nutrition Facts list is included on the labels of almost all packaged foods and beverages in the [place] It includes: The serving size. Information about nutrients in each serving, including the grams (g) of carbohydrate per serving. To use the Nutrition Facts: Decide how many servings you will have. Multiply the number of servings by the number of carbohydrates per serving. The resulting number is the total amount of carbohydrates that you will be having. Learning the standard serving sizes of foods When you eat carbohydrate foods that are not packaged or do not include Nutrition Facts on the label, you need to measure the servings in order to count the  amount of carbohydrates. Measure the foods that you will eat with a food scale or measuring cup, if needed. Decide how many standard-size servings you will eat. Multiply the number of servings by 15. For foods that contain carbohydrates, one serving equals 15 g of carbohydrates. For example, if you eat 2 cups or 10 oz (300 g) of strawberries, you will have eaten 2 servings and 30 g of carbohydrates (2 servings x 15 g = 30 g). For foods that have more than one food mixed, such as soups and casseroles, you must count the carbohydrates in each food that is included. The following list contains standard serving sizes of common carbohydrate-rich foods. Each of these servings has about 15 g of carbohydrates: 1 slice of bread. 1 six-inch (15 cm) tortilla. ? cup or 2 oz (53 g) cooked rice or pasta.  cup or 3 oz (85 g) cooked or canned, drained and rinsed beans or lentils.  cup or 3 oz (85 g) starchy vegetable, such as peas, corn, or squash.  cup or 4 oz (120 g) hot cereal.  cup or 3 oz (85 g) boiled or mashed potatoes, or  or 3 oz (85 g) of a large baked potato.  cup or 4 fl oz (118 mL) fruit juice. 1 cup or 8 fl oz (237 mL) milk. 1 small or 4 oz (106 g) apple.  or 2 oz (63 g) of a medium banana. 1 cup or 5 oz (150 g) strawberries. 3 cups or 1 oz (24 g) popped popcorn. What is an example of carbohydrate counting? To calculate the number of carbohydrates in this sample meal, follow the stepsshown below. Sample meal 3 oz (85 g) chicken breast. ? cup or 4 oz (106 g) brown rice.    cup or 3 oz (85 g) corn. 1 cup or 8 fl oz (237 mL) milk. 1 cup or 5 oz (150 g) strawberries with sugar-free whipped topping. Carbohydrate calculation Identify the foods that contain carbohydrates: Rice. Corn. Milk. Strawberries. Calculate how many servings you have of each food: 2 servings rice. 1 serving corn. 1 serving milk. 1 serving strawberries. Multiply each number of servings by 15 g: 2 servings  rice x 15 g = 30 g. 1 serving corn x 15 g = 15 g. 1 serving milk x 15 g = 15 g. 1 serving strawberries x 15 g = 15 g. Add together all of the amounts to find the total grams of carbohydrates eaten: 30 g + 15 g + 15 g + 15 g = 75 g of carbohydrates total. What are tips for following this plan? Shopping Develop a meal plan and then make a shopping list. Buy fresh and frozen vegetables, fresh and frozen fruit, dairy, eggs, beans, lentils, and whole grains. Look at food labels. Choose foods that have more fiber and less sugar. Avoid processed foods and foods with added sugars. Meal planning Aim to have the same amount of carbohydrates at each meal and for each snack time. Plan to have regular, balanced meals and snacks. Where to find more information American Diabetes Association: www.diabetes.org Centers for Disease Control and Prevention: www.cdc.gov Summary Carbohydrate counting is a method of keeping track of how many carbohydrates you eat. Eating carbohydrates naturally increases the amount of sugar (glucose) in the blood. Counting how many carbohydrates you eat improves your blood glucose control, which helps you manage your diabetes. A dietitian can help you make a meal plan and calculate how many carbohydrates you should have at each meal and snack. This information is not intended to replace advice given to you by your health care provider. Make sure you discuss any questions you have with your healthcare provider. Document Revised: 08/15/2019 Document Reviewed: 08/16/2019 Elsevier Patient Education  2021 Elsevier Inc.  

## 2021-02-12 ENCOUNTER — Other Ambulatory Visit: Payer: Self-pay

## 2021-02-18 ENCOUNTER — Ambulatory Visit: Payer: Self-pay | Admitting: Licensed Clinical Social Worker

## 2021-02-18 ENCOUNTER — Other Ambulatory Visit: Payer: Self-pay

## 2021-02-18 DIAGNOSIS — F3341 Major depressive disorder, recurrent, in partial remission: Secondary | ICD-10-CM

## 2021-02-18 DIAGNOSIS — F411 Generalized anxiety disorder: Secondary | ICD-10-CM
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
## 2021-02-24 ENCOUNTER — Other Ambulatory Visit: Payer: Self-pay

## 2021-02-25 ENCOUNTER — Other Ambulatory Visit: Payer: Self-pay

## 2021-03-05 ENCOUNTER — Other Ambulatory Visit: Payer: Self-pay

## 2021-03-06 MED FILL — Cyanocobalamin Inj 1000 MCG/ML: INTRAMUSCULAR | 30 days supply | Qty: 1 | Fill #3 | Status: AC

## 2021-03-08 ENCOUNTER — Other Ambulatory Visit: Payer: Self-pay

## 2021-03-09 ENCOUNTER — Other Ambulatory Visit: Payer: Self-pay

## 2021-03-09 ENCOUNTER — Ambulatory Visit: Payer: Self-pay | Admitting: Licensed Clinical Social Worker

## 2021-03-09 DIAGNOSIS — F3341 Major depressive disorder, recurrent, in partial remission: Secondary | ICD-10-CM

## 2021-03-09 DIAGNOSIS — F411 Generalized anxiety disorder: Secondary | ICD-10-CM

## 2021-03-09 NOTE — BH Specialist Note (Signed)
Integrated Behavioral Health Follow Up In-Person Visit  MRN: [id number] Name: Renee Bush   Total time: 60 minutes  Types of Service: Telephone visit Patient consents to telephone visit and 2 patient identifiers were used to identify patient   Interpretor:No. Interpretor Name and Language: N/A  Subjective: Renee Bush is a 60 y.o. female accompanied by  herself Patient was referred by Hurman Horn, NP for Mental Health. Patient reports the following symptoms/concerns: The patient reports she has been under more stress than during her last follow-up session. She explained that her mother in law was hospitalized and had a heart stint placed. She stated that she needed to be life flighted to [facility] for a higher level of care, but was discharged a week later. The patient explained that after four days after her mother in law returned  home she had to call 911 because her mother in law became violently ill again. The patient explained that while waiting on 911 her husband and her had to shower her mother in law because of all the vomit and diarrhea. She stated it was exhausting since it is just her husband helping her to care for his mother. She reports her mother in law is currently in rehab trying to regain enough strength to return to home.Renee Bush reports that she went on an all expenses paid trip to the beach with her close friend for the week of the fourth. She discussed being able to see two of her teenage grandchildren that she has not seen in a very long time and how much she enjoyed her vacation. The patient stated that it made her feel good and lifted her spirits to spend time with her grandchildren. The patient discussed that she is taking her psychotropic medication as prescribed, and noted she will need a refill soon. The patient denied any suicidal or homicidal thoughts.  Duration of problem: Years; Severity of problem: moderate  Objective: Mood: Euthymic and  Affect: Appropriate Risk of harm to self or others: No plan to harm self or others  Life Context: Family and Social: see above School/Work: see above Self-Care: see above Life Changes: see above  Patient and/or Family's Strengths/Protective Factors: Concrete supports in place (healthy food, safe environments, etc.)  Goals Addressed: Patient will:  Reduce symptoms of: agitation, anxiety, mood instability, and stress   Increase knowledge and/or ability of: coping skills, healthy habits, self-management skills, and stress reduction   Demonstrate ability to: Increase healthy adjustment to current life circumstances  Progress towards Goals: Ongoing  Interventions: Interventions utilized:  Supportive Counseling was utilized by the clinician during today's follow up session. The clinician processed with the patient how they have been doing since the last follow-up session. The clinician provided a space for the patient to ventilate their frustrations regarding their current life circumstances. Clinician measured the patient's anxiety and depression on a numerical scale.Clinician explained to the patient that despite her stress that it sounds like she has been able to cope better than before. Clinician encouraged the patient to continue to practice self care, utilize her coping skills, and take her psychotropic medications as prescribed. Standardized Assessments completed: GAD-7 and PHQ 9 GAD-7    13 PHQ-9    12   Assessment: Patient currently experiencing see above.   Patient may benefit from see above.  Plan: Follow up with behavioral health clinician on : 03/25/21 at 3:00 PM  Behavioral recommendations:  Referral(s): [facility] (In Clinic) "From scale of 1-10, how  likely are you to follow plan?":   [name], LCSWA

## 2021-03-10 ENCOUNTER — Other Ambulatory Visit: Payer: Self-pay

## 2021-03-10 DIAGNOSIS — E1140 Type 2 diabetes mellitus with diabetic neuropathy, unspecified: Secondary | ICD-10-CM

## 2021-03-10 DIAGNOSIS — Z0000 Encounter for general adult medical examination without abnormal findings: Secondary | ICD-10-CM

## 2021-03-10 NOTE — Progress Notes (Signed)
Patient in today for labs and Vitamin B12 injection. Vitamin B 12 injection1076mcg given IM left deltoid. Patient tolerated without complaint. [id number], exp 04/2022

## 2021-03-11 LAB — COMPREHENSIVE METABOLIC PANEL
ALT: 13 IU/L (ref 0–32)
AST: 14 IU/L (ref 0–40)
Albumin/Globulin Ratio: 2.4 — ABNORMAL HIGH (ref 1.2–2.2)
Albumin: 4.1 g/dL (ref 3.8–4.9)
Alkaline Phosphatase: 58 IU/L (ref 44–121)
BUN/Creatinine Ratio: 19 (ref 12–28)
BUN: 22 mg/dL (ref 8–27)
Bilirubin Total: 0.2 mg/dL (ref 0.0–1.2)
CO2: 25 mmol/L (ref 20–29)
Calcium: 9.2 mg/dL (ref 8.7–10.3)
Chloride: 103 mmol/L (ref 96–106)
Creatinine, Ser: 1.15 mg/dL — ABNORMAL HIGH (ref 0.57–1.00)
Globulin, Total: 1.7 g/dL (ref 1.5–4.5)
Glucose: 207 mg/dL — ABNORMAL HIGH (ref 65–99)
Potassium: 4.9 mmol/L (ref 3.5–5.2)
Sodium: 141 mmol/L (ref 134–144)
Total Protein: 5.8 g/dL — ABNORMAL LOW (ref 6.0–8.5)
eGFR: 55 mL/min/{1.73_m2} — ABNORMAL LOW (ref >59)

## 2021-03-11 LAB — CBC WITH DIFFERENTIAL/PLATELET
Basophils Absolute: 0 10*3/uL (ref 0.0–0.2)
Basos: 0 %
EOS (ABSOLUTE): 0.1 10*3/uL (ref 0.0–0.4)
Eos: 1 %
Hematocrit: 39.7 % (ref 34.0–46.6)
Hemoglobin: 12.7 g/dL (ref 11.1–15.9)
Immature Grans (Abs): 0 10*3/uL (ref 0.0–0.1)
Immature Granulocytes: 0 %
Lymphocytes Absolute: 1.6 10*3/uL (ref 0.7–3.1)
Lymphs: 27 %
MCH: 30.4 pg (ref 26.6–33.0)
MCHC: 32 g/dL (ref 31.5–35.7)
MCV: 95 fL (ref 79–97)
Monocytes Absolute: 0.5 10*3/uL (ref 0.1–0.9)
Monocytes: 8 %
Neutrophils Absolute: 3.7 10*3/uL (ref 1.4–7.0)
Neutrophils: 64 %
Platelets: 181 10*3/uL (ref 150–450)
RBC: 4.18 x10E6/uL (ref 3.77–5.28)
RDW: 12.9 % (ref 11.7–15.4)
WBC: 5.9 10*3/uL (ref 3.4–10.8)

## 2021-03-11 LAB — LIPID PANEL
Chol/HDL Ratio: 4.1 ratio (ref 0.0–4.4)
Cholesterol, Total: 149 mg/dL (ref 100–199)
HDL: 36 mg/dL — ABNORMAL LOW (ref >39)
LDL Chol Calc (NIH): 82 mg/dL (ref 0–99)
Triglycerides: 182 mg/dL — ABNORMAL HIGH (ref 0–149)
VLDL Cholesterol Cal: 31 mg/dL (ref 5–40)

## 2021-03-11 LAB — HEMOGLOBIN A1C
Est. average glucose Bld gHb Est-mCnc: 154 mg/dL
Hgb A1c MFr Bld: 7 % — ABNORMAL HIGH (ref 4.8–5.6)

## 2021-03-11 LAB — B12 AND FOLATE PANEL
Folate: 18.7 ng/mL (ref >3.0)
Vitamin B-12: 418 pg/mL (ref 232–1245)

## 2021-03-15 ENCOUNTER — Other Ambulatory Visit: Payer: Self-pay

## 2021-03-16 ENCOUNTER — Other Ambulatory Visit: Payer: Self-pay

## 2021-03-16 ENCOUNTER — Other Ambulatory Visit: Payer: Self-pay | Admitting: Gerontology

## 2021-03-16 DIAGNOSIS — F3341 Major depressive disorder, recurrent, in partial remission: Secondary | ICD-10-CM

## 2021-03-16 DIAGNOSIS — I10 Essential (primary) hypertension: Secondary | ICD-10-CM

## 2021-03-16 DIAGNOSIS — E1140 Type 2 diabetes mellitus with diabetic neuropathy, unspecified: Secondary | ICD-10-CM

## 2021-03-16 DIAGNOSIS — Z794 Long term (current) use of insulin: Secondary | ICD-10-CM

## 2021-03-16 MED FILL — Glucose Blood Test Strip: 30 days supply | Qty: 100 | Fill #1 | Status: AC

## 2021-03-16 MED FILL — Lancets: 30 days supply | Qty: 100 | Fill #1 | Status: AC

## 2021-03-17 ENCOUNTER — Other Ambulatory Visit: Payer: Self-pay

## 2021-03-17 MED ORDER — TRAZODONE HCL 50 MG PO TABS
ORAL_TABLET | Freq: Every day | ORAL | 0 refills | Status: DC
  Filled 2021-03-17: qty 30, 30d supply, fill #0

## 2021-03-17 MED ORDER — NITROGLYCERIN 0.4 MG SL SUBL
SUBLINGUAL_TABLET | SUBLINGUAL | 0 refills | Status: DC
Start: 2021-03-17 — End: 2021-10-05
  Filled 2021-03-17: qty 25, 25d supply, fill #0

## 2021-03-17 MED ORDER — FERROUS SULFATE 325 (65 FE) MG PO TABS
ORAL_TABLET | ORAL | 1 refills | Status: DC
  Filled 2021-03-17: qty 90, 90d supply, fill #0
  Filled 2021-06-14: qty 90, 90d supply, fill #1

## 2021-03-22 ENCOUNTER — Other Ambulatory Visit: Payer: Self-pay | Admitting: Gerontology

## 2021-03-22 DIAGNOSIS — E1140 Type 2 diabetes mellitus with diabetic neuropathy, unspecified: Secondary | ICD-10-CM

## 2021-03-23 ENCOUNTER — Other Ambulatory Visit: Payer: Self-pay

## 2021-03-23 MED ORDER — GABAPENTIN 400 MG PO CAPS
ORAL_CAPSULE | ORAL | 0 refills | Status: DC
Start: 2021-03-23 — End: 2021-04-19
  Filled 2021-03-23: qty 120, 30d supply, fill #0

## 2021-03-25 ENCOUNTER — Ambulatory Visit: Payer: Self-pay | Admitting: Licensed Clinical Social Worker

## 2021-03-25 ENCOUNTER — Other Ambulatory Visit: Payer: Self-pay | Admitting: Gerontology

## 2021-03-25 ENCOUNTER — Other Ambulatory Visit: Payer: Self-pay

## 2021-03-25 DIAGNOSIS — F3341 Major depressive disorder, recurrent, in partial remission: Secondary | ICD-10-CM

## 2021-03-25 DIAGNOSIS — F411 Generalized anxiety disorder: Secondary | ICD-10-CM

## 2021-03-25 MED ORDER — DULOXETINE HCL 30 MG PO CPEP
ORAL_CAPSULE | Freq: Every day | ORAL | 0 refills | Status: DC
  Filled 2021-03-25 – 2021-03-26 (×2): qty 90, 30d supply, fill #0

## 2021-03-25 NOTE — BH Specialist Note (Signed)
Integrated Behavioral Health Follow Up In-Person Visit  MRN: [id number] Name: Renee Bush   Total time: 60 minutes  Types of Service: Telephone visit Patient consents to telephone visit and 2 patient identifiers were used to identify patient   Interpretor:No. Interpretor Name and Language: N/A  Subjective: Renee Bush is a 60 y.o. female accompanied by  herself Patient was referred by Hurman Horn, NP for mental health. Patient reports the following symptoms/concerns: The patient notes that she has had some good days and some hard days since her last follow-up visit. She explained that after her last follow-up visit her mother-in-law became worse and they had to call the EMT's for a second time to rush her to the emergency room. She explained that her mother- law stayed in the hospital for two more weeks, then was transferred to [facility] for rehab. Katrena shared that today is her mother-in-laws 72 th birthday and she was able to come home after being away for five weeks. She explained that she is close to her mother in law and she is her main care giver along with her husband. The patient explained that she is watching four of her grandchildren for the summer and looks forward to school starting up. She dicussed other financial and familial stressors in her life. The patient denied any suicidal or homicidal thoughts.  Duration of problem: Years; Severity of problem: moderate  Objective: Mood: Euthymic and Affect: Appropriate Risk of harm to self or others: No plan to harm self or others  Life Context: Family and Social: see above School/Work: see above Self-Care: see above Life Changes: see above  Patient and/or Family's Strengths/Protective Factors: Concrete supports in place (healthy food, safe environments, etc.)  Goals Addressed: Patient will:  Reduce symptoms of: agitation, anxiety, depression, insomnia, and stress   Increase knowledge and/or ability  of: coping skills, healthy habits, self-management skills, and stress reduction   Demonstrate ability to: Increase healthy adjustment to current life circumstances  Progress towards Goals: Ongoing  Interventions: Interventions utilized:  Supportive Counselingwas utilized by the clinician during today's follow up session. The clinician processed with the patient how they have been doing since the last follow-up session. The clinician provided a space for the patient to ventilate their frustrations regarding their current life circumstances. Clinician measured the patient's anxiety and depression on a numerical scale. The clinician encouraged the patient to utilize their coping skills to deal with their current life circumstances.  Clinician encouraged the patient to take their mediation at the same time everyday exactly as prescribed for it to reach it's full intended effect.    Standardized Assessments completed: GAD-7 and PHQ 9 GAD-7            14 PHQ-9            15   Assessment: Patient currently experiencing see above.   Patient may benefit from see above.  Plan: Follow up with behavioral health clinician on : 04/13/2021 at 4:00 PM  Behavioral recommendations:  Referral(s): [facility] (In Clinic) "From scale of 1-10, how likely are you to follow plan?":   Judith Part, LCSWA

## 2021-03-26 ENCOUNTER — Other Ambulatory Visit: Payer: Self-pay | Admitting: Gerontology

## 2021-03-26 ENCOUNTER — Other Ambulatory Visit: Payer: Self-pay

## 2021-03-26 DIAGNOSIS — F3341 Major depressive disorder, recurrent, in partial remission: Secondary | ICD-10-CM

## 2021-03-29 ENCOUNTER — Other Ambulatory Visit: Payer: Self-pay

## 2021-04-03 MED FILL — Lancets: 30 days supply | Qty: 100 | Fill #2 | Status: CN

## 2021-04-05 ENCOUNTER — Other Ambulatory Visit: Payer: Self-pay

## 2021-04-06 ENCOUNTER — Other Ambulatory Visit: Payer: Self-pay

## 2021-04-08 ENCOUNTER — Other Ambulatory Visit: Payer: Self-pay | Admitting: Gerontology

## 2021-04-12 MED FILL — Cyanocobalamin Inj 1000 MCG/ML: INTRAMUSCULAR | 30 days supply | Qty: 1 | Fill #4 | Status: AC

## 2021-04-13 ENCOUNTER — Other Ambulatory Visit: Payer: Self-pay

## 2021-04-13 ENCOUNTER — Ambulatory Visit: Payer: Self-pay | Admitting: Licensed Clinical Social Worker

## 2021-04-13 DIAGNOSIS — F3341 Major depressive disorder, recurrent, in partial remission: Secondary | ICD-10-CM

## 2021-04-13 DIAGNOSIS — F411 Generalized anxiety disorder: Secondary | ICD-10-CM

## 2021-04-13 MED ORDER — INSULIN PEN NEEDLE 32G X 6 MM MISC
99 refills | Status: DC
  Filled 2021-04-13: qty 100, 25d supply, fill #0
  Filled 2021-10-20: qty 100, 25d supply, fill #1
  Filled 2022-02-24: qty 100, 25d supply, fill #0
  Filled 2022-02-24: qty 100, 25d supply, fill #2

## 2021-04-13 NOTE — BH Specialist Note (Signed)
Integrated Behavioral Health Follow Up In-Person Visit  MRN: [id number] Name: Renee Bush   Total time: 30 minutes  Types of Service: Telephone visit Patient consents to telephone visit and 2 patient identifiers were used to identify patient   Interpretor:No. Interpretor Name and Language: N/A  Subjective: Renee Bush is a 60 y.o. female accompanied by   herself Patient was referred by Hurman Horn, NP  for mental health. Patient reports the following symptoms/concerns: The patient reports that she has been doing about the same since her last follow up session. She noted that she is still experiencing difficulty falling asleep and frequently wakes up throughout the night. She discussed increased stressors in her life. She shared that her mother in law is very ill and the family called in hospice today. She noted that someone has to stay with her mother in law at all times and until extended family can arrive that her and her husband are working in shifts to provide 24 hour care. Additionally, Marleen stated that she takes care of her grandchildren during the day. She expressed feeling worn thin. She noted that she takes her Cymbalta and Trazodone daily as prescribed. The patient denied any suicidal or homicidal thoughts.  Duration of problem: Years; Severity of problem: moderate  Objective: Mood: Euthymic and Affect: Appropriate Risk of harm to self or others: No plan to harm self or others  Life Context: Family and Social: see above School/Work: see above Self-Care: see above Life Changes: see above  Patient and/or Family's Strengths/Protective Factors: Concrete supports in place (healthy food, safe environments, etc.)  Goals Addressed: Patient will:  Reduce symptoms of: agitation, anxiety, depression, insomnia, and stress   Increase knowledge and/or ability of: coping skills, healthy habits, self-management skills, and stress reduction   Demonstrate ability  to: Increase healthy adjustment to current life circumstances  Progress towards Goals: Ongoing  Interventions: Interventions utilized:  Supportive Counseling and Sleep Hygienewas utilized by the clinician during today's follow up session. The clinician processed with the patient how they have been doing since the last follow-up session. The clinician provided a space for the patient to ventilate their frustrations regarding their current life circumstances. Clinician measured the patient's anxiety and depression on a numerical scale.  Clinician explored the patient's relationship with her mother in law.  Clinician offered the patient her condolences and encouraged the patient to practice self care daily and reach out for support when needed.   Standardized Assessments completed: GAD-7 and PHQ 9 GAD-7      17 PHQ-9      15 Assessment: Patient currently experiencing see above.   Patient may benefit from see above.  Plan: Follow up with behavioral health clinician on : 04/28/2021 at 4:00 Pm  Behavioral recommendations:  Referral(s): [facility] (In Clinic) "From scale of 1-10, how likely are you to follow plan?":   Judith Part, LCSWA

## 2021-04-14 ENCOUNTER — Other Ambulatory Visit: Payer: Self-pay

## 2021-04-14 NOTE — Progress Notes (Signed)
Patient in today for her monthly Vitamin B12 injection. Vitamin B12 injection 1065mcg/1ml given IM Left Deltoid. Patient tolerated well without incident. [id number], exp 04/2022. Patient will return in 1 month for next injection.

## 2021-04-19 ENCOUNTER — Other Ambulatory Visit: Payer: Self-pay | Admitting: Gerontology

## 2021-04-19 DIAGNOSIS — E1140 Type 2 diabetes mellitus with diabetic neuropathy, unspecified: Secondary | ICD-10-CM

## 2021-04-19 DIAGNOSIS — I10 Essential (primary) hypertension: Secondary | ICD-10-CM

## 2021-04-19 DIAGNOSIS — Z794 Long term (current) use of insulin: Secondary | ICD-10-CM

## 2021-04-19 MED ORDER — GABAPENTIN 400 MG PO CAPS
ORAL_CAPSULE | ORAL | 0 refills | Status: DC
  Filled 2021-04-19: qty 120, 30d supply, fill #0

## 2021-04-19 MED ORDER — METOPROLOL TARTRATE 50 MG PO TABS
ORAL_TABLET | ORAL | 1 refills | Status: DC
Start: 2021-04-19 — End: 2021-06-17
  Filled 2021-04-19: qty 90, 30d supply, fill #0
  Filled 2021-06-02: qty 90, 30d supply, fill #1

## 2021-04-20 ENCOUNTER — Other Ambulatory Visit: Payer: Self-pay

## 2021-04-22 ENCOUNTER — Other Ambulatory Visit: Payer: Self-pay

## 2021-04-28 ENCOUNTER — Ambulatory Visit: Payer: Self-pay | Admitting: Licensed Clinical Social Worker

## 2021-04-29 ENCOUNTER — Other Ambulatory Visit: Payer: Self-pay | Admitting: Gerontology

## 2021-04-29 ENCOUNTER — Other Ambulatory Visit: Payer: Self-pay

## 2021-04-29 DIAGNOSIS — F3341 Major depressive disorder, recurrent, in partial remission: Secondary | ICD-10-CM

## 2021-04-29 MED FILL — Trazodone HCl Tab 50 MG: ORAL | 30 days supply | Qty: 30 | Fill #0 | Status: AC

## 2021-04-29 MED FILL — Glucose Blood Test Strip: 30 days supply | Qty: 100 | Fill #2 | Status: AC

## 2021-04-29 MED FILL — Lancets: 30 days supply | Qty: 100 | Fill #2 | Status: AC

## 2021-04-29 MED FILL — Duloxetine HCl Enteric Coated Pellets Cap 30 MG (Base Eq): ORAL | 30 days supply | Qty: 90 | Fill #0 | Status: AC

## 2021-05-05 ENCOUNTER — Other Ambulatory Visit: Payer: Self-pay

## 2021-05-05 VITALS — BP 118/77 | HR 81 | Temp 96.3°F | Ht 62.0 in | Wt 164.7 lb

## 2021-05-05 DIAGNOSIS — F3341 Major depressive disorder, recurrent, in partial remission: Secondary | ICD-10-CM

## 2021-05-06 LAB — BASIC METABOLIC PANEL
BUN/Creatinine Ratio: 13 (ref 12–28)
BUN: 15 mg/dL (ref 8–27)
CO2: 24 mmol/L (ref 20–29)
Calcium: 9.3 mg/dL (ref 8.7–10.3)
Chloride: 104 mmol/L (ref 96–106)
Creatinine, Ser: 1.17 mg/dL — ABNORMAL HIGH (ref 0.57–1.00)
Glucose: 134 mg/dL — ABNORMAL HIGH (ref 65–99)
Potassium: 4.9 mmol/L (ref 3.5–5.2)
Sodium: 143 mmol/L (ref 134–144)
eGFR: 53 mL/min/{1.73_m2} — ABNORMAL LOW (ref >59)

## 2021-05-10 ENCOUNTER — Telehealth: Payer: Self-pay | Admitting: Pharmacist

## 2021-05-10 ENCOUNTER — Other Ambulatory Visit: Payer: Self-pay

## 2021-05-10 MED FILL — Cyanocobalamin Inj 1000 MCG/ML: INTRAMUSCULAR | 30 days supply | Qty: 1 | Fill #5 | Status: AC

## 2021-05-10 NOTE — Telephone Encounter (Signed)
[name] - Monday, May 10, 2021 10:58 AM --Received notice from [place] that enrollment for Cymbalta ends 06/08/2021. I have printed Lilly renewal -- mailing to patient to sign & return, also putting in [facility] folder for Angelica to sign for renewal.

## 2021-05-11 ENCOUNTER — Encounter: Payer: Self-pay | Admitting: Gerontology

## 2021-05-11 ENCOUNTER — Other Ambulatory Visit: Payer: Self-pay

## 2021-05-11 ENCOUNTER — Other Ambulatory Visit: Payer: Self-pay | Admitting: Gerontology

## 2021-05-11 ENCOUNTER — Ambulatory Visit: Payer: Self-pay | Admitting: Adult Health

## 2021-05-11 ENCOUNTER — Other Ambulatory Visit: Payer: Self-pay | Admitting: Adult Health

## 2021-05-11 VITALS — BP 117/79 | HR 84 | Temp 97.4°F | Resp 16 | Ht 62.0 in | Wt 165.4 lb

## 2021-05-11 DIAGNOSIS — E1159 Type 2 diabetes mellitus with other circulatory complications: Secondary | ICD-10-CM

## 2021-05-11 DIAGNOSIS — S43401A Unspecified sprain of right shoulder joint, initial encounter: Secondary | ICD-10-CM

## 2021-05-11 DIAGNOSIS — N183 Chronic kidney disease, stage 3 unspecified: Secondary | ICD-10-CM

## 2021-05-11 DIAGNOSIS — F5101 Primary insomnia: Secondary | ICD-10-CM

## 2021-05-11 DIAGNOSIS — E1140 Type 2 diabetes mellitus with diabetic neuropathy, unspecified: Secondary | ICD-10-CM

## 2021-05-11 DIAGNOSIS — E1169 Type 2 diabetes mellitus with other specified complication: Secondary | ICD-10-CM

## 2021-05-11 DIAGNOSIS — F331 Major depressive disorder, recurrent, moderate: Secondary | ICD-10-CM

## 2021-05-11 DIAGNOSIS — I152 Hypertension secondary to endocrine disorders: Secondary | ICD-10-CM

## 2021-05-11 DIAGNOSIS — Z794 Long term (current) use of insulin: Secondary | ICD-10-CM

## 2021-05-11 DIAGNOSIS — E1122 Type 2 diabetes mellitus with diabetic chronic kidney disease: Secondary | ICD-10-CM

## 2021-05-11 MED ORDER — DICLOFENAC SODIUM 1 % EX GEL
2.0000 g | Freq: Four times a day (QID) | CUTANEOUS | 0 refills | Status: DC
Start: 2021-05-11 — End: 2021-07-13
  Filled 2021-05-11: qty 200, 25d supply, fill #0

## 2021-05-11 MED ORDER — DICLOFENAC SODIUM 3 % EX GEL
1.0000 "application " | Freq: Two times a day (BID) | CUTANEOUS | 0 refills | Status: DC
  Filled 2021-05-11: qty 100, 14d supply, fill #0

## 2021-05-11 MED ORDER — PREDNISONE 10 MG PO TABS
10.0000 mg | ORAL_TABLET | Freq: Every day | ORAL | 0 refills | Status: DC
  Filled 2021-05-11: qty 22, 7d supply, fill #0

## 2021-05-11 MED ORDER — MELATONIN 10 MG PO TABS
10.0000 mg | ORAL_TABLET | Freq: Every evening | ORAL | 2 refills | Status: DC | PRN
  Filled 2021-05-11 – 2021-06-15 (×2): qty 30, fill #0

## 2021-05-11 MED ORDER — GABAPENTIN 400 MG PO CAPS
ORAL_CAPSULE | ORAL | 0 refills | Status: DC
  Filled 2021-05-11: qty 120, fill #0
  Filled 2021-05-20: qty 120, 30d supply, fill #0

## 2021-05-11 NOTE — Patient Instructions (Signed)
Insomnia Insomnia is a sleep disorder that makes it difficult to fall asleep or stay asleep. Insomnia can cause fatigue, low energy, difficulty concentrating, moodswings, and poor performance at work or school. There are three different ways to classify insomnia: Difficulty falling asleep. Difficulty staying asleep. Waking up too early in the morning. Any type of insomnia can be long-term (chronic) or short-term (acute). Both are common. Short-term insomnia usually lasts for three months or less. Chronic insomnia occurs at least three times a week for longer than threemonths. What are the causes? Insomnia may be caused by another condition, situation, or substance, such as: Anxiety. Certain medicines. Gastroesophageal reflux disease (GERD) or other gastrointestinal conditions. Asthma or other breathing conditions. Restless legs syndrome, sleep apnea, or other sleep disorders. Chronic pain. Menopause. Stroke. Abuse of alcohol, tobacco, or illegal drugs. Mental health conditions, such as depression. Caffeine. Neurological disorders, such as Alzheimer's disease. An overactive thyroid (hyperthyroidism). Sometimes, the cause of insomnia may not be known. What increases the risk? Risk factors for insomnia include: Gender. Women are affected more often than men. Age. Insomnia is more common as you get older. Stress. Lack of exercise. Irregular work schedule or working night shifts. Traveling between different time zones. Certain medical and mental health conditions. What are the signs or symptoms? If you have insomnia, the main symptom is having trouble falling asleep or having trouble staying asleep. This may lead to other symptoms, such as: Feeling fatigued or having low energy. Feeling nervous about going to sleep. Not feeling rested in the morning. Having trouble concentrating. Feeling irritable, anxious, or depressed. How is this diagnosed? This condition may be diagnosed based  on: Your symptoms and medical history. Your health care provider may ask about: Your sleep habits. Any medical conditions you have. Your mental health. A physical exam. How is this treated? Treatment for insomnia depends on the cause. Treatment may focus on treating an underlying condition that is causing insomnia. Treatment may also include: Medicines to help you sleep. Counseling or therapy. Lifestyle adjustments to help you sleep better. Follow these instructions at home: Eating and drinking  Limit or avoid alcohol, caffeinated beverages, and cigarettes, especially close to bedtime. These can disrupt your sleep. Do not eat a large meal or eat spicy foods right before bedtime. This can lead to digestive discomfort that can make it hard for you to sleep.  Sleep habits  Keep a sleep diary to help you and your health care provider figure out what could be causing your insomnia. Write down: When you sleep. When you wake up during the night. How well you sleep. How rested you feel the next day. Any side effects of medicines you are taking. What you eat and drink. Make your bedroom a dark, comfortable place where it is easy to fall asleep. Put up shades or blackout curtains to block light from outside. Use a white noise machine to block noise. Keep the temperature cool. Limit screen use before bedtime. This includes: Watching TV. Using your smartphone, tablet, or computer. Stick to a routine that includes going to bed and waking up at the same times every day and night. This can help you fall asleep faster. Consider making a quiet activity, such as reading, part of your nighttime routine. Try to avoid taking naps during the day so that you sleep better at night. Get out of bed if you are still awake after 15 minutes of trying to sleep. Keep the lights down, but try reading or doing a quiet   activity. When you feel sleepy, go back to bed.  General instructions Take over-the-counter  and prescription medicines only as told by your health care provider. Exercise regularly, as told by your health care provider. Avoid exercise starting several hours before bedtime. Use relaxation techniques to manage stress. Ask your health care provider to suggest some techniques that may work well for you. These may include: Breathing exercises. Routines to release muscle tension. Visualizing peaceful scenes. Make sure that you drive carefully. Avoid driving if you feel very sleepy. Keep all follow-up visits as told by your health care provider. This is important. Contact a health care provider if: You are tired throughout the day. You have trouble in your daily routine due to sleepiness. You continue to have sleep problems, or your sleep problems get worse. Get help right away if: You have serious thoughts about hurting yourself or someone else. If you ever feel like you may hurt yourself or others, or have thoughts about taking your own life, get help right away. You can go to your nearest emergency department or call: Your local emergency services (911 in the [place] A suicide crisis helpline, such as the National Suicide Prevention Lifeline at 1-800-273-8255. This is open 24 hours a day. Summary Insomnia is a sleep disorder that makes it difficult to fall asleep or stay asleep. Insomnia can be long-term (chronic) or short-term (acute). Treatment for insomnia depends on the cause. Treatment may focus on treating an underlying condition that is causing insomnia. Keep a sleep diary to help you and your health care provider figure out what could be causing your insomnia. This information is not intended to replace advice given to you by your health care provider. Make sure you discuss any questions you have with your healthcare provider. Document Revised: 06/25/2020 Document Reviewed: 06/25/2020 Elsevier Patient Education  2022 [employer]  

## 2021-05-11 NOTE — Progress Notes (Signed)
Diclofenac changed to 1% gel. Pharmacy does not have 35

## 2021-05-11 NOTE — Progress Notes (Signed)
Patient: Renee Bush Female    DOB: [birth year]   60 y.o.   MRN: [id number] Visit Date: 05/11/2021  Today's Provider: Deforest Hoyles, NP   Chief Complaint  Patient presents with   Follow-up    Labs drawn 05/05/21   Diabetes    Patient checked her blood sugar fasting this morning and it was 189. Patient states she is taking her medications as prescribed.   Hypertension    Patient is not checking her blood pressure at home, but states she is taking her medications as prescribed.   Shoulder Pain    Patient is c/o right shoulder pain x ~3 weeks. Patient thinks she may have pulled a muscle lifting her mother in law.   Subjective:    Diabetes  Hypertension  Shoulder Pain  : This is a 60 y/o female with a h/o T2DM, HTN, hyperlipidemia who presents for f/u. She is c/o right shoulder pain that started about three weeks ago after she lifted her mother in-law. She rates the pain as 8/10, radiating to her neck and shoulder blade, worse when she moves the arm, and she can't think of any alleviating factors. She tried lidocaine patches and tylenol without any significant improvement. She has been doing a lot of lifting lately.  She lost her mother-in-law about two weeks ago and this has been very hard on her. She reports sleep difficulty despite being on $Remove'100mg'pUMlqXo$  of trazadone. She is on cymbalta and denies suicidal ideation. She reports increased symptoms since the passing of her mother-in-law. She says being with her daughter and grand kids help her cope with the loss. Her blood pressure has been good. She is not exercising routinely and she reports adhering to her diet and medications. She checks her blood sugar every other day. Her fasting blood glucose is still greater than $RemoveBefo'130mg'ZDFafMxqUdB$ /dl. Her last HgA1C was 7%. Her GFR continues to decline. She is on an ACEI already. She is on the wait list for an eye exam. She continues to maintain smoking abstinence. She denies chest pain, palpitations,  dizziness and headache. She does not check her feet at the end of the day. Denies any medication side effects.   Allergies  Allergen Reactions   Etodolac Rash   Penicillin G Rash   Penicillins Rash    Has patient had a PCN reaction causing immediate rash, facial/tongue/throat swelling, SOB or lightheadedness with hypotension: Yes Has patient had a PCN reaction causing severe rash involving mucus membranes or skin necrosis: No Has patient had a PCN reaction that required hospitalization: No Has patient had a PCN reaction occurring within the last 10 years: No If all of the above answers are "NO", then may proceed with Cephalosporin use.   Previous Medications   ASPIRIN EC 81 MG TABLET    Take 1 tablet (81 mg total) by mouth daily.   BLOOD GLUCOSE MONITORING SUPPL (RIGHTEST GM550 BLOOD GLUCOSE) W/DEVICE KIT    AS DIRECTED   CANAGLIFLOZIN (INVOKANA) 100 MG TABS TABLET    TAKE ONE TABLET BY MOUTH EVERY DAY BEFORE BREAKFAST   CETIRIZINE (ZYRTEC) 10 MG TABLET    TAKE ONE TABLET BY MOUTH EVERY DAY   CYANOCOBALAMIN (,VITAMIN B-12,) 1000 MCG/ML INJECTION    INJECT 1 ML INTO THE MUSCLE EVERY 30 DAYS   DULAGLUTIDE 3 MG/0.5ML SOPN    INJECT $Remov'3MG'PRSGMw$  INTO THE SKIN ONCE A WEEK AS DIRECTED   DULOXETINE (CYMBALTA) 30 MG CAPSULE    TAKE 3 CAPSULES BY MOUTH ONCE EVERY DAY.  FERROUS SULFATE (FEROSUL) 325 (65 FE) MG TABLET    TAKE ONE TABLET BY MOUTH EVERY DAY WITH BREAKFAST.   GABAPENTIN (NEURONTIN) 400 MG CAPSULE    TAKE ONE CAPSULE BY MOUTH 2 TIMES A DAY AND TAKE 2 CAPSULES BY MOUTH AT BEDTIME   GLUCOSE BLOOD TEST STRIP    USE AS DIRECTED   INSULIN GLARGINE (BASAGLAR KWIKPEN) 100 UNIT/ML    INJECT 17 UNITS UNDER THE SKIN ONCE DAILY AT BEDTIME.   INSULIN PEN NEEDLE 32G X 6 MM MISC    Use as directed.   LISINOPRIL (ZESTRIL) 10 MG TABLET    TAKE ONE TABLET BY MOUTH EVERY DAY   METFORMIN (GLUCOPHAGE) 500 MG TABLET    TAKE ONE TABLET BY MOUTH 2 TIMES A DAY   METOPROLOL TARTRATE (LOPRESSOR) 50 MG TABLET    TAKE 1  1/2 TABLETS ($RemoveBefo'75MG'skWgaISOLuf$ ) BY MOUTH TWICE DAILY.   MONTELUKAST (SINGULAIR) 10 MG TABLET    TAKE ONE TABLET BY MOUTH AT ONCE DAILY AT BEDTIME.   NITROGLYCERIN (NITROSTAT) 0.4 MG SL TABLET    DISSOLVE 1 TABLET UNDER TONGUE AS NEEDED FOR CHEST PAIN EVERY 5 MINUTES FOR MAX OF 3 DOSES IN 15 MINUTES. IF NO RELIEF AFTER FIRST DOSE CALL 911.   OMEGA-3 FATTY ACIDS (FISH OIL) 1000 MG CAPS    Take 1,000 mg by mouth 2 (two) times daily.   OMEPRAZOLE (PRILOSEC) 20 MG CAPSULE    TAKE ONE CAPSULE BY MOUTH 2 TIMES A DAY BEFORE MEALS   RIGHTEST GL300 LANCETS MISC    USE AS DIRECTED   ROSUVASTATIN (CRESTOR) 5 MG TABLET    TAKE ONE TABLET BY MOUTH ONCE DAILY   TRAZODONE (DESYREL) 50 MG TABLET    TAKE ONE TABLET BY MOUTH ONCE DAILY AT BEDTIME.    Review of Systems  Constitutional: Negative.   HENT: Negative.    Eyes:  Positive for visual disturbance (uses glasses).  Respiratory: Negative.    Cardiovascular: Negative.   Gastrointestinal: Negative.   Endocrine: Negative.   Genitourinary: Negative.   Musculoskeletal:  Positive for arthralgias (right shoulder pain).  Skin: Negative.   Neurological: Negative.   Hematological: Negative.   Psychiatric/Behavioral:  Positive for dysphoric mood and sleep disturbance. Negative for suicidal ideas.    Social History   Tobacco Use   Smoking status: Former    Types: Cigarettes    Quit date: 04/17/2005    Years since quitting: 16.0   Smokeless tobacco: Never  Substance Use Topics   Alcohol use: Not Currently    Alcohol/week: 14.0 standard drinks    Types: 14 Cans of beer per week    Comment: quit ~2020   Objective:   BP 117/79 (BP Location: Left Arm, Patient Position: Sitting, Cuff Size: Large)   Pulse 84   Temp (!) 97.4 F (36.3 C)   Resp 16   Ht $R'5\' 2"'dP$  (1.575 m)   Wt 165 lb 6.4 oz (75 kg)   SpO2 98%   BMI 30.25 kg/m   Physical Exam Vitals and nursing note reviewed.  Constitutional:      Appearance: Normal appearance. She is obese.     Comments: Moderate  central obesity  HENT:     Head: Normocephalic and atraumatic.     Nose: Nose normal.     Mouth/Throat:     Mouth: Mucous membranes are moist.  Eyes:     Extraocular Movements: Extraocular movements intact.     Conjunctiva/sclera: Conjunctivae normal.     Pupils: Pupils are equal,  round, and reactive to light.  Cardiovascular:     Rate and Rhythm: Normal rate and regular rhythm.     Pulses: Normal pulses.     Heart sounds: Normal heart sounds.  Pulmonary:     Effort: Pulmonary effort is normal.     Breath sounds: Normal breath sounds.  Abdominal:     General: Abdomen is flat. Bowel sounds are normal.     Palpations: Abdomen is soft.  Musculoskeletal:        General: Tenderness (pain with ROM of right shoulder) and signs of injury (right shoulder) present.     Cervical back: Normal range of motion and neck supple.  Skin:    General: Skin is warm and dry.     Capillary Refill: Capillary refill takes less than 2 seconds.  Neurological:     General: No focal deficit present.     Mental Status: She is alert and oriented to person, place, and time.     Sensory: No sensory deficit.     Motor: No weakness.     Gait: Gait normal.  Psychiatric:        Mood and Affect: Mood is depressed.        Speech: Speech normal.        Behavior: Behavior normal. Behavior is cooperative.        Thought Content: Thought content normal.      Assessment & Plan:  1. Type 2 diabetes mellitus with diabetic neuropathy, with long-term current use of insulin (HCC) Moderate control. Will review her regimen with pharmacy given the progressive decline in GFR on invokanna. Her GFR is 53. For now she will be maintained on current regimen.  - gabapentin (NEURONTIN) 400 MG capsule; TAKE ONE CAPSULE BY MOUTH 2 TIMES A DAY AND TAKE 2 CAPSULES BY MOUTH AT BEDTIME  Dispense: 120 capsule; Refill: 0 - Comp Met (CMET); Future - HgB A1c; Future - Lipid Profile; Future - Urine Microalbumin w/creat. ratio; Future -  TSH  2. Sprain of right shoulder, unspecified shoulder sprain type, initial encounter Based on history and exam, the pain is more consistent with a shoulder sprain. Will give her a prednisone taper and topical diclofenac. If she does not get better, we will refer to ortho for further evaluation. She is to continue taking prn tylenol.   3. Hypertension associated with diabetes ([place]) Blood pressure is well controlled. She is to continue her current regimen  4. Hyperlipidemia associated with type 2 diabetes mellitus ([place]) Continue crestor. Will repeat lipid profile in 3 months  5. CKD stage 3a due to type 2 diabetes mellitus ([place]) I am concerned about her declining kidney function on invokanna. Will consult with pharmacy and also repeat GFR with urine microalbuminuria at next visit  6. Primary insomnia With associated depression. Continue trazodone and start melatonin $RemoveBeforeD'10mg'CVvjIvtDEwdUfr$  qhs. Sleep hygiene reviewed  7. Moderate episode of recurrent major depressive disorder [facility]) She is on cymbalta and also following up with the Mental health counselor. She has an upcoming appointment. Advised to call the clinic if she feels like her symptoms are worsening RTC in 3 months, labs 1 week prior     Deforest Hoyles, NP   [facility]

## 2021-05-12 ENCOUNTER — Ambulatory Visit: Payer: Self-pay | Admitting: Licensed Clinical Social Worker

## 2021-05-18 ENCOUNTER — Ambulatory Visit: Payer: Self-pay | Admitting: Licensed Clinical Social Worker

## 2021-05-18 ENCOUNTER — Other Ambulatory Visit: Payer: Self-pay

## 2021-05-18 DIAGNOSIS — F411 Generalized anxiety disorder: Secondary | ICD-10-CM

## 2021-05-18 DIAGNOSIS — F331 Major depressive disorder, recurrent, moderate: Secondary | ICD-10-CM

## 2021-05-18 NOTE — BH Specialist Note (Signed)
Integrated Behavioral Health Follow Up Telephone Visit  MRN: [id number] Name: Renee Bush   Total time: 60 minutes  Types of Service: Telephone visit Patient consents to telephone visit and 2 patient identifiers were used to identify patient   Interpretor:No. Interpretor Name and Language: N/A  Subjective: Renee Bush is a 60 y.o. female accompanied by  herself  Patient was referred by Hurman Horn, NP for mental health. Patient reports the following symptoms/concerns: The patient reports that she has been doing worse overall since her last follow-up appointment. Caidyn discussed the recent loss of her mother-in -law and their relationship. She shared that she feels alone and depressed. She explained that hospice has come by to check in with her and her family, and found that to be helpful. She shared that she is struggling with frequent crying spells and loss of appetite. She discussed other family and financial stressors impacting her life. The patient noted she is babysitting he grandchildren and feels wore down. She stated that she has not had any time for herself. The patient denied any suicidal or homicidal thoughts. Duration of problem: Years; Severity of problem: moderate  Objective: Mood: Depressed and Affect: Appropriate Risk of harm to self or others: No plan to harm self or others  Life Context: Family and Social: see above School/Work: see above Self-Care: see above Life Changes: see above  Patient and/or Family's Strengths/Protective Factors: Social and Emotional competence, Concrete supports in place (healthy food, safe environments, etc.), Sense of purpose, and Parental Resilience  Goals Addressed: Patient will:  Reduce symptoms of: anxiety, depression, mood instability, and stress   Increase knowledge and/or ability of: coping skills, healthy habits, self-management skills, and stress reduction   Demonstrate ability to: Increase healthy  adjustment to current life circumstances and Increase adequate support systems for patient/family  Progress towards Goals: Ongoing  Interventions: Interventions utilized:  CBT Cognitive Behavioral Therapywas utilized by the clinician during today's follow up session. The clinician processed with the patient how they have been doing since the last follow-up session. The clinician provided a space for the patient to ventilate their frustrations regarding their current life circumstances. Clinician introduced distress tolerance skills to the patient and explained that negative emotions will usually lessen in intensity and pass over time. Clinician measured the patient's anxiety and depression on a numerical scale.  Clinician encouraged the patient to take their mediation at the same time everyday exactly as prescribed for it to reach it's full intended effect. The clinician encouraged the patient to utilize their coping skills to deal with their current life circumstances and to practice daily self care.   Standardized Assessments completed: GAD-7 and PHQ 9 PHQ-9    17 GAD-7    18  Assessment: Patient currently experiencing see above.   Patient may benefit from see above.  Plan: Follow up with behavioral health clinician on : 06/03/2021 at 11:00 AM  Behavioral recommendations:  Referral(s): Integrated [facility] [facility] (In Clinic) "From scale of 1-10, how likely are you to follow plan?":   Judith Part, LCSWA

## 2021-05-21 ENCOUNTER — Other Ambulatory Visit: Payer: Self-pay

## 2021-05-24 ENCOUNTER — Other Ambulatory Visit: Payer: Self-pay

## 2021-05-24 ENCOUNTER — Other Ambulatory Visit: Payer: Self-pay | Admitting: Gerontology

## 2021-05-24 DIAGNOSIS — F3341 Major depressive disorder, recurrent, in partial remission: Secondary | ICD-10-CM

## 2021-05-25 ENCOUNTER — Other Ambulatory Visit: Payer: Self-pay | Admitting: Gerontology

## 2021-05-25 ENCOUNTER — Other Ambulatory Visit: Payer: Self-pay

## 2021-05-25 ENCOUNTER — Telehealth: Payer: Self-pay | Admitting: Pharmacist

## 2021-05-25 DIAGNOSIS — F3341 Major depressive disorder, recurrent, in partial remission: Secondary | ICD-10-CM

## 2021-05-25 MED ORDER — TRAZODONE HCL 100 MG PO TABS
100.0000 mg | ORAL_TABLET | Freq: Every day | ORAL | 0 refills | Status: DC
Start: 2021-05-25 — End: 2021-07-04
  Filled 2021-05-25: qty 30, 30d supply, fill #0

## 2021-05-25 MED FILL — Duloxetine HCl Enteric Coated Pellets Cap 30 MG (Base Eq): ORAL | Qty: 90 | Fill #0 | Status: CN

## 2021-05-25 NOTE — Telephone Encounter (Signed)
--   [name] - Tuesday, May 25, 2021 9:35 AM --Daylene Posey renewal for Cymbalta 30mg  (Take 3 capsules by mouth every day) # 12 bottles.

## 2021-05-26 ENCOUNTER — Other Ambulatory Visit: Payer: Self-pay

## 2021-05-27 ENCOUNTER — Other Ambulatory Visit: Payer: Self-pay

## 2021-06-02 ENCOUNTER — Other Ambulatory Visit: Payer: Self-pay

## 2021-06-02 ENCOUNTER — Other Ambulatory Visit: Payer: Self-pay | Admitting: Gerontology

## 2021-06-02 DIAGNOSIS — I10 Essential (primary) hypertension: Secondary | ICD-10-CM

## 2021-06-02 DIAGNOSIS — Z889 Allergy status to unspecified drugs, medicaments and biological substances status: Secondary | ICD-10-CM

## 2021-06-03 ENCOUNTER — Other Ambulatory Visit: Payer: Self-pay

## 2021-06-03 ENCOUNTER — Ambulatory Visit: Payer: Self-pay | Admitting: Licensed Clinical Social Worker

## 2021-06-03 DIAGNOSIS — F331 Major depressive disorder, recurrent, moderate: Secondary | ICD-10-CM

## 2021-06-03 DIAGNOSIS — F411 Generalized anxiety disorder: Secondary | ICD-10-CM

## 2021-06-03 MED ORDER — LISINOPRIL 10 MG PO TABS
ORAL_TABLET | Freq: Every day | ORAL | 0 refills | Status: DC
  Filled 2021-06-03: qty 90, 90d supply, fill #0

## 2021-06-03 MED ORDER — MONTELUKAST SODIUM 10 MG PO TABS
ORAL_TABLET | Freq: Every day | ORAL | 0 refills | Status: DC
Start: 2021-06-03 — End: 2021-08-11
  Filled 2021-06-03: qty 30, 30d supply, fill #0
  Filled 2021-07-04: qty 30, 30d supply, fill #1
  Filled 2021-07-31: qty 30, 30d supply, fill #2

## 2021-06-03 MED FILL — Duloxetine HCl Enteric Coated Pellets Cap 30 MG (Base Eq): ORAL | 30 days supply | Qty: 90 | Fill #0 | Status: AC

## 2021-06-03 NOTE — BH Specialist Note (Signed)
Integrated Behavioral Health Follow Up Telephone Visit  MRN: [id number] Name: Renee Bush   Total time: 60 minutes  Types of Service: Telephone visit   Interpretor:No. Interpretor Name and Language: N/A  Subjective: Renee Bush is a 60 y.o. female accompanied by  herself Patient was referred by Carlyon Shadow, NP for mental health. Patient reports the following symptoms/concerns: The patient reported that she is doing the same since her last follow--up appointment. She explained that she is still struggling with increased depression and anxiety symptoms since her mother in law passed away. She noted that she is sleeping better and is averaging 7 hours of sleep per night, falling asleep quickly, and waking only to use the restroom a couple of times per night. She noted that she wakes up feeling drained and is exhausted throughout her day. Renee Bush shared that she cries frequently and has been arguing with her husband more than usual. She shared that she is isolating herself in her bedroom more recently. [name] shared that they will finish cleaning out her mother in laws home this weekend and her husband is putting it up for sale on Monday. She reported that she is feeling upset about removing a plot that was on the wall of her mother in laws home and feels that Saturday will be difficult for her. She shared that she is hiding her emotions because she feels she needs to be the strong one for others to lean on but is struggling with not crying in front of others. The patient denied any suicidal or homicidal thoughts. Duration of problem: Yes; Severity of problem: moderate  Objective: Mood: Euthymic and Affect: Appropriate Risk of harm to self or others: No plan to harm self or others  Life Context: Family and Social: see above School/Work: see above Self-Care: see above Life Changes: see above  Patient and/or Family's Strengths/Protective Factors: Concrete supports in  place (healthy food, safe environments, etc.)  Goals Addressed: Patient will:  Reduce symptoms of: agitation, anxiety, depression, and mood instability   Increase knowledge and/or ability of: coping skills, healthy habits, self-management skills, and stress reduction   Demonstrate ability to: Increase healthy adjustment to current life circumstances  Progress towards Goals: Ongoing  Interventions: Interventions utilized:  CBT Cognitive Behavioral Therapy was utilized by the clinician during today's follow up session. The clinician processed with the patient how they have been doing since the last follow-up session. The clinician provided a space for the patient to ventilate their frustrations regarding their current life circumstances. Clinician met with patient to identify needs related to stressors and functioning, and assess and monitor for signs and symptoms of anxiety and depression, and assess safety.  Clinician measured the patient's anxiety and depression on a numerical scale. Clinician messaged the patient's provider regarding her medication concerns. Clinician encouraged the client to recognize that feelings can be uncomfortable and unpleasant but not dangerous that by willingness to experience them fully while aligning actions and behaviors to meet her values is a step towards healthy grieving but by suppressing her feelings she is draining her energy which makes her grief harder to cope with. Clinician encouraged the client to practice mindfulness and self care.  Standardized Assessments completed: GAD-7 and PHQ 9 GAD-7 =  18 PHQ-9 =   15  Assessment: Patient currently experiencing see above.   Patient may benefit from see above.  Plan: Follow up with behavioral health clinician on : 06/09/2021 at 12:00 PM  Behavioral recommendations:  Referral(s): [facility] (In Clinic) "From scale of 1-10, how likely are you to follow plan?":   [name],  LCSWA

## 2021-06-04 ENCOUNTER — Other Ambulatory Visit: Payer: Self-pay

## 2021-06-09 ENCOUNTER — Ambulatory Visit: Payer: Self-pay | Admitting: Licensed Clinical Social Worker

## 2021-06-10 ENCOUNTER — Ambulatory Visit: Payer: Self-pay | Admitting: Licensed Clinical Social Worker

## 2021-06-10 ENCOUNTER — Other Ambulatory Visit: Payer: Self-pay

## 2021-06-10 DIAGNOSIS — F411 Generalized anxiety disorder: Secondary | ICD-10-CM

## 2021-06-10 DIAGNOSIS — F331 Major depressive disorder, recurrent, moderate: Secondary | ICD-10-CM

## 2021-06-10 NOTE — BH Specialist Note (Signed)
Integrated Behavioral Health Follow Up Telephone Visit  MRN: [id number] Name: Renee Bush   Total time: 30 minutes  Types of Service: Telephone visit Patient consents to telephone visit and 2 patient identifiers were used to identify patient   Interpretor:No. Interpretor Name and Language: N/A  Subjective: Renee Bush is a 60 y.o. female accompanied by  herself  Patient was referred by Renee Shadow, NP for Mental Health. Patient reports the following symptoms/concerns: The patient reports that she has been doing worse since her last follow-up appointment. She shared that she has not spoken to her husband in three days. She stated that she has been feeling very depressed and irritable and is unable to cope with any additional stress.She shared she is unable to control her temper and is easily upset by the slightest things. She shared she is isolating in her bedroom most of the time she is at home Renee Bush shared that she went to her hair salon appointment and got her hair colored for the first time in her life. She noted that she had lunch with her daughter, but felt she was not heard when she tried to discuss her feelings. The patient reported that she has intense crying spells throughout the day and into the night and feels it is not always over the loss of her mother-in- law. Renee Bush feels it is everything in her life that is bringing her down. She stated that she needs medication to help her get through her day because she can not take it anymore and requests the clinician to ask the doctor for medications. Renee Bush denied any suicidal or homicidal thoughts.  Duration of problem: Years; Severity of problem: moderate  Objective: Mood: Euthymic and Affect: Appropriate Risk of harm to self or others: No plan to harm self or others  Life Context: Family and Social: see above School/Work: see above Self-Care: see above Life Changes: see above  Patient and/or Family's  Strengths/Protective Factors: Concrete supports in place (healthy food, safe environments, etc.)  Goals Addressed: Patient will:  Reduce symptoms of: agitation, anxiety, depression, and stress   Increase knowledge and/or ability of: coping skills, healthy habits, self-management skills, and stress reduction   Demonstrate ability to: Increase healthy adjustment to current life circumstances and Increase adequate support systems for patient/family  Progress towards Goals: Ongoing  Interventions: Interventions utilized:  CBT Cognitive Behavioral Therapy was utilized by the clinician during today's follow up session. Clinician met with patient to identify needs related to stressors and functioning, and assess and monitor for signs and symptoms of anxiety and depression, and assess safety. The clinician processed with the patient how they have been doing since the last follow-up session. Clinician introduced distress tolerance skills to the patient and explained that negative emotions will usually lessen in intensity and pass over time. Clinician agreed to relay the patient's request for medication during the psychiatric case consultation meeting on 06/10/2021 at 11:00 AM.   Standardized Assessments completed: GAD-7 and PHQ 9 GAD-7 =  18 PHQ-9 =  15  Assessment: Patient currently experiencing see above.   Patient may benefit from see above.  Plan: Follow up with behavioral health clinician on : 06/16/2021 at 6:00 PM  Behavioral recommendations:  Referral(s): [facility] (In Clinic) "From scale of 1-10, how likely are you to follow plan?":   Renee Bush, LCSWA

## 2021-06-11 ENCOUNTER — Other Ambulatory Visit: Payer: Self-pay

## 2021-06-11 ENCOUNTER — Other Ambulatory Visit: Payer: Self-pay | Admitting: Gerontology

## 2021-06-11 DIAGNOSIS — E1140 Type 2 diabetes mellitus with diabetic neuropathy, unspecified: Secondary | ICD-10-CM

## 2021-06-14 ENCOUNTER — Other Ambulatory Visit: Payer: Self-pay | Admitting: Gerontology

## 2021-06-14 DIAGNOSIS — E1140 Type 2 diabetes mellitus with diabetic neuropathy, unspecified: Secondary | ICD-10-CM

## 2021-06-15 ENCOUNTER — Other Ambulatory Visit: Payer: Self-pay

## 2021-06-15 ENCOUNTER — Other Ambulatory Visit: Payer: Self-pay | Admitting: Gerontology

## 2021-06-15 DIAGNOSIS — E1140 Type 2 diabetes mellitus with diabetic neuropathy, unspecified: Secondary | ICD-10-CM

## 2021-06-15 DIAGNOSIS — F3341 Major depressive disorder, recurrent, in partial remission: Secondary | ICD-10-CM

## 2021-06-15 DIAGNOSIS — F411 Generalized anxiety disorder: Secondary | ICD-10-CM

## 2021-06-15 MED ORDER — CANAGLIFLOZIN 100 MG PO TABS
ORAL_TABLET | ORAL | 0 refills | Status: DC
  Filled 2021-06-15: qty 90, 90d supply, fill #0

## 2021-06-15 MED ORDER — HYDROXYZINE HCL 25 MG PO TABS
25.0000 mg | ORAL_TABLET | Freq: Two times a day (BID) | ORAL | 0 refills | Status: DC | PRN
Start: 2021-06-15 — End: 2021-07-06
  Filled 2021-06-15: qty 60, 30d supply, fill #0

## 2021-06-15 MED ORDER — DULOXETINE HCL 30 MG PO CPEP
90.0000 mg | ORAL_CAPSULE | Freq: Every day | ORAL | 0 refills | Status: DC
Start: 2021-06-15 — End: 2021-07-06
  Filled 2021-06-15 – 2021-06-30 (×2): qty 90, 30d supply, fill #0

## 2021-06-15 MED ORDER — DULOXETINE HCL 60 MG PO CPEP
60.0000 mg | ORAL_CAPSULE | Freq: Every day | ORAL | 0 refills | Status: DC
  Filled 2021-06-15: qty 30, 30d supply, fill #0

## 2021-06-15 MED FILL — Ferrous Sulfate Tab 325 MG (65 MG Elemental Fe): ORAL | 90 days supply | Qty: 90 | Fill #0 | Status: AC

## 2021-06-15 MED FILL — Gabapentin Cap 400 MG: ORAL | Qty: 120 | Fill #0 | Status: CN

## 2021-06-16 ENCOUNTER — Ambulatory Visit: Payer: Self-pay | Admitting: Licensed Clinical Social Worker

## 2021-06-17 ENCOUNTER — Encounter: Payer: Self-pay | Admitting: Gerontology

## 2021-06-17 ENCOUNTER — Ambulatory Visit: Payer: Self-pay | Admitting: Gerontology

## 2021-06-17 ENCOUNTER — Other Ambulatory Visit: Payer: Self-pay

## 2021-06-17 VITALS — BP 105/70 | HR 88 | Temp 97.9°F | Ht 62.0 in | Wt 164.9 lb

## 2021-06-17 DIAGNOSIS — F4321 Adjustment disorder with depressed mood: Secondary | ICD-10-CM

## 2021-06-17 DIAGNOSIS — R42 Dizziness and giddiness: Secondary | ICD-10-CM

## 2021-06-17 DIAGNOSIS — M25511 Pain in right shoulder: Secondary | ICD-10-CM

## 2021-06-17 DIAGNOSIS — I10 Essential (primary) hypertension: Secondary | ICD-10-CM

## 2021-06-17 DIAGNOSIS — E1140 Type 2 diabetes mellitus with diabetic neuropathy, unspecified: Secondary | ICD-10-CM

## 2021-06-17 DIAGNOSIS — G8929 Other chronic pain: Secondary | ICD-10-CM

## 2021-06-17 DIAGNOSIS — Z794 Long term (current) use of insulin: Secondary | ICD-10-CM

## 2021-06-17 LAB — GLUCOSE, POCT (MANUAL RESULT ENTRY): POC Glucose: 102 mg/dl — AB (ref 70–99)

## 2021-06-17 MED ORDER — METOPROLOL TARTRATE 50 MG PO TABS
50.0000 mg | ORAL_TABLET | Freq: Two times a day (BID) | ORAL | 0 refills | Status: DC
  Filled 2021-06-17 – 2021-07-12 (×2): qty 60, 30d supply, fill #0

## 2021-06-17 MED ORDER — LIDOCAINE 5 % EX PTCH
1.0000 | MEDICATED_PATCH | CUTANEOUS | 0 refills | Status: DC
  Filled 2021-06-17: qty 30, 30d supply, fill #0

## 2021-06-17 NOTE — Progress Notes (Signed)
Established Patient Office Visit  Subjective:  Patient ID: Renee Bush, female    DOB: Sep 19, 1960  Age: 60 y.o. MRN: [id number]  CC:  Chief Complaint  Patient presents with   Dizziness    Pt reports dizziness and 'walking like I'm drunk'. Pt reports this is going on for several months. Reports nausea and sharp pains in her head.   Shoulder Pain    Pt reports recurring shoulder pain from August. It had gotten better but is hurting again. Started bc she was taking her of her mother, getting her in and out of bed.    HPI Renee Bush  is a 60 year old female with a history of  h/o T2DM, HTN, hyperlipidemia  who presents for evaluation of dizziness and chronic shoulder pain. She feels dizzy when she stands up or gets out of bed, sometimes when she has been standing for a while. She has not fallen, but often feels like she could. She asks if she should walk with a cane. She has been experiencing significant grief and isolation since her mother-in-law passed away 04-May-2023. She is tearful throughout the visit today. She meets regularly with Nira Conn at [facility] and speaks almost nightly with her Hospice counselor. She has not been eating well, usually only once or twice a day, and sometimes not eating at all. Yesterday she reports only eating an 8-count chick fil a nugget meal and a banana sandwich, stating she had to force herself to eat. She might drink 2-3 glasses of tea or 4-5 small bottles of water in total throughout the day. She has not yet eaten today and has only had 1 glass of tea with splenda. Her blood glucose at the visit this afternoon is 102 mg/dL. She is checking her blood pressure and glucose less frequently at home, stating the numbers are "good, lower than normal." She started the hydroxyzine yesterday, is taking it first thing in the morning "to get through the day" and again in the evening. She spends most of the day in her bedroom going to bed at 8pm. She takes  melatonin, trazodone, and gabapentin at 8pm. She continues to experience non radiating pain to her right shoulder which started 2 months ago.  She describes it as aching, rates at 6/10 at rest but it goes up to 10/10  with activity such as picking up anything. She has not taking any medication except Prednisone that was prescribed at her last visit.  She feels her vision is worse and she reports a headache above her left eye. The headache feels like an intermittent sharp pain that has been there for a few months. Her vision is blurry even with her glasses. She went to [facility] for a vision screen and could not see the letters on the chart. She returns to them for further evaluation on 06/22/21. Overall, she states that she's doing the best she can do, and offers no further complaint.   Past Medical History:  Diagnosis Date   Chronic kidney disease, stage III (moderate) ([place]) 10/06/2017   Diabetes mellitus without complication (HCC)    GERD (gastroesophageal reflux disease)    Hypercholesteremia    Hypertension 09/10/2015    Past Surgical History:  Procedure Laterality Date   APPENDECTOMY     CESAREAN SECTION  1991   COLONOSCOPY WITH PROPOFOL N/A 06/03/2019   Procedure: COLONOSCOPY WITH PROPOFOL;  Surgeon: Jonathon Bellows, MD;  Location: [facility];  Service: Gastroenterology;  Laterality: N/A;  Family History  Problem Relation Age of Onset   COPD Mother    Alzheimer's disease Mother    Hypertension Father    Hyperlipidemia Father    Heart attack Father    Diabetes type II Father    Diabetes type II Sister    Diabetes type II Sister    Gestational diabetes Daughter    Diabetes type II Sister     Social History   Socioeconomic History   Marital status: Married    Spouse name: Not on file   Number of children: 4   Years of education: Not on file   Highest education level: 11th grade  Occupational History   Occupation: na  Tobacco Use   Smoking status: Former    Types:  Cigarettes    Quit date: 04/17/2005    Years since quitting: 16.1   Smokeless tobacco: Never  Vaping Use   Vaping Use: Never used  Substance and Sexual Activity   Alcohol use: Not Currently    Alcohol/week: 14.0 standard drinks    Types: 14 Cans of beer per week    Comment: quit ~2020   Drug use: No   Sexual activity: Not on file  Other Topics Concern   Not on file  Social History Narrative   Lives in mobile home paid for   Social Determinants of Health   Financial Resource Strain: Not on file  Food Insecurity: No Food Insecurity   Worried About Charity fundraiser in the Last Year: Never true   [place] in the Last Year: Never true  Transportation Needs: No Transportation Needs   Lack of Transportation (Medical): No   Lack of Transportation (Non-Medical): No  Physical Activity: Not on file  Stress: Not on file  Social Connections: Not on file  Intimate Partner Violence: Not on file    Outpatient Medications Prior to Visit  Medication Sig Dispense Refill   aspirin EC 81 MG tablet Take 1 tablet (81 mg total) by mouth daily.     cetirizine (ZYRTEC) 10 MG tablet TAKE ONE TABLET BY MOUTH EVERY DAY 90 tablet 1   cyanocobalamin (,VITAMIN B-12,) 1000 MCG/ML injection INJECT 1 ML INTO THE MUSCLE EVERY 30 DAYS 1 mL 12   DULoxetine (CYMBALTA) 30 MG capsule Take 3 capsules (90 mg total) by mouth once daily. 90 capsule 0   ferrous sulfate (FEROSUL) 325 (65 FE) MG tablet TAKE ONE TABLET BY MOUTH EVERY DAY WITH BREAKFAST. 90 tablet 1   gabapentin (NEURONTIN) 400 MG capsule TAKE ONE CAPSULE BY MOUTH 2 TIMES A DAY AND TAKE 2 CAPSULES BY MOUTH AT BEDTIME 120 capsule 0   hydrOXYzine (ATARAX/VISTARIL) 25 MG tablet Take 1 tablet (25 mg total) by mouth 2 (two) times daily as needed. 60 tablet 0   Insulin Glargine (BASAGLAR KWIKPEN) 100 UNIT/ML INJECT 17 UNITS UNDER THE SKIN ONCE DAILY AT BEDTIME. 15 mL 4   lisinopril (ZESTRIL) 10 MG tablet TAKE ONE TABLET BY MOUTH EVERY DAY 90 tablet 0    Melatonin 10 MG TABS Take 1 tablet (10 mg) by mouth once daily at bedtime as needed. 30 tablet 2   metFORMIN (GLUCOPHAGE) 500 MG tablet TAKE ONE TABLET BY MOUTH 2 TIMES A DAY 180 tablet 0   montelukast (SINGULAIR) 10 MG tablet TAKE ONE TABLET BY MOUTH AT ONCE DAILY AT BEDTIME. 90 tablet 0   Omega-3 Fatty Acids (FISH OIL) 1000 MG CAPS Take 1,000 mg by mouth 2 (two) times daily.  omeprazole (PRILOSEC) 20 MG capsule TAKE ONE CAPSULE BY MOUTH 2 TIMES A DAY BEFORE MEALS 60 capsule 3   rosuvastatin (CRESTOR) 5 MG tablet TAKE ONE TABLET BY MOUTH ONCE DAILY 60 tablet 2   traZODone (DESYREL) 100 MG tablet Take 1 tablet (100 mg total) by mouth once daily at bedtime. 30 tablet 0   canagliflozin (INVOKANA) 100 MG TABS tablet TAKE ONE TABLET BY MOUTH EVERY DAY BEFORE BREAKFAST 90 tablet 0   metoprolol tartrate (LOPRESSOR) 50 MG tablet TAKE 1 1/2 TABLETS ($RemoveBefo'75MG'fVbNAOVOyhr$ ) BY MOUTH TWICE DAILY. 90 tablet 1   Blood Glucose Monitoring Suppl (RIGHTEST GM550 BLOOD GLUCOSE) w/Device KIT AS DIRECTED 1 kit 0   diclofenac Sodium (VOLTAREN) 1 % GEL Apply 2 g topically to the affected area(s) 4 (four) times daily. (Patient not taking: Reported on 06/17/2021) 350 g 0   Dulaglutide 3 MG/0.5ML SOPN INJECT $RemoveBef'3MG'qBeeyRwewS$  INTO THE SKIN ONCE A WEEK AS DIRECTED 16 mL 3   glucose blood test strip USE AS DIRECTED 100 strip 99   Insulin Pen Needle 32G X 6 MM MISC Use as directed. 100 each PRN   nitroGLYCERIN (NITROSTAT) 0.4 MG SL tablet DISSOLVE 1 TABLET UNDER TONGUE AS NEEDED FOR CHEST PAIN EVERY 5 MINUTES FOR MAX OF 3 DOSES IN 15 MINUTES. IF NO RELIEF AFTER FIRST DOSE CALL 911. (Patient not taking: Reported on 06/17/2021) 25 tablet 0   Rightest GL300 Lancets MISC USE AS DIRECTED 100 each 99   predniSONE (DELTASONE) 10 MG tablet Take 6 tablets by mouth once daily with breakfast on Day 1. Take 5 tabs once on Day 2. Take 4 tabs once on Day 3. Take 3 tabs once on Day 4. Take 2 tabs once on Day 5. Take 1 tab once on Day 6. Then take (1/2) tab once on Day  7. (Patient not taking: Reported on 06/17/2021) 22 tablet 0   No facility-administered medications prior to visit.    Allergies  Allergen Reactions   Etodolac Rash   Penicillin G Rash   Penicillins Rash    Has patient had a PCN reaction causing immediate rash, facial/tongue/throat swelling, SOB or lightheadedness with hypotension: Yes Has patient had a PCN reaction causing severe rash involving mucus membranes or skin necrosis: No Has patient had a PCN reaction that required hospitalization: No Has patient had a PCN reaction occurring within the last 10 years: No If all of the above answers are "NO", then may proceed with Cephalosporin use.    ROS Review of Systems  Constitutional:  Positive for activity change, appetite change and fatigue.  HENT:  Negative for congestion, sinus pressure, sinus pain and trouble swallowing.   Eyes:  Positive for visual disturbance. Negative for discharge and itching.  Respiratory:  Negative for chest tightness, shortness of breath and wheezing.   Cardiovascular:  Negative for chest pain, palpitations and leg swelling.  Gastrointestinal:  Negative for abdominal pain, diarrhea and vomiting.  Endocrine: Negative for polydipsia, polyphagia and polyuria.  Genitourinary:  Negative for dysuria, flank pain and pelvic pain.  Musculoskeletal:  Positive for myalgias. Negative for neck pain and neck stiffness.  Skin: Negative.   Neurological:  Positive for dizziness and headaches.  Hematological:  Negative for adenopathy.  Psychiatric/Behavioral:  Positive for dysphoric mood.      Objective:    Physical Exam Constitutional:      General: She is not in acute distress.    Appearance: Normal appearance. She is normal weight. She is not ill-appearing.  HENT:  Head: Normocephalic and atraumatic.     Mouth/Throat:     Mouth: Mucous membranes are moist. Mucous membranes are pale. No oral lesions.     Dentition: Dental caries present.  Eyes:      Extraocular Movements: Extraocular movements intact.     Conjunctiva/sclera: Conjunctivae normal.     Pupils: Pupils are equal, round, and reactive to light.  Cardiovascular:     Rate and Rhythm: Normal rate and regular rhythm.     Pulses: Normal pulses.     Heart sounds: Normal heart sounds. No murmur heard. Pulmonary:     Effort: Pulmonary effort is normal.     Breath sounds: Normal breath sounds. No wheezing or rhonchi.  Abdominal:     General: Abdomen is flat. Bowel sounds are increased.     Palpations: Abdomen is soft.     Tenderness: There is no abdominal tenderness.  Musculoskeletal:     Right shoulder: Tenderness (tender at top of shoulder blade to neck) present. No swelling or deformity. Normal strength.       Arms:     Cervical back: Normal range of motion.  Skin:    Capillary Refill: Capillary refill takes less than 2 seconds.     Findings: No bruising.  Neurological:     General: No focal deficit present.     Mental Status: She is alert and oriented to person, place, and time.     Sensory: No sensory deficit.  Psychiatric:        Mood and Affect: Mood is depressed. Affect is tearful.        Speech: Speech normal.        Behavior: Behavior is cooperative.        Thought Content: Thought content does not include suicidal ideation. Thought content does not include suicidal plan.        Cognition and Memory: Cognition and memory normal.    BP 105/70 (BP Location: Right Arm, Patient Position: Sitting, Cuff Size: Normal)   Pulse 88   Temp 97.9 F (36.6 C)   Ht $R'5\' 2"'mM$  (1.575 m)   Wt 164 lb 14.4 oz (74.8 kg)   SpO2 98%   BMI 30.16 kg/m  Wt Readings from Last 3 Encounters:  06/17/21 164 lb 14.4 oz (74.8 kg)  05/11/21 165 lb 6.4 oz (75 kg)  05/05/21 164 lb 11.2 oz (74.7 kg)     Health Maintenance Due  Topic Date Due   HIV Screening  Never done   Hepatitis C Screening  Never done   Zoster Vaccines- Shingrix (1 of 2) Never done   COVID-19 Vaccine (3 - Pfizer risk  series) 01/20/2020   Pneumococcal Vaccine 4-52 Years old (2 - PCV) 05/15/2020   OPHTHALMOLOGY EXAM  03/07/2021    There are no preventive care reminders to display for this patient.  Lab Results  Component Value Date   TSH 0.763 11/09/2017   Lab Results  Component Value Date   WBC 5.9 03/10/2021   HGB 12.7 03/10/2021   HCT 39.7 03/10/2021   MCV 95 03/10/2021   PLT 181 03/10/2021   Lab Results  Component Value Date   NA 143 05/05/2021   K 4.9 05/05/2021   CO2 24 05/05/2021   GLUCOSE 134 (H) 05/05/2021   BUN 15 05/05/2021   CREATININE 1.17 (H) 05/05/2021   BILITOT 0.2 03/10/2021   ALKPHOS 58 03/10/2021   AST 14 03/10/2021   ALT 13 03/10/2021   PROT 5.8 (L) 03/10/2021   ALBUMIN  4.1 03/10/2021   CALCIUM 9.3 05/05/2021   ANIONGAP 12 08/10/2019   EGFR 53 (L) 05/05/2021   Lab Results  Component Value Date   CHOL 149 03/10/2021   Lab Results  Component Value Date   HDL 36 (L) 03/10/2021   Lab Results  Component Value Date   LDLCALC 82 03/10/2021   Lab Results  Component Value Date   TRIG 182 (H) 03/10/2021   Lab Results  Component Value Date   CHOLHDL 4.1 03/10/2021   Lab Results  Component Value Date   HGBA1C 7.0 (H) 03/10/2021      Assessment & Plan:   1. Hypertension, unspecified type - Blood pressure lower than her normal. Will decrease metoprolol to 50 mg twice daily, ask her to track her BP at home and bring the log back into clinic. Reminded and encouraged her to eat and drink throughout the day. - metoprolol tartrate (LOPRESSOR) 50 MG tablet; Take 1 tablet (50 mg total) by mouth 2 (two) times daily.  Dispense: 60 tablet; Refill: 0  2. Chronic right shoulder pain - Normal range of motion and strength. No numbness or tingling. Will try lidocaine patch for relief. - lidocaine (LIDODERM) 5 %; Place 1 patch onto the skin once daily. Remove and discard patch within 12 hours or as directed by doctor.  Dispense: 30 patch; Refill: 0  3. Grief -  Encouraged her to start with small changes in her day - brush her teeth daily, eat small meals and drink water throughout the day. Continue to work with Nira Conn at [facility] and Hospice.   4. Dizzinesses - Encouraged adequate fluid and food intake. Reminded her to change positions slowly. Advised her to not drive and to go to the emergency room if her symptoms get worse.  5. Type 2 diabetes mellitus with diabetic neuropathy, with long-term current use of insulin ([place]) - Invokana  was discontinued due to reduced blood sugars, reduced oral intake, CKD. Track blood glucose and bring log to follow up appointment.   Follow-up: Return in about 2 weeks (around 07/01/2021), or if symptoms worsen or fail to improve.   Harvin Hazel, RN

## 2021-06-17 NOTE — Patient Instructions (Signed)
Complicated Grief Grief is a normal response to the death of someone close to you. Feelings of fear, anger, and guilt can affect almost everyone who loses a loved one. It is also common to have symptoms of depression while you are grieving. These include problems with sleep, loss of appetite, and lack of energy. They may last for weeks or months after a loss. Complicated grief is different from normal grief or depression. Normal grieving involves sadness and feelings of loss, but those feelings get better and heal over time. Complicated grief is a severe type of grief that lasts for a long time, usually for several months to a year or longer. It interferes with your ability to function normally. Complicated grief may require treatment from a mental health care provider. What are the causes? The cause of this condition is not known. It is not clear why some people continue to struggle with grief and others do not. What increases the risk? You are more likely to develop this condition if: The death of your loved one was sudden or unexpected. The death of your loved one was due to a violent event. Your loved one died from suicide. Your loved one was a child or a young person. You were very close to your loved one, or you were dependent on him or her. You have a history of depression or anxiety. What are the signs or symptoms? Symptoms of this condition include: Feeling disbelief or having a lack of emotion (numbness). Being unable to enjoy good memories of your loved one. Needing to avoid anything or anyone that reminds you of your loved one. Being unable to stop thinking about the death. Feeling intense anger or guilt. Feeling alone and hopeless. Feeling that your life is meaningless and empty. Losing the desire to move on with your life. How is this diagnosed? This condition may be diagnosed based on: Your symptoms. Complicated grief will be diagnosed if you have ongoing symptoms of grief for  6-12 months or longer. The effect of symptoms on your life. You may be diagnosed with this condition if your symptoms are interfering with your ability to live your life. Your health care provider may recommend that you see a mental health care provider. Many symptoms of depression are similar to the symptoms of complicated grief. It is important to be evaluated for complicated grief along with other mental health conditions. How is this treated? This condition is most commonly treated with talk therapy. This therapy is offered by a mental health specialist (psychiatrist). During therapy: You will learn healthy ways to cope with the loss of your loved one. Your mental health care provider may recommend antidepressant medicines. Follow these instructions at home: Lifestyle  Take care of yourself. Eat on a regular basis, and maintain a healthy diet. Eat plenty of fruits, vegetables, lean protein, and whole grains. Try to get some exercise each day. Aim for 30 minutes of exercise on most days of the week. Keep a consistent sleep schedule. Try to get 8 or more hours of sleep each night. Start doing the things that you used to enjoy. Do not use drugs or alcohol to ease your symptoms. Spend time with friends and loved ones. General instructions Take over-the-counter and prescription medicines only as told by your health care provider. Consider joining a grief (bereavement) support group to help you deal with your loss. Keep all follow-up visits as told by your health care provider. This is important. Contact a health care provider if:  Your symptoms prevent you from functioning normally. Your symptoms do not get better with treatment. Get help right away if: You have serious thoughts about hurting yourself or someone else. You have suicidal feelings. If you ever feel like you may hurt yourself or others, or have thoughts about taking your own life, get help right away. You can go to your nearest  emergency department or call: Your local emergency services (911 in the [place] A suicide crisis helpline, such as the National Suicide Prevention Lifeline at (480) 420-8369. This is open 24 hours a day. Summary Complicated grief is a severe type of grief that lasts for a long time. This grief is not likely to go away on its own. Get the help you need. Some griefs are more difficult than others and can cause this condition. You may need a certain type of treatment to help you recover if the loss of your loved one was sudden, violent, or due to suicide. You may feel guilty about moving on with your life. Getting help does not mean that you are forgetting your loved one. It means that you are taking care of yourself. Complicated grief is best treated with talk therapy. Medicines may also be prescribed. Seek the help you need, and find support that will help you recover. This information is not intended to replace advice given to you by your health care provider. Make sure you discuss any questions you have with your health care provider. Document Revised: 02/06/2020 Document Reviewed: 02/06/2020 Elsevier Patient Education  2022 [employer] Dizziness Dizziness is a common problem. It is a feeling of unsteadiness or light-headedness. You may feel like you are about to faint. Dizziness can lead to injury if you stumble or fall. Anyone can become dizzy, but dizziness is more common in older adults. This condition can be caused by a number of things, including medicines, dehydration, or illness. Follow these instructions at home: Eating and drinking  Drink enough fluid to keep your urine pale yellow. This helps to keep you from becoming dehydrated. Try to drink more clear fluids, such as water. Do not drink alcohol. Limit your caffeine intake if told to do so by your health care provider. Check ingredients and nutrition facts to see if a food or beverage contains caffeine. Limit your salt (sodium)  intake if told to do so by your health care provider. Check ingredients and nutrition facts to see if a food or beverage contains sodium. Activity  Avoid making quick movements. Rise slowly from chairs and steady yourself until you feel okay. In the morning, first sit up on the side of the bed. When you feel okay, stand slowly while you hold onto something until you know that your balance is good. If you need to stand in one place for a long time, move your legs often. Tighten and relax the muscles in your legs while you are standing. Do not drive or use machinery if you feel dizzy. Avoid bending down if you feel dizzy. Place items in your home so that they are easy for you to reach without leaning over. Lifestyle Do not use any products that contain nicotine or tobacco. These products include cigarettes, chewing tobacco, and vaping devices, such as e-cigarettes. If you need help quitting, ask your health care provider. Try to reduce your stress level by using methods such as yoga or meditation. Talk with your health care provider if you need help to manage your stress. General instructions Watch your dizziness for any changes.  Take over-the-counter and prescription medicines only as told by your health care provider. Talk with your health care provider if you think that your dizziness is caused by a medicine that you are taking. Tell a friend or a family member that you are feeling dizzy. If he or she notices any changes in your behavior, have this person call your health care provider. Keep all follow-up visits. This is important. Contact a health care provider if: Your dizziness does not go away or you have new symptoms. Your dizziness or light-headedness gets worse. You feel nauseous. You have reduced hearing. You have a fever. You have neck pain or a stiff neck. Your dizziness leads to an injury or a fall. Get help right away if: You vomit or have diarrhea and are unable to eat or  drink anything. You have problems talking, walking, swallowing, or using your arms, hands, or legs. You feel generally weak. You have any bleeding. You are not thinking clearly or you have trouble forming sentences. It may take a friend or family member to notice this. You have chest pain, abdominal pain, shortness of breath, or sweating. Your vision changes or you develop a severe headache. These symptoms may represent a serious problem that is an emergency. Do not wait to see if the symptoms will go away. Get medical help right away. Call your local emergency services (911 in the [place] Do not drive yourself to the hospital. Summary Dizziness is a feeling of unsteadiness or light-headedness. This condition can be caused by a number of things, including medicines, dehydration, or illness. Anyone can become dizzy, but dizziness is more common in older adults. Drink enough fluid to keep your urine pale yellow. Do not drink alcohol. Avoid making quick movements if you feel dizzy. Monitor your dizziness for any changes. This information is not intended to replace advice given to you by your health care provider. Make sure you discuss any questions you have with your health care provider. Document Revised: 07/20/2020 Document Reviewed: 07/20/2020 Elsevier Patient Education  2022 [employer].

## 2021-06-21 ENCOUNTER — Other Ambulatory Visit: Payer: Self-pay | Admitting: Gerontology

## 2021-06-21 DIAGNOSIS — E1140 Type 2 diabetes mellitus with diabetic neuropathy, unspecified: Secondary | ICD-10-CM

## 2021-06-21 MED FILL — Gabapentin Cap 400 MG: ORAL | 30 days supply | Qty: 120 | Fill #0 | Status: AC

## 2021-06-22 ENCOUNTER — Other Ambulatory Visit: Payer: Self-pay

## 2021-06-22 MED ORDER — METFORMIN HCL 500 MG PO TABS
ORAL_TABLET | Freq: Two times a day (BID) | ORAL | 0 refills | Status: DC
  Filled 2021-06-22: qty 180, 90d supply, fill #0

## 2021-06-24 ENCOUNTER — Other Ambulatory Visit: Payer: Self-pay

## 2021-06-24 ENCOUNTER — Ambulatory Visit: Payer: Self-pay | Admitting: Licensed Clinical Social Worker

## 2021-06-24 DIAGNOSIS — F331 Major depressive disorder, recurrent, moderate: Secondary | ICD-10-CM

## 2021-06-24 DIAGNOSIS — F411 Generalized anxiety disorder: Secondary | ICD-10-CM

## 2021-06-24 NOTE — BH Specialist Note (Signed)
Integrated Behavioral Health Follow Up Telephone Visit  MRN: [id number] Name: Renee Bush   Total time: 60 minutes  Types of Service: Telephone visit Patient consents to telephone visit and 2 patient identifiers were used to identify patient   Interpretor:No. Interpretor Name and Language: N/A  Subjective: Renee Bush is a 60 y.o. female accompanied by  herself Patient was [facility] by [facility]. Patient reports the following symptoms/concerns: The patient reports that she feels that she is doing a bit worse since her last follow-up session. She noted that her relationship with her husband has worsened and they are arguing more. She stated she stays in her bedroom so that she does not have to engage with him. She noted that she continues to grieve over the loss of her mother in law. She noted that she is not able to eat much and does not have an appetite. She explained that she visited her primary care provide at the [facility] for her shoulder pain, and broke down crying. Dorthea discussed other family stressors. 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 She shared that she continues to talk to a Hospice counselor and feels that helps her to cope. Libbey shared that she recently spent time with her grandchildren and her daughter. She noted that she plans to have dinner with some friends next week.The patient asked how long it took for hydroxyzine to start working. The patient denied any suicidal or homicidal thoughts. Duration of problem: Years; Severity of problem: moderate  Objective: Mood: Euthymic and Affect: Appropriate Risk of harm to self or others: No plan to harm self or others  Life Context: Family and Social: see above School/Work: see above Self-Care: see above Life Changes: see above  Patient and/or Family's Strengths/Protective Factors: Concrete supports in place (healthy food, safe environments,  etc.)  Goals Addressed: Patient will:  Reduce symptoms of: agitation, anxiety, depression, insomnia, and stress   Increase knowledge and/or ability of: coping skills, healthy habits, self-management skills, and stress reduction   Demonstrate ability to: Increase healthy adjustment to current life circumstances and Begin healthy grieving over loss  Progress towards Goals: Ongoing  Interventions: Interventions utilized:  CBT Cognitive Behavioral Therapywas utilized by the clinician during today's follow up session. Clinician met with patient to identify needs related to stressors and functioning, and assess and monitor for signs and symptoms of anxiety and depression, and assess safety. The clinician processed with the patient how they have been doing since the last follow-up session. Clinician discussed building 'living memories' with the patient to help her recognize the quality, importance and irreplaceable impact of her mother- in- law 55 life and to build awareness of the impact and meaning this has for the present. Clinician explained that distressing emotions will fade in intensity over time, and to use her coping skills to get through the difficult moments. Clinician encouraged the patient to start a journal and record her frustrations and emotions as a healthy outlet. Clinician congratulated the patient on implementing self care and encouraged her to keep up the good work.  Standardized Assessments completed: GAD-7 and PHQ 9 GAD-7 =   18 PHQ-9 =   19  Assessment: Patient currently experiencing see above.   Patient may benefit from see above.  Plan: Follow up with behavioral health clinician on : 07/01/2021 at 10:00 AM  Behavioral recommendations:  Referral(s): [place] (In Clinic) "From scale of 1-10, how likely are you  to follow plan?":   [name], LCSWA

## 2021-06-28 ENCOUNTER — Other Ambulatory Visit: Payer: Self-pay

## 2021-06-29 ENCOUNTER — Other Ambulatory Visit: Payer: Self-pay

## 2021-06-30 ENCOUNTER — Other Ambulatory Visit: Payer: Self-pay

## 2021-07-01 ENCOUNTER — Other Ambulatory Visit: Payer: Self-pay

## 2021-07-01 ENCOUNTER — Ambulatory Visit: Payer: Self-pay | Admitting: Licensed Clinical Social Worker

## 2021-07-01 DIAGNOSIS — F411 Generalized anxiety disorder: Secondary | ICD-10-CM

## 2021-07-01 DIAGNOSIS — F331 Major depressive disorder, recurrent, moderate: Secondary | ICD-10-CM

## 2021-07-01 NOTE — BH Specialist Note (Signed)
Integrated Behavioral Health Follow Up Telephone Visit  MRN: [id number] Name: Renee Bush   Total time: 60 minutes  Types of Service: Telephone visit Patient consents to telephone visit and 2 patient identifiers were used to identify patient  Interpretor:No. Interpretor Name and Language: N/A  Subjective: Renee Bush is a 60 y.o. female accompanied by  herself Patient was referred by Carlyon Shadow, NP for mental health. Patient reports the following symptoms/concerns: The patient reports that she has been doing ell since her last follow-up appointment. She stated that she is not arguing with her husband as much and when he pushes her buttons or she feels the urge to fight with him she walks away or lets it go instead. She shared that she feels like it just is not worth fussing and spending her energy fighting with him. She discussed other family stressors impacting her life. She noted that other overall mood has improved. She reported hat she is taking her psychotropic medication as prescribed, and requested the clinician message her provider for refills. The patient noted that she was invited to spend Thanksgiving with her niece and nephew. She explained that she has not felt like cooking yet since the passing of her mother in law. She shared that she is practicing self care daily. She stated she is exercising and has bought a stationary bike that she uses while watching TV.  The patient reports that she is hopeful the worst has past. Fawne denied any suicidal or homicidal thoughts. Duration of problem: Years; Severity of problem: moderate  Objective: Mood: Euthymic and Affect: Appropriate Risk of harm to self or others: No plan to harm self or others  Life Context: Family and Social: see above School/Work: see above Self-Care: see above Life Changes: see above  Patient and/or Family's Strengths/Protective Factors: Concrete supports in place (healthy food, safe  environments, etc.)  Goals Addressed: Patient will:  Reduce symptoms of: agitation, anxiety, depression, insomnia, mood instability, and stress   Increase knowledge and/or ability of: coping skills, healthy habits, self-management skills, and stress reduction   Demonstrate ability to: Increase healthy adjustment to current life circumstances and Increase adequate support systems for patient/family  Progress towards Goals: Ongoing  Interventions: Interventions utilized:  CBT Cognitive Behavioral Therapy was utilized by the clinician during today's follow up session. Clinician met with patient to identify needs related to stressors and functioning, and assess and monitor for signs and symptoms of anxiety and depression, and assess safety. The clinician processed with the patient how they have been doing since the last follow-up session.   The clinician encouraged the patient to utilize their coping skills to deal with their current life circumstances. Clinician congratulated the patient on doing so well and her progress with self care and prioritizing her needs. Clinician discussed that having a plan for the holidays will help with those feelings of loss and being mindful that her mother in law lives on in the traditions she passed down, her recipes still on the table, and in the lives of her family. Clinician collaborated with the patient to normalized her grieving process. Session ended in scheduling.  Standardized Assessments completed: GAD-7 and PHQ 9 GAD-7= 6 PHQ-9= 6  Assessment: Patient currently experiencing see above.   Patient may benefit from see above.  Plan: Follow up with behavioral health clinician on : 07/14/2021 at 3:00 PM  Behavioral recommendations:  Referral(s): [place] (In Clinic) "From scale of 1-10, how likely are you to follow plan?":  [name], LCSWA

## 2021-07-04 ENCOUNTER — Other Ambulatory Visit: Payer: Self-pay | Admitting: Gerontology

## 2021-07-04 DIAGNOSIS — F3341 Major depressive disorder, recurrent, in partial remission: Secondary | ICD-10-CM

## 2021-07-04 MED FILL — Cyanocobalamin Inj 1000 MCG/ML: INTRAMUSCULAR | 30 days supply | Qty: 1 | Fill #6 | Status: AC

## 2021-07-05 ENCOUNTER — Other Ambulatory Visit: Payer: Self-pay

## 2021-07-06 ENCOUNTER — Other Ambulatory Visit: Payer: Self-pay

## 2021-07-06 ENCOUNTER — Other Ambulatory Visit: Payer: Self-pay | Admitting: Gerontology

## 2021-07-06 DIAGNOSIS — F411 Generalized anxiety disorder: Secondary | ICD-10-CM

## 2021-07-06 DIAGNOSIS — F3341 Major depressive disorder, recurrent, in partial remission: Secondary | ICD-10-CM

## 2021-07-06 MED ORDER — DULOXETINE HCL 30 MG PO CPEP
90.0000 mg | ORAL_CAPSULE | Freq: Every day | ORAL | 2 refills | Status: DC
  Filled 2021-07-06 – 2021-07-31 (×2): qty 90, 30d supply, fill #0
  Filled 2021-08-20 – 2021-09-03 (×2): qty 90, 30d supply, fill #1
  Filled 2021-10-05: qty 90, 30d supply, fill #2

## 2021-07-06 MED ORDER — HYDROXYZINE HCL 25 MG PO TABS
25.0000 mg | ORAL_TABLET | Freq: Two times a day (BID) | ORAL | 2 refills | Status: DC | PRN
  Filled 2021-07-06 – 2021-07-31 (×2): qty 60, 30d supply, fill #0
  Filled 2021-09-03: qty 60, 30d supply, fill #1
  Filled 2021-10-05: qty 60, 30d supply, fill #2

## 2021-07-06 MED ORDER — TRAZODONE HCL 100 MG PO TABS
100.0000 mg | ORAL_TABLET | Freq: Every day | ORAL | 2 refills | Status: DC
  Filled 2021-07-06 – 2021-07-31 (×2): qty 30, 30d supply, fill #0
  Filled 2021-09-03: qty 30, 30d supply, fill #1
  Filled 2021-10-05: qty 30, 30d supply, fill #2

## 2021-07-06 MED ORDER — TRAZODONE HCL 100 MG PO TABS
100.0000 mg | ORAL_TABLET | Freq: Every day | ORAL | 0 refills | Status: DC
  Filled 2021-07-06: qty 30, 30d supply, fill #0

## 2021-07-12 ENCOUNTER — Other Ambulatory Visit: Payer: Self-pay

## 2021-07-13 ENCOUNTER — Encounter: Payer: Self-pay | Admitting: Gerontology

## 2021-07-13 ENCOUNTER — Ambulatory Visit: Payer: Self-pay | Admitting: Gerontology

## 2021-07-13 ENCOUNTER — Other Ambulatory Visit: Payer: Self-pay

## 2021-07-13 VITALS — BP 125/78 | HR 86 | Temp 98.1°F | Resp 16 | Ht 62.0 in | Wt 163.7 lb

## 2021-07-13 DIAGNOSIS — K5900 Constipation, unspecified: Secondary | ICD-10-CM | POA: Insufficient documentation

## 2021-07-13 DIAGNOSIS — E538 Deficiency of other specified B group vitamins: Secondary | ICD-10-CM

## 2021-07-13 DIAGNOSIS — E7800 Pure hypercholesterolemia, unspecified: Secondary | ICD-10-CM

## 2021-07-13 DIAGNOSIS — K219 Gastro-esophageal reflux disease without esophagitis: Secondary | ICD-10-CM

## 2021-07-13 DIAGNOSIS — R42 Dizziness and giddiness: Secondary | ICD-10-CM

## 2021-07-13 DIAGNOSIS — I10 Essential (primary) hypertension: Secondary | ICD-10-CM

## 2021-07-13 DIAGNOSIS — E1140 Type 2 diabetes mellitus with diabetic neuropathy, unspecified: Secondary | ICD-10-CM

## 2021-07-13 DIAGNOSIS — N1831 Chronic kidney disease, stage 3a: Secondary | ICD-10-CM

## 2021-07-13 HISTORY — DX: Constipation, unspecified: K59.00

## 2021-07-13 LAB — POCT GLYCOSYLATED HEMOGLOBIN (HGB A1C): Hemoglobin A1C: 6.8 % — AB (ref 4.0–5.6)

## 2021-07-13 LAB — GLUCOSE, POCT (MANUAL RESULT ENTRY): POC Glucose: 112 mg/dl — AB (ref 70–99)

## 2021-07-13 MED ORDER — OMEPRAZOLE 20 MG PO CPDR
DELAYED_RELEASE_CAPSULE | ORAL | 3 refills | Status: DC
  Filled 2021-07-13: qty 60, 30d supply, fill #0
  Filled 2021-08-14: qty 60, 30d supply, fill #1
  Filled 2021-09-16: qty 60, 30d supply, fill #2
  Filled 2021-10-21: qty 60, 30d supply, fill #3

## 2021-07-13 MED ORDER — ROSUVASTATIN CALCIUM 5 MG PO TABS
ORAL_TABLET | ORAL | 2 refills | Status: DC
  Filled 2021-07-13: qty 60, fill #0
  Filled 2021-07-31: qty 30, 30d supply, fill #0
  Filled 2021-09-03: qty 30, 30d supply, fill #1
  Filled 2021-10-05: qty 30, 30d supply, fill #2

## 2021-07-13 MED ORDER — METFORMIN HCL 500 MG PO TABS
ORAL_TABLET | Freq: Two times a day (BID) | ORAL | 0 refills | Status: DC
  Filled 2021-07-13: qty 180, fill #0
  Filled 2021-09-16: qty 180, 90d supply, fill #0

## 2021-07-13 MED ORDER — SENNOSIDES-DOCUSATE SODIUM 8.6-50 MG PO TABS
1.0000 | ORAL_TABLET | Freq: Every day | ORAL | 3 refills | Status: DC
  Filled 2021-07-13 – 2021-07-31 (×3): qty 30, 30d supply, fill #0

## 2021-07-13 MED ORDER — METOPROLOL TARTRATE 50 MG PO TABS
50.0000 mg | ORAL_TABLET | Freq: Two times a day (BID) | ORAL | 2 refills | Status: DC
  Filled 2021-07-13: qty 60, 30d supply, fill #0

## 2021-07-13 MED ORDER — GABAPENTIN 400 MG PO CAPS
ORAL_CAPSULE | ORAL | 0 refills | Status: DC
  Filled 2021-07-13: qty 120, fill #0
  Filled 2021-07-20: qty 120, 30d supply, fill #0

## 2021-07-13 MED ORDER — CYANOCOBALAMIN 1000 MCG/ML IJ SOLN: 1000.0000 ug | Freq: Once | INTRAMUSCULAR | Status: DC

## 2021-07-13 MED ORDER — LISINOPRIL 10 MG PO TABS
ORAL_TABLET | Freq: Every day | ORAL | 0 refills | Status: DC
  Filled 2021-07-13: qty 90, fill #0
  Filled 2021-09-03 (×2): qty 90, 90d supply, fill #0

## 2021-07-13 NOTE — Patient Instructions (Signed)
Diabetes Mellitus and Exercise Exercising regularly is important for overall health, especially for people who have diabetes mellitus. Exercising is not only about losing weight. It has many other health benefits, such as increasing muscle strength and bone density and reducing body fat and stress. This leads to improved fitness, flexibility, and endurance, all of which result in better overall health. What are the benefits of exercise if I have diabetes? Exercise has many benefits for people with diabetes. They include: Helping to lower and control blood sugar (glucose). Helping the body to respond better to the hormone insulin by improving insulin sensitivity. Reducing how much insulin the body needs. Lowering the risk for heart disease by: Lowering "bad" cholesterol and triglyceride levels. Increasing "good" cholesterol levels. Lowering blood pressure. Lowering blood glucose levels. What is my activity plan? Your health care provider or certified diabetes educator can help you make a plan for the type and frequency of exercise that works for you. This is called your activity plan. Be sure to: Get at least 150 minutes of medium-intensity or high-intensity exercise each week. Exercises may include brisk walking, biking, or water aerobics. Do stretching and strengthening exercises, such as yoga or weight lifting, at least 2 times a week. Spread out your activity over at least 3 days of the week. Get some form of physical activity each day. Do not go more than 2 days in a row without some kind of physical activity. Avoid being inactive for more than 90 minutes at a time. Take frequent breaks to walk or stretch. Choose exercises or activities that you enjoy. Set realistic goals. Start slowly and gradually increase your exercise intensity over time. How do I manage my diabetes during exercise? Monitor your blood glucose Check your blood glucose before and after exercising. If your blood glucose  is: 240 mg/dL (13.3 mmol/L) or higher before you exercise, check your urine for ketones. These are chemicals created by the liver. If you have ketones in your urine, do not exercise until your blood glucose returns to normal. 100 mg/dL (5.6 mmol/L) or lower, eat a snack containing 15-20 grams of carbohydrate. Check your blood glucose 15 minutes after the snack to make sure that your glucose level is above 100 mg/dL (5.6 mmol/L) before you start your exercise. Know the symptoms of low blood glucose (hypoglycemia) and how to treat it. Your risk for hypoglycemia increases during and after exercise. Follow these tips and your health care provider's instructions Keep a carbohydrate snack that is fast-acting for use before, during, and after exercise to help prevent or treat hypoglycemia. Avoid injecting insulin into areas of the body that are going to be exercised. For example, avoid injecting insulin into: Your arms, when you are about to play tennis. Your legs, when you are about to go jogging. Keep records of your exercise habits. Doing this can help you and your health care provider adjust your diabetes management plan as needed. Write down: Food that you eat before and after you exercise. Blood glucose levels before and after you exercise. The type and amount of exercise you have done. Work with your health care provider when you start a new exercise or activity. He or she may need to: Make sure that the activity is safe for you. Adjust your insulin, other medicines, and food that you eat. Drink plenty of water while you exercise. This prevents loss of water (dehydration) and problems caused by a lot of heat in the body (heat stroke). Where to find more information   American Diabetes Association: www.diabetes.org Summary Exercising regularly is important for overall health, especially for people who have diabetes mellitus. Exercising has many health benefits. It increases muscle strength and bone  density and reduces body fat and stress. It also lowers and controls blood glucose. Your health care provider or certified diabetes educator can help you make an activity plan for the type and frequency of exercise that works for you. Work with your health care provider to make sure any new activity is safe for you. Also work with your health care provider to adjust your insulin, other medicines, and the food you eat. This information is not intended to replace advice given to you by your health care provider. Make sure you discuss any questions you have with your health care provider.  Constipation, Adult Constipation is when a person has fewer than three bowel movements in a week, has difficulty having a bowel movement, or has stools (feces) that are dry, hard, or larger than normal. Constipation may be caused by an underlying condition. It may become worse with age if a person takes certain medicines and does not take in enough fluids. Follow these instructions at home: Eating and drinking  Eat foods that have a lot of fiber, such as beans, whole grains, and fresh fruits and vegetables. Limit foods that are low in fiber and high in fat and processed sugars, such as fried or sweet foods. These include french fries, hamburgers, cookies, candies, and soda. Drink enough fluid to keep your urine pale yellow. General instructions Exercise regularly or as told by your health care provider. Try to do 150 minutes of moderate exercise each week. Use the bathroom when you have the urge to go. Do not hold it in. Take over-the-counter and prescription medicines only as told by your health care provider. This includes any fiber supplements. During bowel movements: Practice deep breathing while relaxing the lower abdomen. Practice pelvic floor relaxation. Watch your condition for any changes. Let your health care provider know about them. Keep all follow-up visits as told by your health care provider. This  is important. Contact a health care provider if: You have pain that gets worse. You have a fever. You do not have a bowel movement after 4 days. You vomit. You are not hungry or you lose weight. You are bleeding from the opening between the buttocks (anus). You have thin, pencil-like stools. Get help right away if: You have a fever and your symptoms suddenly get worse. You leak stool or have blood in your stool. Your abdomen is bloated. You have severe pain in your abdomen. You feel dizzy or you faint. Summary Constipation is when a person has fewer than three bowel movements in a week, has difficulty having a bowel movement, or has stools (feces) that are dry, hard, or larger than normal. Eat foods that have a lot of fiber, such as beans, whole grains, and fresh fruits and vegetables. Drink enough fluid to keep your urine pale yellow. Take over-the-counter and prescription medicines only as told by your health care provider. This includes any fiber supplements. This information is not intended to replace advice given to you by your health care provider. Make sure you discuss any questions you have with your health care provider. Document Revised: 07/03/2019 Document Reviewed: 07/03/2019 Elsevier Patient Education  2022 [employer]  Document Revised: 05/13/2019 Document Reviewed: 05/13/2019 Elsevier Patient Education  2022 [employer].

## 2021-07-13 NOTE — Progress Notes (Signed)
Established Patient Office Visit  Subjective:  Patient ID: Renee Bush, female    DOB: [birth year]  Age: 60 y.o. MRN: [id number]  CC:  Chief Complaint  Patient presents with   Follow-up    Patient here for a follow up of dizziness from 06/17/21 visit. Patient states she is somewhat better.   B12 Injection   Hypertension    Follow up after change in HTN med dose    HPI Renee Bush with a history of T2DM, HTN, hyperlipidemia who presents to follow up on her dizziness and medication changes. She reports that overall she is feeling much better. Her episodes of dizziness has resolved. Her HgbA1c checked during visit decreased from 7% to 6.8%. She checks her fasting blood glucose daily, states that her readings ranges between 80-130 mg/dl.  His blood glucose was 112 mg/dl when checked during visit. She denies hypo/hyperglycemic symptoms, peripheral neuropathy is under control with taking gabapentin and she performs daily foot checks. She states that she's compliant with her medications, adheres to ADA diet and exercises as tolerated with her exercise bike. She checks her blood pressure at home, reports her readings are in the 409'W systolic and 11-91'Y diastolic. She is eating more frequently and staying hydrated by drinking water throughout the day. She reports the lidocaine patches are not effective with her right shoulder pain. She fell off a ladder on Saturday on 07/10/21, missing a step, and fell backwards hitting her right shoulder and elbow on the porch railing. She denies hitting her head or any loss of consciousness. She has slight bruising at her right elbow. Today her  intermittent non radiating shoulder pain is 5/10 and achy. She states that taking tylenol does not relieve her pain, but applying heating pad does relieved symptoms.  She states that her mood is better, though she still misses  her mother in law Renee Bush deeply, but is no longer crying daily. She is still taking  atarax twice daily, is still working with [facility] counselor Nira Conn, and she frequently speaks with a counselor from hospice. She states her sleep has improved and she is no longer waking up in the middle of the night. She is taking trazodone with 10 mg melatonin. She reports constipation for the last 4-5 months, denies hematochezia, no abdominal pain and doesn't strain when moving her bowel. She states that she is doing well and offers no further complaint.    Past Medical History:  Diagnosis Date   Chronic kidney disease, stage III (moderate) ([place]) 10/06/2017   Diabetes mellitus without complication (HCC)    GERD (gastroesophageal reflux disease)    Hypercholesteremia    Hypertension 09/10/2015    Past Surgical History:  Procedure Laterality Date   APPENDECTOMY     CESAREAN SECTION  1991   COLONOSCOPY WITH PROPOFOL N/A 06/03/2019   Procedure: COLONOSCOPY WITH PROPOFOL;  Surgeon: Jonathon Bellows, MD;  Location: [facility] ENDOSCOPY;  Service: Gastroenterology;  Laterality: N/A;    Family History  Problem Relation Age of Onset   COPD Mother    Alzheimer's disease Mother    Hypertension Father    Hyperlipidemia Father    Heart attack Father    Diabetes type II Father    Diabetes type II Sister    Diabetes type II Sister    Gestational diabetes Daughter    Diabetes type II Sister     Social History   Socioeconomic History   Marital status: Married    Spouse name: Not on file  Number of children: 4   Years of education: Not on file   Highest education level: 11th grade  Occupational History   Occupation: na  Tobacco Use   Smoking status: Former    Types: Cigarettes    Quit date: 04/17/2005    Years since quitting: 16.2   Smokeless tobacco: Never  Vaping Use   Vaping Use: Never used  Substance and Sexual Activity   Alcohol use: Not Currently    Alcohol/week: 14.0 standard drinks    Types: 14 Cans of beer per week    Comment: quit ~2020   Drug use: No   Sexual activity: Not on file   Other Topics Concern   Not on file  Social History Narrative   Lives in mobile home paid for   Social Determinants of Health   Financial Resource Strain: Not on file  Food Insecurity: No Food Insecurity   Worried About Charity fundraiser in the Last Year: Never true   [place] in the Last Year: Never true  Transportation Needs: No Transportation Needs   Lack of Transportation (Medical): No   Lack of Transportation (Non-Medical): No  Physical Activity: Not on file  Stress: Not on file  Social Connections: Not on file  Intimate Partner Violence: Not on file    Outpatient Medications Prior to Visit  Medication Sig Dispense Refill   aspirin EC 81 MG tablet Take 1 tablet (81 mg total) by mouth daily.     Blood Glucose Monitoring Suppl (RIGHTEST GM550 BLOOD GLUCOSE) w/Device KIT AS DIRECTED 1 kit 0   cetirizine (ZYRTEC) 10 MG tablet TAKE ONE TABLET BY MOUTH EVERY DAY 90 tablet 1   cyanocobalamin (,VITAMIN B-12,) 1000 MCG/ML injection INJECT 1 ML INTO THE MUSCLE EVERY 30 DAYS 1 mL 12   Dulaglutide 3 MG/0.5ML SOPN INJECT 3MG INTO THE SKIN ONCE A WEEK AS DIRECTED 16 mL 3   DULoxetine (CYMBALTA) 30 MG capsule Take 3 capsules (90 mg total) by mouth once daily. 90 capsule 2   ferrous sulfate (FEROSUL) 325 (65 FE) MG tablet TAKE ONE TABLET BY MOUTH EVERY DAY WITH BREAKFAST. 90 tablet 1   glucose blood test strip USE AS DIRECTED 100 strip 99   Insulin Glargine (BASAGLAR KWIKPEN) 100 UNIT/ML INJECT 17 UNITS UNDER THE SKIN ONCE DAILY AT BEDTIME. 15 mL 4   Insulin Pen Needle 32G X 6 MM MISC Use as directed. 100 each PRN   Melatonin 10 MG TABS Take 1 tablet by mouth at bedtime.     metoprolol tartrate (LOPRESSOR) 50 MG tablet Take 1 tablet (50 mg total) by mouth 2 (two) times daily. 60 tablet 0   montelukast (SINGULAIR) 10 MG tablet TAKE ONE TABLET BY MOUTH AT ONCE DAILY AT BEDTIME. 90 tablet 0   nitroGLYCERIN (NITROSTAT) 0.4 MG SL tablet DISSOLVE 1 TABLET UNDER TONGUE AS NEEDED FOR CHEST  PAIN EVERY 5 MINUTES FOR MAX OF 3 DOSES IN 15 MINUTES. IF NO RELIEF AFTER FIRST DOSE CALL 911. 25 tablet 0   Omega-3 Fatty Acids (FISH OIL) 1000 MG CAPS Take 1,000 mg by mouth 2 (two) times daily.     Rightest GL300 Lancets MISC USE AS DIRECTED 100 each 99   traZODone (DESYREL) 100 MG tablet Take 1 tablet (100 mg total) by mouth once daily at bedtime. 30 tablet 2   gabapentin (NEURONTIN) 400 MG capsule TAKE ONE CAPSULE BY MOUTH 2 TIMES A DAY AND TAKE 2 CAPSULES BY MOUTH AT BEDTIME 120 capsule 0  lisinopril (ZESTRIL) 10 MG tablet TAKE ONE TABLET BY MOUTH EVERY DAY 90 tablet 0   metFORMIN (GLUCOPHAGE) 500 MG tablet TAKE ONE TABLET BY MOUTH 2 TIMES A DAY 180 tablet 0   omeprazole (PRILOSEC) 20 MG capsule TAKE ONE CAPSULE BY MOUTH 2 TIMES A DAY BEFORE MEALS 60 capsule 3   rosuvastatin (CRESTOR) 5 MG tablet TAKE ONE TABLET BY MOUTH ONCE DAILY 60 tablet 2   hydrOXYzine (ATARAX/VISTARIL) 25 MG tablet Take 1 tablet (25 mg total) by mouth 2 (two) times daily as needed. 60 tablet 2   diclofenac Sodium (VOLTAREN) 1 % GEL Apply 2 g topically to the affected area(s) 4 (four) times daily. (Patient not taking: Reported on 06/17/2021) 350 g 0   lidocaine (LIDODERM) 5 % Place 1 patch onto the skin once daily. Remove and discard patch within 12 hours or as directed by doctor. 30 patch 0   traZODone (DESYREL) 100 MG tablet Take 1 tablet (100 mg total) by mouth once daily at bedtime. 30 tablet 0   No facility-administered medications prior to visit.    Allergies  Allergen Reactions   Etodolac Rash   Penicillin G Rash   Penicillins Rash    Has patient had a PCN reaction causing immediate rash, facial/tongue/throat swelling, SOB or lightheadedness with hypotension: Yes Has patient had a PCN reaction causing severe rash involving mucus membranes or skin necrosis: No Has patient had a PCN reaction that required hospitalization: No Has patient had a PCN reaction occurring within the last 10 years: No If all of the  above answers are "NO", then may proceed with Cephalosporin use.    ROS Review of Systems  Constitutional:  Negative for activity change, appetite change and fatigue.  Respiratory:  Negative for chest tightness and shortness of breath.   Cardiovascular:  Negative for chest pain, palpitations and leg swelling.  Endocrine: Negative for polydipsia, polyphagia and polyuria.  Musculoskeletal:  Positive for arthralgias. Negative for joint swelling, neck pain and neck stiffness.  Neurological:  Negative for dizziness, syncope, weakness, light-headedness and headaches.  Psychiatric/Behavioral:  Negative for sleep disturbance.      Objective:    Physical Exam Vitals and nursing note reviewed.  Constitutional:      Appearance: Normal appearance. She is normal weight.  HENT:     Head: Normocephalic and atraumatic.  Cardiovascular:     Rate and Rhythm: Normal rate and regular rhythm.     Pulses: Normal pulses.     Heart sounds: Normal heart sounds.  Pulmonary:     Effort: Pulmonary effort is normal.     Breath sounds: Normal breath sounds.  Abdominal:     General: Abdomen is flat. Bowel sounds are normal.     Palpations: Abdomen is soft.  Musculoskeletal:     Right shoulder: Tenderness present. No swelling, deformity, laceration or crepitus. Normal range of motion. Normal strength.     Right elbow: No deformity or effusion. Normal range of motion. Tenderness present.     Cervical back: Normal range of motion. No tenderness.     Right lower leg: No edema.     Left lower leg: No edema.  Lymphadenopathy:     Cervical: No cervical adenopathy.  Skin:    General: Skin is warm and dry.     Capillary Refill: Capillary refill takes less than 2 seconds.     Findings: Bruising (fading bruise at right elbow) present.  Neurological:     General: No focal deficit present.  Mental Status: She is alert and oriented to person, place, and time.    BP 125/78 (BP Location: Left Arm, Patient  Position: Sitting, Cuff Size: Normal)   Pulse 86   Temp 98.1 F (36.7 C) (Oral)   Resp 16   Ht _0  (1.575 m)   Wt 163 lb 11.2 oz (74.3 kg)   SpO2 98%   BMI 29.94 kg/m  Wt Readings from Last 3 Encounters:  07/13/21 163 lb 11.2 oz (74.3 kg)  06/17/21 164 lb 14.4 oz (74.8 kg)  05/11/21 165 lb 6.4 oz (75 kg)     Health Maintenance Due  Topic Date Due   HIV Screening  Never done   Hepatitis C Screening  Never done   Zoster Vaccines- Shingrix (1 of 2) Never done   COVID-19 Vaccine (3 - Pfizer risk series) 01/20/2020   Pneumococcal Vaccine 29-68 Years old (2 - PCV) 05/15/2020   OPHTHALMOLOGY EXAM  03/07/2021    There are no preventive care reminders to display for this patient.  Lab Results  Component Value Date   TSH 0.763 11/09/2017   Lab Results  Component Value Date   WBC 5.9 03/10/2021   HGB 12.7 03/10/2021   HCT 39.7 03/10/2021   MCV 95 03/10/2021   PLT 181 03/10/2021   Lab Results  Component Value Date   NA 143 05/05/2021   K 4.9 05/05/2021   CO2 24 05/05/2021   GLUCOSE 134 (H) 05/05/2021   BUN 15 05/05/2021   CREATININE 1.17 (H) 05/05/2021   BILITOT 0.2 03/10/2021   ALKPHOS 58 03/10/2021   AST 14 03/10/2021   ALT 13 03/10/2021   PROT 5.8 (L) 03/10/2021   ALBUMIN 4.1 03/10/2021   CALCIUM 9.3 05/05/2021   ANIONGAP 12 08/10/2019   EGFR 53 (L) 05/05/2021   Lab Results  Component Value Date   CHOL 149 03/10/2021   Lab Results  Component Value Date   HDL 36 (L) 03/10/2021   Lab Results  Component Value Date   LDLCALC 82 03/10/2021   Lab Results  Component Value Date   TRIG 182 (H) 03/10/2021   Lab Results  Component Value Date   CHOLHDL 4.1 03/10/2021   Lab Results  Component Value Date   HGBA1C 6.8 (A) 07/13/2021      Assessment & Plan:  1. B12 deficiency - She received her monthly B12 shot in clinic today. Will schedule future nurse visits for monthly injection.  2. Dizzinesses - resolved with reduction of metoprolol and  increased  fluid and food intake.  3. Stage 3a chronic kidney disease ([place]) - Will check labs for monitoring at next visit. - Basic Metabolic Panel (BMET); Future - Basic Metabolic Panel (BMET)  - 4. Type 2 diabetes mellitus with diabetic neuropathy, with long-term current use of insulin (HCC) - Hemoglobin A1C is 6.8% today. Refilled her metformin. She will continue the basaglar and trulicity as prescribed, low carb and non concentrated sweet diet. - POCT HgB A1C; Future - POCT Glucose (CBG); Future - POCT Glucose (CBG) - POCT HgB A1C - metFORMIN (GLUCOPHAGE) 500 MG tablet; TAKE ONE TABLET BY MOUTH 2 TIMES A DAY  Dispense: 180 tablet; Refill: 0 - gabapentin (NEURONTIN) 400 MG capsule; TAKE ONE CAPSULE BY MOUTH 2 TIMES A DAY AND TAKE 2 CAPSULES BY MOUTH AT BEDTIME  Dispense: 120 capsule; Refill: 0  5. Constipation, unspecified constipation type - Advised her to stay hydrated and to incorporate foods with soluble fiber.  - senna-docusate (SENOKOT-S) 8.6-50 MG  tablet; Take 1 tablet by mouth once daily.  Dispense: 30 tablet; Refill: 3  6. Gastroesophageal reflux disease without esophagitis - Well controlled on current medication. Sent refills.-Avoid spicy, fatty and fried food -Avoid sodas and sour juices -Avoid heavy meals -Avoid eating 4 hours before bedtime -Elevate head of bed at night - omeprazole (PRILOSEC) 20 MG capsule; TAKE ONE CAPSULE BY MOUTH 2 TIMES A DAY BEFORE MEALS  Dispense: 60 capsule; Refill: 3  7. Hypercholesteremia - Well controlled on crestor. Sent refills.. She was encouraged to continue on low fat/cholesterol diet.  - rosuvastatin (CRESTOR) 5 MG tablet; TAKE ONE TABLET BY MOUTH ONCE DAILY  Dispense: 60 tablet; Refill: 2  8. Hypertension, unspecified type - Blood pressure at goal on current regimen and dizziness has resolved. Sent refills. Advised her to continue exercising as tolerated, aiming for 150 minutes of moderate intensity exercise per week. She will  contact the clinic if she has questions, will go to the ER if she develops shortness of breath that does not resolve or chest pain.  - metoprolol tartrate (LOPRESSOR) 50 MG tablet; Take 1 tablet (50 mg total) by mouth 2 (two) times daily.  Dispense: 60 tablet; Refill: 2 - lisinopril (ZESTRIL) 10 MG tablet; TAKE ONE TABLET BY MOUTH EVERY DAY  Dispense: 90 tablet; Refill: 0    Follow-up: Return in about 3 months (around 10/13/2021), or if symptoms worsen or fail to improve.    Harvin Hazel, RN

## 2021-07-13 NOTE — Progress Notes (Signed)
Patient given Vitamin B12 10109mcg/ml in Right deltoid. WPV#[id number], exp 04/2022. Patient tolerated without complaint.

## 2021-07-14 ENCOUNTER — Ambulatory Visit: Payer: Self-pay | Admitting: Licensed Clinical Social Worker

## 2021-07-14 LAB — BASIC METABOLIC PANEL
BUN/Creatinine Ratio: 17 (ref 12–28)
BUN: 17 mg/dL (ref 8–27)
CO2: 24 mmol/L (ref 20–29)
Calcium: 9.3 mg/dL (ref 8.7–10.3)
Chloride: 104 mmol/L (ref 96–106)
Creatinine, Ser: 1.01 mg/dL — ABNORMAL HIGH (ref 0.57–1.00)
Glucose: 110 mg/dL — ABNORMAL HIGH (ref 70–99)
Potassium: 4.2 mmol/L (ref 3.5–5.2)
Sodium: 141 mmol/L (ref 134–144)
eGFR: 64 mL/min/{1.73_m2} (ref >59)

## 2021-07-20 ENCOUNTER — Other Ambulatory Visit: Payer: Self-pay

## 2021-07-21 ENCOUNTER — Other Ambulatory Visit: Payer: Self-pay

## 2021-07-27 ENCOUNTER — Ambulatory Visit: Payer: Self-pay | Admitting: Licensed Clinical Social Worker

## 2021-07-27 ENCOUNTER — Telehealth: Payer: Self-pay | Admitting: Licensed Clinical Social Worker

## 2021-07-27 NOTE — Telephone Encounter (Signed)
Called the patient twice during today's scheduled appointment; no answer, left a voicemail with the clinic contact information so they may reschedule.   

## 2021-07-29 ENCOUNTER — Other Ambulatory Visit: Payer: Self-pay

## 2021-07-29 ENCOUNTER — Ambulatory Visit: Payer: Self-pay | Admitting: Gerontology

## 2021-07-29 VITALS — BP 123/81 | HR 84 | Temp 97.8°F | Wt 166.1 lb

## 2021-07-29 DIAGNOSIS — M7989 Other specified soft tissue disorders: Secondary | ICD-10-CM

## 2021-07-29 NOTE — Progress Notes (Signed)
Established Patient Office Visit  Subjective:  Patient ID: Renee Bush, female    DOB: Feb 15, 1961  Age: 60 y.o. MRN: [id number]  CC:  Chief Complaint  Patient presents with   Leg Pain    Started 07/28/21     HPI Renee Bush is a 60 year old female with history of T2DM, HTN, HLD who presents for new swelling and pain in her left leg. She states she first noticed 5" round area of  swelling on the lateral aspect of her left mid-thigh on 07/28/21. She states it has not changed size. She states she has throbbing pain from this area that radiates down the front of her leg into the top of her foot. She rates it 8/10. She has been taking tylenol for pain which gives her some relief.  She denies any redness and feels it might be slightly warmer than the rest of her skin. She reports a family history of blood clots in her mother, but no personal history. She recalls no insect/animal bite or injury to the area. She reports no gait change. She now rides her exercise bike several times a week instead of twice daily every day. Other than her leg, she states she is well and has no further complaints.  Past Medical History:  Diagnosis Date   Chronic kidney disease, stage III (moderate) ([place]) 10/06/2017   Diabetes mellitus without complication (HCC)    GERD (gastroesophageal reflux disease)    Hypercholesteremia    Hypertension 09/10/2015    Past Surgical History:  Procedure Laterality Date   APPENDECTOMY     CESAREAN SECTION  1991   COLONOSCOPY WITH PROPOFOL N/A 06/03/2019   Procedure: COLONOSCOPY WITH PROPOFOL;  Surgeon: Jonathon Bellows, MD;  Location: [facility] ENDOSCOPY;  Service: Gastroenterology;  Laterality: N/A;    Family History  Problem Relation Age of Onset   COPD Mother    Alzheimer's disease Mother    Hypertension Father    Hyperlipidemia Father    Heart attack Father    Diabetes type II Father    Diabetes type II Sister    Diabetes type II Sister    Gestational diabetes  Daughter    Diabetes type II Sister     Social History   Socioeconomic History   Marital status: Married    Spouse name: Not on file   Number of children: 4   Years of education: Not on file   Highest education level: 11th grade  Occupational History   Occupation: na  Tobacco Use   Smoking status: Former    Types: Cigarettes    Quit date: 04/17/2005    Years since quitting: 16.2   Smokeless tobacco: Never  Vaping Use   Vaping Use: Never used  Substance and Sexual Activity   Alcohol use: Not Currently    Alcohol/week: 14.0 standard drinks    Types: 14 Cans of beer per week    Comment: quit ~2020   Drug use: No   Sexual activity: Not on file  Other Topics Concern   Not on file  Social History Narrative   Lives in mobile home paid for   Social Determinants of Health   Financial Resource Strain: Not on file  Food Insecurity: No Food Insecurity   Worried About Charity fundraiser in the Last Year: Never true   [place] in the Last Year: Never true  Transportation Needs: No Transportation Needs   Lack of Transportation (Medical): No   Lack  of Transportation (Non-Medical): No  Physical Activity: Not on file  Stress: Not on file  Social Connections: Not on file  Intimate Partner Violence: Not on file    Outpatient Medications Prior to Visit  Medication Sig Dispense Refill   aspirin EC 81 MG tablet Take 1 tablet (81 mg total) by mouth daily.     cetirizine (ZYRTEC) 10 MG tablet TAKE ONE TABLET BY MOUTH EVERY DAY 90 tablet 1   cyanocobalamin (,VITAMIN B-12,) 1000 MCG/ML injection INJECT 1 ML INTO THE MUSCLE EVERY 30 DAYS 1 mL 12   Dulaglutide 3 MG/0.5ML SOPN INJECT $RemoveBef'3MG'[id number]$  INTO THE SKIN ONCE A WEEK AS DIRECTED 16 mL 3   DULoxetine (CYMBALTA) 30 MG capsule Take 3 capsules (90 mg total) by mouth once daily. 90 capsule 2   ferrous sulfate (FEROSUL) 325 (65 FE) MG tablet TAKE ONE TABLET BY MOUTH EVERY DAY WITH BREAKFAST. 90 tablet 1   gabapentin (NEURONTIN) 400 MG capsule  TAKE ONE CAPSULE BY MOUTH 2 TIMES A DAY AND TAKE 2 CAPSULES BY MOUTH AT BEDTIME 120 capsule 0   hydrOXYzine (ATARAX/VISTARIL) 25 MG tablet Take 1 tablet (25 mg total) by mouth 2 (two) times daily as needed. 60 tablet 2   Insulin Pen Needle 32G X 6 MM MISC Use as directed. 100 each PRN   lisinopril (ZESTRIL) 10 MG tablet TAKE ONE TABLET BY MOUTH EVERY DAY 90 tablet 0   Melatonin 10 MG TABS Take 1 tablet by mouth at bedtime.     metFORMIN (GLUCOPHAGE) 500 MG tablet TAKE ONE TABLET BY MOUTH 2 TIMES A DAY 180 tablet 0   metoprolol tartrate (LOPRESSOR) 50 MG tablet Take 1 tablet (50 mg total) by mouth 2 (two) times daily. 60 tablet 0   montelukast (SINGULAIR) 10 MG tablet TAKE ONE TABLET BY MOUTH AT ONCE DAILY AT BEDTIME. 90 tablet 0   nitroGLYCERIN (NITROSTAT) 0.4 MG SL tablet DISSOLVE 1 TABLET UNDER TONGUE AS NEEDED FOR CHEST PAIN EVERY 5 MINUTES FOR MAX OF 3 DOSES IN 15 MINUTES. IF NO RELIEF AFTER FIRST DOSE CALL 911. 25 tablet 0   Omega-3 Fatty Acids (FISH OIL) 1000 MG CAPS Take 1,000 mg by mouth 2 (two) times daily.     omeprazole (PRILOSEC) 20 MG capsule TAKE ONE CAPSULE BY MOUTH 2 TIMES A DAY BEFORE MEALS 60 capsule 3   rosuvastatin (CRESTOR) 5 MG tablet TAKE ONE TABLET BY MOUTH ONCE DAILY 60 tablet 2   senna-docusate (SENOKOT-S) 8.6-50 MG tablet Take 1 tablet by mouth once daily. 30 tablet 3   traZODone (DESYREL) 100 MG tablet Take 1 tablet (100 mg total) by mouth once daily at bedtime. 30 tablet 2   Insulin Glargine (BASAGLAR KWIKPEN) 100 UNIT/ML INJECT 17 UNITS UNDER THE SKIN ONCE DAILY AT BEDTIME. 15 mL 4   metoprolol tartrate (LOPRESSOR) 50 MG tablet Take 1 tablet (50 mg total) by mouth 2 (two) times daily. 60 tablet 2   No facility-administered medications prior to visit.    Allergies  Allergen Reactions   Etodolac Rash   Penicillin G Rash   Penicillins Rash    Has patient had a PCN reaction causing immediate rash, facial/tongue/throat swelling, SOB or lightheadedness with  hypotension: Yes Has patient had a PCN reaction causing severe rash involving mucus membranes or skin necrosis: No Has patient had a PCN reaction that required hospitalization: No Has patient had a PCN reaction occurring within the last 10 years: No If all of the above answers are "NO", then may proceed  with Cephalosporin use.    ROS Review of Systems  Constitutional:  Negative for activity change, chills, fatigue and fever.  Respiratory:  Negative for chest tightness and shortness of breath.   Cardiovascular:  Positive for leg swelling. Negative for chest pain and palpitations.  Musculoskeletal:  Positive for myalgias. Negative for gait problem and joint swelling.  Skin:  Negative for color change and wound.  Hematological:  Negative for adenopathy.     Objective:    Physical Exam Constitutional:      Appearance: Normal appearance. She is normal weight.  Cardiovascular:     Rate and Rhythm: Normal rate and regular rhythm.     Pulses: Normal pulses.     Heart sounds: Normal heart sounds. No murmur heard.   No friction rub. No gallop.  Pulmonary:     Effort: Pulmonary effort is normal.     Breath sounds: Normal breath sounds. No stridor. No wheezing, rhonchi or rales.  Musculoskeletal:     Right lower leg: No edema.     Left lower leg: No edema.       Legs:     Comments: 5" area of swelling  Skin:    General: Skin is warm and dry.     Capillary Refill: Capillary refill takes less than 2 seconds.     Findings: No bruising or erythema.  Neurological:     Mental Status: She is alert and oriented to person, place, and time.    BP 123/81   Pulse 84   Temp 97.8 F (36.6 C) (Oral)   Wt 166 lb 1.6 oz (75.3 kg)   SpO2 98%   BMI 30.38 kg/m  Wt Readings from Last 3 Encounters:  07/29/21 166 lb 1.6 oz (75.3 kg)  07/13/21 163 lb 11.2 oz (74.3 kg)  06/17/21 164 lb 14.4 oz (74.8 kg)     Health Maintenance Due  Topic Date Due   HIV Screening  Never done   Hepatitis C  Screening  Never done   Zoster Vaccines- Shingrix (1 of 2) Never done   COVID-19 Vaccine (3 - Pfizer risk series) 01/20/2020   Pneumococcal Vaccine 60-68 Years old (2 - PCV) 05/15/2020   OPHTHALMOLOGY EXAM  03/07/2021   FOOT EXAM  07/14/2021    There are no preventive care reminders to display for this patient.  Lab Results  Component Value Date   TSH 0.763 11/09/2017   Lab Results  Component Value Date   WBC 5.9 03/10/2021   HGB 12.7 03/10/2021   HCT 39.7 03/10/2021   MCV 95 03/10/2021   PLT 181 03/10/2021   Lab Results  Component Value Date   NA 141 07/13/2021   K 4.2 07/13/2021   CO2 24 07/13/2021   GLUCOSE 110 (H) 07/13/2021   BUN 17 07/13/2021   CREATININE 1.01 (H) 07/13/2021   BILITOT 0.2 03/10/2021   ALKPHOS 58 03/10/2021   AST 14 03/10/2021   ALT 13 03/10/2021   PROT 5.8 (L) 03/10/2021   ALBUMIN 4.1 03/10/2021   CALCIUM 9.3 07/13/2021   ANIONGAP 12 08/10/2019   EGFR 64 07/13/2021   Lab Results  Component Value Date   CHOL 149 03/10/2021   Lab Results  Component Value Date   HDL 36 (L) 03/10/2021   Lab Results  Component Value Date   LDLCALC 82 03/10/2021   Lab Results  Component Value Date   TRIG 182 (H) 03/10/2021   Lab Results  Component Value Date   CHOLHDL 4.1 03/10/2021  Lab Results  Component Value Date   HGBA1C 6.8 (A) 07/13/2021      Assessment & Plan:   1. Swelling of thigh - Edematous area 5" round without visible discoloration, warmth, bruising or visible injury. Pedal pulse intact, no decreased strength or sensation. Negative Homan's sign.  - Advised patient to rest, elevate, apply ice or topical pain relief per directions on the bottle. She is advised to go to the ED if this worsens, or if she develops shortness of breath, chest tightness, chest pain, or any stroke-like symptoms.  - Otherwise, keep her appointment as previously scheduled.  Follow-up: Return if symptoms worsen or fail to improve.    Harvin Hazel, RN

## 2021-07-31 MED FILL — Cyanocobalamin Inj 1000 MCG/ML: INTRAMUSCULAR | 30 days supply | Qty: 1 | Fill #7 | Status: AC

## 2021-08-01 ENCOUNTER — Other Ambulatory Visit: Payer: Self-pay | Admitting: Gerontology

## 2021-08-01 DIAGNOSIS — I10 Essential (primary) hypertension: Secondary | ICD-10-CM

## 2021-08-01 MED ORDER — METOPROLOL TARTRATE 50 MG PO TABS
50.0000 mg | ORAL_TABLET | Freq: Two times a day (BID) | ORAL | 1 refills | Status: DC
  Filled 2021-08-01 – 2021-08-14 (×2): qty 60, 30d supply, fill #0
  Filled 2021-09-16: qty 60, 30d supply, fill #1

## 2021-08-02 ENCOUNTER — Other Ambulatory Visit: Payer: Self-pay

## 2021-08-03 ENCOUNTER — Ambulatory Visit: Payer: Self-pay | Admitting: Licensed Clinical Social Worker

## 2021-08-03 ENCOUNTER — Other Ambulatory Visit: Payer: Self-pay

## 2021-08-03 DIAGNOSIS — F331 Major depressive disorder, recurrent, moderate: Secondary | ICD-10-CM

## 2021-08-03 DIAGNOSIS — F411 Generalized anxiety disorder: Secondary | ICD-10-CM

## 2021-08-03 NOTE — BH Specialist Note (Signed)
Integrated Behavioral Health Follow Up In-Person Visit  MRN: [id number] Name: Renee Bush  Total time: 60 minutes  Types of Service: Telephone visit Patient consents to telephone visit and 2 patient identifiers were used to identify patient   Interpretor:No. Interpretor Name and Language: N/A  Subjective: Renee Bush is a 60 y.o. female accompanied by  herself  Patient was referred by Carlyon Shadow, NP  for Mental Health. Patient reports the following symptoms/concerns: The patient reports that she has been doing well since her last follow-up appointment. She shared that she went to her nephews home for Thanksgiving this year and how things have changed this year. She explained that they usually host dinner for their family, but with the recent loss of her mother in law she thought that would be too difficult for her. She shared that things did not feel the same without her loved on e there this year and she was glad it was over. She explained that they have been invited back for Christmas Eve and plan on attending again. Tinika shared that she went and had her hair done for self care this week. She shared that she had it dyed purple and will try burgundy next. The patient also noted that she is looking forward to going to the beach with her friends for New Years this year. The patient denied homicidal thoughts. Breeonna endorsed passive suicidal thoughts during Thanksgiving; see below.  Duration of problem: Years ; Severity of problem: moderate  Objective: Mood: Euthymic and Affect: Appropriate Risk of harm to self or others: Suicidal ideation. The patient reported that she had thoughts of ending her life on Thanksgiving. She stated she did not have a plan or access to means to carry out a plan. Maryclaire said she would not kill herself because of her grandchildren and her late mother-in-law. Anjulie stated, "Rod Holler would never have wanted me to end my life." The patient  said she agreed that if these thoughts worsened, she would call 911, [phone], go to an [place] crisis walk-in or go to the closest emergency department.   Life Context: Family and Social: see above School/Work: see above Self-Care: see above Life Changes: see above  Patient and/or Family's Strengths/Protective Factors: Concrete supports in place (healthy food, safe environments, etc.) and Sense of purpose  Goals Addressed: Patient will:  Reduce symptoms of: agitation, anxiety, depression, insomnia, and stress   Increase knowledge and/or ability of: coping skills, healthy habits, self-management skills, and stress reduction   Demonstrate ability to: Increase healthy adjustment to current life circumstances and Increase adequate support systems for patient/family  Progress towards Goals: Ongoing  Interventions: Interventions utilized:  Supportive Counselingwas utilized by the clinician during today's follow up session. Clinician met with patient to identify needs related to stressors and functioning, and assess and monitor for signs and symptoms of anxiety and depression, and assess safety. The clinician processed with the patient how they have been doing since the last follow-up session. Clinician measured the patient's depression and anxiety on a numerical scale.Clinician completed a risk assessment to determine level of severity when it comes to patient's suicidal thoughts. Clinician determined that the patient is competent, thinking clearly, orientated x 4, able to make own medical decisions, and is not acutely suicidal but if he were, she would access emergency crisis services. Clinician encouraged the client to continue to practice self care and use her coping skills to deal with her current life circumstances.Clinician provided the patient with instruction to call 911, [phone],  go to [place] crisis walk-in or go to the closest emergency department if her suicidal thoughts worsen or if she should experience  a mental health crisis. Clinician encouraged the patient by reviewing her recent progress in therapy. Session ended with scheduling.  Standardized Assessments completed: GAD-7 and PHQ 9 GAD-7 = 15 PHQ-9 =  7 Assessment: Patient currently experiencing see above.   Patient may benefit from see above.  Plan: Follow up with behavioral health clinician on : 09/02/2020 at 5:00 PM  Behavioral recommendations:  Referral(s): [facility] (In Clinic) "From scale of 1-10, how likely are you to follow plan?":   [name], LCSWA

## 2021-08-04 ENCOUNTER — Other Ambulatory Visit: Payer: Self-pay

## 2021-08-10 ENCOUNTER — Ambulatory Visit: Payer: Self-pay | Admitting: Adult Health

## 2021-08-11 ENCOUNTER — Other Ambulatory Visit: Payer: Self-pay

## 2021-08-11 ENCOUNTER — Other Ambulatory Visit: Payer: Self-pay | Admitting: Emergency Medicine

## 2021-08-11 ENCOUNTER — Ambulatory Visit: Payer: Self-pay | Admitting: Gerontology

## 2021-08-11 VITALS — BP 113/75 | HR 83 | Temp 98.0°F | Resp 16 | Ht 62.0 in | Wt 167.0 lb

## 2021-08-11 DIAGNOSIS — J0110 Acute frontal sinusitis, unspecified: Secondary | ICD-10-CM

## 2021-08-11 DIAGNOSIS — E1140 Type 2 diabetes mellitus with diabetic neuropathy, unspecified: Secondary | ICD-10-CM

## 2021-08-11 DIAGNOSIS — E538 Deficiency of other specified B group vitamins: Secondary | ICD-10-CM

## 2021-08-11 DIAGNOSIS — Z889 Allergy status to unspecified drugs, medicaments and biological substances status: Secondary | ICD-10-CM

## 2021-08-11 DIAGNOSIS — Z794 Long term (current) use of insulin: Secondary | ICD-10-CM

## 2021-08-11 MED ORDER — MONTELUKAST SODIUM 10 MG PO TABS
ORAL_TABLET | Freq: Every day | ORAL | 0 refills | Status: DC
  Filled 2021-08-11: qty 90, fill #0
  Filled 2021-09-16: qty 90, 90d supply, fill #0

## 2021-08-11 MED ORDER — GABAPENTIN 400 MG PO CAPS
ORAL_CAPSULE | ORAL | 0 refills | Status: DC
  Filled 2021-08-11 – 2021-08-14 (×2): qty 120, fill #0
  Filled 2021-08-20: qty 120, 30d supply, fill #0

## 2021-08-11 MED ORDER — CETIRIZINE HCL 10 MG PO TABS
ORAL_TABLET | Freq: Every day | ORAL | 1 refills | Status: DC
  Filled 2021-08-11: qty 90, fill #0
  Filled 2021-10-05: qty 30, 30d supply, fill #0
  Filled 2021-10-28: qty 90, 90d supply, fill #1
  Filled 2022-02-02: qty 90, 90d supply, fill #2
  Filled 2022-02-02: qty 60, 60d supply, fill #0

## 2021-08-11 NOTE — Progress Notes (Signed)
Patient here for monthly Vitamin B12 injection. B12 injection given IM left deltoid. RSW#[id number], exp 04/2022. Patient tolerated without complaint.

## 2021-08-16 ENCOUNTER — Other Ambulatory Visit: Payer: Self-pay

## 2021-08-18 ENCOUNTER — Telehealth: Payer: Self-pay | Admitting: Pharmacist

## 2021-08-18 NOTE — Telephone Encounter (Signed)
08/18/2021 3:25:47 PM - Call to [facility] on Cymbalta & Trulicity  -- [name] - Wednesday, August 18, 2021 3:22 PM --Laurell Josephs spoke with Renay-to check enrollment dates for meds:  He stated that Cymbalta patient enrolled till 06/09/2022 and patient is set up for AutoFill-per [facility] in their pharmacy next refill due 09/20/21. Trulicity-enrollment ended 06/08/21, since patient already enrolled all they need is either a script or page 5 of application, printed page 5 & getting Lanora Manis to sign, also per [facility] in their pharmacy when page 5 is received they will ship out to [place]. Next shipment due 08/29/21.

## 2021-08-20 ENCOUNTER — Other Ambulatory Visit: Payer: Self-pay

## 2021-09-02 ENCOUNTER — Ambulatory Visit: Payer: Self-pay | Admitting: Licensed Clinical Social Worker

## 2021-09-03 ENCOUNTER — Other Ambulatory Visit: Payer: Self-pay

## 2021-09-03 MED FILL — Cyanocobalamin Inj 1000 MCG/ML: INTRAMUSCULAR | 30 days supply | Qty: 1 | Fill #8 | Status: AC

## 2021-09-09 ENCOUNTER — Other Ambulatory Visit: Payer: Self-pay | Admitting: Gerontology

## 2021-09-09 ENCOUNTER — Other Ambulatory Visit: Payer: Self-pay

## 2021-09-09 DIAGNOSIS — E538 Deficiency of other specified B group vitamins: Secondary | ICD-10-CM

## 2021-09-09 MED ORDER — CYANOCOBALAMIN 1000 MCG/ML IJ SOLN: 1000.0000 ug | Freq: Once | INTRAMUSCULAR | Status: DC

## 2021-09-09 NOTE — Progress Notes (Signed)
Patient here for Vitamin B12 injection. 12:19pm Vitamin B12 1039mcg/ml injection given Right deltoid. Lot# [id number], exp 04/2022. NDC 7341-9379-02.

## 2021-09-14 ENCOUNTER — Other Ambulatory Visit: Payer: Self-pay

## 2021-09-14 ENCOUNTER — Ambulatory Visit: Payer: Self-pay | Admitting: Licensed Clinical Social Worker

## 2021-09-14 DIAGNOSIS — F339 Major depressive disorder, recurrent, unspecified: Secondary | ICD-10-CM

## 2021-09-14 DIAGNOSIS — F411 Generalized anxiety disorder: Secondary | ICD-10-CM

## 2021-09-14 NOTE — BH Specialist Note (Signed)
Integrated Behavioral Health via Telemedicine Visit  09/14/2021 LASHAE WOLLENBERG [id number]   Total time: 24  Referring Provider: Carlyon Shadow, NP Patient/Family location: The Patient's home address. [facility] Provider location: [facility]  All persons participating in visit: Zakyra [place]. Ebony Hail and [employer], LCSW-A Types of Service: Telephone visit  I connected with [name] via  Telephone or Video Enabled Telemedicine Application  (Video is Caregility application) and verified that I am speaking with the correct person using two identifiers. Discussed confidentiality: Yes   I discussed the limitations of telemedicine and the availability of in person appointments.  Discussed there is a possibility of technology failure and discussed alternative modes of communication if that failure occurs.  Patient and/or legal guardian expressed understanding and consented to Telemedicine visit: Yes   Presenting Concerns: Patient and/or family reports the following symptoms/concerns: The patient reports that she has been doing better since her last follow-up appointment. Lenda shared that she enjoyed her Christmas holiday. She explained that this was her first Christmas since the loss of her mother in law and although, she missed her mother in law being with her friends and family helped comfort her. The patient discussed other familial stressors impacting her life currently. Kristen shared she went to the beach with her friends after Christmas and had a great time being away from the stressors of home. She explained that taking a break really boosted her overall mood and she came home less irritable. She reported that her appetite was impacted by dental work she had last Friday; she noted she is on a soft diet. Tarita denied any suicidal or homicidal thoughts.  Duration of problem: Years; Severity of problem: moderate  Patient and/or Family's  Strengths/Protective Factors: Concrete supports in place (healthy food, safe environments, etc.)  Goals Addressed: Patient will:  Reduce symptoms of: agitation, anxiety, depression, insomnia, and stress   Increase knowledge and/or ability of: coping skills, healthy habits, self-management skills, and stress reduction   Demonstrate ability to: Increase healthy adjustment to current life circumstances and Increase adequate support systems for patient/family  Progress towards Goals: Ongoing  Interventions: Interventions utilized:  CBT Cognitive Behavioral Therapywas utilized by the clinician during today's follow up session. Clinician met with patient to identify needs related to stressors and functioning, and assess and monitor for signs and symptoms of anxiety and depression, and assess safety. The clinician processed with the patient how they have been doing since the last follow-up session. Clinician measured the patient's depression and anxiety on a numerical scale. Clinician explored the patient's use of psychotropic medication and determined that the patient continues to take her psychotropic medications daily as prescribed by her provider. The clinician encouraged the patient to continue utilize their coping skills to deal with their current life circumstances.   Standardized Assessments completed: GAD-7 and PHQ 9 PHQ-9= 7 Gad-7 = 9  Assessment: Patient currently experiencing see above.   Patient may benefit from see above.  Plan: Follow up with behavioral health clinician on : 10/14/2020 $RemoveBefo'@12'iLnMLEiACLC$ :00 noon Behavioral recommendations:  Referral(s): [place] (In Clinic)  I discussed the assessment and treatment plan with the patient and/or parent/guardian. They were provided an opportunity to ask questions and all were answered. They agreed with the plan and demonstrated an understanding of the instructions.   They were advised to call back or seek an in-person  evaluation if the symptoms worsen or if the condition fails to improve as anticipated.  [name], LCSWA

## 2021-09-15 ENCOUNTER — Encounter: Payer: Self-pay | Admitting: Pharmacist

## 2021-09-16 ENCOUNTER — Other Ambulatory Visit: Payer: Self-pay | Admitting: Gerontology

## 2021-09-16 ENCOUNTER — Other Ambulatory Visit: Payer: Self-pay

## 2021-09-16 DIAGNOSIS — Z794 Long term (current) use of insulin: Secondary | ICD-10-CM

## 2021-09-16 DIAGNOSIS — E1140 Type 2 diabetes mellitus with diabetic neuropathy, unspecified: Secondary | ICD-10-CM

## 2021-09-16 MED FILL — Ferrous Sulfate Tab 325 MG (65 MG Elemental Fe): ORAL | 90 days supply | Qty: 90 | Fill #1 | Status: AC

## 2021-09-17 ENCOUNTER — Other Ambulatory Visit: Payer: Self-pay

## 2021-09-20 ENCOUNTER — Other Ambulatory Visit: Payer: Self-pay

## 2021-09-21 ENCOUNTER — Other Ambulatory Visit: Payer: Self-pay

## 2021-09-21 MED ORDER — GABAPENTIN 400 MG PO CAPS
ORAL_CAPSULE | ORAL | 0 refills | Status: DC
  Filled 2021-09-21: qty 120, 30d supply, fill #0

## 2021-09-22 ENCOUNTER — Other Ambulatory Visit: Payer: Self-pay

## 2021-09-22 ENCOUNTER — Emergency Department
Admission: EM | Admit: 2021-09-22 | Discharge: 2021-09-22 | Disposition: A | Discharge status: Home / Self Care | Payer: Medicaid Other | Attending: Emergency Medicine | Admitting: Emergency Medicine

## 2021-09-22 ENCOUNTER — Encounter: Payer: Self-pay | Admitting: Emergency Medicine

## 2021-09-22 ENCOUNTER — Emergency Department: Payer: Medicaid Other

## 2021-09-22 DIAGNOSIS — Z20822 Contact with and (suspected) exposure to covid-19: Secondary | ICD-10-CM | POA: Insufficient documentation

## 2021-09-22 DIAGNOSIS — I10 Essential (primary) hypertension: Secondary | ICD-10-CM | POA: Diagnosis not present

## 2021-09-22 DIAGNOSIS — E119 Type 2 diabetes mellitus without complications: Secondary | ICD-10-CM | POA: Insufficient documentation

## 2021-09-22 DIAGNOSIS — R0602 Shortness of breath: Secondary | ICD-10-CM | POA: Insufficient documentation

## 2021-09-22 DIAGNOSIS — R000 Tachycardia, unspecified: Secondary | ICD-10-CM

## 2021-09-22 DIAGNOSIS — I471 Supraventricular tachycardia: Secondary | ICD-10-CM | POA: Diagnosis not present

## 2021-09-22 DIAGNOSIS — R002 Palpitations: Secondary | ICD-10-CM

## 2021-09-22 LAB — BRAIN NATRIURETIC PEPTIDE: B Natriuretic Peptide: 20.7 pg/mL (ref 0.0–100.0)

## 2021-09-22 LAB — CBC
HCT: 40.9 % (ref 36.0–46.0)
Hemoglobin: 14 g/dL (ref 12.0–15.0)
MCH: 30.4 pg (ref 26.0–34.0)
MCHC: 34.2 g/dL (ref 30.0–36.0)
MCV: 88.9 fL (ref 80.0–100.0)
Platelets: 181 10*3/uL (ref 150–400)
RBC: 4.6 MIL/uL (ref 3.87–5.11)
RDW: 12.6 % (ref 11.5–15.5)
WBC: 8.6 10*3/uL (ref 4.0–10.5)
nRBC: 0 % (ref 0.0–0.2)

## 2021-09-22 LAB — BASIC METABOLIC PANEL
Anion gap: 12 (ref 5–15)
BUN: 14 mg/dL (ref 8–23)
CO2: 23 mmol/L (ref 22–32)
Calcium: 9.6 mg/dL (ref 8.9–10.3)
Chloride: 101 mmol/L (ref 98–111)
Creatinine, Ser: 1.04 mg/dL — ABNORMAL HIGH (ref 0.44–1.00)
GFR, Estimated: >60 mL/min (ref >60)
Glucose, Bld: 181 mg/dL — ABNORMAL HIGH (ref 70–99)
Potassium: 4.6 mmol/L (ref 3.5–5.1)
Sodium: 136 mmol/L (ref 135–145)

## 2021-09-22 LAB — RESP PANEL BY RT-PCR (FLU A&B, COVID) ARPGX2
Influenza A by PCR: NEGATIVE
Influenza B by PCR: NEGATIVE
SARS Coronavirus 2 by RT PCR: NEGATIVE

## 2021-09-22 LAB — T4, FREE: Free T4: 1.31 ng/dL — ABNORMAL HIGH (ref 0.61–1.12)

## 2021-09-22 LAB — TROPONIN I (HIGH SENSITIVITY)
Troponin I (High Sensitivity): 5 ng/L (ref <18)
Troponin I (High Sensitivity): 7 ng/L (ref <18)

## 2021-09-22 LAB — LACTATE DEHYDROGENASE: LDH: 145 U/L (ref 98–192)

## 2021-09-22 LAB — TSH: TSH: 0.854 u[IU]/mL (ref 0.350–4.500)

## 2021-09-22 LAB — D-DIMER, QUANTITATIVE: D-Dimer, Quant: 1.09 ug/mL-FEU — ABNORMAL HIGH (ref 0.00–0.50)

## 2021-09-22 MED ORDER — METOPROLOL TARTRATE 25 MG PO TABS
25.0000 mg | ORAL_TABLET | Freq: Once | ORAL | Status: AC
  Administered 2021-09-22: 18:00:00 25 mg via ORAL
  Filled 2021-09-22: qty 1

## 2021-09-22 MED ORDER — SODIUM CHLORIDE 0.9 % IV BOLUS
500.0000 mL | Freq: Once | INTRAVENOUS | Status: AC
  Administered 2021-09-22: 16:00:00 500 mL via INTRAVENOUS

## 2021-09-22 MED ORDER — IOHEXOL 350 MG/ML SOLN
75.0000 mL | Freq: Once | INTRAVENOUS | Status: AC | PRN
  Administered 2021-09-22: 17:00:00 75 mL via INTRAVENOUS

## 2021-09-22 MED ORDER — SODIUM CHLORIDE 0.9 % IV BOLUS: 1000.0000 mL | Freq: Once | INTRAVENOUS | Status: DC

## 2021-09-22 MED ORDER — LORAZEPAM 2 MG/ML IJ SOLN
1.0000 mg | Freq: Once | INTRAMUSCULAR | Status: AC
Start: 2021-09-22 — End: 2021-09-22
  Administered 2021-09-22: 16:00:00 1 mg via INTRAVENOUS
  Filled 2021-09-22: qty 1

## 2021-09-22 MED ORDER — SODIUM CHLORIDE 0.9 % IV BOLUS
1000.0000 mL | Freq: Once | INTRAVENOUS | Status: AC
  Administered 2021-09-22: 19:00:00 1000 mL via INTRAVENOUS

## 2021-09-22 NOTE — Progress Notes (Signed)
S: Patient reports she feels generally unwell. She is on metoprolol 50 at baseline. [facility]  O: BP (!) 145/89    Pulse (!) 111    Temp 98.2 F (36.8 C)    Resp 15    Ht 5\' 2"  (1.575 m)    Wt 75.8 kg    SpO2 100%    BMI 30.56 kg/m  On my exam patient consistently in the 90's. Gen: calm, NAD Pulm: faint wheezes in LUL, otherwise clear to auscultation CV: RRR, no m/r/g, no LE edema Abd: soft, normoactive bowel sounds  A/P: #SVT #Viral illness? - discussed with patient that it is not unusual to feel unwell after administration of adenosine. At this point no indication for admission and her tachycardia is resolved, so I would not recommend admission to the hospital. Instead I suggested we increase her metoprolol from 50>75mg  and she follow up w PCP in 1-2 days to make sure she is tolerating the dose.  In addition we can wait for her COVID swab to come back and treat PRN, as she does seem to also be coming down with a viral illness as well with some wheezing heard on lung [place] (CTPA neg for PNA).   Patient comfortable with this plan. Also discussed w Dr. Jari Pigg who also agrees.

## 2021-09-22 NOTE — ED Provider Notes (Addendum)
Labs show elevated D-dimer therefore CT PE was ordered. Initial troponin was negative depending on repeat TSH was normal however her free T4 was slightly elevated.  Unclear if this is a sign of hyperthyroidism.  I suspect not but we will have her follow-up with endocrine doctor to further discuss. Second troponin was also negative therefore unlikely ACS Her CT PE shows concern for adenopathy concerning for neoplasm.  Discussed the case with Dr. Smith Robert.  Okay to hold off on CT abdomen given they will probably plan to do a PET scan.  They want to call her to schedule appointment.  Discussed with patient going home but patient states that he really does not feel well.  She reports feeling nauseous and just overall unwell and does not feel comfortable with discharge.  Given patient remains tachycardic with episodes of SVT we will give another liter of fluid, COVID swab and discussed with the hospital team for admission.  We will give her p.o. metoprolol.  I discussed with the hospital team for admission    Patient was seen by the hospitalist.  Her tachycardia had since resolved and she is already on metoprolol at home so they recommended increasing it to 75 mg.  She is feeling better I suspect that maybe she was just looking unwell because of the Ativan from earlier but now she is more perky and having a full conversation with me and she feels comfortable with discharge home   her COVID test was negative.  There was question of some wheezing but there is no evidence of pneumonia or PE on CT and she is going to follow-up outpatient with oncology.  Patient felt comfortable with discharge home at that this is reasonable given her reassuring other work-up.  Patient was already given 25 mg of metoprolol here and she will take an additional 50 mg when she gets home.  I discussed the provisional nature of ED diagnosis, the treatment so far, the ongoing plan of care, follow up appointments and return precautions  with the patient and any family or support people present. They expressed understanding and agreed with the plan, discharged home.        Concha Se, MD 09/22/21 [phone]

## 2021-09-22 NOTE — Discharge Instructions (Addendum)
Please follow-up with the above people for further work-up for your heart, thyroid, and your abnormal CT as below.  Take 50 mg of metoprolol when you get home tonight.  Starting tomorrow take 1-1/2 of your pills in the morning and 1-1/2 at night.  Follow-up with your primary care doctor and return to the ER if you have return of symptoms or any other concerns   IMPRESSION: 1. No pulmonary embolus. 2. Mediastinal and right hilar adenopathy. Small nodes are also present at the porta hepatis. This raises concern for neoplasm. Recommend CT of the abdomen pelvis with contrast for further evaluation on a nonemergent basis. 3. Coronary artery disease. 4. Aortic Atherosclerosis (ICD10-I70.0).

## 2021-09-22 NOTE — ED Notes (Signed)
AAOx3.  NAD.  Skin warm and dry. Family at bedside.

## 2021-09-22 NOTE — ED Triage Notes (Signed)
Arrives from home via [facility].  C/O palpitations today while watching TV.  Initial HR 160's.  4 baby ASA and 1 NTG taken prior to EMS arrival due to chest tightness.  18g LFA.  6 mg Adenosine given, and 12 mg Adenosine given.    Current VS:  P:  120 150/90 97.9 CBG:  179

## 2021-09-22 NOTE — ED Provider Notes (Signed)
[facility] Provider Note    Event Date/Time   First MD Initiated Contact with Patient 09/22/21 1338     (approximate)   History   Tachycardia   HPI  Renee Bush is a 61 y.o. female who reports she was sitting on the couch watching television when she had sudden onset of palpitations.  EMS was called and arrived.  Heart rate was in the 160s.  Patient took 4 baby aspirin's and 1 nitro due to chest tightness.  EMS gave 6 of adenosine and 12 with resultant decrease of heart rate down to 120.  When I saw her patient had some tachycardia still about 116.  No shortness of breath chest tightness or any other problems then.  I came back in sometime later to check on her and then she was very anxious her lip was quivering she complained of chest tightness and shortness of breath.  I have ordered a second EKG BNP and troponin and chest x-ray.    Medical History  Medical History Medical History Date Comments  HTN (hypertension)      DM2 (diabetes mellitus, type 2) (CMS-HCC)      GERD (gastroesophageal reflux disease)      HLD (hyperlipidemia)      ETOH abuse      Physical Exam   Triage Vital Signs: ED Triage Vitals  Enc Vitals Group     BP 09/22/21 1338 139/89     Pulse Rate 09/22/21 1338 (!) 116     Resp 09/22/21 1338 16     Temp 09/22/21 1338 98.2 F (36.8 C)     Temp src --      SpO2 09/22/21 1338 99 %     Weight 09/22/21 1336 167 lb 1.7 oz (75.8 kg)     Height 09/22/21 1336 5\' 2"  (1.575 m)     Head Circumference --      Peak Flow --      Pain Score 09/22/21 1336 0     Pain Loc --      Pain Edu? --      Excl. in GC? --     Most recent vital signs: Vitals:   09/22/21 1338  BP: 139/89  Pulse: (!) 116  Resp: 16  Temp: 98.2 F (36.8 C)  SpO2: 99%     General: Initially awake, no distress.  Later anxious appearing complaining of chest pain as noted in HPI CV:  Good peripheral perfusion.  Heart regular rate and rhythm no audible  murmurs somewhat tacky Resp:  Normal effort.  Lungs were clear both initially and later Abd:  No distention.  Abdomen soft bowel sounds positive nontender Extremities no edema   ED Results / Procedures / Treatments   Labs (all labs ordered are listed, but only abnormal results are displayed) Labs Reviewed  BASIC METABOLIC PANEL - Abnormal; Notable for the following components:      Result Value   Glucose, Bld 181 (*)    Creatinine, Ser 1.04 (*)    All other components within normal limits  D-DIMER, QUANTITATIVE - Abnormal; Notable for the following components:   D-Dimer, Quant 1.09 (*)    All other components within normal limits  CBC  TSH  T4, FREE  BRAIN NATRIURETIC PEPTIDE  TROPONIN I (HIGH SENSITIVITY)  TROPONIN I (HIGH SENSITIVITY)     EKG  EMS EKG read and interpreted by me showed sinus rhythm at 137 normal axis flipped T's in 3 and F no  other acute findings. EKG done here shows sinus tachycardia at 123 normal axis flipped T in aVF is no longer present still present in lead III though there is incidentally an S1Q3T3 present.  RADIOLOGY  Initial chest x-ray read by radiology reviewed by me does not show any acute disease Second chest x-ray ordered after patient symptoms return also read by radiology reviewed by me does not show any acute disease  PROCEDURES:  Critical Care performed:   Procedures   MEDICATIONS ORDERED IN ED: Medications  sodium chloride 0.9 % bolus 500 mL (has no administration in time range)  LORazepam (ATIVAN) injection 1 mg (1 mg Intravenous Given 09/22/21 1608)     IMPRESSION / MDM / ASSESSMENT AND PLAN / ED COURSE  I reviewed the triage vital signs and the nursing notes. Because of S1Q3T3 and continuation of patient's symptoms and SVT had resolved I have ordered a D-dimer.  Additionally she has had a second troponin ordered.  I am going to sign these labs out to the oncoming physician.   he patient is on the cardiac monitor to  evaluate for evidence of arrhythmia and/or significant heart rate changes.  Patient remains tachycardic.  Patient with unknown reason for SVT.  TSH is still pending.  Troponins are negative does not seem to be any sign of electrolyte abnormality.  Nothing on the EKG except for the S1Q3T3 which in my experience is about a 50% chance of showing a pulmonary embolus.  Patient has not required any further medication although I did order some Ativan for her as she seemed to be particularly anxious.  Further treatment will be based on the results of the lab work.  Her D-dimer is elevated we will have to get a chest CT to rule out PE.  TSH will need to be returned as well.  Second troponin also will have to be checked.      FINAL CLINICAL IMPRESSION(S) / ED DIAGNOSES   Final diagnoses:  SVT (supraventricular tachycardia) (HCC)  Palpitations  Tachycardia     Rx / DC Orders   ED Discharge Orders     None        Note:  This document was prepared using Dragon voice recognition software and may include unintentional dictation errors.   Arnaldo Natal, MD 09/22/21 [phone]

## 2021-09-23 ENCOUNTER — Other Ambulatory Visit: Payer: Self-pay

## 2021-09-23 ENCOUNTER — Encounter: Payer: Self-pay | Admitting: *Deleted

## 2021-09-23 DIAGNOSIS — R591 Generalized enlarged lymph nodes: Secondary | ICD-10-CM

## 2021-09-23 MED FILL — Cyanocobalamin Inj 1000 MCG/ML: INTRAMUSCULAR | 30 days supply | Qty: 1 | Fill #9 | Status: CN

## 2021-09-23 NOTE — Progress Notes (Signed)
Per Dr. Smith Robert, pt needs assistance with coordinating appt for PET scan. Pt does not have insurance at this time. Authorization not required for PET. PET scan scheduled for 2/1 at 10:30am. Called and spoke with patient and informed of upcoming appt with Dr. Smith Robert for further follow up on Fri 1/27 at 3pm. Informed pt that will review appt details for PET scan at that visit. Pt confirmed appt. Instructed to call back with any questions or needs.   Referral to social work placed at this time since pt does not have insurance and may need further assistance in the future if has cancer diagnosis.

## 2021-09-24 ENCOUNTER — Inpatient Hospital Stay: Payer: Medicaid Other | Attending: Oncology | Admitting: Oncology

## 2021-09-24 ENCOUNTER — Inpatient Hospital Stay: Payer: Medicaid Other

## 2021-09-24 ENCOUNTER — Other Ambulatory Visit: Payer: Self-pay | Admitting: *Deleted

## 2021-09-24 ENCOUNTER — Encounter: Payer: Self-pay | Admitting: Licensed Clinical Social Worker

## 2021-09-24 ENCOUNTER — Encounter: Payer: Self-pay | Admitting: Oncology

## 2021-09-24 ENCOUNTER — Other Ambulatory Visit: Payer: Self-pay

## 2021-09-24 VITALS — BP 161/87 | HR 87 | Temp 98.8°F | Resp 20 | Wt 161.1 lb

## 2021-09-24 DIAGNOSIS — E538 Deficiency of other specified B group vitamins: Secondary | ICD-10-CM | POA: Diagnosis not present

## 2021-09-24 DIAGNOSIS — N183 Chronic kidney disease, stage 3 unspecified: Secondary | ICD-10-CM | POA: Insufficient documentation

## 2021-09-24 DIAGNOSIS — R590 Localized enlarged lymph nodes: Secondary | ICD-10-CM | POA: Insufficient documentation

## 2021-09-24 DIAGNOSIS — Z7982 Long term (current) use of aspirin: Secondary | ICD-10-CM | POA: Diagnosis not present

## 2021-09-24 DIAGNOSIS — Z7985 Long-term (current) use of injectable non-insulin antidiabetic drugs: Secondary | ICD-10-CM | POA: Insufficient documentation

## 2021-09-24 DIAGNOSIS — F411 Generalized anxiety disorder: Secondary | ICD-10-CM | POA: Insufficient documentation

## 2021-09-24 DIAGNOSIS — E1122 Type 2 diabetes mellitus with diabetic chronic kidney disease: Secondary | ICD-10-CM | POA: Diagnosis not present

## 2021-09-24 DIAGNOSIS — K219 Gastro-esophageal reflux disease without esophagitis: Secondary | ICD-10-CM | POA: Insufficient documentation

## 2021-09-24 DIAGNOSIS — R591 Generalized enlarged lymph nodes: Secondary | ICD-10-CM

## 2021-09-24 DIAGNOSIS — I129 Hypertensive chronic kidney disease with stage 1 through stage 4 chronic kidney disease, or unspecified chronic kidney disease: Secondary | ICD-10-CM | POA: Insufficient documentation

## 2021-09-24 DIAGNOSIS — Z87440 Personal history of urinary (tract) infections: Secondary | ICD-10-CM | POA: Diagnosis not present

## 2021-09-24 DIAGNOSIS — E7800 Pure hypercholesterolemia, unspecified: Secondary | ICD-10-CM | POA: Insufficient documentation

## 2021-09-24 DIAGNOSIS — Z79899 Other long term (current) drug therapy: Secondary | ICD-10-CM | POA: Diagnosis not present

## 2021-09-24 NOTE — Progress Notes (Signed)
[facility] Clinical Social Work  Clinical Social Work was referred by Dr. Smith Robert for assessment of psychosocial needs.  Clinical Social Worker contacted patient by phone  to offer support and assess for needs.  CSW left voicemail with contact information and request for return call.      Joseph Art, LCSWA  Clinical Social Worker [facility]

## 2021-09-24 NOTE — Progress Notes (Signed)
Patient states she is having chronic chest pain. Went to the ER on 1/25 Because they could not et her rate down (heart rate 160).

## 2021-09-26 NOTE — Progress Notes (Signed)
Hematology/Oncology Consult note [facility] [phone] [phone]  Patient Care Team: Wolfgang Phoenix, NP as PCP - General (Gerontology)   Name of the patient: Renee Bush  [id number]  12/22/1960    Reason for referral-mediastinal adenopathy   Referring physician-Dr. Conni Slipper  Date of visit: 09/26/21   History of presenting illness- Patient is a 61 year old female who was in the ER on 09/22/2021 with symptoms of palpitations.  She was found to have a heart rate in the 160s.  She had to be given adenosine along with aspirin and nitroglycerin.  Subsequently her tachycardia resolved and she was asked to increase her metoprolol to 75 mg twice daily and follow-up with cardiology.  She has an appointment with Dr. Saunders Revel in March 2023.  During her admission to the hospital she underwent a CT angio chest to rule out PE given her tachycardia and she was found to have right paratracheal lymph node 20 x 15 mm in size, subcarinal lymph node 17 x 26 mm in size.  Right hilar nodal tissue 6 x 17 mm in size.  No evidence of PE.  Findings concerning for neoplasm.  Patient currently reports that her palpitations are improving.  She reports ongoing fatigue.  Denies any changes in her appetite or weight.  Denies any other new complaints at this time.  ECOG PS- 1  Pain scale- 0   Review of systems- Review of Systems  Constitutional:  Positive for malaise/fatigue. Negative for chills, fever and weight loss.  HENT:  Negative for congestion, ear discharge and nosebleeds.   Eyes:  Negative for blurred vision.  Respiratory:  Negative for cough, hemoptysis, sputum production, shortness of breath and wheezing.   Cardiovascular:  Negative for chest pain, palpitations, orthopnea and claudication.  Gastrointestinal:  Negative for abdominal pain, blood in stool, constipation, diarrhea, heartburn, melena, nausea and vomiting.  Genitourinary:  Negative for dysuria,  flank pain, frequency, hematuria and urgency.  Musculoskeletal:  Negative for back pain, joint pain and myalgias.  Skin:  Negative for rash.  Neurological:  Negative for dizziness, tingling, focal weakness, seizures, weakness and headaches.  Endo/Heme/Allergies:  Does not bruise/bleed easily.  Psychiatric/Behavioral:  Negative for depression and suicidal ideas. The patient does not have insomnia.    Allergies  Allergen Reactions   Etodolac Rash   Penicillin G Rash   Penicillins Rash    Has patient had a PCN reaction causing immediate rash, facial/tongue/throat swelling, SOB or lightheadedness with hypotension: Yes Has patient had a PCN reaction causing severe rash involving mucus membranes or skin necrosis: No Has patient had a PCN reaction that required hospitalization: No Has patient had a PCN reaction occurring within the last 10 years: No If all of the above answers are "NO", then may proceed with Cephalosporin use.    Patient Active Problem List   Diagnosis Date Noted   Constipation 07/13/2021   Urinary frequency 07/02/2020   Antibiotic-induced yeast infection 01/09/2020   Urinary tract infection symptoms 12/18/2019   Balance problem 12/18/2019   Abnormal laboratory test result 09/04/2019   Acute sinusitis 08/15/2019   History of allergy 06/11/2019   Family history of colonic polyps    Bilateral edema of lower extremity 05/16/2019   Generalized anxiety disorder 02/07/2019   Anemia 07/05/2018   Abdominal pain 11/09/2017   Nausea 11/09/2017   Insomnia 10/06/2017   GERD (gastroesophageal reflux disease) 10/06/2017   Chronic kidney disease, stage III (moderate) ([place]) 10/06/2017   B12 deficiency 06/09/2016  Hypercholesteremia 05/05/2016   Type 2 diabetes mellitus with diabetic neuropathy, with long-term current use of insulin ([place]) 09/10/2015   Hypertension 09/10/2015   ETOH abuse 04/18/2015   Fatigue 04/18/2015     Past Medical History:  Diagnosis Date   Chronic  kidney disease, stage III (moderate) ([place]) 10/06/2017   Diabetes mellitus without complication (HCC)    GERD (gastroesophageal reflux disease)    Hypercholesteremia    Hypertension 09/10/2015     Past Surgical History:  Procedure Laterality Date   APPENDECTOMY     CESAREAN SECTION  1991   COLONOSCOPY WITH PROPOFOL N/A 06/03/2019   Procedure: COLONOSCOPY WITH PROPOFOL;  Surgeon: Jonathon Bellows, MD;  Location: [facility] ENDOSCOPY;  Service: Gastroenterology;  Laterality: N/A;    Social History   Socioeconomic History   Marital status: Married    Spouse name: Not on file   Number of children: 4   Years of education: Not on file   Highest education level: 11th grade  Occupational History   Occupation: na  Tobacco Use   Smoking status: Former    Types: Cigarettes    Quit date: 04/17/2005    Years since quitting: 16.4   Smokeless tobacco: Never  Vaping Use   Vaping Use: Never used  Substance and Sexual Activity   Alcohol use: Not Currently    Alcohol/week: 14.0 standard drinks    Types: 14 Cans of beer per week    Comment: quit ~2020   Drug use: No   Sexual activity: Not Currently  Other Topics Concern   Not on file  Social History Narrative   Lives in mobile home paid for   Social Determinants of Health   Financial Resource Strain: Not on file  Food Insecurity: No Food Insecurity   Worried About Charity fundraiser in the Last Year: Never true   [place] in the Last Year: Never true  Transportation Needs: No Transportation Needs   Lack of Transportation (Medical): No   Lack of Transportation (Non-Medical): No  Physical Activity: Not on file  Stress: Not on file  Social Connections: Not on file  Intimate Partner Violence: Not on file     Family History  Problem Relation Age of Onset   COPD Mother    Alzheimer's disease Mother    Hypertension Father    Hyperlipidemia Father    Heart attack Father    Diabetes type II Father    Lupus Father    Diabetes type II  Sister    Diabetes type II Sister    Diabetes type II Sister    Gestational diabetes Daughter      Current Outpatient Medications:    aspirin EC 81 MG tablet, Take 1 tablet (81 mg total) by mouth daily., Disp: , Rfl:    cetirizine (ZYRTEC) 10 MG tablet, TAKE ONE TABLET BY MOUTH EVERY DAY, Disp: 90 tablet, Rfl: 1   cyanocobalamin (,VITAMIN B-12,) 1000 MCG/ML injection, INJECT 1 ML INTO THE MUSCLE EVERY 30 DAYS, Disp: 1 mL, Rfl: 12   Dulaglutide 3 MG/0.5ML SOPN, INJECT 3MG  INTO THE SKIN ONCE A WEEK AS DIRECTED, Disp: 16 mL, Rfl: 3   DULoxetine (CYMBALTA) 30 MG capsule, Take 3 capsules (90 mg total) by mouth once daily., Disp: 90 capsule, Rfl: 2   ferrous sulfate (FEROSUL) 325 (65 FE) MG tablet, TAKE ONE TABLET BY MOUTH EVERY DAY WITH BREAKFAST., Disp: 90 tablet, Rfl: 1   gabapentin (NEURONTIN) 400 MG capsule, TAKE ONE CAPSULE BY MOUTH 2  TIMES A DAY AND TAKE 2 CAPSULES BY MOUTH AT BEDTIME, Disp: 120 capsule, Rfl: 0   hydrOXYzine (ATARAX) 25 MG tablet, Take 1 tablet (25 mg total) by mouth 2 (two) times daily as needed., Disp: 60 tablet, Rfl: 2   Insulin Glargine (BASAGLAR KWIKPEN) 100 UNIT/ML, INJECT 17 UNITS UNDER THE SKIN ONCE DAILY AT BEDTIME., Disp: 15 mL, Rfl: 4   Insulin Pen Needle 32G X 6 MM MISC, Use as directed., Disp: 100 each, Rfl: PRN   lisinopril (ZESTRIL) 10 MG tablet, TAKE ONE TABLET BY MOUTH EVERY DAY, Disp: 90 tablet, Rfl: 0   Melatonin 10 MG TABS, Take 1 tablet by mouth at bedtime., Disp: , Rfl:    metFORMIN (GLUCOPHAGE) 500 MG tablet, TAKE ONE TABLET BY MOUTH 2 TIMES A DAY, Disp: 180 tablet, Rfl: 0   metoprolol tartrate (LOPRESSOR) 50 MG tablet, Take 1 tablet (50 mg total) by mouth 2 (two) times daily., Disp: 60 tablet, Rfl: 1   montelukast (SINGULAIR) 10 MG tablet, TAKE ONE TABLET BY MOUTH ONCE DAILY AT BEDTIME., Disp: 90 tablet, Rfl: 0   nitroGLYCERIN (NITROSTAT) 0.4 MG SL tablet, DISSOLVE 1 TABLET UNDER TONGUE AS NEEDED FOR CHEST PAIN EVERY 5 MINUTES FOR MAX OF 3 DOSES IN 15  MINUTES. IF NO RELIEF AFTER FIRST DOSE CALL 911., Disp: 25 tablet, Rfl: 0   Omega-3 Fatty Acids (FISH OIL) 1000 MG CAPS, Take 1,000 mg by mouth 2 (two) times daily., Disp: , Rfl:    omeprazole (PRILOSEC) 20 MG capsule, TAKE ONE CAPSULE BY MOUTH 2 TIMES A DAY BEFORE MEALS, Disp: 60 capsule, Rfl: 3   rosuvastatin (CRESTOR) 5 MG tablet, TAKE ONE TABLET BY MOUTH ONCE DAILY, Disp: 60 tablet, Rfl: 2   senna-docusate (SENOKOT-S) 8.6-50 MG tablet, Take 1 tablet by mouth once daily., Disp: 30 tablet, Rfl: 3   traZODone (DESYREL) 100 MG tablet, Take 1 tablet (100 mg total) by mouth once daily at bedtime., Disp: 30 tablet, Rfl: 2   Physical exam:  Vitals:   09/24/21 1500  BP: (!) 161/87  Pulse: 87  Resp: 20  Temp: 98.8 F (37.1 C)  SpO2: 100%  Weight: 161 lb 1.6 oz (73.1 kg)   Physical Exam Constitutional:      General: She is not in acute distress. Cardiovascular:     Rate and Rhythm: Normal rate and regular rhythm.     Heart sounds: Normal heart sounds.  Pulmonary:     Effort: Pulmonary effort is normal.     Breath sounds: Normal breath sounds.  Abdominal:     General: Bowel sounds are normal.     Palpations: Abdomen is soft.  Lymphadenopathy:     Comments: No palpable cervical, supraclavicular, axillary or inguinal adenopathy    Skin:    General: Skin is warm and dry.  Neurological:     Mental Status: She is alert and oriented to person, place, and time.       CMP Latest Ref Rng & Units 09/22/2021  Glucose 70 - 99 mg/dL 181(H)  BUN 8 - 23 mg/dL 14  Creatinine 0.44 - 1.00 mg/dL 1.04(H)  Sodium 135 - 145 mmol/L 136  Potassium 3.5 - 5.1 mmol/L 4.6  Chloride 98 - 111 mmol/L 101  CO2 22 - 32 mmol/L 23  Calcium 8.9 - 10.3 mg/dL 9.6  Total Protein 6.0 - 8.5 g/dL -  Total Bilirubin 0.0 - 1.2 mg/dL -  Alkaline Phos 44 - 121 IU/L -  AST 0 - 40 IU/L -  ALT  0 - 32 IU/L -   CBC Latest Ref Rng & Units 09/22/2021  WBC 4.0 - 10.5 K/uL 8.6  Hemoglobin 12.0 - 15.0 g/dL 14.0   Hematocrit 36.0 - 46.0 % 40.9  Platelets 150 - 400 K/uL 181    No images are attached to the encounter.  DG Chest 2 View  Result Date: 09/22/2021 CLINICAL DATA:  SVT EXAM: CHEST - 2 VIEW COMPARISON:  08/10/2019 FINDINGS: The heart size and mediastinal contours are within normal limits. Atherosclerotic calcification of the aortic knob. Both lungs are clear. The visualized skeletal structures are unremarkable. IMPRESSION: No active cardiopulmonary disease. Electronically Signed   By: Davina Poke D.O.   On: 09/22/2021 14:23   CT Angio Chest PE W and/or Wo Contrast  Result Date: 09/22/2021 CLINICAL DATA:  Pulmonary embolism suspected, positive D-dimer. Tachycardia. Palpitations. Chest tightness. EXAM: CT ANGIOGRAPHY CHEST WITH CONTRAST TECHNIQUE: Multidetector CT imaging of the chest was performed using the standard protocol during bolus administration of intravenous contrast. Multiplanar CT image reconstructions and MIPs were obtained to evaluate the vascular anatomy. RADIATION DOSE REDUCTION: This exam was performed according to the departmental dose-optimization program which includes automated exposure control, adjustment of the mA and/or kV according to patient size and/or use of iterative reconstruction technique. CONTRAST:  17mL OMNIPAQUE IOHEXOL 350 MG/ML SOLN COMPARISON:  One view chest x-ray 09/22/2021. CT chest with contrast 09/26/2007 FINDINGS: Cardiovascular: Heart size is normal. Coronary artery calcifications are noted. Atherosclerotic changes are present at the aortic arch without aneurysm or definite stenosis. Pulmonary artery opacification is excellent. No focal filling defects are present. Pulmonary artery size is normal. Mediastinum/Nodes: Paratracheal lymph nodes are noted. A right paratracheal node measures 20 x 15 mm. Pretracheal lymph node scratched at a precarinal lymph node measures 9 mm in short access. Subcarinal lymph node measures 17 x 26 mm. Right hilar nodal tissue is  present. Largest area is 6 x 17 mm. No significant axillary adenopathy is present. Lungs/Pleura: No focal nodule, mass, or airspace disease is present. No significant pleural disease is present. No pneumothorax is present. Upper Abdomen: Subcentimeter nodes are present at porta hepatis. No focal solid organ lesions are present. Atherosclerotic changes are present in the aorta without aneurysm. Musculoskeletal: No chest wall abnormality. No acute or significant osseous findings. Review of the MIP images confirms the above findings. IMPRESSION: 1. No pulmonary embolus. 2. Mediastinal and right hilar adenopathy. Small nodes are also present at the porta hepatis. This raises concern for neoplasm. Recommend CT of the abdomen pelvis with contrast for further evaluation on a nonemergent basis. 3. Coronary artery disease. 4. Aortic Atherosclerosis (ICD10-I70.0). These results were called by telephone at the time of interpretation on 09/22/2021 at 4:49 pm to provider [facility] , who verbally acknowledged these results. Electronically Signed   By: San Morelle M.D.   On: 09/22/2021 16:52   DG Chest Portable 1 View  Result Date: 09/22/2021 CLINICAL DATA:  Chest pain.  Palpitations. EXAM: PORTABLE CHEST 1 VIEW COMPARISON:  Radiograph earlier today, 1-1/2 hours prior. FINDINGS: The cardiomediastinal contours are normal. The lungs are clear. Pulmonary vasculature is normal. No consolidation, pleural effusion, or pneumothorax. No acute osseous abnormalities are seen. IMPRESSION: No acute chest findings. Electronically Signed   By: Keith Rake M.D.   On: 09/22/2021 15:29    Assessment and plan- Patient is a 61 y.o. female referred from the ER for mediastinal adenopathy  Patient incidentally found to have hilar and mediastinal adenopathy when CT scan was done for  rule out PE.  She does not have any palpable adenopathy on my exam today.  No B symptoms.It is unclear if adenopathy is malignancy versus could be seen  in nonmalignant condition such as sarcoidosis.  At baseline LDH CBC and CMP are all normal.  Plan is to get a PET CT scan followed by biopsy of the lymph node that could be easily targetable.  I will see her back after biopsy results are back.  Discussed with the patient that adenopathy can be seen in both malignant and nonmalignant conditions.  If biopsy is negative for malignancy I will refer her to pulmonary at that point   Thank you for this kind referral and the opportunity to participate in the care of this patient   Visit Diagnosis 1. Mediastinal adenopathy     Dr. Randa Evens, MD, MPH [facility] [id number] 09/26/2021

## 2021-09-27 ENCOUNTER — Other Ambulatory Visit: Payer: Self-pay

## 2021-09-27 ENCOUNTER — Encounter: Payer: Self-pay | Admitting: Licensed Clinical Social Worker

## 2021-09-27 NOTE — Progress Notes (Signed)
[place] Work  Clinical Social Work was referred by Dr,. Janese Banks for assessment of psychosocial needs.  Clinical Social Worker contacted patient by phone  to offer support and assess for needs.  CSW left voicemail with contact information and request for return call.     Adelene Amas, [place]       Second Attempt

## 2021-09-29 ENCOUNTER — Other Ambulatory Visit: Payer: Self-pay | Admitting: *Deleted

## 2021-09-29 ENCOUNTER — Encounter: Payer: Self-pay | Admitting: Pharmacist

## 2021-09-29 ENCOUNTER — Ambulatory Visit
Admission: RE | Admit: 2021-09-29 | Discharge: 2021-09-29 | Disposition: A | Discharge status: Home / Self Care | Payer: Medicaid Other | Source: Ambulatory Visit | Attending: Oncology | Admitting: Oncology

## 2021-09-29 ENCOUNTER — Other Ambulatory Visit: Payer: Self-pay

## 2021-09-29 DIAGNOSIS — R591 Generalized enlarged lymph nodes: Secondary | ICD-10-CM | POA: Diagnosis present

## 2021-09-29 DIAGNOSIS — I700 Atherosclerosis of aorta: Secondary | ICD-10-CM | POA: Diagnosis not present

## 2021-09-29 DIAGNOSIS — K5710 Diverticulosis of small intestine without perforation or abscess without bleeding: Secondary | ICD-10-CM | POA: Diagnosis not present

## 2021-09-29 LAB — GLUCOSE, CAPILLARY: Glucose-Capillary: 159 mg/dL — ABNORMAL HIGH (ref 70–99)

## 2021-09-29 MED ORDER — FLUDEOXYGLUCOSE F - 18 (FDG) INJECTION
8.3000 | Freq: Once | INTRAVENOUS | Status: AC | PRN
  Administered 2021-09-29: 8.7 via INTRAVENOUS

## 2021-09-29 NOTE — Progress Notes (Signed)
Pt needs assistance with coordinating US guided biopsy of the right supraclavicular lymph node. Order placed and pt will be called once scheduled. Per Dr. Smith Robert, pt needs to follow up with her for results 5-6 days post biopsy.

## 2021-10-01 ENCOUNTER — Other Ambulatory Visit: Payer: Self-pay | Admitting: Student

## 2021-10-01 ENCOUNTER — Encounter: Payer: Self-pay | Admitting: Oncology

## 2021-10-01 NOTE — Progress Notes (Signed)
Patient on schedule for Right Supraclavicular LN biopsy 10/04/2021, called and spoke with this patient who is very adamant about receiving sedation for this procedure. Made aware to be here @ 1230, NPO after 0600, holding am metformin if no food, and driver post procedure/recovery/discharge. Stated understanding.

## 2021-10-04 ENCOUNTER — Other Ambulatory Visit: Payer: Self-pay

## 2021-10-04 ENCOUNTER — Ambulatory Visit
Admission: RE | Admit: 2021-10-04 | Discharge: 2021-10-04 | Disposition: A | Discharge status: Home / Self Care | Payer: Medicaid Other | Source: Ambulatory Visit | Attending: Oncology | Admitting: Oncology

## 2021-10-04 DIAGNOSIS — R590 Localized enlarged lymph nodes: Secondary | ICD-10-CM | POA: Insufficient documentation

## 2021-10-04 DIAGNOSIS — E785 Hyperlipidemia, unspecified: Secondary | ICD-10-CM | POA: Diagnosis not present

## 2021-10-04 DIAGNOSIS — K219 Gastro-esophageal reflux disease without esophagitis: Secondary | ICD-10-CM | POA: Insufficient documentation

## 2021-10-04 DIAGNOSIS — E1122 Type 2 diabetes mellitus with diabetic chronic kidney disease: Secondary | ICD-10-CM | POA: Diagnosis not present

## 2021-10-04 DIAGNOSIS — I2510 Atherosclerotic heart disease of native coronary artery without angina pectoris: Secondary | ICD-10-CM | POA: Diagnosis not present

## 2021-10-04 DIAGNOSIS — N183 Chronic kidney disease, stage 3 unspecified: Secondary | ICD-10-CM | POA: Insufficient documentation

## 2021-10-04 DIAGNOSIS — R591 Generalized enlarged lymph nodes: Secondary | ICD-10-CM

## 2021-10-04 DIAGNOSIS — I129 Hypertensive chronic kidney disease with stage 1 through stage 4 chronic kidney disease, or unspecified chronic kidney disease: Secondary | ICD-10-CM | POA: Diagnosis not present

## 2021-10-04 LAB — GLUCOSE, CAPILLARY: Glucose-Capillary: 138 mg/dL — ABNORMAL HIGH (ref 70–99)

## 2021-10-04 MED ORDER — MIDAZOLAM HCL 5 MG/5ML IJ SOLN
INTRAMUSCULAR | Status: AC | PRN
  Administered 2021-10-04: 1 mg via INTRAVENOUS

## 2021-10-04 MED ORDER — MIDAZOLAM HCL 2 MG/2ML IJ SOLN
INTRAMUSCULAR | Status: AC
  Filled 2021-10-04: qty 2

## 2021-10-04 MED ORDER — FENTANYL CITRATE (PF) 100 MCG/2ML IJ SOLN
INTRAMUSCULAR | Status: AC | PRN
  Administered 2021-10-04: 50 ug via INTRAVENOUS

## 2021-10-04 MED ORDER — SODIUM CHLORIDE 0.9 % IV SOLN: INTRAVENOUS | Status: DC

## 2021-10-04 MED ORDER — FENTANYL CITRATE (PF) 100 MCG/2ML IJ SOLN
INTRAMUSCULAR | Status: AC
  Filled 2021-10-04: qty 2

## 2021-10-04 NOTE — H&P (Signed)
Chief Complaint: Patient was seen in consultation today for right supraclavicular lymph node biopsy.  Referring Physician(s): Rao,Archana C  Supervising Physician: Juliet Rude  Patient Status: [facility] - Out-pt  History of Present Illness: Renee Bush is a 61 y.o. female with a past medical history significant for GERD, HTN, HLD, DM, CKD III who presents today for a right supraclavicular lymph node biopsy. Renee Bush presented to [facility] ED on 09/22/21 due to palpitations, she has found to have tachycardia up to 160s and required adenosine, ASA and nitroglycerin. She underwent a CTA chest to rule out PE and was found to have the following:  1. No pulmonary embolus. 2. Mediastinal and right hilar adenopathy. Small nodes are also present at the porta hepatis. This raises concern for neoplasm. Recommend CT of the abdomen pelvis with contrast for further evaluation on a nonemergent basis. 3. Coronary artery disease.  She was discharged later that same day from the ED with outpatient oncology follow up. She met with Dr. Janese Banks on 09/24/21 and underwent PET scan on 09/29/21 which showed:  1. Hypermetabolic lymphadenopathy in both hilar regions in the mediastinum with associated hypermetabolic lymph nodes in the right supraclavicular region and left thoracic inlet. Upper normal portal caval lymph nodes in the abdomen show low level hypermetabolism. Metastatic disease or lymphoproliferative disorder would be primary concerns. Inflammatory etiology such as sarcoidosis is also a consideration. No definite hypermetabolic primary lesion on today's study.  She has been referred to IR for a right supraclavicular lymph node biopsy to further direct care.   Past Medical History:  Diagnosis Date   Chronic kidney disease, stage III (moderate) ([place]) 10/06/2017   Diabetes mellitus without complication (HCC)    GERD (gastroesophageal reflux disease)    Hypercholesteremia    Hypertension  09/10/2015    Past Surgical History:  Procedure Laterality Date   APPENDECTOMY     CESAREAN SECTION  1991   COLONOSCOPY WITH PROPOFOL N/A 06/03/2019   Procedure: COLONOSCOPY WITH PROPOFOL;  Surgeon: Jonathon Bellows, MD;  Location: [facility] ENDOSCOPY;  Service: Gastroenterology;  Laterality: N/A;    Allergies: Etodolac, Penicillin g, and Penicillins  Medications: Prior to Admission medications   Medication Sig Start Date End Date Taking? Authorizing Provider  aspirin EC 81 MG tablet Take 1 tablet (81 mg total) by mouth daily. 10/05/17   Arnetha Courser, MD  cetirizine (ZYRTEC) 10 MG tablet TAKE ONE TABLET BY MOUTH EVERY DAY 08/11/21 08/11/22  Iloabachie, Chioma E, NP  cyanocobalamin (,VITAMIN B-12,) 1000 MCG/ML injection INJECT 1 ML INTO THE MUSCLE EVERY 30 DAYS 11/10/20 11/10/21  Iloabachie, Chioma E, NP  Dulaglutide 3 MG/0.5ML SOPN INJECT $RemoveBef'3MG'fbMoRYfSNZ$  INTO THE SKIN ONCE A WEEK AS DIRECTED 12/30/20 12/30/21  Iloabachie, Chioma E, NP  DULoxetine (CYMBALTA) 30 MG capsule Take 3 capsules (90 mg total) by mouth once daily. 07/06/21   Iloabachie, Chioma E, NP  ferrous sulfate (FEROSUL) 325 (65 FE) MG tablet TAKE ONE TABLET BY MOUTH EVERY DAY WITH BREAKFAST. 06/15/21   Iloabachie, Chioma E, NP  gabapentin (NEURONTIN) 400 MG capsule TAKE ONE CAPSULE BY MOUTH 2 TIMES A DAY AND TAKE 2 CAPSULES BY MOUTH AT BEDTIME 09/21/21 09/21/22  Iloabachie, Chioma E, NP  hydrOXYzine (ATARAX) 25 MG tablet Take 1 tablet (25 mg total) by mouth 2 (two) times daily as needed. 07/06/21   Iloabachie, Chioma E, NP  Insulin Glargine (BASAGLAR KWIKPEN) 100 UNIT/ML INJECT 17 UNITS UNDER THE SKIN ONCE DAILY AT BEDTIME. 01/13/21 10/31/21  Iloabachie, Lonny Prude, NP  Insulin Pen Needle 32G X 6 MM MISC Use as directed. 04/13/21   Ellie Lunch K, RPH  lisinopril (ZESTRIL) 10 MG tablet TAKE ONE TABLET BY MOUTH EVERY DAY 07/13/21 07/13/22  Iloabachie, Chioma E, NP  Melatonin 10 MG TABS Take 1 tablet by mouth at bedtime.    [provider]  metFORMIN  (GLUCOPHAGE) 500 MG tablet TAKE ONE TABLET BY MOUTH 2 TIMES A DAY 07/13/21 07/13/22  Iloabachie, Chioma E, NP  metoprolol tartrate (LOPRESSOR) 50 MG tablet Take 1 tablet (50 mg total) by mouth 2 (two) times daily. 08/01/21 08/01/22  Iloabachie, Chioma E, NP  montelukast (SINGULAIR) 10 MG tablet TAKE ONE TABLET BY MOUTH ONCE DAILY AT BEDTIME. 08/11/21 08/11/22  Iloabachie, Chioma E, NP  nitroGLYCERIN (NITROSTAT) 0.4 MG SL tablet DISSOLVE 1 TABLET UNDER TONGUE AS NEEDED FOR CHEST PAIN EVERY 5 MINUTES FOR MAX OF 3 DOSES IN 15 MINUTES. IF NO RELIEF AFTER FIRST DOSE CALL 911. 03/17/21 03/17/22  Iloabachie, Chioma E, NP  Omega-3 Fatty Acids (FISH OIL) 1000 MG CAPS Take 1,000 mg by mouth 2 (two) times daily.    [provider]  omeprazole (PRILOSEC) 20 MG capsule TAKE ONE CAPSULE BY MOUTH 2 TIMES A DAY BEFORE MEALS 07/13/21 12/29/21  Iloabachie, Chioma E, NP  rosuvastatin (CRESTOR) 5 MG tablet TAKE ONE TABLET BY MOUTH ONCE DAILY 07/13/21   Iloabachie, Chioma E, NP  senna-docusate (SENOKOT-S) 8.6-50 MG tablet Take 1 tablet by mouth once daily. 07/13/21   Iloabachie, Chioma E, NP  traZODone (DESYREL) 100 MG tablet Take 1 tablet (100 mg total) by mouth once daily at bedtime. 07/06/21 10/06/21  Iloabachie, Lonny Prude, NP     Family History  Problem Relation Age of Onset   COPD Mother    Alzheimer's disease Mother    Hypertension Father    Hyperlipidemia Father    Heart attack Father    Diabetes type II Father    Lupus Father    Diabetes type II Sister    Diabetes type II Sister    Diabetes type II Sister    Gestational diabetes Daughter     Social History   Socioeconomic History   Marital status: Married    Spouse name: Not on file   Number of children: 4   Years of education: Not on file   Highest education level: 11th grade  Occupational History   Occupation: na  Tobacco Use   Smoking status: Former    Types: Cigarettes    Quit date: 04/17/2005    Years since quitting: 16.4   Smokeless  tobacco: Never  Vaping Use   Vaping Use: Never used  Substance and Sexual Activity   Alcohol use: Not Currently    Alcohol/week: 14.0 standard drinks    Types: 14 Cans of beer per week    Comment: quit ~2020   Drug use: No   Sexual activity: Not Currently  Other Topics Concern   Not on file  Social History Narrative   Lives in mobile home paid for   Social Determinants of Health   Financial Resource Strain: Not on file  Food Insecurity: No Food Insecurity   Worried About Charity fundraiser in the Last Year: Never true   [place] in the Last Year: Never true  Transportation Needs: No Transportation Needs   Lack of Transportation (Medical): No   Lack of Transportation (Non-Medical): No  Physical Activity: Not on file  Stress: Not on file  Social Connections: Not on  file     Review of Systems: A 12 point ROS discussed and pertinent positives are indicated in the HPI above.  All other systems are negative.  Review of Systems  Constitutional:  Positive for fatigue. Negative for chills and fever.  Respiratory:  Positive for shortness of breath. Negative for cough.   Cardiovascular:  Negative for chest pain.  Gastrointestinal:  Negative for abdominal pain, nausea and vomiting.  Musculoskeletal:  Negative for back pain.  Neurological:  Negative for headaches.   Vital Signs: BP (!) 144/80    Pulse 79    Temp 98.3 F (36.8 C) (Oral)    Resp 20    Ht $R'5\' 2"'cO$  (1.575 m)    Wt 161 lb (73 kg)    SpO2 99%    BMI 29.45 kg/m   Physical Exam Vitals reviewed.  Constitutional:      General: She is not in acute distress. HENT:     Head: Normocephalic.     Mouth/Throat:     Mouth: Mucous membranes are moist.     Pharynx: Oropharynx is clear. No oropharyngeal exudate or posterior oropharyngeal erythema.  Cardiovascular:     Rate and Rhythm: Regular rhythm. Tachycardia present.  Pulmonary:     Effort: Pulmonary effort is normal.     Breath sounds: Normal breath sounds.   Abdominal:     General: There is no distension.     Palpations: Abdomen is soft.     Tenderness: There is no abdominal tenderness.  Skin:    General: Skin is warm and dry.  Neurological:     Mental Status: She is alert and oriented to person, place, and time.  Psychiatric:        Mood and Affect: Mood normal.        Thought Content: Thought content normal.        Judgment: Judgment normal.     MD Evaluation Airway: WNL Heart: WNL Abdomen: WNL Chest/ Lungs: WNL ASA  Classification: 2 Mallampati/Airway Score: Two   Imaging: DG Chest 2 View  Result Date: 09/22/2021 CLINICAL DATA:  SVT EXAM: CHEST - 2 VIEW COMPARISON:  08/10/2019 FINDINGS: The heart size and mediastinal contours are within normal limits. Atherosclerotic calcification of the aortic knob. Both lungs are clear. The visualized skeletal structures are unremarkable. IMPRESSION: No active cardiopulmonary disease. Electronically Signed   By: Davina Poke D.O.   On: 09/22/2021 14:23   CT Angio Chest PE W and/or Wo Contrast  Result Date: 09/22/2021 CLINICAL DATA:  Pulmonary embolism suspected, positive D-dimer. Tachycardia. Palpitations. Chest tightness. EXAM: CT ANGIOGRAPHY CHEST WITH CONTRAST TECHNIQUE: Multidetector CT imaging of the chest was performed using the standard protocol during bolus administration of intravenous contrast. Multiplanar CT image reconstructions and MIPs were obtained to evaluate the vascular anatomy. RADIATION DOSE REDUCTION: This exam was performed according to the departmental dose-optimization program which includes automated exposure control, adjustment of the mA and/or kV according to patient size and/or use of iterative reconstruction technique. CONTRAST:  75mL OMNIPAQUE IOHEXOL 350 MG/ML SOLN COMPARISON:  One view chest x-ray 09/22/2021. CT chest with contrast 09/26/2007 FINDINGS: Cardiovascular: Heart size is normal. Coronary artery calcifications are noted. Atherosclerotic changes are  present at the aortic arch without aneurysm or definite stenosis. Pulmonary artery opacification is excellent. No focal filling defects are present. Pulmonary artery size is normal. Mediastinum/Nodes: Paratracheal lymph nodes are noted. A right paratracheal node measures 20 x 15 mm. Pretracheal lymph node scratched at a precarinal lymph node measures 9 mm in  short access. Subcarinal lymph node measures 17 x 26 mm. Right hilar nodal tissue is present. Largest area is 6 x 17 mm. No significant axillary adenopathy is present. Lungs/Pleura: No focal nodule, mass, or airspace disease is present. No significant pleural disease is present. No pneumothorax is present. Upper Abdomen: Subcentimeter nodes are present at porta hepatis. No focal solid organ lesions are present. Atherosclerotic changes are present in the aorta without aneurysm. Musculoskeletal: No chest wall abnormality. No acute or significant osseous findings. Review of the MIP images confirms the above findings. IMPRESSION: 1. No pulmonary embolus. 2. Mediastinal and right hilar adenopathy. Small nodes are also present at the porta hepatis. This raises concern for neoplasm. Recommend CT of the abdomen pelvis with contrast for further evaluation on a nonemergent basis. 3. Coronary artery disease. 4. Aortic Atherosclerosis (ICD10-I70.0). These results were called by telephone at the time of interpretation on 09/22/2021 at 4:49 pm to provider [facility] , who verbally acknowledged these results. Electronically Signed   By: San Morelle M.D.   On: 09/22/2021 16:52   NM PET Image Initial (PI) Skull Base To Thigh  Result Date: 09/29/2021 CLINICAL DATA:  Initial treatment strategy for lymphadenopathy. EXAM: NUCLEAR MEDICINE PET SKULL BASE TO THIGH TECHNIQUE: 8.7 mCi F-18 FDG was injected intravenously. Full-ring PET imaging was performed from the skull base to thigh after the radiotracer. CT data was obtained and used for attenuation correction and anatomic  localization. Fasting blood glucose: 157 mg/dl COMPARISON:  CTA Chest 09/22/2021 FINDINGS: Mediastinal blood pool activity: SUV max 2.9 Liver activity: SUV max NA NECK: 10 mm short axis right supraclavicular node on 58/5 is hypermetabolic with SUV max = 3.9. No other hypermetabolic cervical lymphadenopathy. Incidental CT findings: none CHEST: Previously identified mediastinal lymphadenopathy is hypermetabolic. Index 17 mm short axis right paratracheal node on 77/3 demonstrates SUV max = 7.8. Hypermetabolic pre-vascular and left thoracic inlet lymphadenopathy evident. Hypermetabolic lymphadenopathy in the right hilum demonstrates SUV max = 8.7. 13 mm short axis subcarinal lymph node is hypermetabolic with SUV max = 9.6. Hypermetabolic lymphadenopathy noted in the left hilum. No hypermetabolic pulmonary nodule or mass. Incidental CT findings: No lung consolidation.  No pleural effusion. ABDOMEN/PELVIS: No abnormal hypermetabolic activity within the liver, pancreas, adrenal glands, or spleen. Small portal caval lymph nodes measuring about 7 mm short axis on image 137/3 shows low level hypermetabolism with SUV max = 4. No other hypermetabolic lymphadenopathy in the abdomen or pelvis. Diffuse FDG accumulation is identified in the stomach, small bowel, and colon likely physiologic. Incidental CT findings: Tiny duodenal diverticulum noted. Diverticular changes are noted in the left colon without diverticulitis. There is moderate atherosclerotic calcification of the abdominal aorta without aneurysm. SKELETON: No focal hypermetabolic activity to suggest skeletal metastasis. No worrisome lytic or sclerotic osseous abnormality. Incidental CT findings: none IMPRESSION: 1. Hypermetabolic lymphadenopathy in both hilar regions in the mediastinum with associated hypermetabolic lymph nodes in the right supraclavicular region and left thoracic inlet. Upper normal portal caval lymph nodes in the abdomen show low level hypermetabolism.  Metastatic disease or lymphoproliferative disorder would be primary concerns. Inflammatory etiology such as sarcoidosis is also a consideration. No definite hypermetabolic primary lesion on today's study. 2.  Aortic Atherosclerois (ICD10-170.0) Electronically Signed   By: Misty Stanley M.D.   On: 09/29/2021 12:29   DG Chest Portable 1 View  Result Date: 09/22/2021 CLINICAL DATA:  Chest pain.  Palpitations. EXAM: PORTABLE CHEST 1 VIEW COMPARISON:  Radiograph earlier today, 1-1/2 hours prior. FINDINGS: The cardiomediastinal  contours are normal. The lungs are clear. Pulmonary vasculature is normal. No consolidation, pleural effusion, or pneumothorax. No acute osseous abnormalities are seen. IMPRESSION: No acute chest findings. Electronically Signed   By: Keith Rake M.D.   On: 09/22/2021 15:29    Labs:  CBC: Recent Labs    03/10/21 1004 09/22/21 1346  WBC 5.9 8.6  HGB 12.7 14.0  HCT 39.7 40.9  PLT 181 181    COAGS: No results for input(s): INR, APTT in the last 8760 hours.  BMP: Recent Labs    03/10/21 1004 05/05/21 1024 07/13/21 1130 09/22/21 1346  NA 141 143 141 136  K 4.9 4.9 4.2 4.6  CL 103 104 104 101  CO2 $Re'25 24 24 23  'ovz$ GLUCOSE 207* 134* 110* 181*  BUN $Re'22 15 17 14  'uCf$ CALCIUM 9.2 9.3 9.3 9.6  CREATININE 1.15* 1.17* 1.01* 1.04*  GFRNONAA  --   --   --  >60    LIVER FUNCTION TESTS: Recent Labs    03/10/21 1004  BILITOT 0.2  AST 14  ALT 13  ALKPHOS 58  PROT 5.8*  ALBUMIN 4.1    TUMOR MARKERS: No results for input(s): AFPTM, CEA, CA199, CHROMGRNA in the last 8760 hours.  Assessment and Plan:  61 y/o F who presented to [facility] ED on 09/22/21 with palpitations, she underwent CTA chest to r/o PE and was found to have mediastinal, right hilar, porta hepatis lymphadenopathy. She underwent PET on 2/1 per oncology which noted hypermetabolic lymphadenopathy in both hilar regions, right supraclavicular region and left thoracic inlet. IR was consulted for biopsy and she has  been approved for a right supraclavicular lymph node biopsy for which she presents today.  Risks and benefits of right supraclavicular lymph node biopsy was discussed with the patient and/or patient's family including, but not limited to bleeding, infection, damage to adjacent structures or low yield requiring additional tests.  All of the questions were answered and there is agreement to proceed.  Consent signed and in chart.   Thank you for this interesting consult.  I greatly enjoyed meeting ALAYIA MEGGISON and look forward to participating in their care.  A copy of this report was sent to the requesting provider on this date.  Electronically Signed: Joaquim Nam, PA-C 10/04/2021, 12:50 PM   I spent a total of 30 Minutes  in face to face in clinical consultation, greater than 50% of which was counseling/coordinating care for right supraclavicular lymph node biopsy.

## 2021-10-04 NOTE — Procedures (Addendum)
Interventional Radiology Procedure Note  Date of Procedure: 10/04/2021  Procedure: US guided LN biopsy   Findings:  1. Successful US guided right supraclavicular LN biopsy 18ga CNB x6 passes    Complications: No immediate complications noted.   Estimated Blood Loss: minimal  Follow-up and Recommendations: 1. None    Olive Bass, MD  Vascular & Interventional Radiology  10/04/2021 2:01 PM

## 2021-10-05 ENCOUNTER — Other Ambulatory Visit: Payer: Self-pay | Admitting: Gerontology

## 2021-10-05 ENCOUNTER — Other Ambulatory Visit: Payer: Self-pay

## 2021-10-05 ENCOUNTER — Encounter: Payer: Self-pay | Admitting: Licensed Clinical Social Worker

## 2021-10-05 DIAGNOSIS — I10 Essential (primary) hypertension: Secondary | ICD-10-CM

## 2021-10-05 DIAGNOSIS — Z889 Allergy status to unspecified drugs, medicaments and biological substances status: Secondary | ICD-10-CM

## 2021-10-05 MED ORDER — METOPROLOL TARTRATE 50 MG PO TABS
50.0000 mg | ORAL_TABLET | Freq: Two times a day (BID) | ORAL | 1 refills | Status: DC
  Filled 2021-10-05: qty 60, 30d supply, fill #0

## 2021-10-05 MED ORDER — MONTELUKAST SODIUM 10 MG PO TABS
ORAL_TABLET | Freq: Every day | ORAL | 0 refills | Status: DC
Start: 2021-10-05 — End: 2022-03-22
  Filled 2021-10-05: qty 90, fill #0
  Filled 2021-12-12 – 2021-12-22 (×2): qty 90, 90d supply, fill #0

## 2021-10-05 MED ORDER — NITROGLYCERIN 0.4 MG SL SUBL
SUBLINGUAL_TABLET | SUBLINGUAL | 0 refills | Status: AC
  Filled 2021-10-05: qty 25, 30d supply, fill #0

## 2021-10-05 NOTE — Progress Notes (Signed)
[facility] Clinical Social Work  Clinical Social Work was referred by NP for assessment of psychosocial needs.  Clinical Social Worker contacted patient by phone  to offer support and assess for needs.  CSW left voicemail with contact information and request for return call.   Joseph Art, LCSW  Clinical Social Worker [facility]       Third Attempt

## 2021-10-06 ENCOUNTER — Other Ambulatory Visit: Payer: Self-pay

## 2021-10-06 DIAGNOSIS — Z794 Long term (current) use of insulin: Secondary | ICD-10-CM

## 2021-10-06 DIAGNOSIS — E1140 Type 2 diabetes mellitus with diabetic neuropathy, unspecified: Secondary | ICD-10-CM

## 2021-10-06 MED FILL — Cyanocobalamin Inj 1000 MCG/ML: INTRAMUSCULAR | 30 days supply | Qty: 1 | Fill #9 | Status: AC

## 2021-10-07 ENCOUNTER — Ambulatory Visit: Payer: Self-pay | Admitting: Licensed Clinical Social Worker

## 2021-10-07 DIAGNOSIS — F339 Major depressive disorder, recurrent, unspecified: Secondary | ICD-10-CM

## 2021-10-07 DIAGNOSIS — F411 Generalized anxiety disorder: Secondary | ICD-10-CM

## 2021-10-07 LAB — LIPID PANEL

## 2021-10-07 NOTE — BH Specialist Note (Addendum)
Integrated Behavioral Health via Telemedicine Visit  10/07/2021 Renee Bush [id number]   Referring Provider: Carlyon Shadow, NP Patient/Family location: The patient's home [facility] Provider location: [facility] [place] All persons participating in visit: Renee Bush. Renee Bush and Renee Bush, MSW, LCSW-A Types of Service: Telephone visit  I connected with Renee Bush via  Telephone or Video Enabled Telemedicine Application  (Video is Caregility application) and verified that I am speaking with the correct person using two identifiers. Discussed confidentiality: Yes   I discussed the limitations of telemedicine and the availability of in person appointments.  Discussed there is a possibility of technology failure and discussed alternative modes of communication if that failure occurs.  Patient and/or legal guardian expressed understanding and consented to Telemedicine visit: Yes   Presenting Concerns: Patient and/or family reports the following symptoms/concerns: The patient reports that she has been doing worse since her last follow-up appointment. She shared that two weeks ago she experienced chest pain and heart palpitations and her husband called EMS and she was transported to the hospital. Renee Bush discussed her recent health stressors that are impacting her life. She shared that she has experienced increased anxiety and feelings of panic since that time. Renee Bush noted that her family has been supportive and check in with her often and she has received many calls from friends which have helped. The patient denied any suicidal or homicidal thoughts.  Duration of problem: Years; Severity of problem: moderate  Patient and/or Family's Strengths/Protective Factors: Social connections, Concrete supports in place (healthy food, safe environments, etc.), and Sense of purpose  Goals Addressed: Patient will:  Reduce symptoms of: agitation, anxiety, depression,  insomnia, and stress   Increase knowledge and/or ability of: coping skills, healthy habits, self-management skills, and stress reduction   Demonstrate ability to: Increase healthy adjustment to current life circumstances and Increase adequate support systems for patient/family  Progress towards Goals: Ongoing  Interventions: Interventions utilized:  Mindfulness or Relaxation Training, Guided Imagery, and ACT (Acceptance and Commitment Therapy)was utilized by the clinician during today's follow up session. Clinician met with patient to identify needs related to stressors and functioning, and assess and monitor for signs and symptoms of anxiety and depression, and assess safety. The clinician processed with the patient how they have been doing since the last follow-up session. Clinician measured the patient's anxiety and depression on a numerical scale. Clinician intervened with positive regard and optimism to validate client's emotions, and supported client in verbalizing her feelings regarding her recent health stressors. The session ended in scheduling.  Standardized Assessments completed:  Will access at follow-up due to time constraints of today's session.  Assessment: Patient currently experiencing see above.   Patient may benefit from see above.  Plan: Follow up with behavioral health clinician on : 10/14/2021 Behavioral recommendations:  Referral(s): [place] (In Clinic)  I discussed the assessment and treatment plan with the patient and/or parent/guardian. They were provided an opportunity to ask questions and all were answered. They agreed with the plan and demonstrated an understanding of the instructions.   They were advised to call back or seek an in-person evaluation if the symptoms worsen or if the condition fails to improve as anticipated.  Renee Bush, LCSWA

## 2021-10-08 ENCOUNTER — Inpatient Hospital Stay: Payer: Self-pay | Admitting: Oncology

## 2021-10-08 LAB — MICROALBUMIN / CREATININE URINE RATIO
Creatinine, Urine: 89.7 mg/dL
Microalb/Creat Ratio: 4 mg/g creat (ref 0–29)
Microalbumin, Urine: 3.6 ug/mL

## 2021-10-08 LAB — COMPREHENSIVE METABOLIC PANEL
ALT: 22 IU/L (ref 0–32)
AST: 24 IU/L (ref 0–40)
Albumin/Globulin Ratio: 2.3 — ABNORMAL HIGH (ref 1.2–2.2)
Albumin: 4.2 g/dL (ref 3.8–4.8)
Alkaline Phosphatase: 60 IU/L (ref 44–121)
BUN/Creatinine Ratio: 9 — ABNORMAL LOW (ref 12–28)
BUN: 9 mg/dL (ref 8–27)
Bilirubin Total: 0.3 mg/dL (ref 0.0–1.2)
CO2: 25 mmol/L (ref 20–29)
Calcium: 9.1 mg/dL (ref 8.7–10.3)
Chloride: 102 mmol/L (ref 96–106)
Creatinine, Ser: 1.01 mg/dL — ABNORMAL HIGH (ref 0.57–1.00)
Globulin, Total: 1.8 g/dL (ref 1.5–4.5)
Glucose: 148 mg/dL — ABNORMAL HIGH (ref 70–99)
Potassium: 4.4 mmol/L (ref 3.5–5.2)
Sodium: 143 mmol/L (ref 134–144)
Total Protein: 6 g/dL (ref 6.0–8.5)
eGFR: 63 mL/min/{1.73_m2} (ref >59)

## 2021-10-08 LAB — LIPID PANEL
Chol/HDL Ratio: 4.1 ratio (ref 0.0–4.4)
Cholesterol, Total: 163 mg/dL (ref 100–199)
HDL: 40 mg/dL (ref >39)
LDL Chol Calc (NIH): 96 mg/dL (ref 0–99)
Triglycerides: 152 mg/dL — ABNORMAL HIGH (ref 0–149)
VLDL Cholesterol Cal: 27 mg/dL (ref 5–40)

## 2021-10-08 LAB — HEMOGLOBIN A1C
Est. average glucose Bld gHb Est-mCnc: 163 mg/dL
Hgb A1c MFr Bld: 7.3 % — ABNORMAL HIGH (ref 4.8–5.6)

## 2021-10-11 ENCOUNTER — Other Ambulatory Visit: Payer: Self-pay | Admitting: *Deleted

## 2021-10-11 ENCOUNTER — Encounter: Payer: Self-pay | Admitting: Oncology

## 2021-10-11 ENCOUNTER — Telehealth: Payer: Self-pay | Admitting: *Deleted

## 2021-10-11 DIAGNOSIS — R591 Generalized enlarged lymph nodes: Secondary | ICD-10-CM

## 2021-10-11 LAB — SURGICAL PATHOLOGY

## 2021-10-11 NOTE — Telephone Encounter (Signed)
Message left with patient to inform of appt scheduled for her to see Dr. Patsey Berthold for consultation on 2/14 at 4pm. Instructed pt to call back with any questions or needs.

## 2021-10-12 ENCOUNTER — Encounter: Payer: Self-pay | Admitting: Pulmonary Disease

## 2021-10-12 ENCOUNTER — Ambulatory Visit: Payer: Self-pay | Admitting: Gerontology

## 2021-10-12 ENCOUNTER — Other Ambulatory Visit: Payer: Self-pay

## 2021-10-12 ENCOUNTER — Encounter: Payer: Self-pay | Admitting: Oncology

## 2021-10-12 ENCOUNTER — Ambulatory Visit (INDEPENDENT_AMBULATORY_CARE_PROVIDER_SITE_OTHER): Payer: Self-pay | Admitting: Pulmonary Disease

## 2021-10-12 ENCOUNTER — Other Ambulatory Visit
Admission: RE | Admit: 2021-10-12 | Discharge: 2021-10-12 | Disposition: A | Discharge status: Home / Self Care | Payer: Medicaid Other | Attending: Pulmonary Disease | Admitting: Pulmonary Disease

## 2021-10-12 ENCOUNTER — Encounter: Payer: Self-pay | Admitting: Gerontology

## 2021-10-12 ENCOUNTER — Inpatient Hospital Stay: Payer: Medicaid Other | Attending: Oncology | Admitting: Oncology

## 2021-10-12 VITALS — BP 98/60 | HR 97 | Temp 97.9°F | Ht 62.0 in | Wt 164.4 lb

## 2021-10-12 VITALS — BP 102/69 | HR 89 | Temp 97.8°F | Resp 16 | Ht 62.0 in | Wt 163.0 lb

## 2021-10-12 VITALS — BP 100/69 | HR 97 | Temp 96.8°F | Ht 62.0 in | Wt 164.5 lb

## 2021-10-12 DIAGNOSIS — R590 Localized enlarged lymph nodes: Secondary | ICD-10-CM | POA: Diagnosis present

## 2021-10-12 DIAGNOSIS — D869 Sarcoidosis, unspecified: Secondary | ICD-10-CM | POA: Diagnosis present

## 2021-10-12 DIAGNOSIS — M2550 Pain in unspecified joint: Secondary | ICD-10-CM

## 2021-10-12 DIAGNOSIS — Z794 Long term (current) use of insulin: Secondary | ICD-10-CM

## 2021-10-12 DIAGNOSIS — E1140 Type 2 diabetes mellitus with diabetic neuropathy, unspecified: Secondary | ICD-10-CM

## 2021-10-12 DIAGNOSIS — R0602 Shortness of breath: Secondary | ICD-10-CM

## 2021-10-12 DIAGNOSIS — F3341 Major depressive disorder, recurrent, in partial remission: Secondary | ICD-10-CM

## 2021-10-12 DIAGNOSIS — E7800 Pure hypercholesterolemia, unspecified: Secondary | ICD-10-CM

## 2021-10-12 DIAGNOSIS — I10 Essential (primary) hypertension: Secondary | ICD-10-CM

## 2021-10-12 HISTORY — DX: Sarcoidosis, unspecified: D86.9

## 2021-10-12 LAB — GLUCOSE, POCT (MANUAL RESULT ENTRY): POC Glucose: 153 mg/dl — AB (ref 70–99)

## 2021-10-12 LAB — SEDIMENTATION RATE: Sed Rate: 17 mm/hr (ref 0–30)

## 2021-10-12 MED ORDER — DULAGLUTIDE 3 MG/0.5ML ~~LOC~~ SOAJ
SUBCUTANEOUS | 3 refills | Status: DC
  Filled 2021-10-12 – 2021-11-07 (×2): qty 16, fill #0
  Filled 2021-11-23: qty 8, 112d supply, fill #0
  Filled 2022-01-07: qty 8, 112d supply, fill #1
  Filled 2022-02-24: qty 8, 112d supply, fill #0
  Filled 2022-02-24 – 2022-03-24 (×2): qty 8, 112d supply, fill #1
  Filled ????-??-??: fill #0

## 2021-10-12 MED ORDER — ROSUVASTATIN CALCIUM 5 MG PO TABS
ORAL_TABLET | ORAL | 2 refills | Status: DC
  Filled 2021-10-12: qty 60, fill #0
  Filled 2021-10-28: qty 90, 90d supply, fill #0
  Filled 2022-02-02: qty 90, 90d supply, fill #1
  Filled 2022-02-02: qty 30, 30d supply, fill #0

## 2021-10-12 MED ORDER — PREDNISONE 10 MG PO TABS
10.0000 mg | ORAL_TABLET | Freq: Every day | ORAL | 1 refills | Status: DC
  Filled 2021-10-12: qty 30, 30d supply, fill #0
  Filled 2021-11-08: qty 30, 30d supply, fill #1

## 2021-10-12 MED ORDER — LISINOPRIL 5 MG PO TABS
5.0000 mg | ORAL_TABLET | Freq: Every day | ORAL | 0 refills | Status: DC
  Filled 2021-10-12: qty 30, 30d supply, fill #0

## 2021-10-12 NOTE — Progress Notes (Signed)
Established Patient Office Visit  Subjective:  Patient ID: Renee Bush, female    DOB: 1961-07-07  Age: 61 y.o. MRN: [id number]  CC:  Chief Complaint  Patient presents with   Follow-up    Labs draw 10/06/21   b12 deficiency    Patient to get monthly b12 injection    HPI Renee Bush  is a 61 year old female with history of T2DM, HTN, HLD presents for routine follow up and lab review.  Her HgbA1c done on 10/06/21 increased from 6.8% to 7.3%. She states that she's compliant with her medications, denies side effects and continues to make healthy lifestyle changes. She states that she has not checked her blood glucose for few weeks and it was 153  mg/dl. She denies hypo/hyperglycemic symptoms, peripheral neuropathy is under control with taking gabapentin. Her other labs were unremarkable. She was in the ED on 09/22/21 with symptoms of palpitations . She was found to have a heart rate in the 160s.  She had to be given adenosine along with aspirin and nitroglycerin.  Subsequently her tachycardia resolved and she was asked to increase her metoprolol to 75 mg twice daily and follow-up with cardiology. She denies palpitation and has an appointment with Dr. Saunders Revel in March 2023.  During her admission to the hospital she underwent a CT angio chest to rule out PE given her tachycardia and she was found to have right paratracheal lymph node 20 x 15 mm in size, subcarinal lymph node 17 x 26 mm in size.  Right hilar nodal tissue 6 x 17 mm in size.  No evidence of PE. She had PET Scan done on 09/29/21 and it showed  1. Hypermetabolic lymphadenopathy in both hilar regions in the mediastinum with associated hypermetabolic lymph nodes in the right supraclavicular region and left thoracic inlet. Upper normal portal caval lymph nodes in the abdomen show low level hypermetabolism. Metastatic disease or lymphoproliferative disorder would be primary concerns. Inflammatory etiology such as sarcoidosis is also a  consideration. No definite hypermetabolic primary lesion on today's study. 2.  Aortic Atherosclerois (ICD10-170.0).  She had US guided biopsy to right supraclavicular LN on 10/04/21 by Dr Juliet Rude. She states that she's in a dysphoric mood and anxious about her LN biopsy result, she denies suicidal nor homicidal ideation. Overall, she states that she's concerned about there result, offers no further complaint.  Past Medical History:  Diagnosis Date   Chronic kidney disease, stage III (moderate) ([place]) 10/06/2017   Diabetes mellitus without complication (HCC)    GERD (gastroesophageal reflux disease)    Hypercholesteremia    Hypertension 09/10/2015    Past Surgical History:  Procedure Laterality Date   APPENDECTOMY     CESAREAN SECTION  1991   COLONOSCOPY WITH PROPOFOL N/A 06/03/2019   Procedure: COLONOSCOPY WITH PROPOFOL;  Surgeon: Jonathon Bellows, MD;  Location: [facility] ENDOSCOPY;  Service: Gastroenterology;  Laterality: N/A;    Family History  Problem Relation Age of Onset   COPD Mother    Alzheimer's disease Mother    Hypertension Father    Hyperlipidemia Father    Heart attack Father    Diabetes type II Father    Lupus Father    Diabetes type II Sister    Diabetes type II Sister    Diabetes type II Sister    Gestational diabetes Daughter     Social History   Socioeconomic History   Marital status: Married    Spouse name: Carloyn Manner   Number  of children: 4   Years of education: Not on file   Highest education level: 11th grade  Occupational History   Occupation: na  Tobacco Use   Smoking status: Former    Types: Cigarettes    Quit date: 04/17/2005    Years since quitting: 16.4   Smokeless tobacco: Never  Vaping Use   Vaping Use: Never used  Substance and Sexual Activity   Alcohol use: Not Currently    Alcohol/week: 14.0 standard drinks    Types: 14 Cans of beer per week    Comment: quit ~2020   Drug use: No   Sexual activity: Not Currently  Other Topics Concern   Not  on file  Social History Narrative   Lives in mobile home paid for   Social Determinants of Health   Financial Resource Strain: Not on file  Food Insecurity: No Food Insecurity   Worried About Charity fundraiser in the Last Year: Never true   [place] in the Last Year: Never true  Transportation Needs: No Transportation Needs   Lack of Transportation (Medical): No   Lack of Transportation (Non-Medical): No  Physical Activity: Not on file  Stress: Not on file  Social Connections: Not on file  Intimate Partner Violence: Not on file    Outpatient Medications Prior to Visit  Medication Sig Dispense Refill   aspirin EC 81 MG tablet Take 1 tablet (81 mg total) by mouth daily.     cetirizine (ZYRTEC) 10 MG tablet TAKE ONE TABLET BY MOUTH EVERY DAY 90 tablet 1   cyanocobalamin (,VITAMIN B-12,) 1000 MCG/ML injection INJECT 1 ML INTO THE MUSCLE EVERY 30 DAYS 1 mL 12   DULoxetine (CYMBALTA) 30 MG capsule Take 3 capsules (90 mg total) by mouth once daily. 90 capsule 2   ferrous sulfate (FEROSUL) 325 (65 FE) MG tablet TAKE ONE TABLET BY MOUTH EVERY DAY WITH BREAKFAST. 90 tablet 1   gabapentin (NEURONTIN) 400 MG capsule TAKE ONE CAPSULE BY MOUTH 2 TIMES A DAY AND TAKE 2 CAPSULES BY MOUTH AT BEDTIME 120 capsule 0   hydrOXYzine (ATARAX) 25 MG tablet Take 1 tablet (25 mg total) by mouth 2 (two) times daily as needed. 60 tablet 2   Insulin Glargine (BASAGLAR KWIKPEN) 100 UNIT/ML INJECT 17 UNITS UNDER THE SKIN ONCE DAILY AT BEDTIME. 15 mL 4   Insulin Pen Needle 32G X 6 MM MISC Use as directed. 100 each PRN   Melatonin 10 MG TABS Take 1 tablet by mouth at bedtime.     metFORMIN (GLUCOPHAGE) 500 MG tablet TAKE ONE TABLET BY MOUTH 2 TIMES A DAY 180 tablet 0   Metoprolol Tartrate 75 MG TABS Take 1 tablet by mouth 2 (two) times daily.     montelukast (SINGULAIR) 10 MG tablet TAKE ONE TABLET BY MOUTH ONCE DAILY AT BEDTIME. 90 tablet 0   nitroGLYCERIN (NITROSTAT) 0.4 MG SL tablet DISSOLVE 1 TABLET  UNDER TONGUE AS NEEDED FOR CHEST PAIN EVERY 5 MINUTES FOR MAX OF 3 DOSES IN 15 MINUTES. IF NO RELIEF AFTER FIRST DOSE CALL 911. 25 tablet 0   Omega-3 Fatty Acids (FISH OIL) 1000 MG CAPS Take 1,000 mg by mouth 2 (two) times daily.     omeprazole (PRILOSEC) 20 MG capsule TAKE ONE CAPSULE BY MOUTH 2 TIMES A DAY BEFORE MEALS 60 capsule 3   senna-docusate (SENOKOT-S) 8.6-50 MG tablet Take 1 tablet by mouth once daily. 30 tablet 3   traZODone (DESYREL) 100 MG tablet Take 1  tablet (100 mg total) by mouth once daily at bedtime. 30 tablet 2   Dulaglutide 3 MG/0.5ML SOPN INJECT 3MG INTO THE SKIN ONCE A WEEK AS DIRECTED 16 mL 3   lisinopril (ZESTRIL) 10 MG tablet TAKE ONE TABLET BY MOUTH EVERY DAY 90 tablet 0   rosuvastatin (CRESTOR) 5 MG tablet TAKE ONE TABLET BY MOUTH ONCE DAILY 60 tablet 2   metoprolol tartrate (LOPRESSOR) 50 MG tablet Take 1 tablet (50 mg total) by mouth 2 (two) times daily. 60 tablet 1   No facility-administered medications prior to visit.    Allergies  Allergen Reactions   Etodolac Rash   Penicillin G Rash   Penicillins Rash    Has patient had a PCN reaction causing immediate rash, facial/tongue/throat swelling, SOB or lightheadedness with hypotension: Yes Has patient had a PCN reaction causing severe rash involving mucus membranes or skin necrosis: No Has patient had a PCN reaction that required hospitalization: No Has patient had a PCN reaction occurring within the last 10 years: No If all of the above answers are "NO", then may proceed with Cephalosporin use.    ROS Review of Systems  Constitutional: Negative.   Eyes: Negative.   Respiratory: Negative.    Cardiovascular: Negative.   Endocrine: Negative.   Skin: Negative.   Neurological: Negative.   Psychiatric/Behavioral:  The patient is nervous/anxious.      Objective:    Physical Exam HENT:     Head: Normocephalic and atraumatic.     Mouth/Throat:     Mouth: Mucous membranes are moist.  Eyes:      Extraocular Movements: Extraocular movements intact.     Conjunctiva/sclera: Conjunctivae normal.     Pupils: Pupils are equal, round, and reactive to light.  Cardiovascular:     Rate and Rhythm: Normal rate and regular rhythm.     Pulses: Normal pulses.     Heart sounds: Normal heart sounds.  Pulmonary:     Effort: Pulmonary effort is normal.     Breath sounds: Normal breath sounds.  Skin:    General: Skin is warm.  Neurological:     General: No focal deficit present.     Mental Status: She is alert and oriented to person, place, and time. Mental status is at baseline.  Psychiatric:        Mood and Affect: Mood normal.        Behavior: Behavior normal.        Thought Content: Thought content normal.        Judgment: Judgment normal.    BP 102/69 (BP Location: Right Arm, Patient Position: Sitting, Cuff Size: Normal)    Pulse 89    Temp 97.8 F (36.6 C) (Oral)    Resp 16    Ht _0  (1.575 m)    Wt 163 lb (73.9 kg)    SpO2 95%    BMI 29.81 kg/m  Wt Readings from Last 3 Encounters:  10/12/21 163 lb (73.9 kg)  10/04/21 161 lb (73 kg)  09/24/21 161 lb 1.6 oz (73.1 kg)     Health Maintenance Due  Topic Date Due   HIV Screening  Never done   Hepatitis C Screening  Never done   Zoster Vaccines- Shingrix (1 of 2) Never done   COVID-19 Vaccine (3 - Pfizer risk series) 01/20/2020   OPHTHALMOLOGY EXAM  03/07/2021   FOOT EXAM  07/14/2021    There are no preventive care reminders to display for this patient.  Lab Results  Component Value Date   TSH 0.854 09/22/2021   Lab Results  Component Value Date   WBC 8.6 09/22/2021   HGB 14.0 09/22/2021   HCT 40.9 09/22/2021   MCV 88.9 09/22/2021   PLT 181 09/22/2021   Lab Results  Component Value Date   NA 143 10/06/2021   K 4.4 10/06/2021   CO2 25 10/06/2021   GLUCOSE 148 (H) 10/06/2021   BUN 9 10/06/2021   CREATININE 1.01 (H) 10/06/2021   BILITOT 0.3 10/06/2021   ALKPHOS 60 10/06/2021   AST 24 10/06/2021   ALT 22  10/06/2021   PROT 6.0 10/06/2021   ALBUMIN 4.2 10/06/2021   CALCIUM 9.1 10/06/2021   ANIONGAP 12 09/22/2021   EGFR 63 10/06/2021   Lab Results  Component Value Date   CHOL 163 10/06/2021   Lab Results  Component Value Date   HDL 40 10/06/2021   Lab Results  Component Value Date   LDLCALC 96 10/06/2021   Lab Results  Component Value Date   TRIG 152 (H) 10/06/2021   Lab Results  Component Value Date   CHOLHDL 4.1 10/06/2021   Lab Results  Component Value Date   HGBA1C 7.3 (H) 10/06/2021      Assessment & Plan:   1. Recurrent major depressive disorder, in partial remission ([place]) -She will continue on current medication and will continue to follow up with [facility]. She was advised to call Crisis help line with worsening symptoms.  2. Hypercholesteremia - She will continue on current medication, low fat/cholesterol diet . - rosuvastatin (CRESTOR) 5 MG tablet; TAKE ONE TABLET BY MOUTH ONCE DAILY  Dispense: 60 tablet; Refill: 2  3. Hypertension, unspecified type - Her blood pressure is low, decreased her Lisinopril to 5 mg daily, advised to check blood pressure, DASH diet. - lisinopril (ZESTRIL) 5 MG tablet; Take 1 tablet (5 mg total) by mouth daily.  Dispense: 30 tablet; Refill: 0  4. Type 2 diabetes mellitus with diabetic neuropathy, with long-term current use of insulin (HCC) - Her HgbA1c was 7.3%, and her goal should be less than 7%. She will continue on her current medication, low carb/non concentrated sweet diet, and was encouraged to check her blood glucose. - POCT Glucose (CBG); Future - Dulaglutide 3 MG/0.5ML SOPN; INJECT 3MG INTO THE SKIN ONCE A WEEK AS DIRECTED  Dispense: 16 mL; Refill: 3 - POCT Glucose (CBG)     Follow-up: Return in about 1 month (around 11/09/2021), or if symptoms worsen or fail to improve.    Shaylyn Bawa Jerold Coombe, NP

## 2021-10-12 NOTE — Progress Notes (Signed)
Subjective:    Patient ID: Renee Bush, female    DOB: 1961-08-05, 61 y.o.   MRN: [id number] Patient Care Team: Mechele Claude, FNP as PCP - General (Family Medicine) End, Harrell Gave, MD as PCP - Cardiology (Cardiology) Tyler Pita, MD as Consulting Physician (Pulmonary Disease)  Chief Complaint  Patient presents with   Consult   HPI This is a 61 year old remote smoker with a history as noted below who presents for evaluation of  abnormal CT chest with mediastinal and hilar adenopathy, hypermetabolic lymphadenopathy on PET scan and nonnecrotizing granulomatous inflammation on right supraclavicular lymph node biopsy all consistent with sarcoidosis.  She is kindly referred by Dr. Randa Evens.  This issue started around September 22, 2021 when she went to the emergency room due to chest tightness and palpitations.  She underwent a CT angio chest that was negative for pulmonary embolism however it showed mediastinal and right hilar adenopathy.  Subsequently she had a PET/CT done on 29 September 2021 that showed hypermetabolic lymphadenopathy with both hilar and mediastinal regions involved as well as right supraclavicular region and left thoracic inlet.  The patient was evaluated by oncology and subsequently referred for biopsy of the supraclavicular lymph node on 04 October 2021.  This showed nonnecrotizing granulomas consistent with sarcoidosis.  She is referred here for consideration for EBUS if necessary and or determination of next steps.  The patient has noted that she has had chest pain since August 2022.  She has also had issues with shortness of breath for the last several months prior to this evaluation.  She also notes arthralgias myalgias and arm and shoulder pain bilaterally.  She has not had any fevers, chills or sweats but has noted some weight loss without anorexia.  He also notes dry cough and intermittent wheezing on most days.  She has not had any hemoptysis, no  orthopnea or paroxysmal nocturnal dyspnea.  No lower extremity edema or calf tenderness.  She does not note any rashes.  Does not endorse any other symptomatology.  TEST/EVENTS :  Right supraclavicular lymph node biopsy October 04, 2021 showed nonnecrotizing granulomatous inflammation consistent with sarcoidosis.   CT chest September 22, 2021 negative for PE, mediastinal and right hilar adenopathy small nodes are present at the porta hepatis   PET scan September 29, 2021 showed hypermetabolic lymphadenopathy in both hilar regions in the mediastinal and associated hypermetabolic lymph nodes in the right supraclavicular region and left thoracic inlet upper portal lymph nodes in the abdomen show low-level hypermetabolism   Review of Systems A 10 point review of systems was performed and it is as noted above otherwise negative.  Past Medical History:  Diagnosis Date   Chronic kidney disease, stage III (moderate) ([place]) 10/06/2017   Diabetes mellitus without complication (HCC)    GERD (gastroesophageal reflux disease)    Hypercholesteremia    Hypertension 09/10/2015   Past Surgical History:  Procedure Laterality Date   APPENDECTOMY     CESAREAN SECTION  1991   COLONOSCOPY WITH PROPOFOL N/A 06/03/2019   Procedure: COLONOSCOPY WITH PROPOFOL;  Surgeon: Jonathon Bellows, MD;  Location: [facility] ENDOSCOPY;  Service: Gastroenterology;  Laterality: N/A;   Patient Active Problem List   Diagnosis Date Noted   Constipation 07/13/2021   Urinary frequency 07/02/2020   Antibiotic-induced yeast infection 01/09/2020   Urinary tract infection symptoms 12/18/2019   Balance problem 12/18/2019   Abnormal laboratory test result 09/04/2019   Acute sinusitis 08/15/2019   History of allergy 06/11/2019  Family history of colonic polyps    Bilateral edema of lower extremity 05/16/2019   Generalized anxiety disorder 02/07/2019   Anemia 07/05/2018   Abdominal pain 11/09/2017   Nausea 11/09/2017   Insomnia 10/06/2017    GERD (gastroesophageal reflux disease) 10/06/2017   Chronic kidney disease, stage III (moderate) ([place]) 10/06/2017   B12 deficiency 06/09/2016   Hypercholesteremia 05/05/2016   Type 2 diabetes mellitus with diabetic neuropathy, with long-term current use of insulin ([place]) 09/10/2015   Hypertension 09/10/2015   ETOH abuse 04/18/2015   Fatigue 04/18/2015   Family History  Problem Relation Age of Onset   COPD Mother    Alzheimer's disease Mother    Hypertension Father    Hyperlipidemia Father    Heart attack Father    Diabetes type II Father    Lupus Father    Diabetes type II Sister    Diabetes type II Sister    Diabetes type II Sister    Gestational diabetes Daughter    Social History   Tobacco Use   Smoking status: Former    Types: Cigarettes    Quit date: 04/17/2005    Years since quitting: 16.4   Smokeless tobacco: Never  Substance Use Topics   Alcohol use: Not Currently    Alcohol/week: 14.0 standard drinks    Types: 14 Cans of beer per week    Comment: quit ~2020   Allergies  Allergen Reactions   Etodolac Rash   Penicillin G Rash   Penicillins Rash    Has patient had a PCN reaction causing immediate rash, facial/tongue/throat swelling, SOB or lightheadedness with hypotension: Yes Has patient had a PCN reaction causing severe rash involving mucus membranes or skin necrosis: No Has patient had a PCN reaction that required hospitalization: No Has patient had a PCN reaction occurring within the last 10 years: No If all of the above answers are "NO", then may proceed with Cephalosporin use.   Current Meds  Medication Sig   aspirin EC 81 MG tablet Take 1 tablet (81 mg total) by mouth daily.   cetirizine (ZYRTEC) 10 MG tablet TAKE ONE TABLET BY MOUTH EVERY DAY   cyanocobalamin (,VITAMIN B-12,) 1000 MCG/ML injection INJECT 1 ML INTO THE MUSCLE EVERY 30 DAYS   Dulaglutide 3 MG/0.5ML SOPN INJECT 3MG INTO THE SKIN ONCE A WEEK AS DIRECTED   DULoxetine (CYMBALTA) 30 MG  capsule Take 3 capsules (90 mg total) by mouth once daily.   ferrous sulfate (FEROSUL) 325 (65 FE) MG tablet TAKE ONE TABLET BY MOUTH EVERY DAY WITH BREAKFAST.   gabapentin (NEURONTIN) 400 MG capsule TAKE ONE CAPSULE BY MOUTH 2 TIMES A DAY AND TAKE 2 CAPSULES BY MOUTH AT BEDTIME   hydrOXYzine (ATARAX) 25 MG tablet Take 1 tablet (25 mg total) by mouth 2 (two) times daily as needed.   Insulin Glargine (BASAGLAR KWIKPEN) 100 UNIT/ML INJECT 17 UNITS UNDER THE SKIN ONCE DAILY AT BEDTIME.   Insulin Pen Needle 32G X 6 MM MISC Use as directed.   lisinopril (ZESTRIL) 5 MG tablet Take 1 tablet (5 mg total) by mouth once daily.   Melatonin 10 MG TABS Take 1 tablet by mouth at bedtime.   metFORMIN (GLUCOPHAGE) 500 MG tablet TAKE ONE TABLET BY MOUTH 2 TIMES A DAY   Metoprolol Tartrate 75 MG TABS Take 1 tablet by mouth 2 (two) times daily.   montelukast (SINGULAIR) 10 MG tablet TAKE ONE TABLET BY MOUTH ONCE DAILY AT BEDTIME.   nitroGLYCERIN (NITROSTAT) 0.4 MG SL tablet  DISSOLVE 1 TABLET UNDER TONGUE AS NEEDED FOR CHEST PAIN EVERY 5 MINUTES FOR MAX OF 3 DOSES IN 15 MINUTES. IF NO RELIEF AFTER FIRST DOSE CALL 911.   Omega-3 Fatty Acids (FISH OIL) 1000 MG CAPS Take 1,000 mg by mouth 2 (two) times daily.   omeprazole (PRILOSEC) 20 MG capsule TAKE ONE CAPSULE BY MOUTH 2 TIMES A DAY BEFORE MEALS   rosuvastatin (CRESTOR) 5 MG tablet TAKE ONE TABLET BY MOUTH ONCE DAILY   senna-docusate (SENOKOT-S) 8.6-50 MG tablet Take 1 tablet by mouth once daily.   traZODone (DESYREL) 100 MG tablet Take 1 tablet (100 mg total) by mouth once daily at bedtime.   Immunization History  Administered Date(s) Administered   Influenza,inj,Quad PF,6+ Mos 05/15/2019   Influenza-Unspecified 06/04/2015, 06/10/2016, 06/08/2017, 05/25/2018, 06/01/2020, 05/20/2021   PFIZER(Purple Top)SARS-COV-2 Vaccination 12/02/2019, 12/23/2019   Pneumococcal Polysaccharide-23 05/16/2019   Tdap 06/04/2015       Objective:   Physical Exam BP 98/60 (BP  Location: Left Arm, Patient Position: Sitting, Cuff Size: Normal)   Pulse 97   Temp 97.9 F (36.6 C) (Oral)   Ht 5' 2"$  (1.575 m)   Wt 164 lb 6.4 oz (74.6 kg)   SpO2 96%   BMI 30.07 kg/m  GENERAL: Well-developed, overweight woman, no acute distress, at times appears passive.  No conversational dyspnea, fully ambulatory. HEAD: Normocephalic, atraumatic.  EYES: Pupils equal, round, reactive to light.  No scleral icterus.  MOUTH: Nose/mouth/throat not examined due to institutional masking requirements for COVID-19. NECK: Supple. No thyromegaly. Trachea midline. No JVD.  Mild supraclavicular adenopathy, right. PULMONARY: Good air entry bilaterally.,  Otherwise, no adventitious sounds. CARDIOVASCULAR: S1 and S2. Regular rate and rhythm.  No rubs, murmurs or gallops heard. ABDOMEN: Benign. MUSCULOSKELETAL: No joint deformity, no clubbing, no edema.  NEUROLOGIC: No overt focal deficit, no gait disturbance, speech is fluent. SKIN: Intact,warm,dry. PSYCH: Affect flat, passive behavior.     Assessment & Plan:     ICD-10-CM   1. Sarcoidosis  D86.9 Pulmonary Function Test [facility] Only    Rheumatoid factor    Sedimentation rate    Angiotensin converting enzyme   Currently no need for EBUS Will treat with 10 mg prednisone daily  PFTs Sed rate, ACE level, rheumatoid factor    2. SOB (shortness of breath)  R06.02 Pulmonary Function Test [facility] Only   Likely related to sarcoidosis PFTs should help clarify    3. Arthralgia of multiple joints  M25.50    May be related to sarcoid arthritis Check rheumatoid factor as well    4. Type 2 diabetes mellitus with diabetic neuropathy, with long-term current use of insulin (HCC)  E11.40    Z79.4    This issue adds complexity to her management Will make using steroid challenging to manage her sarcoidosis May need steroid sparing agents     Orders Placed This Encounter  Procedures   Rheumatoid factor    Standing Status:   Future    Standing  Expiration Date:   10/12/2022   Sedimentation rate    Standing Status:   Future    Standing Expiration Date:   10/12/2022   Angiotensin converting enzyme    Standing Status:   Future    Standing Expiration Date:   10/12/2022   Pulmonary Function Test [facility] Only    Standing Status:   Future    Standing Expiration Date:   10/12/2022    Order Specific Question:   Full PFT: includes the following: basic spirometry, spirometry pre &  post bronchodilator, diffusion capacity (DLCO), lung volumes    Answer:   Full PFT    Order Specific Question:   This test can only be performed at    Answer:   [facility] ordered this encounter  Medications   predniSONE (DELTASONE) 10 MG tablet    Sig: Take 1 tablet (10 mg total) by mouth once daily with breakfast.    Dispense:  30 tablet    Refill:  1   The patient has biopsy-proven sarcoidosis.  We will start low-dose prednisone to see if she can tolerate this and it does not affect her glycemic control much.  She has been advised to let her primary care provider check her glycemic control frequently as adjustments may need to be made to her therapy.  We will see the patient in follow-up in 3 to 4 weeks time with either me or the nurse practitioner at that time.  She may need to be started on a steroid sparing medication for control of her sarcoidosis at that time.  Renold Don, MD Advanced Bronchoscopy [facility]    *This note was dictated using voice recognition software/Dragon.  Despite best efforts to proofread, errors can occur which can change the meaning. Any transcriptional errors that result from this process are unintentional and may not be fully corrected at the time of dictation.

## 2021-10-12 NOTE — Patient Instructions (Signed)

## 2021-10-12 NOTE — Progress Notes (Signed)
Vit B12 1063mcg/ml given IM left deltoid, lot# [id number], exp 04/2022. Patient tolerated without incident.

## 2021-10-12 NOTE — Patient Instructions (Signed)
You have sarcoidosis.  We are going to start you on a low-dose of prednisone to see if you tolerate this well.  Make sure you let your primary care provider that you are on prednisone so they can adjust your insulin.  We will see you in follow-up in 3 to 4 weeks time with either me or the nurse practitioner at that time.  You may need to be started on a second line medication at that time.  Order blood work today to determine the activity of your sarcoidosis.

## 2021-10-13 ENCOUNTER — Other Ambulatory Visit: Payer: Self-pay

## 2021-10-13 ENCOUNTER — Other Ambulatory Visit: Payer: Self-pay | Admitting: Emergency Medicine

## 2021-10-13 MED ORDER — METOPROLOL TARTRATE 25 MG PO TABS
75.0000 mg | ORAL_TABLET | Freq: Two times a day (BID) | ORAL | 0 refills | Status: DC
Start: 2021-10-13 — End: 2021-11-02
  Filled 2021-10-13: qty 90, 15d supply, fill #0

## 2021-10-14 ENCOUNTER — Ambulatory Visit: Payer: Self-pay | Admitting: Gerontology

## 2021-10-14 ENCOUNTER — Other Ambulatory Visit: Payer: Self-pay

## 2021-10-14 ENCOUNTER — Encounter: Payer: Self-pay | Admitting: Gerontology

## 2021-10-14 ENCOUNTER — Ambulatory Visit: Payer: Self-pay | Admitting: Licensed Clinical Social Worker

## 2021-10-14 ENCOUNTER — Encounter: Payer: Self-pay | Admitting: Licensed Clinical Social Worker

## 2021-10-14 VITALS — BP 116/79 | HR 85 | Temp 98.1°F | Wt 165.6 lb

## 2021-10-14 DIAGNOSIS — F331 Major depressive disorder, recurrent, moderate: Secondary | ICD-10-CM

## 2021-10-14 DIAGNOSIS — R079 Chest pain, unspecified: Secondary | ICD-10-CM

## 2021-10-14 DIAGNOSIS — F411 Generalized anxiety disorder: Secondary | ICD-10-CM

## 2021-10-14 DIAGNOSIS — E1140 Type 2 diabetes mellitus with diabetic neuropathy, unspecified: Secondary | ICD-10-CM

## 2021-10-14 DIAGNOSIS — Z794 Long term (current) use of insulin: Secondary | ICD-10-CM

## 2021-10-14 HISTORY — DX: Chest pain, unspecified: R07.9

## 2021-10-14 LAB — ANGIOTENSIN CONVERTING ENZYME: Angiotensin-Converting Enzyme: 6 U/L — ABNORMAL LOW (ref 14–82)

## 2021-10-14 LAB — RHEUMATOID FACTOR: Rheumatoid fact SerPl-aCnc: <10 IU/mL (ref <14.0)

## 2021-10-14 MED ORDER — INSULIN LISPRO (1 UNIT DIAL) 100 UNIT/ML (KWIKPEN)
2.0000 [IU] | PEN_INJECTOR | Freq: Three times a day (TID) | SUBCUTANEOUS | 5 refills | Status: DC
  Filled 2021-10-14: qty 10, 28d supply, fill #0
  Filled 2021-10-14: qty 15, 72d supply, fill #0
  Filled 2021-10-14: qty 5, 84d supply, fill #0

## 2021-10-14 NOTE — BH Specialist Note (Signed)
Integrated Behavioral Health via Telemedicine Visit  10/14/2021 EMA HEBNER [id number]  Referring Provider: Carlyon Shadow, NP  Patient/Family location: The Patient's home address [facility] Provider location: [facility] All persons participating in visit: Jelicia Nantz and Jerrilyn Cairo, MSW, LCSW-A Types of Service: Telephone visit  I connected with [name] via  Telephone or Video Enabled Telemedicine Application  (Video is Caregility application) and verified that I am speaking with the correct person using two identifiers. Discussed confidentiality: Yes   I discussed the limitations of telemedicine and the availability of in person appointments.  Discussed there is a possibility of technology failure and discussed alternative modes of communication if that failure occurs.  Patient and/or legal guardian expressed understanding and consented to Telemedicine visit: Yes   Presenting Concerns: Patient and/or family reports the following symptoms/concerns: The patient reports that she has been doing okay since her last follow-up appointment. She explained that she received lab results from her oncologist that indicated she did not have cancer. She shared that she was diagnosed with sarcoidosis. The patient noted she was relieved that she did not have cancer and that the last two weeks of waiting was very hard for her and her family. She stated that she feels cared for and supported by her family currently. She explained that she saw her primary care provider today and was advised to file for medicaid to help her with the medical expenses. Pami noted that while that would be a big relief to have assistance with her medical bills she does not want to leave the [facility]. She discussed other health and financial stressors impacting her life currently. Milca noted that she is looking forward to getting back to her routine and back to exercising  more. The patient denied any suicidal or homicidal thoughts.   Duration of problem: Years; Severity of problem: moderate  Patient and/or Family's Strengths/Protective Factors: Social connections, Concrete supports in place (healthy food, safe environments, etc.), and Sense of purpose  Goals Addressed: Patient will:  Reduce symptoms of: agitation, anxiety, depression, insomnia, and stress   Increase knowledge and/or ability of: coping skills, healthy habits, self-management skills, and stress reduction   Demonstrate ability to: Increase healthy adjustment to current life circumstances and Increase adequate support systems for patient/family  Progress towards Goals: Ongoing  Interventions: Interventions utilized:  CBT Cognitive Behavioral Therapy was utilized by the clinician during today's follow up session. Clinician met with patient to identify needs related to stressors and functioning, and assess and monitor for signs and symptoms of anxiety and depression, and assess safety. The clinician processed with the patient how they have been doing since the last follow-up session. Clinician measured the patient's anxiety and depression on a numerical scale. Clinician encouraged the patient to continue to work on calming techniques (e.g.. paced breathing, deep muscle relaxation, and calming imagery) as a strategy for responding appropriately to anxiety and the urge to avoid situations or self-isolate when they occur and move towards increasing the patients self regulation. Clinician intervened with positive regard and optimism to validate client's emotions, and supported client in exploring ways to access additional supports. The session ended with scheduling.  Standardized Assessments completed: GAD-7 and PHQ 9 GAD-7 = 13 PHQ-9 = 15   Assessment: Patient currently experiencing see above.   Patient may benefit from see above.  Plan: Follow up with behavioral health clinician on : 11/02/21 at  2:00 PM  Behavioral recommendations:  Referral(s): [place] (  In Clinic)  I discussed the assessment and treatment plan with the patient and/or parent/guardian. They were provided an opportunity to ask questions and all were answered. They agreed with the plan and demonstrated an understanding of the instructions.   They were advised to call back or seek an in-person evaluation if the symptoms worsen or if the condition fails to improve as anticipated.  [name], LCSWA

## 2021-10-14 NOTE — Progress Notes (Signed)
This encounter was created in error - please disregard.

## 2021-10-14 NOTE — Progress Notes (Signed)
Hematology/Oncology Consult note [facility]  Telephone:([phone] [phone]  Patient Care Team: Wolfgang Phoenix, NP as PCP - General (Gerontology)   Name of the patient: Renee Bush  [id number]  [birth year]   Date of visit: 10/14/21  Diagnosis-mediastinal adenopathy of unclear etiology  Chief complaint/ Reason for visit-discuss pathology results and further management  Heme/Onc history:  Patient is a 61 year old female who was in the ER on 09/22/2021 with symptoms of palpitations.  She was found to have a heart rate in the 160s.  She had to be given adenosine along with aspirin and nitroglycerin.  Subsequently her tachycardia resolved and she was asked to increase her metoprolol to 75 mg twice daily and follow-up with cardiology.  She has an appointment with Dr. Saunders Revel in March 2023.  During her admission to the hospital she underwent a CT angio chest to rule out PE given her tachycardia and she was found to have right paratracheal lymph node 20 x 15 mm in size, subcarinal lymph node 17 x 26 mm in size.  Right hilar nodal tissue 6 x 17 mm in size.  No evidence of PE.  Findings concerning for neoplasm.     PET scan showed hypermetabolic lymphadenopathy in bilateral mediastinal regions with an SUV of 7.8.  Hypermetabolic lymphadenopathy in the right hilum with an SUV of 8.7 right supraclavicular lymph node with an SUV of 3.9 no hypermetabolic lung mass.  Small portocaval lymph node 7 mm with an SUV of 4  Supraclavicular lymph node biopsy did not show any evidence of malignancy.Minute fragments of lymphoid tissue with nonnecrotizing epithelioid granulomatous inflammation  Interval history-no changes since the last visit.  Patient reports occasional palpitations and ongoing fatigue  ECOG PS- 1 Pain scale- 0   Review of systems- Review of Systems  Constitutional:  Positive for malaise/fatigue. Negative for chills, fever and weight loss.  HENT:   Negative for congestion, ear discharge and nosebleeds.   Eyes:  Negative for blurred vision.  Respiratory:  Negative for cough, hemoptysis, sputum production, shortness of breath and wheezing.   Cardiovascular:  Negative for chest pain, palpitations, orthopnea and claudication.  Gastrointestinal:  Negative for abdominal pain, blood in stool, constipation, diarrhea, heartburn, melena, nausea and vomiting.  Genitourinary:  Negative for dysuria, flank pain, frequency, hematuria and urgency.  Musculoskeletal:  Negative for back pain, joint pain and myalgias.  Skin:  Negative for rash.  Neurological:  Negative for dizziness, tingling, focal weakness, seizures, weakness and headaches.  Endo/Heme/Allergies:  Does not bruise/bleed easily.  Psychiatric/Behavioral:  Negative for depression and suicidal ideas. The patient does not have insomnia.     Allergies  Allergen Reactions   Etodolac Rash   Penicillin G Rash   Penicillins Rash    Has patient had a PCN reaction causing immediate rash, facial/tongue/throat swelling, SOB or lightheadedness with hypotension: Yes Has patient had a PCN reaction causing severe rash involving mucus membranes or skin necrosis: No Has patient had a PCN reaction that required hospitalization: No Has patient had a PCN reaction occurring within the last 10 years: No If all of the above answers are "NO", then may proceed with Cephalosporin use.     Past Medical History:  Diagnosis Date   Chronic kidney disease, stage III (moderate) ([place]) 10/06/2017   Diabetes mellitus without complication (HCC)    GERD (gastroesophageal reflux disease)    Hypercholesteremia    Hypertension 09/10/2015   Sarcoidosis 10/12/2021   self-reported  Past Surgical History:  Procedure Laterality Date   APPENDECTOMY     CESAREAN SECTION  1991   COLONOSCOPY WITH PROPOFOL N/A 06/03/2019   Procedure: COLONOSCOPY WITH PROPOFOL;  Surgeon: Jonathon Bellows, MD;  Location: [facility] ENDOSCOPY;   Service: Gastroenterology;  Laterality: N/A;    Social History   Socioeconomic History   Marital status: Married    Spouse name: Carloyn Manner   Number of children: 4   Years of education: Not on file   Highest education level: 11th grade  Occupational History   Occupation: na  Tobacco Use   Smoking status: Former    Types: Cigarettes    Quit date: 04/17/2005    Years since quitting: 16.5   Smokeless tobacco: Never  Vaping Use   Vaping Use: Never used  Substance and Sexual Activity   Alcohol use: Not Currently    Alcohol/week: 14.0 standard drinks    Types: 14 Cans of beer per week    Comment: quit ~2020   Drug use: No   Sexual activity: Not Currently  Other Topics Concern   Not on file  Social History Narrative   Lives in mobile home paid for   Social Determinants of Health   Financial Resource Strain: Not on file  Food Insecurity: No Food Insecurity   Worried About Charity fundraiser in the Last Year: Never true   [place] in the Last Year: Never true  Transportation Needs: No Transportation Needs   Lack of Transportation (Medical): No   Lack of Transportation (Non-Medical): No  Physical Activity: Not on file  Stress: Not on file  Social Connections: Not on file  Intimate Partner Violence: Not on file    Family History  Problem Relation Age of Onset   COPD Mother    Alzheimer's disease Mother    Hypertension Father    Hyperlipidemia Father    Heart attack Father    Diabetes type II Father    Lupus Father    Diabetes type II Sister    Diabetes type II Sister    Diabetes type II Sister    Gestational diabetes Daughter      Current Outpatient Medications:    aspirin EC 81 MG tablet, Take 1 tablet (81 mg total) by mouth daily., Disp: , Rfl:    cetirizine (ZYRTEC) 10 MG tablet, TAKE ONE TABLET BY MOUTH EVERY DAY, Disp: 90 tablet, Rfl: 1   cyanocobalamin (,VITAMIN B-12,) 1000 MCG/ML injection, INJECT 1 ML INTO THE MUSCLE EVERY 30 DAYS, Disp: 1 mL, Rfl: 12    Dulaglutide 3 MG/0.5ML SOPN, INJECT 3MG  INTO THE SKIN ONCE A WEEK AS DIRECTED, Disp: 16 mL, Rfl: 3   DULoxetine (CYMBALTA) 30 MG capsule, Take 3 capsules (90 mg total) by mouth once daily., Disp: 90 capsule, Rfl: 2   ferrous sulfate (FEROSUL) 325 (65 FE) MG tablet, TAKE ONE TABLET BY MOUTH EVERY DAY WITH BREAKFAST., Disp: 90 tablet, Rfl: 1   gabapentin (NEURONTIN) 400 MG capsule, TAKE ONE CAPSULE BY MOUTH 2 TIMES A DAY AND TAKE 2 CAPSULES BY MOUTH AT BEDTIME, Disp: 120 capsule, Rfl: 0   hydrOXYzine (ATARAX) 25 MG tablet, Take 1 tablet (25 mg total) by mouth 2 (two) times daily as needed., Disp: 60 tablet, Rfl: 2   Insulin Glargine (BASAGLAR KWIKPEN) 100 UNIT/ML, INJECT 17 UNITS UNDER THE SKIN ONCE DAILY AT BEDTIME., Disp: 15 mL, Rfl: 4   Insulin Pen Needle 32G X 6 MM MISC, Use as directed., Disp: 100 each, Rfl:  PRN   lisinopril (ZESTRIL) 5 MG tablet, Take 1 tablet (5 mg total) by mouth once daily., Disp: 30 tablet, Rfl: 0   Melatonin 10 MG TABS, Take 1 tablet by mouth at bedtime., Disp: , Rfl:    metFORMIN (GLUCOPHAGE) 500 MG tablet, TAKE ONE TABLET BY MOUTH 2 TIMES A DAY, Disp: 180 tablet, Rfl: 0   montelukast (SINGULAIR) 10 MG tablet, TAKE ONE TABLET BY MOUTH ONCE DAILY AT BEDTIME., Disp: 90 tablet, Rfl: 0   nitroGLYCERIN (NITROSTAT) 0.4 MG SL tablet, DISSOLVE 1 TABLET UNDER TONGUE AS NEEDED FOR CHEST PAIN EVERY 5 MINUTES FOR MAX OF 3 DOSES IN 15 MINUTES. IF NO RELIEF AFTER FIRST DOSE CALL 911., Disp: 25 tablet, Rfl: 0   Omega-3 Fatty Acids (FISH OIL) 1000 MG CAPS, Take 1,000 mg by mouth 2 (two) times daily., Disp: , Rfl:    omeprazole (PRILOSEC) 20 MG capsule, TAKE ONE CAPSULE BY MOUTH 2 TIMES A DAY BEFORE MEALS, Disp: 60 capsule, Rfl: 3   rosuvastatin (CRESTOR) 5 MG tablet, TAKE ONE TABLET BY MOUTH ONCE DAILY, Disp: 60 tablet, Rfl: 2   senna-docusate (SENOKOT-S) 8.6-50 MG tablet, Take 1 tablet by mouth once daily., Disp: 30 tablet, Rfl: 3   traZODone (DESYREL) 100 MG tablet, Take 1 tablet (100  mg total) by mouth once daily at bedtime., Disp: 30 tablet, Rfl: 2   insulin lispro (HUMALOG) 100 UNIT/ML injection, Inject 0.02 mLs (2 Units total) into the skin 3 (three) times daily with meals. Blood sugar 200 mg add 1 unit,  251-300 add 2 units 301-350 add 3 units 351-400 add 4 units Over 401 add 5 units, recheck in 2 hours and call clinic, Disp: 5 mL, Rfl: 5   metoprolol tartrate (LOPRESSOR) 25 MG tablet, Take 3 tablets (75 mg total) by mouth 2 (two) times daily., Disp: 90 tablet, Rfl: 0   predniSONE (DELTASONE) 10 MG tablet, Take 1 tablet (10 mg total) by mouth once daily with breakfast., Disp: 30 tablet, Rfl: 1  Physical exam:  Vitals:   10/12/21 1447  BP: 100/69  Pulse: 97  Temp: (!) 96.8 F (36 C)  TempSrc: Tympanic  SpO2: 95%  Weight: 164 lb 8 oz (74.6 kg)  Height: 5\' 2"  (1.575 m)   Physical Exam Constitutional:      General: She is not in acute distress. Cardiovascular:     Rate and Rhythm: Normal rate and regular rhythm.     Heart sounds: Normal heart sounds.  Pulmonary:     Effort: Pulmonary effort is normal.  Skin:    General: Skin is warm and dry.  Neurological:     Mental Status: She is alert and oriented to person, place, and time.     CMP Latest Ref Rng & Units 10/06/2021  Glucose 70 - 99 mg/dL 148(H)  BUN 8 - 27 mg/dL 9  Creatinine 0.57 - 1.00 mg/dL 1.01(H)  Sodium 134 - 144 mmol/L 143  Potassium 3.5 - 5.2 mmol/L 4.4  Chloride 96 - 106 mmol/L 102  CO2 20 - 29 mmol/L 25  Calcium 8.7 - 10.3 mg/dL 9.1  Total Protein 6.0 - 8.5 g/dL 6.0  Total Bilirubin 0.0 - 1.2 mg/dL 0.3  Alkaline Phos 44 - 121 IU/L 60  AST 0 - 40 IU/L 24  ALT 0 - 32 IU/L 22   CBC Latest Ref Rng & Units 09/22/2021  WBC 4.0 - 10.5 K/uL 8.6  Hemoglobin 12.0 - 15.0 g/dL 14.0  Hematocrit 36.0 - 46.0 % 40.9  Platelets  150 - 400 K/uL 181    No images are attached to the encounter.  DG Chest 2 View  Result Date: 09/22/2021 CLINICAL DATA:  SVT EXAM: CHEST - 2 VIEW COMPARISON:  08/10/2019  FINDINGS: The heart size and mediastinal contours are within normal limits. Atherosclerotic calcification of the aortic knob. Both lungs are clear. The visualized skeletal structures are unremarkable. IMPRESSION: No active cardiopulmonary disease. Electronically Signed   By: Davina Poke D.O.   On: 09/22/2021 14:23   CT Angio Chest PE W and/or Wo Contrast  Result Date: 09/22/2021 CLINICAL DATA:  Pulmonary embolism suspected, positive D-dimer. Tachycardia. Palpitations. Chest tightness. EXAM: CT ANGIOGRAPHY CHEST WITH CONTRAST TECHNIQUE: Multidetector CT imaging of the chest was performed using the standard protocol during bolus administration of intravenous contrast. Multiplanar CT image reconstructions and MIPs were obtained to evaluate the vascular anatomy. RADIATION DOSE REDUCTION: This exam was performed according to the departmental dose-optimization program which includes automated exposure control, adjustment of the mA and/or kV according to patient size and/or use of iterative reconstruction technique. CONTRAST:  46mL OMNIPAQUE IOHEXOL 350 MG/ML SOLN COMPARISON:  One view chest x-ray 09/22/2021. CT chest with contrast 09/26/2007 FINDINGS: Cardiovascular: Heart size is normal. Coronary artery calcifications are noted. Atherosclerotic changes are present at the aortic arch without aneurysm or definite stenosis. Pulmonary artery opacification is excellent. No focal filling defects are present. Pulmonary artery size is normal. Mediastinum/Nodes: Paratracheal lymph nodes are noted. A right paratracheal node measures 20 x 15 mm. Pretracheal lymph node scratched at a precarinal lymph node measures 9 mm in short access. Subcarinal lymph node measures 17 x 26 mm. Right hilar nodal tissue is present. Largest area is 6 x 17 mm. No significant axillary adenopathy is present. Lungs/Pleura: No focal nodule, mass, or airspace disease is present. No significant pleural disease is present. No pneumothorax is present.  Upper Abdomen: Subcentimeter nodes are present at porta hepatis. No focal solid organ lesions are present. Atherosclerotic changes are present in the aorta without aneurysm. Musculoskeletal: No chest wall abnormality. No acute or significant osseous findings. Review of the MIP images confirms the above findings. IMPRESSION: 1. No pulmonary embolus. 2. Mediastinal and right hilar adenopathy. Small nodes are also present at the porta hepatis. This raises concern for neoplasm. Recommend CT of the abdomen pelvis with contrast for further evaluation on a nonemergent basis. 3. Coronary artery disease. 4. Aortic Atherosclerosis (ICD10-I70.0). These results were called by telephone at the time of interpretation on 09/22/2021 at 4:49 pm to provider [facility] , who verbally acknowledged these results. Electronically Signed   By: San Morelle M.D.   On: 09/22/2021 16:52   NM PET Image Initial (PI) Skull Base To Thigh  Result Date: 09/29/2021 CLINICAL DATA:  Initial treatment strategy for lymphadenopathy. EXAM: NUCLEAR MEDICINE PET SKULL BASE TO THIGH TECHNIQUE: 8.7 mCi F-18 FDG was injected intravenously. Full-ring PET imaging was performed from the skull base to thigh after the radiotracer. CT data was obtained and used for attenuation correction and anatomic localization. Fasting blood glucose: 157 mg/dl COMPARISON:  CTA Chest 09/22/2021 FINDINGS: Mediastinal blood pool activity: SUV max 2.9 Liver activity: SUV max NA NECK: 10 mm short axis right supraclavicular node on [id number] is hypermetabolic with SUV max = 3.9. No other hypermetabolic cervical lymphadenopathy. Incidental CT findings: none CHEST: Previously identified mediastinal lymphadenopathy is hypermetabolic. Index 17 mm short axis right paratracheal node on 77/3 demonstrates SUV max = 7.8. Hypermetabolic pre-vascular and left thoracic inlet lymphadenopathy evident. Hypermetabolic lymphadenopathy in the  right hilum demonstrates SUV max = 8.7. 13 mm short axis  subcarinal lymph node is hypermetabolic with SUV max = 9.6. Hypermetabolic lymphadenopathy noted in the left hilum. No hypermetabolic pulmonary nodule or mass. Incidental CT findings: No lung consolidation.  No pleural effusion. ABDOMEN/PELVIS: No abnormal hypermetabolic activity within the liver, pancreas, adrenal glands, or spleen. Small portal caval lymph nodes measuring about 7 mm short axis on image 137/3 shows low level hypermetabolism with SUV max = 4. No other hypermetabolic lymphadenopathy in the abdomen or pelvis. Diffuse FDG accumulation is identified in the stomach, small bowel, and colon likely physiologic. Incidental CT findings: Tiny duodenal diverticulum noted. Diverticular changes are noted in the left colon without diverticulitis. There is moderate atherosclerotic calcification of the abdominal aorta without aneurysm. SKELETON: No focal hypermetabolic activity to suggest skeletal metastasis. No worrisome lytic or sclerotic osseous abnormality. Incidental CT findings: none IMPRESSION: 1. Hypermetabolic lymphadenopathy in both hilar regions in the mediastinum with associated hypermetabolic lymph nodes in the right supraclavicular region and left thoracic inlet. Upper normal portal caval lymph nodes in the abdomen show low level hypermetabolism. Metastatic disease or lymphoproliferative disorder would be primary concerns. Inflammatory etiology such as sarcoidosis is also a consideration. No definite hypermetabolic primary lesion on today's study. 2.  Aortic Atherosclerois (ICD10-170.0) Electronically Signed   By: Misty Stanley M.D.   On: 09/29/2021 12:29   DG Chest Portable 1 View  Result Date: 09/22/2021 CLINICAL DATA:  Chest pain.  Palpitations. EXAM: PORTABLE CHEST 1 VIEW COMPARISON:  Radiograph earlier today, 1-1/2 hours prior. FINDINGS: The cardiomediastinal contours are normal. The lungs are clear. Pulmonary vasculature is normal. No consolidation, pleural effusion, or pneumothorax. No acute  osseous abnormalities are seen. IMPRESSION: No acute chest findings. Electronically Signed   By: Keith Rake M.D.   On: 09/22/2021 15:29   [place] CORE BIOPSY (LYMPH NODES)  Result Date: 10/04/2021 INDICATION: right supraclavicular lymphadenopathy EXAM: Ultrasound-guided core needle biopsy of right supraclavicular lymph node MEDICATIONS: None. ANESTHESIA/SEDATION: Moderate (conscious) sedation was employed during this procedure. A total of Versed 1 mg and Fentanyl 50 mcg was administered intravenously. Moderate Sedation Time: 10 minutes. The patient's level of consciousness and vital signs were monitored continuously by radiology nursing throughout the procedure under my direct supervision. FLUOROSCOPY TIME:  N/a COMPLICATIONS: None immediate. PROCEDURE: Informed written consent was obtained from the patient after a thorough discussion of the procedural risks, benefits and alternatives. All questions were addressed. Maximal Sterile Barrier Technique was utilized including caps, mask, sterile gowns, sterile gloves, sterile drape, hand hygiene and skin antiseptic. A timeout was performed prior to the initiation of the procedure. The patient was placed supine on the exam table. Limited ultrasound of right neck and supraclavicular region was performed, again demonstrating an enlarged right supraclavicular lymph node compatible with recent cross-sectional imaging. Skin entry site was marked, and the overlying skin was prepped and draped in a standard sterile fashion. Local analgesia was obtained with 1% lidocaine. Under ultrasound guidance, core needle biopsy of the lymph node was performed using an 18 gauge core biopsy device x6 passes. Specimens were submitted in saline to pathology for further handling. Limited postprocedure imaging demonstrated no hematoma or complicating feature. A clean dressing was placed after hemostasis. The patient tolerated the procedure well without immediate complication. IMPRESSION:  Successful ultrasound-guided core needle biopsy of enlarged right supraclavicular lymph node. Electronically Signed   By: Albin Felling M.D.   On: 10/04/2021 15:44     Assessment and plan- Patient is a 61  y.o. female with mediastinal adenopathy of unclear etiology  I have reviewed PET/CT scan images and discussed findings with the patient which shows evidence of hypermetabolic lymph nodes in the bilateral mediastinum with an SUV that ranges between 7-8.  She also had a mildly hypermetabolic left supraclavicular lymph node which was biopsied and was nondiagnostic for malignancy.  However given that mediastinal adenopathy is significant with a reasonable SUV uptake I have discussed her case with Dr. Patsey Berthold who has agreed to see her and evaluate her for possible bronchoscopy.  She will be seeing her later today.  I will see her after bronchoscopy results are back   Visit Diagnosis 1. Mediastinal adenopathy      Dr. Randa Evens, MD, MPH [facility] [id number] 10/14/2021 11:07 AM

## 2021-10-14 NOTE — Progress Notes (Signed)
Established Patient Office Visit  Subjective:  Patient ID: Renee Bush, female    DOB: 06/13/1961  Age: 61 y.o. MRN: [id number]  CC:  Chief Complaint  Patient presents with   Chest Pain    Pt reports upper chest pain at rest and during activity; pt reports pain level 6 out of 10 with level sometimes increasing with activity.    HPI Renee Bush  is a 61 year old female with history of T2DM, HTN, HLD , Sarcoidosis, presents for medication adjustment and c/o chest pain. She was seen by the Pulmonologist Dr Patsey Berthold C on 10/12/21 where she was diagnosed with Sarcoidosis and was started on low dose prednisone 10 mg daily. She c/o  intermittent non productive cough that started last week. She also c/o constant chest pain to the middle of her chest. She describes pain as sharp that radiates to her right arm and feels like it's piercing through her back.  She states that pain is 6/10 and intensity increases with activity. She states that pain intensity decreases with rest. She denies any aggravating factor, and has not taking any medication. She denies nausea, vomiting, light headedness. She states that she's compliant with her medication, denies side effects and per Dr Patsey Berthold, requests insulin adjustment due to daily prednisone. Overall, she states that she's taking it one day at a time after her new diagnosis, and she offers no further complaint.   Past Medical History:  Diagnosis Date   Chronic kidney disease, stage III (moderate) ([place]) 10/06/2017   Diabetes mellitus without complication (HCC)    GERD (gastroesophageal reflux disease)    Hypercholesteremia    Hypertension 09/10/2015   Sarcoidosis 10/12/2021   self-reported    Past Surgical History:  Procedure Laterality Date   APPENDECTOMY     CESAREAN SECTION  1991   COLONOSCOPY WITH PROPOFOL N/A 06/03/2019   Procedure: COLONOSCOPY WITH PROPOFOL;  Surgeon: Jonathon Bellows, MD;  Location: [facility] ENDOSCOPY;  Service:  Gastroenterology;  Laterality: N/A;    Family History  Problem Relation Age of Onset   COPD Mother    Alzheimer's disease Mother    Hypertension Father    Hyperlipidemia Father    Heart attack Father    Diabetes type II Father    Lupus Father    Diabetes type II Sister    Diabetes type II Sister    Diabetes type II Sister    Gestational diabetes Daughter     Social History   Socioeconomic History   Marital status: Married    Spouse name: Carloyn Manner   Number of children: 4   Years of education: Not on file   Highest education level: 11th grade  Occupational History   Occupation: na  Tobacco Use   Smoking status: Former    Types: Cigarettes    Quit date: 04/17/2005    Years since quitting: 16.5   Smokeless tobacco: Never  Vaping Use   Vaping Use: Never used  Substance and Sexual Activity   Alcohol use: Not Currently    Alcohol/week: 14.0 standard drinks    Types: 14 Cans of beer per week    Comment: quit ~2020   Drug use: No   Sexual activity: Not Currently  Other Topics Concern   Not on file  Social History Narrative   Lives in mobile home paid for   Social Determinants of Health   Financial Resource Strain: Not on file  Food Insecurity: No Food Insecurity   Worried About Running Out  of Food in the Last Year: Never true   [place] in the Last Year: Never true  Transportation Needs: No Transportation Needs   Lack of Transportation (Medical): No   Lack of Transportation (Non-Medical): No  Physical Activity: Not on file  Stress: Not on file  Social Connections: Not on file  Intimate Partner Violence: Not on file    Outpatient Medications Prior to Visit  Medication Sig Dispense Refill   aspirin EC 81 MG tablet Take 1 tablet (81 mg total) by mouth daily.     cetirizine (ZYRTEC) 10 MG tablet TAKE ONE TABLET BY MOUTH EVERY DAY 90 tablet 1   cyanocobalamin (,VITAMIN B-12,) 1000 MCG/ML injection INJECT 1 ML INTO THE MUSCLE EVERY 30 DAYS 1 mL 12   Dulaglutide 3  MG/0.5ML SOPN INJECT 3MG INTO THE SKIN ONCE A WEEK AS DIRECTED 16 mL 3   DULoxetine (CYMBALTA) 30 MG capsule Take 3 capsules (90 mg total) by mouth once daily. 90 capsule 2   ferrous sulfate (FEROSUL) 325 (65 FE) MG tablet TAKE ONE TABLET BY MOUTH EVERY DAY WITH BREAKFAST. 90 tablet 1   gabapentin (NEURONTIN) 400 MG capsule TAKE ONE CAPSULE BY MOUTH 2 TIMES A DAY AND TAKE 2 CAPSULES BY MOUTH AT BEDTIME 120 capsule 0   hydrOXYzine (ATARAX) 25 MG tablet Take 1 tablet (25 mg total) by mouth 2 (two) times daily as needed. 60 tablet 2   Insulin Glargine (BASAGLAR KWIKPEN) 100 UNIT/ML INJECT 17 UNITS UNDER THE SKIN ONCE DAILY AT BEDTIME. 15 mL 4   Insulin Pen Needle 32G X 6 MM MISC Use as directed. 100 each PRN   lisinopril (ZESTRIL) 5 MG tablet Take 1 tablet (5 mg total) by mouth once daily. 30 tablet 0   Melatonin 10 MG TABS Take 1 tablet by mouth at bedtime.     metFORMIN (GLUCOPHAGE) 500 MG tablet TAKE ONE TABLET BY MOUTH 2 TIMES A DAY 180 tablet 0   metoprolol tartrate (LOPRESSOR) 25 MG tablet Take 3 tablets (75 mg total) by mouth 2 (two) times daily. 90 tablet 0   montelukast (SINGULAIR) 10 MG tablet TAKE ONE TABLET BY MOUTH ONCE DAILY AT BEDTIME. 90 tablet 0   nitroGLYCERIN (NITROSTAT) 0.4 MG SL tablet DISSOLVE 1 TABLET UNDER TONGUE AS NEEDED FOR CHEST PAIN EVERY 5 MINUTES FOR MAX OF 3 DOSES IN 15 MINUTES. IF NO RELIEF AFTER FIRST DOSE CALL 911. 25 tablet 0   Omega-3 Fatty Acids (FISH OIL) 1000 MG CAPS Take 1,000 mg by mouth 2 (two) times daily.     omeprazole (PRILOSEC) 20 MG capsule TAKE ONE CAPSULE BY MOUTH 2 TIMES A DAY BEFORE MEALS 60 capsule 3   predniSONE (DELTASONE) 10 MG tablet Take 1 tablet (10 mg total) by mouth once daily with breakfast. 30 tablet 1   rosuvastatin (CRESTOR) 5 MG tablet TAKE ONE TABLET BY MOUTH ONCE DAILY 60 tablet 2   senna-docusate (SENOKOT-S) 8.6-50 MG tablet Take 1 tablet by mouth once daily. 30 tablet 3   traZODone (DESYREL) 100 MG tablet Take 1 tablet (100 mg  total) by mouth once daily at bedtime. 30 tablet 2   No facility-administered medications prior to visit.    Allergies  Allergen Reactions   Etodolac Rash   Penicillin G Rash   Penicillins Rash    Has patient had a PCN reaction causing immediate rash, facial/tongue/throat swelling, SOB or lightheadedness with hypotension: Yes Has patient had a PCN reaction causing severe rash involving mucus membranes or  skin necrosis: No Has patient had a PCN reaction that required hospitalization: No Has patient had a PCN reaction occurring within the last 10 years: No If all of the above answers are "NO", then may proceed with Cephalosporin use.    ROS Review of Systems  Constitutional: Negative.   Eyes: Negative.   Respiratory: Negative.         Chest pain  Cardiovascular: Negative.   Endocrine: Negative.   Neurological: Negative.   Psychiatric/Behavioral:  The patient is nervous/anxious.      Objective:    Physical Exam HENT:     Head: Normocephalic.  Cardiovascular:     Rate and Rhythm: Normal rate and regular rhythm.  Pulmonary:     Effort: Pulmonary effort is normal.     Breath sounds: Normal breath sounds.  Chest:     Chest wall: No mass, deformity, tenderness, crepitus or edema.  Abdominal:     General: Bowel sounds are normal.     Palpations: Abdomen is soft.  Musculoskeletal:        General: Normal range of motion.     Cervical back: Normal range of motion.  Skin:    General: Skin is warm.  Neurological:     General: No focal deficit present.     Mental Status: She is alert and oriented to person, place, and time.  Psychiatric:        Mood and Affect: Mood normal.        Behavior: Behavior normal.    BP 116/79 (BP Location: Right Arm, Patient Position: Sitting, Cuff Size: Normal)    Pulse 85    Temp 98.1 F (36.7 C) (Oral)    Wt 165 lb 9.6 oz (75.1 kg)    SpO2 95%    BMI 30.29 kg/m  Wt Readings from Last 3 Encounters:  10/14/21 165 lb 9.6 oz (75.1 kg)   10/12/21 164 lb 6.4 oz (74.6 kg)  10/12/21 164 lb 8 oz (74.6 kg)     Health Maintenance Due  Topic Date Due   HIV Screening  Never done   Hepatitis C Screening  Never done   Zoster Vaccines- Shingrix (1 of 2) Never done   OPHTHALMOLOGY EXAM  03/07/2021   FOOT EXAM  07/14/2021    There are no preventive care reminders to display for this patient.  Lab Results  Component Value Date   TSH 0.854 09/22/2021   Lab Results  Component Value Date   WBC 8.6 09/22/2021   HGB 14.0 09/22/2021   HCT 40.9 09/22/2021   MCV 88.9 09/22/2021   PLT 181 09/22/2021   Lab Results  Component Value Date   NA 143 10/06/2021   K 4.4 10/06/2021   CO2 25 10/06/2021   GLUCOSE 148 (H) 10/06/2021   BUN 9 10/06/2021   CREATININE 1.01 (H) 10/06/2021   BILITOT 0.3 10/06/2021   ALKPHOS 60 10/06/2021   AST 24 10/06/2021   ALT 22 10/06/2021   PROT 6.0 10/06/2021   ALBUMIN 4.2 10/06/2021   CALCIUM 9.1 10/06/2021   ANIONGAP 12 09/22/2021   EGFR 63 10/06/2021   Lab Results  Component Value Date   CHOL 163 10/06/2021   Lab Results  Component Value Date   HDL 40 10/06/2021   Lab Results  Component Value Date   LDLCALC 96 10/06/2021   Lab Results  Component Value Date   TRIG 152 (H) 10/06/2021   Lab Results  Component Value Date   CHOLHDL 4.1 10/06/2021   Lab  Results  Component Value Date   HGBA1C 7.3 (H) 10/06/2021      Assessment & Plan:    1. Type 2 diabetes mellitus with diabetic neuropathy, with long-term current use of insulin (HCC) - Her HgbA1c was 7.3%, due to daily prednisone intake, she was started on Humalog 2 units with each meal plus sliding scale.  She was advised to check her blood glucose 3 times a day, record and bring log to follow up appointment. - insulin lispro (HUMALOG) 100 UNIT/ML injection; Inject 0.02 mLs (2 Units total) into the skin 3 (three) times daily with meals. If blood sugar is 200 add 1 unit,  251-300 add 2 units 301-350 add 3 units 351-400 add  4 units Over 401 add 5 units, recheck in 2 hours and call clinic  Dispense: 5 mL; Refill: 5  2. Chest pain, unspecified type -She declined ED visit, states that she will monitor chest pain at knows when to go to the emergency room if symptoms worsens.  3. Generalized anxiety disorder -She was educated on sarcoidosis and prednisone and was advised to notify clinic with any questions.  She follows up at [facility] behavioral health and was advised to call the crisis helpline with worsening symptoms.     Follow-up: Return in about 5 weeks (around 11/16/2021), or if symptoms worsen or fail to improve.    Komal Stangelo Jerold Coombe, NP

## 2021-10-14 NOTE — Patient Instructions (Signed)

## 2021-10-21 ENCOUNTER — Other Ambulatory Visit: Payer: Self-pay

## 2021-10-21 ENCOUNTER — Other Ambulatory Visit: Payer: Self-pay | Admitting: Gerontology

## 2021-10-21 DIAGNOSIS — E1140 Type 2 diabetes mellitus with diabetic neuropathy, unspecified: Secondary | ICD-10-CM

## 2021-10-21 DIAGNOSIS — Z794 Long term (current) use of insulin: Secondary | ICD-10-CM

## 2021-10-22 ENCOUNTER — Other Ambulatory Visit: Payer: Self-pay

## 2021-10-25 ENCOUNTER — Other Ambulatory Visit: Payer: Self-pay

## 2021-10-26 ENCOUNTER — Other Ambulatory Visit: Payer: Self-pay

## 2021-10-26 MED ORDER — GABAPENTIN 400 MG PO CAPS
ORAL_CAPSULE | ORAL | 0 refills | Status: DC
  Filled 2021-10-26: qty 120, 30d supply, fill #0

## 2021-10-27 ENCOUNTER — Other Ambulatory Visit
Admission: RE | Admit: 2021-10-27 | Discharge: 2021-10-27 | Disposition: A | Discharge status: Home / Self Care | Payer: Medicaid Other | Source: Ambulatory Visit | Attending: Pulmonary Disease | Admitting: Pulmonary Disease

## 2021-10-27 ENCOUNTER — Ambulatory Visit: Payer: Self-pay

## 2021-10-27 ENCOUNTER — Ambulatory Visit (INDEPENDENT_AMBULATORY_CARE_PROVIDER_SITE_OTHER): Payer: Self-pay | Admitting: Internal Medicine

## 2021-10-27 ENCOUNTER — Other Ambulatory Visit: Payer: Self-pay

## 2021-10-27 ENCOUNTER — Encounter: Payer: Self-pay | Admitting: Internal Medicine

## 2021-10-27 VITALS — BP 120/70 | HR 84 | Ht 62.0 in | Wt 166.0 lb

## 2021-10-27 DIAGNOSIS — R002 Palpitations: Secondary | ICD-10-CM

## 2021-10-27 DIAGNOSIS — I10 Essential (primary) hypertension: Secondary | ICD-10-CM

## 2021-10-27 DIAGNOSIS — I471 Supraventricular tachycardia: Secondary | ICD-10-CM

## 2021-10-27 DIAGNOSIS — Z01812 Encounter for preprocedural laboratory examination: Secondary | ICD-10-CM | POA: Diagnosis present

## 2021-10-27 DIAGNOSIS — E785 Hyperlipidemia, unspecified: Secondary | ICD-10-CM

## 2021-10-27 DIAGNOSIS — Z20822 Contact with and (suspected) exposure to covid-19: Secondary | ICD-10-CM | POA: Diagnosis not present

## 2021-10-27 DIAGNOSIS — R0602 Shortness of breath: Secondary | ICD-10-CM

## 2021-10-27 DIAGNOSIS — E1169 Type 2 diabetes mellitus with other specified complication: Secondary | ICD-10-CM

## 2021-10-27 DIAGNOSIS — R079 Chest pain, unspecified: Secondary | ICD-10-CM

## 2021-10-27 NOTE — Patient Instructions (Signed)
Medication Instructions:  ? ?Your physician recommends that you continue on your current medications as directed. Please refer to the Current Medication list given to you today. ? ?*If you need a refill on your cardiac medications before your next appointment, please call your pharmacy* ? ? ?Lab Work: ? ?None ordered ? ?Testing/Procedures: ? ?Your physician has requested that you have an echocardiogram. Echocardiography is a painless test that uses sound waves to create images of your heart. It provides your doctor with information about the size and shape of your heart and how well your heart?s chambers and valves are working. This procedure takes approximately one hour. There are no restrictions for this procedure. ? ? ?Follow-Up: ?At [facility], you and your health needs are our priority.  As part of our continuing mission to provide you with exceptional heart care, we have created designated Provider Care Teams.  These Care Teams include your primary Cardiologist (physician) and Advanced Practice Providers (APPs -  Physician Assistants and Nurse Practitioners) who all work together to provide you with the care you need, when you need it. ? ?We recommend signing up for the patient portal called "MyChart".  Sign up information is provided on this After Visit Summary.  MyChart is used to connect with patients for Virtual Visits (Telemedicine).  Patients are able to view lab/test results, encounter notes, upcoming appointments, etc.  Non-urgent messages can be sent to your provider as well.   ?To learn more about what you can do with MyChart, go to [email or web address].   ? ?Your next appointment:   ?6 week(s) ? ?The format for your next appointment:   ?In Person ? ?Provider:   ?You may see Dr. Cristal Deer End or one of the following Advanced Practice Providers on your designated Care Team:   ?Nicolasa Ducking, NP ?Eula Listen, PA-C ?Cadence Fransico Michael, PA-C{ ? ?

## 2021-10-27 NOTE — Progress Notes (Signed)
New Outpatient Visit Date: 10/27/2021  Referring Provider: Dorothea GlassmanPaul Malinda, MD [facility] Emergency Department  Chief Complaint: Palpitations  HPI:  [name] is a 61 y.o. female who is being seen today for the evaluation of supraventricular tachycardia at the request of Dr. Darnelle CatalanMalinda. She has a history of hypertension, hyperlipidemia, type 2 diabetes mellitus, GERD, and recently diagnosed lymphadenopathy suspicious for sarcoidosis.  [facility] department on 09/22/2021 with sudden onset of palpitations while watching television.  EMS was summoned and found her to be tachycardic with a heart rate around 160 bpm.  She was given adenosine with resultant decrease in heart rate down to 120 bpm.  She was initially asymptomatic when she arrived in the ED EKG showed sinus tachycardia) but later complained of chest pain and shortness of breath.  D-dimer was elevated though CTA was negative for PE.  However, mediastinal and right hilar lymphadenopathy was incidentally noted as well as coronary artery calcification and aortic atherosclerosis.  Supraclavicular lymph node biopsy was not diagnostic for malignancy.  Findings were suspicious for sarcoidosis, prompting referral to pulmonology.  She was started on prednisone.  On the day of her ED visit in late January, [name] was watching television when she suddenly felt her heart began to race.  She checked her pulse and noted to be around 160 bpm.  She felt very short of breath and also weak all over.  She describes a discomfort in her chest as if she had been kicked while having the palpitations.  After the sensations did not resolve over the course of 30-45 minutes, she called 911.  She received 2 doses of adenosine; first dose did not think the second 1 slowed her heart rate down some and led to resolution of chest pain and improvement in her shortness of breath.  She was still little dyspneic when she got to the ED.  Since that time, she has  had "some good days and some bad days) when her chest feels a little bit feet.  She has not had any further palpitations or dyspnea.  She chronically sleeps on 2 pillows and also endorses occasional PND in the remote past.  She has not had any edema.  Since her ED visit, she has been taking metoprolol tartrate 75 mg twice daily, which was increased from 50 mg twice daily by the ED provider.  --------------------------------------------------------------------------------------------------  Cardiovascular History & Procedures: Cardiovascular Problems: Possible SVT  Risk Factors: Hypertension, hyperlipidemia, type 2 diabetes mellitus, coronary artery calcification, and prior tobacco use  Cath/PCI: None  CV Surgery: None  EP Procedures and Devices: None  Non-Invasive Evaluation(s): None  Recent CV Pertinent Labs: Lab Results  Component Value Date   CHOL 163 10/06/2021   CHOL 176 03/03/2014   HDL 40 10/06/2021   HDL 46 03/03/2014   LDLCALC 96 10/06/2021   LDLCALC 100 03/03/2014   TRIG 152 (H) 10/06/2021   TRIG 151 03/03/2014   CHOLHDL 4.1 10/06/2021   INR 0.94 06/04/2015   INR 1.0 03/02/2014   BNP 20.7 09/22/2021   K 4.4 10/06/2021   K 3.7 03/02/2014   BUN 9 10/06/2021   BUN 16 03/02/2014   CREATININE 1.01 (H) 10/06/2021   CREATININE 0.98 03/02/2014    --------------------------------------------------------------------------------------------------  Past Medical History:  Diagnosis Date   Chronic kidney disease, stage III (moderate) (HCC) 10/06/2017   Diabetes mellitus without complication (HCC)    GERD (gastroesophageal reflux disease)    Hypercholesteremia    Hypertension 09/10/2015  Sarcoidosis 10/12/2021   self-reported    Past Surgical History:  Procedure Laterality Date   APPENDECTOMY     CESAREAN SECTION  1991   COLONOSCOPY WITH PROPOFOL N/A 06/03/2019   Procedure: COLONOSCOPY WITH PROPOFOL;  Surgeon: Jonathon Bellows, MD;  Location: [facility] ENDOSCOPY;   Service: Gastroenterology;  Laterality: N/A;    Current Meds  Medication Sig   aspirin EC 81 MG tablet Take 1 tablet (81 mg total) by mouth daily.   cetirizine (ZYRTEC) 10 MG tablet TAKE ONE TABLET BY MOUTH EVERY DAY   cyanocobalamin (,VITAMIN B-12,) 1000 MCG/ML injection INJECT 1 ML INTO THE MUSCLE EVERY 30 DAYS   Dulaglutide 3 MG/0.5ML SOPN INJECT 3MG  INTO THE SKIN ONCE A WEEK AS DIRECTED   DULoxetine (CYMBALTA) 30 MG capsule Take 3 capsules (90 mg total) by mouth once daily.   ferrous sulfate (FEROSUL) 325 (65 FE) MG tablet TAKE ONE TABLET BY MOUTH EVERY DAY WITH BREAKFAST.   gabapentin (NEURONTIN) 400 MG capsule TAKE ONE CAPSULE BY MOUTH 2 TIMES A DAY AND TAKE 2 CAPSULES BY MOUTH AT BEDTIME   hydrOXYzine (ATARAX) 25 MG tablet Take 1 tablet (25 mg total) by mouth 2 (two) times daily as needed.   Insulin Glargine (BASAGLAR KWIKPEN) 100 UNIT/ML INJECT 17 UNITS UNDER THE SKIN ONCE DAILY AT BEDTIME.   insulin lispro (HUMALOG KWIKPEN) 100 UNIT/ML KwikPen Inject 0.02 mLs (2 Units total) into the skin 3 (three) times daily with meals. If blood sugar is 200 add 1 unit,  251-300 add 2 units 301-350 add 3 units 351-400 add 4 units Over 401 add 5 units, recheck in 2 hours and call clinic   Insulin Pen Needle 32G X 6 MM MISC Use as directed.   lisinopril (ZESTRIL) 5 MG tablet Take 1 tablet (5 mg total) by mouth once daily.   Melatonin 10 MG TABS Take 1 tablet by mouth at bedtime.   metFORMIN (GLUCOPHAGE) 500 MG tablet TAKE ONE TABLET BY MOUTH 2 TIMES A DAY   metoprolol tartrate (LOPRESSOR) 25 MG tablet Take 3 tablets (75 mg total) by mouth 2 (two) times daily.   montelukast (SINGULAIR) 10 MG tablet TAKE ONE TABLET BY MOUTH ONCE DAILY AT BEDTIME.   nitroGLYCERIN (NITROSTAT) 0.4 MG SL tablet DISSOLVE 1 TABLET UNDER TONGUE AS NEEDED FOR CHEST PAIN EVERY 5 MINUTES FOR MAX OF 3 DOSES IN 15 MINUTES. IF NO RELIEF AFTER FIRST DOSE CALL 911.   Omega-3 Fatty Acids (FISH OIL) 1000 MG CAPS Take 1,000 mg by  mouth 2 (two) times daily.   omeprazole (PRILOSEC) 20 MG capsule TAKE ONE CAPSULE BY MOUTH 2 TIMES A DAY BEFORE MEALS   predniSONE (DELTASONE) 10 MG tablet Take 1 tablet (10 mg total) by mouth once daily with breakfast.   rosuvastatin (CRESTOR) 5 MG tablet TAKE ONE TABLET BY MOUTH ONCE DAILY   senna-docusate (SENOKOT-S) 8.6-50 MG tablet Take 1 tablet by mouth once daily.   traZODone (DESYREL) 100 MG tablet Take 1 tablet (100 mg total) by mouth once daily at bedtime.    Allergies: Etodolac, Penicillin g, and Penicillins  Social History   Tobacco Use   Smoking status: Former    Types: Cigarettes    Quit date: 04/17/2005    Years since quitting: 16.5   Smokeless tobacco: Never  Vaping Use   Vaping Use: Never used  Substance Use Topics   Alcohol use: Not Currently    Alcohol/week: 14.0 standard drinks    Types: 14 Cans of beer per week  Comment: quit ~2020   Drug use: No    Family History  Problem Relation Age of Onset   COPD Mother    Alzheimer's disease Mother    Hypertension Father    Hyperlipidemia Father    Heart attack Father    Diabetes type II Father    Lupus Father    Diabetes type II Sister    Diabetes type II Sister    Diabetes type II Sister    Diabetes type II Sister    Gestational diabetes Daughter     Review of Systems: A 12-system review of systems was performed and was negative except as noted in the HPI.  --------------------------------------------------------------------------------------------------  Physical Exam: BP 120/70 (BP Location: Right Arm, Patient Position: Sitting, Cuff Size: Normal)    Pulse 84    Ht 5\' 2"  (1.575 m)    Wt 166 lb (75.3 kg)    SpO2 98%    BMI 30.36 kg/m   General: NAD.  Accompanied by her daughter. HEENT: No conjunctival pallor or scleral icterus. Facemask in place. Neck: Supple without lymphadenopathy, thyromegaly, JVD, or HJR. No carotid bruit. Lungs: Normal work of breathing. Clear to auscultation bilaterally  without wheezes or crackles. Heart: Regular rate and rhythm without murmurs, rubs, or gallops. Non-displaced PMI. Abd: Bowel sounds present. Soft, NT/ND without hepatosplenomegaly Ext: No lower extremity edema. Radial, PT, and DP pulses are 2+ bilaterally Skin: Warm and dry without rash. Neuro: CNIII-XII intact. Strength and fine-touch sensation intact in upper and lower extremities bilaterally. Psych: Normal mood and affect.  EKG: Normal sinus rhythm with poor R wave progression, which may reflect lead placement.  Heart rate has decreased from prior tracing on 09/22/2021..  Lab Results  Component Value Date   WBC 8.6 09/22/2021   HGB 14.0 09/22/2021   HCT 40.9 09/22/2021   MCV 88.9 09/22/2021   PLT 181 09/22/2021    Lab Results  Component Value Date   NA 143 10/06/2021   K 4.4 10/06/2021   CL 102 10/06/2021   CO2 25 10/06/2021   BUN 9 10/06/2021   CREATININE 1.01 (H) 10/06/2021   GLUCOSE 148 (H) 10/06/2021   ALT 22 10/06/2021    Lab Results  Component Value Date   CHOL 163 10/06/2021   HDL 40 10/06/2021   LDLCALC 96 10/06/2021   TRIG 152 (H) 10/06/2021   CHOLHDL 4.1 10/06/2021     --------------------------------------------------------------------------------------------------  ASSESSMENT AND PLAN: Palpitations and supraventricular tachycardia: I have reviewed rhythm strips and EKGs from EMS on 09/22/2021, which are most consistent with SVT, though I cannot exclude atypical flutter with 2:1 AV block.  The fact that the arrhythmia broke with adenosine suggest that it was SVT.  Ms. Bertling has not had any further episodes following escalation of metoprolol tartrate to 75 mg twice daily.  We will continue this medication and obtain an echocardiogram to exclude structural abnormalities.  If she were to have recurrent symptomatic SVT, we would consider referral to EP to discuss antiarrhythmic therapy versus EP study/ablation.  Shortness of breath and chest pain: Symptoms  occurred in the setting of tachycardia.  High-sensitivity troponin I was negative x2.  EKG shows poor R wave progression, which may reflect a lead placement given that it was normal at the time of her ED visit in January.  Given her multiple cardiac risk factors, I think it would be reasonable to pursue ischemia evaluation.  We will determine the best modality based on the results of her echocardiogram.  Hypertension: Blood  pressure well controlled today.  Continue current medications.  Hyperlipidemia associated with type 2 diabetes mellitus: Lipids reasonable on last check a month ago.  Continue rosuvastatin 5 mg daily; escalation to high intensity regimen will need to be considered if there is evidence of significant ASCVD on upcoming testing.  Ongoing management of DM per the patient's PCP.  Follow-up: Return to clinic in 6 weeks.  [name], MD 10/27/2021 3:01 PM

## 2021-10-28 ENCOUNTER — Other Ambulatory Visit: Payer: Self-pay | Admitting: Gerontology

## 2021-10-28 ENCOUNTER — Ambulatory Visit: Payer: Medicaid Other | Attending: Pulmonary Disease

## 2021-10-28 ENCOUNTER — Encounter: Payer: Self-pay | Admitting: Internal Medicine

## 2021-10-28 DIAGNOSIS — F3341 Major depressive disorder, recurrent, in partial remission: Secondary | ICD-10-CM

## 2021-10-28 DIAGNOSIS — R002 Palpitations: Secondary | ICD-10-CM | POA: Insufficient documentation

## 2021-10-28 DIAGNOSIS — I471 Supraventricular tachycardia, unspecified: Secondary | ICD-10-CM | POA: Insufficient documentation

## 2021-10-28 DIAGNOSIS — D869 Sarcoidosis, unspecified: Secondary | ICD-10-CM | POA: Diagnosis present

## 2021-10-28 DIAGNOSIS — R0602 Shortness of breath: Secondary | ICD-10-CM | POA: Diagnosis not present

## 2021-10-28 DIAGNOSIS — F411 Generalized anxiety disorder: Secondary | ICD-10-CM

## 2021-10-28 HISTORY — DX: Palpitations: R00.2

## 2021-10-28 HISTORY — DX: Shortness of breath: R06.02

## 2021-10-28 LAB — SARS CORONAVIRUS 2 (TAT 6-24 HRS): SARS Coronavirus 2: NEGATIVE

## 2021-10-28 MED ORDER — ALBUTEROL SULFATE (2.5 MG/3ML) 0.083% IN NEBU
2.5000 mg | INHALATION_SOLUTION | Freq: Once | RESPIRATORY_TRACT | Status: DC
  Filled 2021-10-28: qty 3

## 2021-10-29 ENCOUNTER — Other Ambulatory Visit: Payer: Self-pay

## 2021-10-29 ENCOUNTER — Ambulatory Visit (INDEPENDENT_AMBULATORY_CARE_PROVIDER_SITE_OTHER): Payer: Self-pay

## 2021-10-29 DIAGNOSIS — I471 Supraventricular tachycardia: Secondary | ICD-10-CM

## 2021-10-29 LAB — ECHOCARDIOGRAM COMPLETE
AR max vel: 2.29 cm2
AV Area VTI: 2.51 cm2
AV Area mean vel: 2.06 cm2
AV Mean grad: 2.5 mmHg
AV Peak grad: 4.8 mmHg
Ao pk vel: 1.1 m/s
Area-P 1/2: 3.19 cm2
Calc EF: 65.1 %
S' Lateral: 2.9 cm
Single Plane A2C EF: 67.5 %
Single Plane A4C EF: 62.2 %

## 2021-11-02 ENCOUNTER — Other Ambulatory Visit: Payer: Self-pay

## 2021-11-02 ENCOUNTER — Other Ambulatory Visit: Payer: Self-pay | Admitting: Gerontology

## 2021-11-02 ENCOUNTER — Ambulatory Visit: Payer: Self-pay | Admitting: Licensed Clinical Social Worker

## 2021-11-02 DIAGNOSIS — F411 Generalized anxiety disorder: Secondary | ICD-10-CM

## 2021-11-02 DIAGNOSIS — F3341 Major depressive disorder, recurrent, in partial remission: Secondary | ICD-10-CM

## 2021-11-02 MED FILL — Duloxetine HCl Enteric Coated Pellets Cap 30 MG (Base Eq): ORAL | 30 days supply | Qty: 90 | Fill #0 | Status: AC

## 2021-11-02 MED FILL — Metoprolol Tartrate Tab 25 MG: ORAL | 30 days supply | Qty: 180 | Fill #0 | Status: AC

## 2021-11-02 MED FILL — Hydroxyzine HCl Tab 25 MG: ORAL | 30 days supply | Qty: 60 | Fill #0 | Status: AC

## 2021-11-02 MED FILL — Trazodone HCl Tab 100 MG: ORAL | 30 days supply | Qty: 30 | Fill #0 | Status: AC

## 2021-11-03 ENCOUNTER — Telehealth: Payer: Self-pay | Admitting: Internal Medicine

## 2021-11-03 DIAGNOSIS — Z01812 Encounter for preprocedural laboratory examination: Secondary | ICD-10-CM

## 2021-11-03 DIAGNOSIS — R0602 Shortness of breath: Secondary | ICD-10-CM

## 2021-11-03 DIAGNOSIS — R072 Precordial pain: Secondary | ICD-10-CM

## 2021-11-03 NOTE — Telephone Encounter (Signed)
I spoke with Renee Bush regarding results of her echocardiogram from last week, which showed normal LVEF with grade 1 diastolic dysfunction.  Borderline pulmonary hypertension, and mild-moderate TR.  I do not believe the symptoms explain her palpitations or persistent shortness of breath.  I have recommended noninvasive ischemia evaluation.  We discussed myocardial perfusion stress testing and coronary CTA and have agreed to move forward with coronary CTA, assuming we can achieve a heart rate around 60 bpm.  I will have our office reach out to help coordinate this.  Renee Bush should continue her current medications for the time being, with plans to follow-up as previously scheduled on 12/10/2021. ? ?Renee Bush, MD ?[facility] ? ?

## 2021-11-04 NOTE — Telephone Encounter (Addendum)
Spoke to pt, reviewed instructions for coronary CTA listed below.  ?Pt has been scheduled at [facility] 11/15/21 at 8 AM.  ?Pt scheduled for BMET in our office 11/08/21 at 1015.  ?Pt voiced understanding and has no further questions.  ?I have also sent instructions to pt via mychart.  ? ?Your cardiac CT has been scheduled Monday 11/15/21 at 8:00 AM at the below location:  ? ?[facility] ?[address] ?[address] ?[place], [place] [address] ?([phone] ? ?Please arrive 15 mins early for check-in and test prep. ? ?Please follow these instructions carefully (unless otherwise directed): ? ? ?On the Night Before the Test: ? ?Be sure to Drink plenty of water. ?Do not consume any caffeinated/decaffeinated beverages or chocolate 12 hours prior to your test. ?Do not take any antihistamines 12 hours prior to your test. ? ? ?On the Day of the Test: ? ?Drink plenty of water until 1 hour prior to the test. ?Do not eat any food 4 hours prior to the test. ?DO NOT take Metformin or insulin the morning of procedure. ?You may take remaining medications not listed above prior to the test.  ?Take metoprolol (Lopressor) 150 mg (6 tablets of current dose 25 mg tablet) two hours prior to test. ?FEMALES- please wear underwire-free bra if available, avoid dresses & tight clothing ? ?     ?After the Test: ?Drink plenty of water. ?After receiving IV contrast, you may experience a mild flushed feeling. This is normal. ?On occasion, you may experience a mild rash up to 24 hours after the test. This is not dangerous. If this occurs, you can take Benadryl 25 mg and increase your fluid intake. ?If you experience trouble breathing, this can be serious. If it is severe call 911 IMMEDIATELY. If it is mild, please call our office. ?If you take any of these medications: Glipizide/Metformin, Avandament, Glucavance, please do not take 48 hours after completing test unless otherwise instructed. ? ? ?For non-scheduling  related questions, please contact the cardiac imaging nurse navigator should you have any questions/concerns: ?Rockwell Alexandria, Cardiac Imaging Nurse Navigator ?Larey Brick, Cardiac Imaging Nurse Navigator ?[facility] ?Direct Office Dial: [phone]  ? ?For scheduling needs, including cancellations and rescheduling, please call [place], [phone]. ? ?

## 2021-11-04 NOTE — Addendum Note (Signed)
Addended by: Lanny Hurst on: 11/04/2021 09:43 AM ? ? Modules accepted: Orders ? ?

## 2021-11-07 MED FILL — Cyanocobalamin Inj 1000 MCG/ML: INTRAMUSCULAR | 30 days supply | Qty: 1 | Fill #10 | Status: AC

## 2021-11-08 ENCOUNTER — Other Ambulatory Visit: Payer: Self-pay

## 2021-11-08 ENCOUNTER — Other Ambulatory Visit (INDEPENDENT_AMBULATORY_CARE_PROVIDER_SITE_OTHER): Payer: Self-pay

## 2021-11-08 DIAGNOSIS — R072 Precordial pain: Secondary | ICD-10-CM

## 2021-11-08 DIAGNOSIS — Z01812 Encounter for preprocedural laboratory examination: Secondary | ICD-10-CM

## 2021-11-08 DIAGNOSIS — R0602 Shortness of breath: Secondary | ICD-10-CM

## 2021-11-09 ENCOUNTER — Ambulatory Visit: Payer: Self-pay | Admitting: Licensed Clinical Social Worker

## 2021-11-09 ENCOUNTER — Ambulatory Visit: Payer: Self-pay | Admitting: Gerontology

## 2021-11-09 DIAGNOSIS — F331 Major depressive disorder, recurrent, moderate: Secondary | ICD-10-CM

## 2021-11-09 DIAGNOSIS — F411 Generalized anxiety disorder: Secondary | ICD-10-CM

## 2021-11-09 LAB — BASIC METABOLIC PANEL
BUN/Creatinine Ratio: 21 (ref 12–28)
BUN: 23 mg/dL (ref 8–27)
CO2: 23 mmol/L (ref 20–29)
Calcium: 8.9 mg/dL (ref 8.7–10.3)
Chloride: 105 mmol/L (ref 96–106)
Creatinine, Ser: 1.11 mg/dL — ABNORMAL HIGH (ref 0.57–1.00)
Glucose: 142 mg/dL — ABNORMAL HIGH (ref 70–99)
Potassium: 4.3 mmol/L (ref 3.5–5.2)
Sodium: 141 mmol/L (ref 134–144)
eGFR: 57 mL/min/{1.73_m2} — ABNORMAL LOW (ref >59)

## 2021-11-09 NOTE — BH Specialist Note (Signed)
Integrated Behavioral Health via Telemedicine Visit ? ?11/09/2021 ?Renee Bush ?[id number] ? ?Number of [place] Clinician visits: No data recorded ?Session Start time: No data recorded  ?Session End time: No data recorded ?Total time in minutes: No data recorded ? ?Referring Provider: Carlyon Shadow NP ?Patient/Family location: The patient's home ?[facility] Provider location: [facility] ?All persons participating in visit: Renee Bush. Renee Bush and Renee Bush, MSW, LCSW-A ?Types of Service: Telephone visit ? ?I connected with Renee Bush via Telephone or Video Enabled Telemedicine Application  (Video is Caregility application) and verified that I am speaking with the correct person using two identifiers. Discussed confidentiality: Yes  ? ?I discussed the limitations of telemedicine and the availability of in person appointments.  Discussed there is a possibility of technology failure and discussed alternative modes of communication if that failure occurs. ? ?Patient and/or legal guardian expressed understanding and consented to Telemedicine visit: Yes  ? ?Presenting Concerns: ?Patient and/or family reports the following symptoms/concerns: The patient reports that she is doing about the same since her last follow-up appointment. She noted that she was diagnosed with sarcoidosis and that she believes it is a lung disease. The patient reports that she has not been feeling well lately and is fatigued. She stated that she is not sure if her Cymbalta is working anymore. She explained that she has been on the same dose for many years. The patient described increased health stressors that are impacting her life currently. She shared that she cries frequently throughout her day for no reason. The patient noted that she  struggles with memory and concentration and frequently misplaced things in her home. Renee Bush shared that she feels anxious more days than not and   worries about her health, family, and things she hears on the news everyday. Renee Bush discussed financial stressors impacting her life currently. The patient noted that she feels supported by her husband, friends, and family. The patient denied any suicidal or homicidal thoughts.  ?Duration of problem: years; Severity of problem: moderate ? ?Patient and/or Family's Strengths/Protective Factors: ?Social connections, Concrete supports in place (healthy food, safe environments, etc.), and Sense of purpose ? ?Goals Addressed: ?Patient will: ? Reduce symptoms of: agitation, anxiety, depression, insomnia, and stress  ? Increase knowledge and/or ability of: coping skills, healthy habits, self-management skills, and stress reduction  ? Demonstrate ability to: Increase healthy adjustment to current life circumstances and Increase adequate support systems for patient/family ? ?Progress towards Goals: ?Ongoing ? ?Interventions: ?Interventions utilized:  Supportive Counseling was utilized by the clinician focusing on the patient's stress during today's follow up session. Clinician met with patient to identify needs related to stressors and functioning, and assess and monitor for signs and symptoms of anxiety and depression. The clinician processed with the patient how they have been doing since the last follow-up session. Clinician measured the patient's anxiety and depression on a numerical scale. Clinician met with patient to identify needs related to stressors and functioning, and assess and monitor for signs and symptoms of anxiety and depression, and assess safety. The clinician processed with the patient how they have been doing since the last follow-up session. Clinician utilized reflective listening throughout the session and validated the patient's feelings and concerns regarding her current health issues. Clinician offered to take the patient's case for consultation with Dr. Royston Bake, MD physiatric consultant and  Renee Shadow, NP on Thursday 11/18/2021 at 10:00 AM.  ?Standardized Assessments completed: GAD-7 and PHQ 9 ?GAD-7 =  17 ?PHQ-9 = 18 ? ?Assessment: ?Patient currently experiencing see above.  ? ?Patient may benefit from see above. ? ?Plan: ?Follow up with behavioral health clinician on : 11/23/2021 at 2:00 PM  ?Behavioral recommendations:  ?Referral(s): [place] (In Clinic) ? ?I discussed the assessment and treatment plan with the patient and/or parent/guardian. They were provided an opportunity to ask questions and all were answered. They agreed with the plan and demonstrated an understanding of the instructions. ?  ?They were advised to call back or seek an in-person evaluation if the symptoms worsen or if the condition fails to improve as anticipated. ? ?Renee Bush, LCSWA ?

## 2021-11-10 ENCOUNTER — Telehealth (HOSPITAL_COMMUNITY): Payer: Self-pay | Admitting: Emergency Medicine

## 2021-11-10 NOTE — Telephone Encounter (Signed)
Reaching out to patient to offer assistance regarding upcoming cardiac imaging study; pt verbalizes understanding of appt date/time, parking situation and where to check in, pre-test NPO status and medications ordered, and verified current allergies; name and call back number provided for further questions should they arise ?Rockwell Alexandria RN Navigator Cardiac Imaging ?[facility] ?[phone] office ?[phone] cell ? ?Difficult IV ?Arrival 1215 ?150mg  metoprolol tart per End, C, MD ?

## 2021-11-11 ENCOUNTER — Other Ambulatory Visit (HOSPITAL_COMMUNITY): Payer: Self-pay | Admitting: Emergency Medicine

## 2021-11-11 ENCOUNTER — Other Ambulatory Visit: Payer: Self-pay

## 2021-11-11 ENCOUNTER — Ambulatory Visit (HOSPITAL_COMMUNITY)
Admission: RE | Admit: 2021-11-11 | Discharge: 2021-11-11 | Disposition: A | Discharge status: Home / Self Care | Payer: Medicaid Other | Source: Ambulatory Visit | Attending: Internal Medicine | Admitting: Internal Medicine

## 2021-11-11 ENCOUNTER — Telehealth (HOSPITAL_COMMUNITY): Payer: Self-pay | Admitting: Emergency Medicine

## 2021-11-11 DIAGNOSIS — R072 Precordial pain: Secondary | ICD-10-CM | POA: Insufficient documentation

## 2021-11-11 MED ORDER — METOPROLOL TARTRATE 100 MG PO TABS
100.0000 mg | ORAL_TABLET | Freq: Once | ORAL | 0 refills | Status: DC
  Filled 2021-11-11: qty 1, 1d supply, fill #0

## 2021-11-11 MED ORDER — DILTIAZEM HCL 25 MG/5ML IV SOLN
10.0000 mg | Freq: Once | INTRAVENOUS | Status: AC
  Administered 2021-11-11: 10 mg via INTRAVENOUS

## 2021-11-11 MED ORDER — IOHEXOL 350 MG/ML SOLN: 75.0000 mL | Freq: Once | INTRAVENOUS | Status: DC | PRN

## 2021-11-11 MED ORDER — IVABRADINE HCL 5 MG PO TABS: 15.0000 mg | ORAL_TABLET | Freq: Once | ORAL | 0 refills | Status: AC

## 2021-11-11 MED ORDER — METOPROLOL TARTRATE 5 MG/5ML IV SOLN
10.0000 mg | Freq: Once | INTRAVENOUS | Status: AC
  Administered 2021-11-11: 10 mg via INTRAVENOUS

## 2021-11-11 MED ORDER — METOPROLOL TARTRATE 5 MG/5ML IV SOLN
10.0000 mg | Freq: Once | INTRAVENOUS | Status: AC
Start: 2021-11-11 — End: 2021-11-11
  Administered 2021-11-11: 10 mg via INTRAVENOUS

## 2021-11-11 MED ORDER — NITROGLYCERIN 0.4 MG SL SUBL
0.8000 mg | SUBLINGUAL_TABLET | Freq: Once | SUBLINGUAL | Status: DC
Start: 2021-11-11 — End: 2021-11-12

## 2021-11-11 MED ORDER — IVABRADINE HCL 5 MG PO TABS
15.0000 mg | ORAL_TABLET | Freq: Once | ORAL | 0 refills | Status: DC
  Filled 2021-11-11: qty 3, 1d supply, fill #0

## 2021-11-11 MED ORDER — METOPROLOL TARTRATE 100 MG PO TABS: 100.0000 mg | ORAL_TABLET | Freq: Once | ORAL | 0 refills | Status: DC

## 2021-11-11 NOTE — Telephone Encounter (Signed)
I called the patient and told her cash price for metoprolol and ivabradine would run her about $30 and shes okay to spend that. I will resend both meds to [facility] near her house. I told her she would take all meds 2 hr prior to scan and someone would be calling her to r/s her CT appt and I would be calling to review CT instructions so shes prepared ? ?Marchia Bond RN Navigator Cardiac Imaging ?[facility] ?[phone] Office  ?[phone] Cell ? ?

## 2021-11-11 NOTE — Progress Notes (Signed)
Patient arrived for Cardiac CT scan. Prior to arrival took Metoprolol 150 mg PO. Patient heart rate in high 70's and low 80's. Administered Metoprolol 20 mg IVP and Cardizem 20 mg IVP with little to no change. Notified Dr Myriam Forehand Sandie Ano who ordered patient to be rescheduled at [facility] with pre medication Metoprolol 100 mg PO and Ivabradine 10 mg PO. Notified patient to expect a phone call with reschedule date and information. Verbalized understanding of plan of care.   ?

## 2021-11-12 ENCOUNTER — Ambulatory Visit (INDEPENDENT_AMBULATORY_CARE_PROVIDER_SITE_OTHER): Payer: Self-pay | Admitting: Adult Health

## 2021-11-12 ENCOUNTER — Encounter: Payer: Self-pay | Admitting: Adult Health

## 2021-11-12 ENCOUNTER — Other Ambulatory Visit: Payer: Self-pay

## 2021-11-12 DIAGNOSIS — Z794 Long term (current) use of insulin: Secondary | ICD-10-CM

## 2021-11-12 DIAGNOSIS — D869 Sarcoidosis, unspecified: Secondary | ICD-10-CM

## 2021-11-12 DIAGNOSIS — E1140 Type 2 diabetes mellitus with diabetic neuropathy, unspecified: Secondary | ICD-10-CM

## 2021-11-12 DIAGNOSIS — I471 Supraventricular tachycardia: Secondary | ICD-10-CM

## 2021-11-12 MED ORDER — PREDNISONE 10 MG PO TABS
ORAL_TABLET | ORAL | 0 refills | Status: DC
  Filled 2021-11-12: qty 60, fill #0
  Filled 2021-12-22: qty 4, 14d supply, fill #0

## 2021-11-12 NOTE — Assessment & Plan Note (Addendum)
CT chest with mediastinal and right hilar adenopathy, hypermetabolic lymphadenopathy on PET scan.  Ultrasound-guided biopsy of the right supraclavicular lymph node showing nonnecrotizing granulomatous inflammation all consistent with probable underlying sarcoidosis. ?Patient has been started on low-dose prednisone .  CT chest shows no parenchymal involvement.  PFTs show normal lung function and diffusing capacity.  2D echo with preserved EF.  Sed rate and ACE level are normal. ?We will slowly taper prednisone over the next 6 to 8 weeks to off. ?Consider reevaluation with CT scan at that time to determine lymph node involvement.  If unable to remain off of steroids will need to consider steroid sparing treatment options. ?Long discussion with patient regarding yearly eye exams ?Continue follow-up with cardiology ?Continue follow-up with primary care for ongoing diabetes management as she is on steroids. ?Would consider changing ACE inhibitor as patient has daily cough. ?Add albuterol inhaler as needed ? ?Plan  ?Patient Instructions  ?Continue on Prednisone 10mg  daily for 2 weeks and then 5mg  daily for 4 weeks and then 5mg  every other day for 2 weeks and stop .  ?Albuterol inhaler 1-2 puffs every 6hr as needed wheezing /shortness of breath.  ?Discuss with your doctor that Lisinopril may aggravate your cough and may need to changed.  ?Yearly eye exams as discussed  ?Follow up with Dr. in 2 months and As needed   ?Please contact office for sooner follow up if symptoms do not improve or worsen or seek emergency care  ? ?  ? ?

## 2021-11-12 NOTE — Patient Instructions (Addendum)
Continue on Prednisone 10mg  daily for 2 weeks and then 5mg  daily for 4 weeks and then 5mg  every other day for 2 weeks and stop .  ?Albuterol inhaler 1-2 puffs every 6hr as needed wheezing /shortness of breath.  ?Discuss with your doctor that Lisinopril may aggravate your cough and may need to changed.  ?Yearly eye exams as discussed  ?Follow up with Dr. in 2 months and As needed   ?Please contact office for sooner follow up if symptoms do not improve or worsen or seek emergency care  ? ?

## 2021-11-12 NOTE — Assessment & Plan Note (Signed)
Continue follow-up with primary care patient education given on steroid use ?

## 2021-11-12 NOTE — Progress Notes (Signed)
? ?@Patient  ID: Renee Bush, female    DOB: 1961/02/10, 61 y.o.   MRN: [id number] ? ?Chief Complaint  ?Patient presents with  ? Follow-up  ? ? ?Referring provider: ?Rodolph Bong A, NP ? ?HPI: ?61 year old female seen for pulmonary consult October 12, 2021 for abnormal CT chest with mediastinal and hilar adenopathy, hypermetabolic lymphadenopathy on PET scan and nonnecrotizing granulomatous inflammation on right supraclavicular lymph node biopsy all consistent with sarcoidosis ? ?TEST/EVENTS :  ?Right supraclavicular lymph node biopsy October 04, 2021 showed nonnecrotizing granulomatous inflammation ? ?CT chest September 22, 2021 negative for PE, mediastinal and right hilar adenopathy small nodes are present at the porta hepatis,  ? ?PET scan September 29, 2021 showed hypermetabolic lymphadenopathy in both hilar regions in the mediastinal and associated hypermetabolic lymph nodes in the right supraclavicular region and left thoracic inlet upper portal lymph nodes in the abdomen show low-level hypermetabolism ? ?2D echo October 29, 2021 showed EF at 123456, grade 1 diastolic dysfunction, normal pulmonary artery systolic pressure, normal right ventricular size. ? ?PFTs on October 28, 2021 showed normal lung function with mild bronchodilator response ?FEV1 101%, ratio 82, FVC 95%, 10% bronchodilator change, DLCO 104%. ? ?11/12/2021 Follow up ; Sarcoid  ?Patient presents for a 1 month follow-up.  Patient was seen last visit for pulmonary consult.  Patient developed chest tightness and palpitations. Went to ER with SVT HR at 160bpm.  Underwent a CT angio chest September 22, 2021 that was negative for pulmonary embolism.  However showed mediastinal and right hilar adenopathy.  Subsequent PET scan done on September 29, 2021 showed hypermetabolic lymphadenopathy in both hilar regions and mediastinal along with the right supraclavicular region and left thoracic inlet.  Patient underwent a ultrasound-guided biopsy of the right  supraclavicular lymph node on October 04, 2021 that returned showing nonnecrotizing granulomatous inflammation consistent with sarcoidosis.  She was set up for pulmonary function testing completed on March 2 that showed normal lung function.  No airflow obstruction or restriction.  Normal diffusing capacity.  Patient has been seen by cardiology for her SVT.  Has been set up for an echo that was done on March 3 that showed normal EF and grade 1 diastolic dysfunction.  Normal pulmonary artery pressure. ?Patient was started on prednisone 10 mg daily.  Patient does have underlying insulin-dependent diabetes.  She was advised to talk to her primary care/endocrinologist regarding steroids. ?Since last visit patient is feeling about the same.  Patient says she has low energy decreased activity tolerance and intermittent shortness of breath with activities this seems to have been going on for a long time.  She also has some joint discomforts.  Patient does have insulin-dependent diabetes says that her blood sugars have been up and down.  She has followed up with her primary care provider and has sliding scale insulin to cover. ?Labs last visit showed angiotensin-converting enzyme was low at 6,Sed rate 17 and rheumatoid factor negative ?Has some dry cough and intermittent wheezing most days. ?She denies any hemoptysis, chest pain, orthopnea, edema.  No rash. ?Does get yearly eye exams. ? ? ? ? ?Allergies  ?Allergen Reactions  ? Etodolac Rash  ? Penicillin G Rash  ? Penicillins Rash  ?  Has patient had a PCN reaction causing immediate rash, facial/tongue/throat swelling, SOB or lightheadedness with hypotension: Yes ?Has patient had a PCN reaction causing severe rash involving mucus membranes or skin necrosis: No ?Has patient had a PCN reaction that required hospitalization: No ?Has patient had a  PCN reaction occurring within the last 10 years: No ?If all of the above answers are "NO", then may proceed with Cephalosporin use.   ? ? ?Immunization History  ?Administered Date(s) Administered  ? Influenza,inj,Quad PF,6+ Mos 05/15/2019  ? Influenza-Unspecified 06/04/2015, 06/10/2016, 06/08/2017, 05/25/2018, 06/01/2020, 05/20/2021  ? PFIZER(Purple Top)SARS-COV-2 Vaccination 12/02/2019, 12/23/2019  ? Pneumococcal Polysaccharide-23 05/16/2019  ? Tdap 06/04/2015  ? ? ?Past Medical History:  ?Diagnosis Date  ? Chronic kidney disease, stage III (moderate) ([place]) 10/06/2017  ? Diabetes mellitus without complication ([place])   ? GERD (gastroesophageal reflux disease)   ? Hypercholesteremia   ? Hypertension 09/10/2015  ? Sarcoidosis 10/12/2021  ? self-reported  ? ? ?Tobacco History: ?Social History  ? ?Tobacco Use  ?Smoking Status Former  ? Types: Cigarettes  ? Quit date: 04/17/2005  ? Years since quitting: 16.5  ?Smokeless Tobacco Never  ? ?Counseling given: Not Answered ? ? ?Outpatient Medications Prior to Visit  ?Medication Sig Dispense Refill  ? aspirin EC 81 MG tablet Take 1 tablet (81 mg total) by mouth daily.    ? cetirizine (ZYRTEC) 10 MG tablet TAKE ONE TABLET BY MOUTH EVERY DAY 90 tablet 1  ? cyanocobalamin (,VITAMIN B-12,) 1000 MCG/ML injection INJECT 1 ML INTO THE MUSCLE EVERY 30 DAYS 1 mL 12  ? Dulaglutide 3 MG/0.5ML SOPN INJECT 3MG  INTO THE SKIN ONCE A WEEK AS DIRECTED 16 mL 3  ? DULoxetine (CYMBALTA) 30 MG capsule Take 3 capsules (90 mg total) by mouth once daily. 90 capsule 2  ? ferrous sulfate (FEROSUL) 325 (65 FE) MG tablet TAKE ONE TABLET BY MOUTH EVERY DAY WITH BREAKFAST. 90 tablet 1  ? gabapentin (NEURONTIN) 400 MG capsule TAKE ONE CAPSULE BY MOUTH 2 TIMES A DAY AND TAKE 2 CAPSULES BY MOUTH AT BEDTIME 120 capsule 0  ? hydrOXYzine (ATARAX) 25 MG tablet Take 1 tablet (25 mg total) by mouth 2 (two) times daily as needed. 60 tablet 2  ? Insulin Glargine (BASAGLAR KWIKPEN) 100 UNIT/ML INJECT 17 UNITS UNDER THE SKIN ONCE DAILY AT BEDTIME. 15 mL 4  ? insulin lispro (HUMALOG KWIKPEN) 100 UNIT/ML KwikPen Inject 0.02 mLs (2 Units total) into the  skin 3 (three) times daily with meals. If blood sugar is 200 add 1 unit,  ?251-300 add 2 units ?301-350 add 3 units ?351-400 add 4 units ?Over 401 add 5 units, recheck in 2 hours and call clinic 5 mL 5  ? Insulin Pen Needle 32G X 6 MM MISC Use as directed. 100 each PRN  ? lisinopril (ZESTRIL) 5 MG tablet Take 1 tablet (5 mg total) by mouth once daily. 30 tablet 0  ? Melatonin 10 MG TABS Take 1 tablet by mouth at bedtime.    ? metFORMIN (GLUCOPHAGE) 500 MG tablet TAKE ONE TABLET BY MOUTH 2 TIMES A DAY 180 tablet 0  ? metoprolol tartrate (LOPRESSOR) 25 MG tablet Take 3 tablets (75 mg total) by mouth 2 (two) times daily. 180 tablet 1  ? montelukast (SINGULAIR) 10 MG tablet TAKE ONE TABLET BY MOUTH ONCE DAILY AT BEDTIME. 90 tablet 0  ? nitroGLYCERIN (NITROSTAT) 0.4 MG SL tablet DISSOLVE 1 TABLET UNDER TONGUE AS NEEDED FOR CHEST PAIN EVERY 5 MINUTES FOR MAX OF 3 DOSES IN 15 MINUTES. IF NO RELIEF AFTER FIRST DOSE CALL 911. 25 tablet 0  ? Omega-3 Fatty Acids (FISH OIL) 1000 MG CAPS Take 1,000 mg by mouth 2 (two) times daily.    ? omeprazole (PRILOSEC) 20 MG capsule TAKE ONE CAPSULE BY MOUTH 2  TIMES A DAY BEFORE MEALS 60 capsule 3  ? rosuvastatin (CRESTOR) 5 MG tablet TAKE ONE TABLET BY MOUTH ONCE DAILY 60 tablet 2  ? senna-docusate (SENOKOT-S) 8.6-50 MG tablet Take 1 tablet by mouth once daily. 30 tablet 3  ? traZODone (DESYREL) 100 MG tablet Take 1 tablet (100 mg total) by mouth once daily at bedtime. 30 tablet 2  ? predniSONE (DELTASONE) 10 MG tablet Take 1 tablet (10 mg total) by mouth once daily with breakfast. 30 tablet 1  ? metoprolol tartrate (LOPRESSOR) 100 MG tablet Take 1 tablet (100 mg total) by mouth once for 1 dose. Please take one time dose of 100mg  metoprolol tartrate 2 hours prior to cardiac CT for HR control IF HR >55bpm. 1 tablet 0  ? ?Facility-Administered Medications Prior to Visit  ?Medication Dose Route Frequency Provider Last Rate Last Admin  ? albuterol (PROVENTIL) (2.5 MG/3ML) 0.083% nebulizer  solution 2.5 mg  2.5 mg Nebulization Once Tyler Pita, MD      ? ? ? ?Review of Systems:  ? ?Constitutional:   No  weight loss, night sweats,  Fevers, chills,  ?+fatigue, or  lassitude. ? ?HEENT:   No h

## 2021-11-12 NOTE — Assessment & Plan Note (Signed)
No recurrences ?Continue follow-up with cardiology ?

## 2021-11-15 ENCOUNTER — Inpatient Hospital Stay: Admission: RE | Admit: 2021-11-15 | Payer: Medicaid Other | Source: Ambulatory Visit

## 2021-11-15 ENCOUNTER — Telehealth: Payer: Self-pay | Admitting: Adult Health

## 2021-11-15 ENCOUNTER — Other Ambulatory Visit: Payer: Self-pay

## 2021-11-15 MED ORDER — ALBUTEROL SULFATE HFA 108 (90 BASE) MCG/ACT IN AERS
2.0000 | INHALATION_SPRAY | Freq: Four times a day (QID) | RESPIRATORY_TRACT | 3 refills | Status: DC | PRN
  Filled 2021-11-15 – 2022-01-25 (×2): qty 6.7, 25d supply, fill #0
  Filled 2022-01-25 – 2022-03-15 (×2): qty 6.7, 25d supply, fill #1

## 2021-11-15 NOTE — Progress Notes (Signed)
Agree with the details of the visit as noted by Tammy Parrett, NP.  C. Laura Senon Nixon, MD [facility] 

## 2021-11-15 NOTE — Telephone Encounter (Signed)
Albuterol sent in to patient preferred pharmacy nothing further needed.  ?

## 2021-11-16 ENCOUNTER — Encounter: Payer: Self-pay | Admitting: Gerontology

## 2021-11-16 ENCOUNTER — Other Ambulatory Visit: Payer: Self-pay

## 2021-11-16 ENCOUNTER — Ambulatory Visit: Payer: Self-pay | Admitting: Gerontology

## 2021-11-16 VITALS — BP 109/72 | HR 81 | Temp 98.0°F | Resp 16 | Ht 62.0 in | Wt 173.3 lb

## 2021-11-16 DIAGNOSIS — R300 Dysuria: Secondary | ICD-10-CM

## 2021-11-16 DIAGNOSIS — E1140 Type 2 diabetes mellitus with diabetic neuropathy, unspecified: Secondary | ICD-10-CM

## 2021-11-16 DIAGNOSIS — R053 Chronic cough: Secondary | ICD-10-CM

## 2021-11-16 DIAGNOSIS — R350 Frequency of micturition: Secondary | ICD-10-CM

## 2021-11-16 DIAGNOSIS — N390 Urinary tract infection, site not specified: Secondary | ICD-10-CM | POA: Insufficient documentation

## 2021-11-16 HISTORY — DX: Urinary tract infection, site not specified: N39.0

## 2021-11-16 HISTORY — DX: Chronic cough: R05.3

## 2021-11-16 LAB — POCT URINALYSIS DIPSTICK
Bilirubin, UA: NEGATIVE
Glucose, UA: NEGATIVE
Ketones, UA: NEGATIVE
Nitrite, UA: POSITIVE
Protein, UA: NEGATIVE
Spec Grav, UA: 1.025 (ref 1.010–1.025)
Urobilinogen, UA: 0.2 E.U./dL
pH, UA: 5.5 (ref 5.0–8.0)

## 2021-11-16 MED ORDER — NITROFURANTOIN MONOHYD MACRO 100 MG PO CAPS
100.0000 mg | ORAL_CAPSULE | Freq: Two times a day (BID) | ORAL | 0 refills | Status: DC
  Filled 2021-11-16: qty 20, 10d supply, fill #0

## 2021-11-16 MED ORDER — NITROFURANTOIN MONOHYD MACRO 100 MG PO CAPS
100.0000 mg | ORAL_CAPSULE | Freq: Two times a day (BID) | ORAL | 0 refills | Status: DC
  Filled 2021-11-16: qty 10, 5d supply, fill #0

## 2021-11-16 MED ORDER — INSULIN LISPRO 100 UNIT/ML IJ SOLN
2.0000 [IU] | Freq: Three times a day (TID) | INTRAMUSCULAR | 11 refills | Status: DC
  Filled 2021-11-16 – 2022-02-24 (×2): qty 10, 167d supply, fill #0
  Filled 2022-02-24: qty 10, 28d supply, fill #0

## 2021-11-16 NOTE — Patient Instructions (Signed)

## 2021-11-16 NOTE — Progress Notes (Signed)
? ?Established Patient Office Visit ? ?Subjective:  ?Patient ID: Renee Bush, female    DOB: [birth year]  Age: 61 y.o. MRN: [id number] ? ?CC:  ?Chief Complaint  ?Patient presents with  ? Follow-up  ? Dysuria  ?  Patient c/o left sided flank pain radiating to her abdomen x several weeks. Patient states she is having urinary frequency.  ? ? ?HPI ?Renee Bush is a 61 year old female with history of T2DM, HTN, HLD , Sarcoidosis, presents for routine follow up and c/o left sided flank pain. She was seen by Cardiologist Dr End on 10/27/21 and was recommended to continue her current medication. She was also seen at the Pulmonology clinic by Mikey Bussing NP on 11/12/21 for Sarcoidosis. Per visit note,We will slowly taper prednisone over the next 6 to 8 weeks to off. Consider reevaluation with CT scan at that time to determine lymph node involvement. Yearly eye exam, and changing ACE inhibitor as patient has daily cough.  ?Currently, she c/o experiencing dry non productive cough which has being going on for a long time. She c/o left flank pain that has being going on for one month, dysuria, urinary frequency, urgency, but denies fever and chills. Her HgbA1c done on 10/06/21 was 7.2%, she continues to take Prednisone and checks her blood glucose bid. She states that her fasting reading was 140 mg/dl and she takes Insulin Lispro 2 units tid. Overall, she states that she's doing well, but for her left flank pain. ? ?Past Medical History:  ?Diagnosis Date  ? Chronic kidney disease, stage III (moderate) ([place]) 10/06/2017  ? Diabetes mellitus without complication ([place])   ? GERD (gastroesophageal reflux disease)   ? Hypercholesteremia   ? Hypertension 09/10/2015  ? Sarcoidosis 10/12/2021  ? self-reported  ? ? ?Past Surgical History:  ?Procedure Laterality Date  ? APPENDECTOMY    ? Renee Bush  ? COLONOSCOPY WITH PROPOFOL N/A 06/03/2019  ? Procedure: COLONOSCOPY WITH PROPOFOL;  Surgeon: Jonathon Bellows, MD;  Location:  [facility];  Service: Gastroenterology;  Laterality: N/A;  ? ? ?Family History  ?Problem Relation Age of Onset  ? COPD Mother   ? Alzheimer's disease Mother   ? Hypertension Father   ? Hyperlipidemia Father   ? Heart attack Father   ? Diabetes type II Father   ? Lupus Father   ? Diabetes type II Sister   ? Diabetes type II Sister   ? Diabetes type II Sister   ? Diabetes type II Sister   ? Gestational diabetes Daughter   ? ? ?Social History  ? ?Socioeconomic History  ? Marital status: Married  ?  Spouse name: Carloyn Manner  ? Number of children: 4  ? Years of education: Not on file  ? Highest education level: 11th grade  ?Occupational History  ? Occupation: na  ?Tobacco Use  ? Smoking status: Former  ?  Types: Cigarettes  ?  Quit date: 04/17/2005  ?  Years since quitting: 16.5  ? Smokeless tobacco: Never  ?Vaping Use  ? Vaping Use: Never used  ?Substance and Sexual Activity  ? Alcohol use: Not Currently  ?  Alcohol/week: 14.0 standard drinks  ?  Types: 14 Cans of beer per week  ?  Comment: quit ~2020  ? Drug use: No  ? Sexual activity: Not Currently  ?Other Topics Concern  ? Not on file  ?Social History Narrative  ? Lives in mobile home paid for  ? ?Social Determinants of Health  ? ?  Financial Resource Strain: Not on file  ?Food Insecurity: No Food Insecurity  ? Worried About Charity fundraiser in the Last Year: Never true  ? Ran Out of Food in the Last Year: Never true  ?Transportation Needs: No Transportation Needs  ? Lack of Transportation (Medical): No  ? Lack of Transportation (Non-Medical): No  ?Physical Activity: Not on file  ?Stress: Not on file  ?Social Connections: Not on file  ?Intimate Partner Violence: Not on file  ? ? ?Outpatient Medications Prior to Visit  ?Medication Sig Dispense Refill  ? albuterol (PROVENTIL HFA) 108 (90 Base) MCG/ACT inhaler Inhale 2 puffs into the lungs every 6 (six) hours as needed for wheezing or shortness of breath. 6.7 g 3  ? aspirin EC 81 MG tablet Take 1 tablet (81 mg total) by  mouth daily.    ? cetirizine (ZYRTEC) 10 MG tablet TAKE ONE TABLET BY MOUTH EVERY DAY 90 tablet 1  ? cyanocobalamin (,VITAMIN B-12,) 1000 MCG/ML injection INJECT 1 ML INTO THE MUSCLE EVERY 30 DAYS 1 mL 12  ? Dulaglutide 3 MG/0.5ML SOPN INJECT 3MG  INTO THE SKIN ONCE A WEEK AS DIRECTED 16 mL 3  ? DULoxetine (CYMBALTA) 30 MG capsule Take 3 capsules (90 mg total) by mouth once daily. 90 capsule 2  ? ferrous sulfate (FEROSUL) 325 (65 FE) MG tablet TAKE ONE TABLET BY MOUTH EVERY DAY WITH BREAKFAST. 90 tablet 1  ? gabapentin (NEURONTIN) 400 MG capsule TAKE ONE CAPSULE BY MOUTH 2 TIMES A DAY AND TAKE 2 CAPSULES BY MOUTH AT BEDTIME 120 capsule 0  ? hydrOXYzine (ATARAX) 25 MG tablet Take 1 tablet (25 mg total) by mouth 2 (two) times daily as needed. 60 tablet 2  ? Insulin Glargine (BASAGLAR KWIKPEN) 100 UNIT/ML INJECT 17 UNITS UNDER THE SKIN ONCE DAILY AT BEDTIME. 15 mL 4  ? Insulin Pen Needle 32G X 6 MM MISC Use as directed. 100 each PRN  ? Melatonin 10 MG TABS Take 1 tablet by mouth at bedtime.    ? metFORMIN (GLUCOPHAGE) 500 MG tablet TAKE ONE TABLET BY MOUTH 2 TIMES A DAY 180 tablet 0  ? metoprolol tartrate (LOPRESSOR) 25 MG tablet Take 3 tablets (75 mg total) by mouth 2 (two) times daily. 180 tablet 1  ? montelukast (SINGULAIR) 10 MG tablet TAKE ONE TABLET BY MOUTH ONCE DAILY AT BEDTIME. 90 tablet 0  ? nitroGLYCERIN (NITROSTAT) 0.4 MG SL tablet DISSOLVE 1 TABLET UNDER TONGUE AS NEEDED FOR CHEST PAIN EVERY 5 MINUTES FOR MAX OF 3 DOSES IN 15 MINUTES. IF NO RELIEF AFTER FIRST DOSE CALL 911. 25 tablet 0  ? Omega-3 Fatty Acids (FISH OIL) 1000 MG CAPS Take 1,000 mg by mouth 2 (two) times daily.    ? omeprazole (PRILOSEC) 20 MG capsule TAKE ONE CAPSULE BY MOUTH 2 TIMES A DAY BEFORE MEALS 60 capsule 3  ? predniSONE (DELTASONE) 10 MG tablet TAKE 1 TABLET (10MG ) BY MOUTH ONCE DAILY FOR 2 WEEKS. THEN TAKE (1/2) TABLET (5MG ) BY MOUTH ONCE DAILY FOR 4 WEEKS. THEN TAKE (1/2) TABLET (5MG ) BY MOUTH ONCE EVERY OTHER DAY FOR 2 WEEKS. 60  tablet 0  ? rosuvastatin (CRESTOR) 5 MG tablet TAKE ONE TABLET BY MOUTH ONCE DAILY 60 tablet 2  ? senna-docusate (SENOKOT-S) 8.6-50 MG tablet Take 1 tablet by mouth once daily. 30 tablet 3  ? traZODone (DESYREL) 100 MG tablet Take 1 tablet (100 mg total) by mouth once daily at bedtime. 30 tablet 2  ? insulin lispro (HUMALOG KWIKPEN) 100  UNIT/ML KwikPen Inject 0.02 mLs (2 Units total) into the skin 3 (three) times daily with meals. If blood sugar is 200 add 1 unit,  ?251-300 add 2 units ?301-350 add 3 units ?351-400 add 4 units ?Over 401 add 5 units, recheck in 2 hours and call clinic 5 mL 5  ? lisinopril (ZESTRIL) 5 MG tablet Take 1 tablet (5 mg total) by mouth once daily. 30 tablet 0  ? metoprolol tartrate (LOPRESSOR) 100 MG tablet Take 1 tablet (100 mg total) by mouth once for 1 dose. Please take one time dose of 100mg  metoprolol tartrate 2 hours prior to cardiac CT for HR control IF HR >55bpm. 1 tablet 0  ? ?Facility-Administered Medications Prior to Visit  ?Medication Dose Route Frequency Provider Last Rate Last Admin  ? albuterol (PROVENTIL) (2.5 MG/3ML) 0.083% nebulizer solution 2.5 mg  2.5 mg Nebulization Once Tyler Pita, MD      ? ? ?Allergies  ?Allergen Reactions  ? Etodolac Rash  ? Penicillin G Rash  ? Penicillins Rash  ?  Has patient had a PCN reaction causing immediate rash, facial/tongue/throat swelling, SOB or lightheadedness with hypotension: Yes ?Has patient had a PCN reaction causing severe rash involving mucus membranes or skin necrosis: No ?Has patient had a PCN reaction that required hospitalization: No ?Has patient had a PCN reaction occurring within the last 10 years: No ?If all of the above answers are "NO", then may proceed with Cephalosporin use.  ? ? ?ROS ?Review of Systems  ?Constitutional: Negative.   ?Eyes: Negative.   ?Respiratory: Negative.    ?Cardiovascular: Negative.   ?Endocrine: Negative.   ?Genitourinary:  Positive for dysuria, flank pain, frequency and urgency.   ?Neurological: Negative.   ? ?  ?Objective:  ?  ?Physical Exam ?HENT:  ?   Head: Normocephalic and atraumatic.  ?   Mouth/Throat:  ?   Mouth: Mucous membranes are moist.  ?Eyes:  ?   Extraocular Movements: Shea Stakes

## 2021-11-16 NOTE — Progress Notes (Signed)
Gave Vitamin B12 injection 1074mcg/ml IM Left deltoid. Patient tolerated without complaint. ?

## 2021-11-18 LAB — UA/M W/RFLX CULTURE, ROUTINE
Bilirubin, UA: NEGATIVE
Glucose, UA: NEGATIVE
Ketones, UA: NEGATIVE
Nitrite, UA: NEGATIVE
Protein,UA: NEGATIVE
Specific Gravity, UA: 1.018 (ref 1.005–1.030)
Urobilinogen, Ur: 0.2 mg/dL (ref 0.2–1.0)
pH, UA: 5.5 (ref 5.0–7.5)

## 2021-11-18 LAB — MICROSCOPIC EXAMINATION
Casts: NONE SEEN /lpf
WBC, UA: >30 /hpf — AB (ref 0–5)

## 2021-11-18 LAB — URINE CULTURE, REFLEX

## 2021-11-20 ENCOUNTER — Other Ambulatory Visit: Payer: Self-pay | Admitting: Gerontology

## 2021-11-20 DIAGNOSIS — K219 Gastro-esophageal reflux disease without esophagitis: Secondary | ICD-10-CM

## 2021-11-20 DIAGNOSIS — E1140 Type 2 diabetes mellitus with diabetic neuropathy, unspecified: Secondary | ICD-10-CM

## 2021-11-23 ENCOUNTER — Ambulatory Visit: Payer: Self-pay | Admitting: Licensed Clinical Social Worker

## 2021-11-23 ENCOUNTER — Ambulatory Visit: Payer: Medicaid Other | Admitting: Licensed Clinical Social Worker

## 2021-11-23 ENCOUNTER — Other Ambulatory Visit: Payer: Self-pay

## 2021-11-23 DIAGNOSIS — F411 Generalized anxiety disorder: Secondary | ICD-10-CM

## 2021-11-23 DIAGNOSIS — F339 Major depressive disorder, recurrent, unspecified: Secondary | ICD-10-CM

## 2021-11-23 MED FILL — Duloxetine HCl Enteric Coated Pellets Cap 30 MG (Base Eq): ORAL | 30 days supply | Qty: 90 | Fill #1 | Status: CN

## 2021-11-23 NOTE — BH Specialist Note (Signed)
Integrated Behavioral Health via Telemedicine Visit ? ?11/23/2021 ?Renee Bush ?[id number] ? ?Number of [facility] Clinician visits: No data recorded ?Session Start time: No data recorded  ?Session End time: No data recorded ?Total time in minutes: No data recorded ? ?Referring Provider: Carlyon Shadow, NP ?Patient/Family location: The patient's home ?[facility] Provider location: [facility] ?All persons participating in visit: Scottie Stanish. Ebony Hail and [employer], LCSW-A ?Types of Service: Telephone visit ? ?I connected with Renee Bush  via  Telephone or Video Enabled Telemedicine Application  (Video is Caregility application) and verified that I am speaking with the correct person using two identifiers. Discussed confidentiality: Yes  ? ?I discussed the limitations of telemedicine and the availability of in person appointments.  Discussed there is a possibility of technology failure and discussed alternative modes of communication if that failure occurs. ? ?Patient and/or legal guardian expressed understanding and consented to Telemedicine visit: Yes  ? ?Presenting Concerns: ?Patient and/or family reports the following symptoms/concerns: The patient reports that she has been doing okay since her last follow-up appointment. She noted that she was unable to complete testing on her heart due to tachycardia She noted that her cardiologist wants her heart rate to be around 60 so they can get a clear image. She shared that she is looking forward to finishing this test so she can take a break from so many appointments. She shared she currently has an UTI and finished her antibiotics, but still suffered from increased pain and urinary frequency in addition she felt like she was not emptying her bladder fully.The patient requested the clinician message her provider because she has called and left a message earlier and was concerned her provider may not get it. Renee Bush noted  that she was babysitting her three year old granddaughter and the child fell off the trampoline backwards and broke her arm last week. The patient stated that she cried and has blamed herself for the accident since that time. The patient explained that she continues to struggle with frequent crying throughout her day. She noted she also has experienced irritability and at times isolates herself from others. Renee Bush noted that she has been more hungry than usual and has been overeating and eating late at night. She shared that she feels this is because she is taking prednisone.The patient denied any suicidal or homicidal thoughts.  ?Duration of problem: Years; Severity of problem: moderate ? ?Patient and/or Family's Strengths/Protective Factors: ?Concrete supports in place (healthy food, safe environments, etc.) and Sense of purpose ? ?Goals Addressed: ?Patient will: ? Reduce symptoms of: agitation, anxiety, depression, insomnia, and stress  ? Increase knowledge and/or ability of: coping skills, healthy habits, self-management skills, and stress reduction  ? Demonstrate ability to: Increase healthy adjustment to current life circumstances ? ?Progress towards Goals: ?Ongoing ? ?Interventions: ?Interventions utilized:  CBT Cognitive Behavioral Therapywas utilized by the clinician during today's follow up session. Clinician met with patient to identify needs related to stressors and functioning, and assess and monitor for signs and symptoms of anxiety and depression, and assess safety. The clinician processed with the patient how they have been doing since the last follow-up session. Clinician measured the patient's depression and anxiety on a numerical scale. Clinician encouraged the patient to continue to work on calming techniques (e.g.. paced breathing, deep muscle relaxation, and calming imagery) as a strategy for responding appropriately to anxiety and the urge to avoid situations or self-isolate when they  occur and move  towards increasing the patients self regulation. Clinician messaged the patient's provider regarding her medical concerns. Clinician explained that the psychiatric consults recommendations for treatment included increasing her Cymbalta to 120 MG daily and her primary care provider would call that in for her. Additionally, Clinician utilized guidance and supervision, provided by psychiatric consultant, to inform patient of the benefits and risks of psychotropic medication use including a verbal summary of [place] Box warnings. Patient was given the opportunity to ask questions and address concerns regarding the psychiatric consultants recommendations for treatment The session ended with scheduling.  ?Standardized Assessments completed: GAD-7 and PHQ 9 ?GAD-7 = 17 ?PHQ-9 = 18 ? ?Assessment: ?Patient currently experiencing See above.  ? ?Patient may benefit from see above. ? ?Plan: ?Follow up with behavioral health clinician on : 12/02/2021 at 11:00 AM  ?Behavioral recommendations:  ?Referral(s): [place] (In Clinic) ? ?I discussed the assessment and treatment plan with the patient and/or parent/guardian. They were provided an opportunity to ask questions and all were answered. They agreed with the plan and demonstrated an understanding of the instructions. ?  ?They were advised to call back or seek an in-person evaluation if the symptoms worsen or if the condition fails to improve as anticipated. ? ?[name], LCSWA ?

## 2021-11-24 ENCOUNTER — Other Ambulatory Visit: Payer: Medicaid Other

## 2021-11-24 ENCOUNTER — Other Ambulatory Visit: Payer: Self-pay

## 2021-11-24 ENCOUNTER — Other Ambulatory Visit: Payer: Self-pay | Admitting: Emergency Medicine

## 2021-11-24 ENCOUNTER — Encounter (HOSPITAL_COMMUNITY): Payer: Self-pay

## 2021-11-24 ENCOUNTER — Telehealth (HOSPITAL_COMMUNITY): Payer: Self-pay | Admitting: Emergency Medicine

## 2021-11-24 ENCOUNTER — Other Ambulatory Visit: Payer: Self-pay | Admitting: Gerontology

## 2021-11-24 DIAGNOSIS — N390 Urinary tract infection, site not specified: Secondary | ICD-10-CM

## 2021-11-24 DIAGNOSIS — F3341 Major depressive disorder, recurrent, in partial remission: Secondary | ICD-10-CM

## 2021-11-24 MED ORDER — OMEPRAZOLE 20 MG PO CPDR
DELAYED_RELEASE_CAPSULE | ORAL | 3 refills | Status: DC
  Filled 2021-11-24: qty 60, 30d supply, fill #0
  Filled 2021-12-21: qty 180, 90d supply, fill #1

## 2021-11-24 MED ORDER — GABAPENTIN 400 MG PO CAPS
ORAL_CAPSULE | ORAL | 0 refills | Status: DC
  Filled 2021-11-24: qty 120, 30d supply, fill #0

## 2021-11-24 MED ORDER — DULOXETINE HCL 30 MG PO CPEP
60.0000 mg | ORAL_CAPSULE | Freq: Two times a day (BID) | ORAL | 0 refills | Status: DC
  Filled 2021-11-24: qty 60, 30d supply, fill #0
  Filled 2021-11-25: qty 120, 30d supply, fill #0

## 2021-11-24 NOTE — Telephone Encounter (Signed)
Reaching out to patient to offer assistance regarding upcoming cardiac imaging study; pt verbalizes understanding of appt date/time, parking situation and where to check in, pre-test NPO status and medications ordered, and verified current allergies; name and call back number provided for further questions should they arise ?Rockwell Alexandria RN Navigator Cardiac Imaging ?[facility] ?[phone] office ?[phone] cell ? ?Denies iv issues ?100mg  metoprolol + 15mg  ivabradine  ?Arrival 1230 ?Sending mychart message ? ?

## 2021-11-25 ENCOUNTER — Other Ambulatory Visit: Payer: Self-pay | Admitting: Gerontology

## 2021-11-25 ENCOUNTER — Other Ambulatory Visit: Payer: Self-pay

## 2021-11-25 ENCOUNTER — Ambulatory Visit (HOSPITAL_COMMUNITY)
Admission: RE | Admit: 2021-11-25 | Discharge: 2021-11-25 | Disposition: A | Discharge status: Home / Self Care | Payer: Medicaid Other | Source: Ambulatory Visit | Attending: Internal Medicine | Admitting: Internal Medicine

## 2021-11-25 DIAGNOSIS — R072 Precordial pain: Secondary | ICD-10-CM | POA: Diagnosis not present

## 2021-11-25 DIAGNOSIS — N390 Urinary tract infection, site not specified: Secondary | ICD-10-CM

## 2021-11-25 MED ORDER — SULFAMETHOXAZOLE-TRIMETHOPRIM 800-160 MG PO TABS
1.0000 | ORAL_TABLET | Freq: Two times a day (BID) | ORAL | 0 refills | Status: DC
  Filled 2021-11-25: qty 14, 7d supply, fill #0

## 2021-11-25 MED ORDER — IOHEXOL 350 MG/ML SOLN
95.0000 mL | Freq: Once | INTRAVENOUS | Status: AC | PRN
  Administered 2021-11-25: 95 mL via INTRAVENOUS

## 2021-11-25 MED ORDER — NITROGLYCERIN 0.4 MG SL SUBL
SUBLINGUAL_TABLET | SUBLINGUAL | Status: AC
  Filled 2021-11-25: qty 2

## 2021-11-25 MED ORDER — NITROGLYCERIN 0.4 MG SL SUBL
0.8000 mg | SUBLINGUAL_TABLET | Freq: Once | SUBLINGUAL | Status: AC
  Administered 2021-11-25: 0.8 mg via SUBLINGUAL

## 2021-11-25 NOTE — Progress Notes (Signed)
Pt came in for CT of her heart with an elevated heart rate (78-81) along with a systolic BP of less than 110. This RN called the CT department who requested I contact Dr. Anne Fu for approval. Dr. Anne Fu approved the scan at the elevated heart rate with the nitroglycerin with no other medications to treat the heart rate. Dr. Anne Fu stated if the blood pressure drops after the scan, pt can receive a bolus of NS if needed. Scan completed and BP was 104/62 and pt felt good. Pt taken back to her husband and pt and her husband walked out together.  ?

## 2021-11-26 ENCOUNTER — Other Ambulatory Visit: Payer: Self-pay

## 2021-11-29 ENCOUNTER — Telehealth: Payer: Self-pay | Admitting: Pharmacist

## 2021-11-29 MED FILL — Hydroxyzine HCl Tab 25 MG: ORAL | 30 days supply | Qty: 60 | Fill #1 | Status: AC

## 2021-11-29 MED FILL — Metoprolol Tartrate Tab 25 MG: ORAL | 30 days supply | Qty: 180 | Fill #1 | Status: AC

## 2021-11-29 MED FILL — Trazodone HCl Tab 100 MG: ORAL | 30 days supply | Qty: 30 | Fill #1 | Status: AC

## 2021-11-29 NOTE — Telephone Encounter (Signed)
11/29/2021 11:18:35 AM - Cymbalta dose increase forms to provider ?-- Tarry Kos - Monday, November 29, 2021 11:16 AM -- Received printout from pharmacy for Cymbalta 30mg  dose increase. Forms in [facility] folder on my desk for provider to sign. ?

## 2021-11-30 ENCOUNTER — Other Ambulatory Visit: Payer: Self-pay

## 2021-11-30 LAB — UA/M W/RFLX CULTURE, ROUTINE
Bilirubin, UA: NEGATIVE
Glucose, UA: NEGATIVE
Ketones, UA: NEGATIVE
Nitrite, UA: POSITIVE — AB
Protein,UA: NEGATIVE
RBC, UA: NEGATIVE
Specific Gravity, UA: 1.018 (ref 1.005–1.030)
Urobilinogen, Ur: 0.2 mg/dL (ref 0.2–1.0)
pH, UA: 5.5 (ref 5.0–7.5)

## 2021-11-30 LAB — MICROSCOPIC EXAMINATION
Casts: NONE SEEN /lpf
WBC, UA: >30 /hpf — AB (ref 0–5)

## 2021-11-30 LAB — URINE CULTURE, REFLEX

## 2021-12-01 ENCOUNTER — Telehealth: Payer: Self-pay | Admitting: Pharmacist

## 2021-12-01 NOTE — Telephone Encounter (Signed)
12/01/2021 8:26:37 AM - Cymbalta dose increase fax to [facility] ?-- Tarry Kos - Wednesday, December 01, 2021 8:25 AM --Cymbalta dose increase faxed to [facility]. ?

## 2021-12-02 ENCOUNTER — Ambulatory Visit: Payer: Medicaid Other | Admitting: Licensed Clinical Social Worker

## 2021-12-02 ENCOUNTER — Ambulatory Visit: Payer: Self-pay | Admitting: Licensed Clinical Social Worker

## 2021-12-02 DIAGNOSIS — F411 Generalized anxiety disorder: Secondary | ICD-10-CM

## 2021-12-02 DIAGNOSIS — F3341 Major depressive disorder, recurrent, in partial remission: Secondary | ICD-10-CM

## 2021-12-02 NOTE — BH Specialist Note (Signed)
Integrated Behavioral Health via Telemedicine Visit ? ?12/02/2021 ?Renee Bush ?[id number] ? ? ?Referring Provider: Carlyon Shadow, NP ?Patient/Family location: The patient's home ?[facility] Provider location: [facility] ?All persons participating in visit: Renee Bush. Ebony Hail and [employer], LCSW-A ?Types of Service: Telephone visit ? ?I connected with Renee Bush via  Telephone or Video Enabled Telemedicine Application  (Video is Caregility application) and verified that I am speaking with the correct person using two identifiers. Discussed confidentiality: Yes  ? ?I discussed the limitations of telemedicine and the availability of in person appointments.  Discussed there is a possibility of technology failure and discussed alternative modes of communication if that failure occurs. ? ?Patient and/or legal guardian expressed understanding and consented to Telemedicine visit: Yes  ? ?Presenting Concerns: ?Patient and/or family reports the following symptoms/concerns: The patient reports that she has been doing okay since her last follow-up appointment. She shared that she continues to struggle with a UTI. She noted that she is waking up throughout the night to go to the bathroom. She shared that she feels she has turned the corner and is hopeful this latest round of medications will work. Renee Bush discussed other health related stressors impacting her life. She noted that she was watching her granddaughter who recently required surgery to repair a broken arm. She explained that she still feels bad that she was hurt but knows she could not have stopped her from falling. The patient discussed her late mother in law and noted she feels like she is still grieving her. She noted that she still becomes tearful and when she sees reminders of her. The patient noted that she is looking forward to spending easter with her family and friends. She denied any suicidal or homicidal  thoughts.  ?Duration of problem: Years; Severity of problem: moderate ? ?Patient and/or Family's Strengths/Protective Factors: ?Social connections, Concrete supports in place (healthy food, safe environments, etc.), and Sense of purpose ? ?Goals Addressed: ?Patient will: ? Reduce symptoms of: agitation, anxiety, depression, and stress  ? Increase knowledge and/or ability of: coping skills, healthy habits, self-management skills, and stress reduction  ? Demonstrate ability to: Increase healthy adjustment to current life circumstances ? ?Progress towards Goals: ?Ongoing ? ?Interventions: ?Interventions utilized:  CBT Cognitive Behavioral Therapywas utilized by the clinician during today's follow up session. Clinician met with patient to identify needs related to stressors and functioning, and assess and monitor for signs and symptoms of anxiety and depression, and assess safety. The clinician processed with the patient how they have been doing since the last follow-up session. Clinician measured the patient's anxiety and depression on a numerical scale. Clinician supported the patient in expressing her ongoing grief over the loss of her mother in law. Clinician encouraged the client to continue to use her coping skills to help her deal with her current life circumstances and to incorporate intentional self care into her daily routine. Clinician checked in with the patient to assess her satisfaction and compliance with her psychotropic medication prescribed by her primary care provider, and determined that the patient reports that she takes her medications as prescribed and the patient denied experiencing any adverse reactions or side effects. The session ended with scheduling.  ?Standardized Assessments completed: GAD-7 and PHQ 9 ?GAD-7= 17 ?PHQ-9= 14 ? ?Assessment: ?Patient currently experiencing see above.  ? ?Patient may benefit from see above. ? ?Plan: ?Follow up with behavioral health clinician on : 12/16/2021 at  2:00 PM  ?Behavioral recommendations:  ?  Referral(s): [place] (In Clinic) ? ?I discussed the assessment and treatment plan with the patient and/or parent/guardian. They were provided an opportunity to ask questions and all were answered. They agreed with the plan and demonstrated an understanding of the instructions. ?  ?They were advised to call back or seek an in-person evaluation if the symptoms worsen or if the condition fails to improve as anticipated. ? ?[name], LCSWA ?

## 2021-12-06 ENCOUNTER — Ambulatory Visit: Payer: Medicaid Other | Admitting: Pharmacy Technician

## 2021-12-06 DIAGNOSIS — Z79899 Other long term (current) drug therapy: Secondary | ICD-10-CM

## 2021-12-06 NOTE — Progress Notes (Signed)
Received updated proof of income.  Patient eligible to receive medication assistance at [facility] until time for re-certification in 2024, and as long as eligibility requirements continue to be met. ? ?Keleigh Kazee J. Miquel Lamson ?Care Manager ?[facility]  ?

## 2021-12-07 ENCOUNTER — Other Ambulatory Visit: Payer: Self-pay | Admitting: Gerontology

## 2021-12-07 ENCOUNTER — Other Ambulatory Visit: Payer: Self-pay

## 2021-12-07 DIAGNOSIS — F3341 Major depressive disorder, recurrent, in partial remission: Secondary | ICD-10-CM

## 2021-12-08 ENCOUNTER — Other Ambulatory Visit: Payer: Self-pay

## 2021-12-08 MED FILL — Duloxetine HCl Enteric Coated Pellets Cap 30 MG (Base Eq): ORAL | Qty: 120 | Fill #0 | Status: CN

## 2021-12-10 ENCOUNTER — Ambulatory Visit (INDEPENDENT_AMBULATORY_CARE_PROVIDER_SITE_OTHER): Payer: Self-pay | Admitting: Internal Medicine

## 2021-12-10 ENCOUNTER — Encounter: Payer: Self-pay | Admitting: Internal Medicine

## 2021-12-10 VITALS — BP 104/74 | HR 78 | Ht 62.0 in | Wt 179.0 lb

## 2021-12-10 DIAGNOSIS — I2510 Atherosclerotic heart disease of native coronary artery without angina pectoris: Secondary | ICD-10-CM

## 2021-12-10 DIAGNOSIS — E1169 Type 2 diabetes mellitus with other specified complication: Secondary | ICD-10-CM

## 2021-12-10 DIAGNOSIS — D869 Sarcoidosis, unspecified: Secondary | ICD-10-CM

## 2021-12-10 DIAGNOSIS — I471 Supraventricular tachycardia: Secondary | ICD-10-CM

## 2021-12-10 DIAGNOSIS — E785 Hyperlipidemia, unspecified: Secondary | ICD-10-CM

## 2021-12-10 NOTE — Patient Instructions (Signed)
Medication Instructions:   Your physician recommends that you continue on your current medications as directed. Please refer to the Current Medication list given to you today.  *If you need a refill on your cardiac medications before your next appointment, please call your pharmacy*   Lab Work:  None ordered  Testing/Procedures:  None ordered   Follow-Up: At [facility], you and your health needs are our priority.  As part of our continuing mission to provide you with exceptional heart care, we have created designated Provider Care Teams.  These Care Teams include your primary Cardiologist (physician) and Advanced Practice Providers (APPs -  Physician Assistants and Nurse Practitioners) who all work together to provide you with the care you need, when you need it.  We recommend signing up for the patient portal called "MyChart".  Sign up information is provided on this After Visit Summary.  MyChart is used to connect with patients for Virtual Visits (Telemedicine).  Patients are able to view lab/test results, encounter notes, upcoming appointments, etc.  Non-urgent messages can be sent to your provider as well.   To learn more about what you can do with MyChart, go to [email or web address].com.    Your next appointment:   6 month(s)  The format for your next appointment:   In Person  Provider:   You may see Dr. Christopher End or one of the following Advanced Practice Providers on your designated Care Team:   Christopher Berge, NP Ryan Dunn, PA-C Cadence Furth, PA-C{   Important Information About Sugar       

## 2021-12-10 NOTE — Progress Notes (Signed)
? ?Follow-up Outpatient Visit ?Date: 12/10/2021 ? ?Primary Care Provider: ?Langston Reusing, NP ?[address] ?[place] [address] ? ?Chief Complaint: Follow-up PSVT ? ?HPI:  [name] is a 61 y.o. female with history of PSVT, hypertension, hyperlipidemia, type 2 diabetes mellitus, GERD, and recently diagnosed sarcoidosis, who presents for follow-up of PSVT.  I met her last month following ED visit for palpitations with concern for SVT based on EMS EKG.  Work-up was notable for lymphadenopathy and suspicion for sarcoidosis.  She also experienced chest pain during the episode with EKG demonstrating poor R wave progression.  We agreed to obtain a coronary CTA, which showed mild coronary artery plaquing and a calcium score of 276. ? ?Today, [name] reports that she has been feeling fairly well.  Her only complaint is of intermittent cramps in her calves at night.  She does not have any exertional pain in her legs.  She also denies chest pain, palpitations, lightheadedness, and edema.  Her breathing has been "okay."  She is in the process of weaning off prednisone that was started for treatment of her sarcoidosis.  She is scheduled for follow-up with Dr. Patsey Berthold next month. ? ?-------------------------------------------------------------------------------------------------- ? ?Cardiovascular History & Procedures: ?Cardiovascular Problems: ?Possible SVT ?  ?Risk Factors: ?Hypertension, hyperlipidemia, type 2 diabetes mellitus, coronary artery calcification, and prior tobacco use ?  ?Cath/PCI: ?None ?  ?CV Surgery: ?None ?  ?EP Procedures and Devices: ?None ?  ?Non-Invasive Evaluation(s): ?Coronary CTA (11/25/2021): Mild coronary artery plaquing with less than 25% stenoses in the LMCA, LAD, and RCA.  Coronary calcium score 276 (95th percentile for age and sex matched controls).  Stable mildly prominent subcarinal lymph node. ?TTE (10/29/2021): Normal LV size and wall thickness.  LVEF 60-65% with grade 1  diastolic dysfunction.  GLS -19%.  Normal RV size and function with upper normal PA pressure.  Normal biatrial size.  No pericardial effusion.  Mild-moderate tricuspid regurgitation. ? ?Recent CV Pertinent Labs: ?Lab Results  ?Component Value Date  ? CHOL 163 10/06/2021  ? CHOL 176 03/03/2014  ? HDL 40 10/06/2021  ? HDL 46 03/03/2014  ? [place] 96 10/06/2021  ? [place] 100 03/03/2014  ? TRIG 152 (H) 10/06/2021  ? TRIG 151 03/03/2014  ? CHOLHDL 4.1 10/06/2021  ? INR 0.94 06/04/2015  ? INR 1.0 03/02/2014  ? BNP 20.7 09/22/2021  ? K 4.3 11/08/2021  ? K 3.7 03/02/2014  ? BUN 23 11/08/2021  ? BUN 16 03/02/2014  ? CREATININE 1.11 (H) 11/08/2021  ? CREATININE 0.98 03/02/2014  ? ? ?Past medical and surgical history were reviewed and updated in EPIC. ? ?Current Meds  ?Medication Sig  ? albuterol (PROVENTIL HFA) 108 (90 Base) MCG/ACT inhaler Inhale 2 puffs into the lungs every 6 (six) hours as needed for wheezing or shortness of breath.  ? aspirin EC 81 MG tablet Take 1 tablet (81 mg total) by mouth daily.  ? cetirizine (ZYRTEC) 10 MG tablet TAKE ONE TABLET BY MOUTH EVERY DAY  ? Dulaglutide 3 MG/0.5ML SOPN INJECT $RemoveBef'3MG'DjEqsyNRAB$  INTO THE SKIN ONCE A WEEK AS DIRECTED  ? DULoxetine (CYMBALTA) 30 MG capsule Take 2 capsules (60 mg total) by mouth 2 (two) times daily.  ? ferrous sulfate (FEROSUL) 325 (65 FE) MG tablet TAKE ONE TABLET BY MOUTH EVERY DAY WITH BREAKFAST.  ? gabapentin (NEURONTIN) 400 MG capsule TAKE ONE CAPSULE BY MOUTH 2 TIMES A DAY AND TAKE 2 CAPSULES BY MOUTH AT BEDTIME  ? hydrOXYzine (ATARAX) 25 MG tablet Take  1 tablet (25 mg total) by mouth 2 (two) times daily as needed.  ? Insulin Glargine (BASAGLAR KWIKPEN) 100 UNIT/ML INJECT 17 UNITS UNDER THE SKIN ONCE DAILY AT BEDTIME.  ? insulin lispro (HUMALOG) 100 UNIT/ML injection Inject 0.02 mLs (2 Units total) into the skin 3 (three) times daily before meals. Sliding scale ?121-150 = 2 units + 1 unit = 3 units,  ?151-200 = 2 units + 2 units = 4 units ?201- 250 = 2 units + 3 units  = 5 units ?251 - 300 = 2 units + 4 units = 6 units ?301- 350 = 2 units + 5 units = 7 units ?351 - 400 = 2 units + 6 units = 8 units ?> 400 call clinic.  ? Insulin Pen Needle 32G X 6 MM MISC Use as directed.  ? Melatonin 10 MG TABS Take 1 tablet by mouth at bedtime.  ? metFORMIN (GLUCOPHAGE) 500 MG tablet TAKE ONE TABLET BY MOUTH 2 TIMES A DAY  ? metoprolol tartrate (LOPRESSOR) 25 MG tablet Take 3 tablets (75 mg total) by mouth 2 (two) times daily.  ? montelukast (SINGULAIR) 10 MG tablet TAKE ONE TABLET BY MOUTH ONCE DAILY AT BEDTIME.  ? nitroGLYCERIN (NITROSTAT) 0.4 MG SL tablet DISSOLVE 1 TABLET UNDER TONGUE AS NEEDED FOR CHEST PAIN EVERY 5 MINUTES FOR MAX OF 3 DOSES IN 15 MINUTES. IF NO RELIEF AFTER FIRST DOSE CALL 911.  ? Omega-3 Fatty Acids (FISH OIL) 1000 MG CAPS Take 1,000 mg by mouth 2 (two) times daily.  ? omeprazole (PRILOSEC) 20 MG capsule TAKE ONE CAPSULE BY MOUTH 2 TIMES A DAY BEFORE MEALS  ? predniSONE (DELTASONE) 10 MG tablet TAKE 1 TABLET ($RemoveB'10MG'CnJGgpWN$ ) BY MOUTH ONCE DAILY FOR 2 WEEKS. THEN TAKE (1/2) TABLET ($RemoveBefor'5MG'EBlKINkWEsKn$ ) BY MOUTH ONCE DAILY FOR 4 WEEKS. THEN TAKE (1/2) TABLET ($RemoveBefor'5MG'cJKjOSXeOPJG$ ) BY MOUTH ONCE EVERY OTHER DAY FOR 2 WEEKS.  ? rosuvastatin (CRESTOR) 5 MG tablet TAKE ONE TABLET BY MOUTH ONCE DAILY  ? senna-docusate (SENOKOT-S) 8.6-50 MG tablet Take 1 tablet by mouth once daily.  ? traZODone (DESYREL) 100 MG tablet Take 1 tablet (100 mg total) by mouth once daily at bedtime.  ? ? ?Allergies: Etodolac, Penicillin g, and Penicillins ? ?Social History  ? ?Tobacco Use  ? Smoking status: Former  ?  Types: Cigarettes  ?  Quit date: 04/17/2005  ?  Years since quitting: 16.6  ? Smokeless tobacco: Never  ?Vaping Use  ? Vaping Use: Never used  ?Substance Use Topics  ? Alcohol use: Not Currently  ?  Alcohol/week: 14.0 standard drinks  ?  Types: 14 Cans of beer per week  ?  Comment: quit ~2020  ? Drug use: No  ? ? ?Family History  ?Problem Relation Age of Onset  ? COPD Mother   ? Alzheimer's disease Mother   ? Hypertension Father    ? Hyperlipidemia Father   ? Heart attack Father   ? Diabetes type II Father   ? Lupus Father   ? Diabetes type II Sister   ? Diabetes type II Sister   ? Diabetes type II Sister   ? Diabetes type II Sister   ? Gestational diabetes Daughter   ? ? ?Review of Systems: ?A 12-system review of systems was performed and was negative except as noted in the HPI. ? ?-------------------------------------------------------------------------------------------------- ? ?Physical Exam: ?BP 104/74 (BP Location: Left Arm, Patient Position: Sitting, Cuff Size: Normal)   Pulse 78   Ht $R'5\' 2"'yk$  (1.575 m)   Wt  179 lb (81.2 kg)   SpO2 97%   BMI 32.74 kg/m?  ? ?General:  NAD. ?Neck: No JVD or HJR. ?Lungs: Clear to auscultation bilaterally without wheezes or crackles. ?Heart: Regular rate and rhythm without murmurs, rubs, or gallops. ?Abdomen: Soft, nontender, nondistended. ?Extremities: No lower extremity edema.  2+ posterior tibial and dorsalis pedis pulses bilaterally. ? ?EKG: Normal sinus rhythm with poor R wave progression most likely due to lead placement.  Otherwise, no significant abnormality. ? ?Lab Results  ?Component Value Date  ? WBC 8.6 09/22/2021  ? HGB 14.0 09/22/2021  ? HCT 40.9 09/22/2021  ? MCV 88.9 09/22/2021  ? PLT 181 09/22/2021  ? ? ?Lab Results  ?Component Value Date  ? NA 141 11/08/2021  ? K 4.3 11/08/2021  ? CL 105 11/08/2021  ? CO2 23 11/08/2021  ? BUN 23 11/08/2021  ? CREATININE 1.11 (H) 11/08/2021  ? GLUCOSE 142 (H) 11/08/2021  ? ALT 22 10/06/2021  ? ? ?Lab Results  ?Component Value Date  ? CHOL 163 10/06/2021  ? HDL 40 10/06/2021  ? [place] 96 10/06/2021  ? TRIG 152 (H) 10/06/2021  ? CHOLHDL 4.1 10/06/2021  ? ? ?-------------------------------------------------------------------------------------------------- ? ?ASSESSMENT AND PLAN: ?PSVT and sarcoidosis: ?No further palpitations reported.  Continue metoprolol tartrate 75 mg twice daily, as borderline low blood pressure again noted today precludes escalation.   If she were to have recurrent arrhythmias, we would need to consider cardiac MRI to exclude myocardial infiltration in the setting of sarcoidosis as well as referral to EP. ? ?Coronary artery disease:

## 2021-12-12 ENCOUNTER — Other Ambulatory Visit: Payer: Self-pay | Admitting: Gerontology

## 2021-12-12 DIAGNOSIS — E1140 Type 2 diabetes mellitus with diabetic neuropathy, unspecified: Secondary | ICD-10-CM

## 2021-12-13 ENCOUNTER — Other Ambulatory Visit: Payer: Self-pay

## 2021-12-14 ENCOUNTER — Other Ambulatory Visit: Payer: Self-pay

## 2021-12-14 MED ORDER — METFORMIN HCL 500 MG PO TABS
ORAL_TABLET | Freq: Two times a day (BID) | ORAL | 0 refills | Status: DC
  Filled 2021-12-14: qty 180, 90d supply, fill #0

## 2021-12-14 MED ORDER — FERROUS SULFATE 325 (65 FE) MG PO TABS
ORAL_TABLET | Freq: Every day | ORAL | 1 refills | Status: DC
  Filled 2021-12-14: qty 90, 90d supply, fill #0

## 2021-12-16 ENCOUNTER — Ambulatory Visit: Payer: Self-pay | Admitting: Licensed Clinical Social Worker

## 2021-12-16 ENCOUNTER — Other Ambulatory Visit: Payer: Self-pay

## 2021-12-16 ENCOUNTER — Ambulatory Visit: Payer: Medicaid Other | Admitting: Licensed Clinical Social Worker

## 2021-12-16 DIAGNOSIS — F411 Generalized anxiety disorder: Secondary | ICD-10-CM

## 2021-12-16 DIAGNOSIS — F3341 Major depressive disorder, recurrent, in partial remission: Secondary | ICD-10-CM

## 2021-12-16 MED FILL — Duloxetine HCl Enteric Coated Pellets Cap 30 MG (Base Eq): ORAL | 30 days supply | Qty: 120 | Fill #0 | Status: AC

## 2021-12-16 NOTE — BH Specialist Note (Signed)
Integrated Behavioral Health via Telemedicine Visit ? ?12/16/2021 ?Renee Bush ?[id number] ? ? ?Referring Provider: Carlyon Shadow, NP  ?Patient/Family location: The patient's home address ?[facility] Provider location: [facility] ?All persons participating in visit: Renee Bush. Renee Bush and [employer], LCSW-A ?Types of Service: Individual psychotherapy ? ?I connected with Renee Bush via  Telephone or Video Enabled Telemedicine Application  (Video is Caregility application) and verified that I am speaking with the correct person using two identifiers. Discussed confidentiality: Yes  ? ?I discussed the limitations of telemedicine and the availability of in person appointments.  Discussed there is a possibility of technology failure and discussed alternative modes of communication if that failure occurs. ? ?Patient and/or legal guardian expressed understanding and consented to Telemedicine visit: Yes  ? ?Presenting Concerns: ?Patient and/or family reports the following symptoms/concerns: The patient reports that she has been doing well since her last follow-up appointment. She shared that she has recovered from a recent UTI and can sleep better. She explained that she wakes up less often to use the bathroom but has little energy during the day. She shared that she does not nap in the daytime but feels tired daily. The patient explained that she feels irritable over half of the day a week. Renee Bush discussed other health and financial stressors impacting her life currently. She said she would pick up medication refills from [facility] this afternoon. She stated that she follows the directions from her doctor and is taking Trazodone 100 MG at bedtime, Cymbalta 30 MG twice a day, and hydroxyzine 25 MG twice a day as needed. She explained that she feels her psychotropic medications are working well for her. The patient shared that she spent [place] with her husband this  year at home. She noted that she filled for disability and hoped that would go through and not take too long. The patient denied any suicidal or homicidal thoughts.  ?Duration of problem: Years; Severity of problem: moderate ? ?Patient and/or Family's Strengths/Protective Factors: ?Social connections, Concrete supports in place (healthy food, safe environments, etc.), and Sense of purpose ? ?Goals Addressed: ?Patient will: ? Reduce symptoms of: agitation, anxiety, depression, and stress  ? Increase knowledge and/or ability of: coping skills, healthy habits, self-management skills, and stress reduction  ? Demonstrate ability to: Increase healthy adjustment to current life circumstances ? ?Progress towards Goals: ?Ongoing ? ?Interventions: ?Interventions utilized:  CBT Cognitive Behavioral Therapy was utilized by the clinician during today's follow up session. Clinician met with patient to identify needs related to stressors and functioning, and assess and monitor for signs and symptoms of anxiety and depression, and assess safety. The clinician processed with the patient how they have been doing since the last follow-up session. Clinician measured the patient's anxiety and depression on a numerical scale. Clinician provided the patient with Psychoeducation regarding generalized anxiety and ways to reduce it's impact on her life such as by continuing to work on calming techniques (e.g.. paced breathing, deep muscle relaxation, and calming imagery). Clinician monitored the patient's compliance with the psychotropic medications prescribed by her primary care provider and determined that the patient reports she is taking Cymbalta 30 MG twice a day, trazodone 100 MG at bedtime, and hydroxyzine 25 MG twice daily as needed and finds the medication is helpful. The session ended with scheduling.  ?Standardized Assessments completed: GAD-7 and PHQ 9 ?GAD-7= 14 ?PHQ-9= 15 ? ?Assessment: ?Patient currently experiencing see  above.  ? ?Patient may benefit from see  above. ? ?Plan: ?Follow up with behavioral health clinician on : 12/30/2021 at 2:00 PM ?Behavioral recommendations:  ?Referral(s): [place] (In Clinic) ? ?I discussed the assessment and treatment plan with the patient and/or parent/guardian. They were provided an opportunity to ask questions and all were answered. They agreed with the plan and demonstrated an understanding of the instructions. ?  ?They were advised to call back or seek an in-person evaluation if the symptoms worsen or if the condition fails to improve as anticipated. ? ?[name], LCSWA ?

## 2021-12-21 ENCOUNTER — Other Ambulatory Visit: Payer: Self-pay

## 2021-12-21 ENCOUNTER — Other Ambulatory Visit: Payer: Self-pay | Admitting: Gerontology

## 2021-12-21 DIAGNOSIS — F3341 Major depressive disorder, recurrent, in partial remission: Secondary | ICD-10-CM

## 2021-12-21 DIAGNOSIS — E1140 Type 2 diabetes mellitus with diabetic neuropathy, unspecified: Secondary | ICD-10-CM

## 2021-12-21 MED ORDER — GABAPENTIN 400 MG PO CAPS
ORAL_CAPSULE | ORAL | 0 refills | Status: DC
  Filled 2021-12-21: qty 120, 30d supply, fill #0

## 2021-12-21 MED FILL — Duloxetine HCl Enteric Coated Pellets Cap 30 MG (Base Eq): ORAL | Qty: 120 | Fill #0 | Status: CN

## 2021-12-22 ENCOUNTER — Other Ambulatory Visit: Payer: Self-pay

## 2021-12-23 ENCOUNTER — Other Ambulatory Visit: Payer: Self-pay

## 2021-12-23 ENCOUNTER — Other Ambulatory Visit: Payer: Self-pay | Admitting: Gerontology

## 2021-12-23 DIAGNOSIS — E1140 Type 2 diabetes mellitus with diabetic neuropathy, unspecified: Secondary | ICD-10-CM

## 2021-12-23 MED ORDER — GABAPENTIN 400 MG PO CAPS
ORAL_CAPSULE | ORAL | 0 refills | Status: DC
  Filled 2021-12-23 – 2022-01-22 (×3): qty 120, fill #0
  Filled 2022-01-24: qty 120, 30d supply, fill #0

## 2021-12-27 ENCOUNTER — Other Ambulatory Visit: Payer: Self-pay

## 2021-12-28 ENCOUNTER — Encounter: Payer: Self-pay | Admitting: Gerontology

## 2021-12-28 ENCOUNTER — Ambulatory Visit: Payer: Medicaid Other | Admitting: Gerontology

## 2021-12-28 VITALS — BP 122/80 | HR 92 | Temp 97.7°F | Ht 62.0 in | Wt 185.5 lb

## 2021-12-28 DIAGNOSIS — K921 Melena: Secondary | ICD-10-CM | POA: Insufficient documentation

## 2021-12-28 DIAGNOSIS — R5383 Other fatigue: Secondary | ICD-10-CM

## 2021-12-28 DIAGNOSIS — R399 Unspecified symptoms and signs involving the genitourinary system: Secondary | ICD-10-CM

## 2021-12-28 HISTORY — DX: Melena: K92.1

## 2021-12-28 NOTE — Progress Notes (Signed)
? ?Established Patient Office Visit ? ?Subjective   ?Patient ID: Renee Bush, female    DOB: [birth year]  Age: 61 y.o. MRN: [id number] ? ?Chief Complaint  ?Patient presents with  ? Abdominal Pain  ?  Blood in stool and lower abdominal and back pain for 1 week. Feeling like she has to pee right after she pees.  ? ? ?HPI ? ?Renee Bush is a 61 year old female with history of T2DM, HTN, HLD , Sarcoidosis, presents for routine follow up and c/o blood in stool, back pain that has been going on for 1 week. She reports having tarry and blood in her stool and also blood on the tissue paper when she wipes herself after moving her bowel. She states that it has been going on for one week ago. She denies constipation, stating that her stool is soft. She  also continues to experience urinary frequency, urgency, feeling of incomplete baldder emptying, pelvic and lower back pain that has been going on for 1 week. She denies fever, chills,  dysuria but endorses fatigue. She continues on prednisone taper for Sarcoidosis. She was seen by Cardiologist  Dr End. C on 12/10/21 for PSVT, recommended she continue Metoprolol Tartrate 75 mg bid,and will follow up in 6 months. Overall, she states that she's tired and has not been feeling well since diagnosed with Sarcoidosis. ? ?Review of Systems  ?Constitutional:  Positive for malaise/fatigue.  ?Respiratory: Negative.    ?Cardiovascular: Negative.   ?Gastrointestinal:  Positive for blood in stool.  ?Genitourinary:  Positive for flank pain, frequency and urgency. Negative for dysuria.  ?Musculoskeletal:  Negative for back pain.  ?Psychiatric/Behavioral: Negative.    ? ?  ?Objective:  ?  ? ?BP 122/80 (BP Location: Right Arm, Patient Position: Sitting, Cuff Size: Large)   Pulse 92   Temp 97.7 ?F (36.5 ?C) (Oral)   Ht $R'5\' 2"'EU$  (1.575 m)   Wt 185 lb 8 oz (84.1 kg)   SpO2 94%   BMI 33.93 kg/m?  ?BP Readings from Last 3 Encounters:  ?12/28/21 122/80  ?12/10/21 104/74  ?11/25/21  104/62  ? ?Wt Readings from Last 3 Encounters:  ?12/28/21 185 lb 8 oz (84.1 kg)  ?12/10/21 179 lb (81.2 kg)  ?11/16/21 173 lb 4.8 oz (78.6 kg)  ? ?  ? ?Physical Exam ?HENT:  ?   Head: Normocephalic and atraumatic.  ?   Mouth/Throat:  ?   Mouth: Mucous membranes are moist.  ?Eyes:  ?   Extraocular Movements: Extraocular movements intact.  ?   Pupils: Pupils are equal, round, and reactive to light.  ?Cardiovascular:  ?   Rate and Rhythm: Normal rate and regular rhythm.  ?Pulmonary:  ?   Effort: Pulmonary effort is normal.  ?   Breath sounds: Normal breath sounds.  ?Abdominal:  ?   General: Bowel sounds are normal.  ?   Palpations: Abdomen is soft.  ?   Tenderness: There is right CVA tenderness and left CVA tenderness.  ?Skin: ?   General: Skin is warm.  ?Neurological:  ?   General: No focal deficit present.  ?   Mental Status: She is alert and oriented to person, place, and time.  ?Psychiatric:     ?   Mood and Affect: Mood normal.     ?   Behavior: Behavior normal.  ? ? ? ?Results for orders placed or performed in visit on 12/28/21  ?Microscopic Examination  ? BLD  ?Result Value Ref Range  ?  WBC, UA None seen 0 - 5 /hpf  ? RBC None seen 0 - 2 /hpf  ? Epithelial Cells (non renal) None seen 0 - 10 /hpf  ? Casts None seen None seen /lpf  ? Bacteria, UA None seen None seen/Few  ?TSH  ?Result Value Ref Range  ? TSH WILL FOLLOW   ?Vitamin D (25 hydroxy)  ?Result Value Ref Range  ? Vit D, 25-Hydroxy 20.8 (L) 30.0 - 100.0 ng/mL  ?CBC w/Diff  ?Result Value Ref Range  ? WBC 7.3 3.4 - 10.8 x10E3/uL  ? RBC 4.18 3.77 - 5.28 x10E6/uL  ? Hemoglobin 12.9 11.1 - 15.9 g/dL  ? Hematocrit 38.4 34.0 - 46.6 %  ? MCV 92 79 - 97 fL  ? MCH 30.9 26.6 - 33.0 pg  ? MCHC 33.6 31.5 - 35.7 g/dL  ? RDW 13.0 11.7 - 15.4 %  ? Platelets 203 150 - 450 x10E3/uL  ? Neutrophils 73 Not Estab. %  ? Lymphs 20 Not Estab. %  ? Monocytes 5 Not Estab. %  ? Eos 1 Not Estab. %  ? Basos 0 Not Estab. %  ? Neutrophils Absolute 5.3 1.4 - 7.0 x10E3/uL  ? Lymphocytes  Absolute 1.5 0.7 - 3.1 x10E3/uL  ? Monocytes Absolute 0.4 0.1 - 0.9 x10E3/uL  ? EOS (ABSOLUTE) 0.1 0.0 - 0.4 x10E3/uL  ? Basophils Absolute 0.0 0.0 - 0.2 x10E3/uL  ? Immature Granulocytes 1 Not Estab. %  ? Immature Grans (Abs) 0.1 0.0 - 0.1 x10E3/uL  ?UA/M w/rflx Culture, Routine  ? Specimen: Blood  ? BLD  ?Result Value Ref Range  ? Specific Gravity, UA 1.013 1.005 - 1.030  ? pH, UA 7.0 5.0 - 7.5  ? Color, UA Yellow Yellow  ? Appearance Ur Clear Clear  ? Leukocytes,UA Negative Negative  ? Protein,UA Negative Negative/Trace  ? Glucose, UA 2+ (A) Negative  ? Ketones, UA Negative Negative  ? RBC, UA Negative Negative  ? Bilirubin, UA Negative Negative  ? Urobilinogen, Ur 0.2 0.2 - 1.0 mg/dL  ? Nitrite, UA Negative Negative  ? Microscopic Examination Comment   ? Microscopic Examination See below:   ? Urinalysis Reflex Comment   ? ? ?Last CBC ?Lab Results  ?Component Value Date  ? WBC 7.3 12/28/2021  ? HGB 12.9 12/28/2021  ? HCT 38.4 12/28/2021  ? MCV 92 12/28/2021  ? MCH 30.9 12/28/2021  ? RDW 13.0 12/28/2021  ? PLT 203 12/28/2021  ? ?Last metabolic panel ?Lab Results  ?Component Value Date  ? GLUCOSE 142 (H) 11/08/2021  ? NA 141 11/08/2021  ? K 4.3 11/08/2021  ? CL 105 11/08/2021  ? CO2 23 11/08/2021  ? BUN 23 11/08/2021  ? CREATININE 1.11 (H) 11/08/2021  ? EGFR 57 (L) 11/08/2021  ? CALCIUM 8.9 11/08/2021  ? PROT 6.0 10/06/2021  ? ALBUMIN 4.2 10/06/2021  ? LABGLOB 1.8 10/06/2021  ? AGRATIO 2.3 (H) 10/06/2021  ? BILITOT 0.3 10/06/2021  ? ALKPHOS 60 10/06/2021  ? AST 24 10/06/2021  ? ALT 22 10/06/2021  ? ANIONGAP 12 09/22/2021  ? ?Last lipids ?Lab Results  ?Component Value Date  ? CHOL 163 10/06/2021  ? HDL 40 10/06/2021  ? [place] 96 10/06/2021  ? TRIG 152 (H) 10/06/2021  ? CHOLHDL 4.1 10/06/2021  ? ?Last hemoglobin A1c ?Lab Results  ?Component Value Date  ? HGBA1C 7.3 (H) 10/06/2021  ? ?Last thyroid functions ?Lab Results  ?Component Value Date  ? TSH WILL FOLLOW 12/28/2021  ? ?Last vitamin D ?Lab  Results  ?Component  Value Date  ? VD25OH 20.8 (L) 12/28/2021  ? ?  ? ?The 10-year ASCVD risk score (Arnett DK, et al., 2019) is: 17.6% ? ?  ?Assessment & Plan:  ? ? ?1. Urinary tract infection symptoms ?- Possible UTI, will check urine culture ?- UA/M w/rflx Culture, Routine; Future ?- UA/M w/rflx Culture, Routine ? ?2. Blood in stool ?- She was advised to go to the ED, she declined saying that she is taking care of her 4 grandchildren and unable to go. She reports that she will monitor herself, and will go to the ED if she needs to. Will check Occult blood, cbc to rule out anemia. ?- POCT occult blood stool, device; Future ?- CBC w/Diff; Future ?- CBC w/Diff ? ?3. Other fatigue ?- Will check Vitamin D. ?- Vitamin D (25 hydroxy); Future ?- TSH; Future ?- TSH ?- Vitamin D (25 hydroxy) ? ? ?Return in about 9 days (around 01/06/2022), or if symptoms worsen or fail to improve.  ? ? ?Chapman Matteucci Jerold Coombe, NP ? ?

## 2021-12-29 ENCOUNTER — Other Ambulatory Visit: Payer: Medicaid Other

## 2021-12-29 DIAGNOSIS — R399 Unspecified symptoms and signs involving the genitourinary system: Secondary | ICD-10-CM

## 2021-12-29 LAB — TSH: TSH: 0.87 u[IU]/mL (ref 0.450–4.500)

## 2021-12-29 LAB — UA/M W/RFLX CULTURE, ROUTINE
Bilirubin, UA: NEGATIVE
Ketones, UA: NEGATIVE
Leukocytes,UA: NEGATIVE
Nitrite, UA: NEGATIVE
Protein,UA: NEGATIVE
RBC, UA: NEGATIVE
Specific Gravity, UA: 1.013 (ref 1.005–1.030)
Urobilinogen, Ur: 0.2 mg/dL (ref 0.2–1.0)
pH, UA: 7 (ref 5.0–7.5)

## 2021-12-29 LAB — CBC WITH DIFFERENTIAL/PLATELET
Basophils Absolute: 0 10*3/uL (ref 0.0–0.2)
Basos: 0 %
EOS (ABSOLUTE): 0.1 10*3/uL (ref 0.0–0.4)
Eos: 1 %
Hematocrit: 38.4 % (ref 34.0–46.6)
Hemoglobin: 12.9 g/dL (ref 11.1–15.9)
Immature Grans (Abs): 0.1 10*3/uL (ref 0.0–0.1)
Immature Granulocytes: 1 %
Lymphocytes Absolute: 1.5 10*3/uL (ref 0.7–3.1)
Lymphs: 20 %
MCH: 30.9 pg (ref 26.6–33.0)
MCHC: 33.6 g/dL (ref 31.5–35.7)
MCV: 92 fL (ref 79–97)
Monocytes Absolute: 0.4 10*3/uL (ref 0.1–0.9)
Monocytes: 5 %
Neutrophils Absolute: 5.3 10*3/uL (ref 1.4–7.0)
Neutrophils: 73 %
Platelets: 203 10*3/uL (ref 150–450)
RBC: 4.18 x10E6/uL (ref 3.77–5.28)
RDW: 13 % (ref 11.7–15.4)
WBC: 7.3 10*3/uL (ref 3.4–10.8)

## 2021-12-29 LAB — MICROSCOPIC EXAMINATION
Bacteria, UA: NONE SEEN
Casts: NONE SEEN /lpf
Epithelial Cells (non renal): NONE SEEN /hpf (ref 0–10)
RBC, Urine: NONE SEEN /hpf (ref 0–2)
WBC, UA: NONE SEEN /hpf (ref 0–5)

## 2021-12-29 LAB — VITAMIN D 25 HYDROXY (VIT D DEFICIENCY, FRACTURES): Vit D, 25-Hydroxy: 20.8 ng/mL — ABNORMAL LOW (ref 30.0–100.0)

## 2021-12-30 ENCOUNTER — Ambulatory Visit: Payer: Medicaid Other | Admitting: Licensed Clinical Social Worker

## 2021-12-30 ENCOUNTER — Other Ambulatory Visit: Payer: Self-pay | Admitting: Gerontology

## 2021-12-30 ENCOUNTER — Other Ambulatory Visit: Payer: Self-pay

## 2021-12-30 ENCOUNTER — Telehealth: Payer: Self-pay | Admitting: Gerontology

## 2021-12-30 DIAGNOSIS — E559 Vitamin D deficiency, unspecified: Secondary | ICD-10-CM

## 2021-12-30 LAB — FECAL OCCULT BLOOD, IMMUNOCHEMICAL: Fecal Occult Bld: NEGATIVE

## 2021-12-30 MED ORDER — METOPROLOL TARTRATE 25 MG PO TABS
75.0000 mg | ORAL_TABLET | Freq: Two times a day (BID) | ORAL | 1 refills | Status: DC
  Filled 2021-12-30: qty 180, 30d supply, fill #0
  Filled 2022-02-02: qty 180, 30d supply, fill #1
  Filled 2022-02-02: qty 180, 30d supply, fill #0

## 2021-12-30 MED ORDER — VITAMIN D (ERGOCALCIFEROL) 1.25 MG (50000 UNIT) PO CAPS
50000.0000 [IU] | ORAL_CAPSULE | ORAL | 0 refills | Status: DC
  Filled 2021-12-30: qty 4, 28d supply, fill #0
  Filled 2021-12-30: qty 12, 84d supply, fill #0

## 2021-12-30 MED FILL — Trazodone HCl Tab 100 MG: ORAL | 30 days supply | Qty: 30 | Fill #2 | Status: AC

## 2021-12-30 NOTE — Telephone Encounter (Signed)
Patient was called about her lab result, was advised to pick up 50,000 units of Ergocalciferol. CBC with diff was normal, her aspirin was discontinued, she reports that her stool was dark yesterday, but non today, declines going to the ED, still waiting for the result of stool occult blood test that was sent out yesterday. She was strongly advised to go to the ED. ?

## 2021-12-31 ENCOUNTER — Other Ambulatory Visit: Payer: Self-pay

## 2022-01-05 ENCOUNTER — Other Ambulatory Visit: Payer: Self-pay

## 2022-01-06 ENCOUNTER — Encounter: Payer: Self-pay | Admitting: Gerontology

## 2022-01-06 ENCOUNTER — Ambulatory Visit: Payer: Medicaid Other | Admitting: Gerontology

## 2022-01-06 ENCOUNTER — Other Ambulatory Visit: Payer: Self-pay

## 2022-01-06 ENCOUNTER — Ambulatory Visit: Payer: Self-pay | Admitting: Licensed Clinical Social Worker

## 2022-01-06 ENCOUNTER — Ambulatory Visit: Payer: Medicaid Other | Admitting: Licensed Clinical Social Worker

## 2022-01-06 VITALS — BP 116/76 | HR 79 | Temp 97.8°F | Resp 16 | Ht 62.0 in | Wt 183.8 lb

## 2022-01-06 DIAGNOSIS — F411 Generalized anxiety disorder: Secondary | ICD-10-CM

## 2022-01-06 DIAGNOSIS — Z09 Encounter for follow-up examination after completed treatment for conditions other than malignant neoplasm: Secondary | ICD-10-CM | POA: Insufficient documentation

## 2022-01-06 DIAGNOSIS — Z794 Long term (current) use of insulin: Secondary | ICD-10-CM

## 2022-01-06 DIAGNOSIS — F331 Major depressive disorder, recurrent, moderate: Secondary | ICD-10-CM

## 2022-01-06 HISTORY — DX: Encounter for follow-up examination after completed treatment for conditions other than malignant neoplasm: Z09

## 2022-01-06 LAB — POCT GLYCOSYLATED HEMOGLOBIN (HGB A1C): Hemoglobin A1C: 9.3 % — AB (ref 4.0–5.6)

## 2022-01-06 LAB — GLUCOSE, POCT (MANUAL RESULT ENTRY): POC Glucose: 171 mg/dl — AB (ref 70–99)

## 2022-01-06 NOTE — Patient Instructions (Signed)

## 2022-01-06 NOTE — BH Specialist Note (Signed)
Integrated Behavioral Health via Telemedicine Visit  01/06/2022 [name] [id number]   Referring Provider: Carlyon Shadow, NP Patient/Family location: The patient's home [facility] Provider location: [facility] All persons participating in visit: Renee Bush. Renee Bush and Renee Cairo, LCSW-A Types of Service: Telephone visit  I connected with Renee Bush via  Telephone or Video Enabled Telemedicine Application  (Video is Caregility application) and verified that I am speaking with the correct person using two identifiers. Discussed confidentiality: Yes   I discussed the limitations of telemedicine and the availability of in person appointments.  Discussed there is a possibility of technology failure and discussed alternative modes of communication if that failure occurs.  Patient and/or legal guardian expressed understanding and consented to Telemedicine visit: Yes   Presenting Concerns: Patient and/or family reports the following symptoms/concerns: The patient reports that she has good days and hard days since her last follow-up appointment. She shared that she is struggling with difficulty sleeping and low energy. She explained that she typically falls asleep within an hour, but wakes up frequently. She stated that she did not believe Trazodone 100 MG at bedtime helped her.  The patient noted that she does sleep with the television or fan on most of the time.  She shared that her room is her retreat and she spends time in their to relax and avoid other people.  [name] discussed such other situational and health stressors impacting her life currently.  She noted she is looking forward to feeling better so that she may be able to go with her friends on a beach trip.  The patient denied any suicidal or homicidal thoughts. Duration of problem: Years; Severity of problem: moderate  Patient and/or Family's Strengths/Protective Factors: Concrete supports  in place (healthy food, safe environments, etc.) and Sense of purpose  Goals Addressed: Patient will:  Reduce symptoms of: agitation, anxiety, depression, and stress   Increase knowledge and/or ability of: coping skills, healthy habits, self-management skills, and stress reduction   Demonstrate ability to: Increase healthy adjustment to current life circumstances  Progress towards Goals: Ongoing  Interventions: Interventions utilized:  CBT Cognitive Behavioral Therapywas utilized by the clinician during today's follow up session. Clinician met with patient to identify needs related to stressors and functioning, and assess and monitor for signs and symptoms of anxiety and depression, and assess safety. The clinician processed with the patient how they have been doing since the last follow-up session.  Clinician provided a safe judgment free space for the patient to vent her frustrations regarding her current life circumstances.  Clinician measured the patient's anxiety and depression on a numerical scale.  Clinician assessed the patients pattern of sleep, bedtime routines, activities associated with the bed, activity and energy level while awake, night time snacking, stimulant use, daytime napping, total sleep amounts. Clinician explored the patients thoughts and associated emotions regarding sleep. Clinician used Psychoeducation to provide information to the patient regarding sleep hygiene.  The session ended with scheduling. Standardized Assessments completed: GAD-7 and PHQ 9 GAD-7= 17 PHQ-9= 11   Assessment: Patient currently experiencing see above.   Patient may benefit from see above.  Plan: Follow up with behavioral health clinician on : 01/27/2022 at 2:00 PM  Behavioral recommendations:  Referral(s): [place] (In Clinic)  I discussed the assessment and treatment plan with the patient and/or parent/guardian. They were provided an opportunity to ask  questions and all were answered. They agreed with the plan and demonstrated an  understanding of the instructions.   They were advised to call back or seek an in-person evaluation if the symptoms worsen or if the condition fails to improve as anticipated.  [name], LCSWA

## 2022-01-06 NOTE — Progress Notes (Signed)
? ?Established Patient Office Visit ? ?Subjective   ?Patient ID: Renee Bush, female    DOB: [birth year]  Age: 61 y.o. MRN: [id number] ? ?Chief Complaint  ?Patient presents with  ? Follow-up  ?  Labs drawn 12/28/21 and 12/29/21 ?Seen at [facility] ED on 01/03/22 for back pain. Patient had CT Scan and labs  ? ? ?HPI ?Renee Bush is a 61 year old female with history of T2DM, HTN, HLD , Sarcoidosis, presents for follow up. She was seen at [facility] ED on 01/03/22 for back pain. Prescribed colace, senna, and Miralax . She had Abdominal/Pelvic CT scan done and it showed Minimal skin and subcutaneous soft tissue thickening of the inferior anterior abdominal wall. Correlate with physical exam for possible mild cellulitis. Otherwise no acute finding in the abdomen/pelvis. Currently, she states that her lower back pain has subsided, and she's yet to fill her Flexerill prescription. She states that her bowel is regular, with taking senna. She also reports that she has not seen any blood in her stool or when she cleans herself after moving her bowel. Her HgbA1c done during visit increased from 7.3% to 9.3%, and her blood glucose was 171 mg/dl. She will be completing her prednisone taper in 2 days, adheres to her medications and continues to make healthy lifestyle changes. She denies hypo/hyperglycemic symptoms, states that her peripheral neuropathy is under control with taking gabapentin, performs daily foot checks. Overall, she states that she's doing well and offers no further complaint. ? ? ?Review of Systems  ?Constitutional: Negative.   ?Respiratory: Negative.    ?Cardiovascular: Negative.   ?Neurological: Negative.   ?Endo/Heme/Allergies: Negative.   ?Psychiatric/Behavioral: Negative.    ? ?  ?Objective:  ?  ? ?BP 116/76 (BP Location: Right Arm, Patient Position: Sitting, Cuff Size: Large)   Pulse 79   Temp 97.8 ?F (36.6 ?C) (Oral)   Resp 16   Ht $R'5\' 2"'rl$  (1.575 m)   Wt 183 lb 12.8 oz (83.4 kg)   SpO2 95%   BMI 33.62 kg/m?   ?BP Readings from Last 3 Encounters:  ?01/06/22 116/76  ?12/28/21 122/80  ?12/10/21 104/74  ? ?Wt Readings from Last 3 Encounters:  ?01/06/22 183 lb 12.8 oz (83.4 kg)  ?12/28/21 185 lb 8 oz (84.1 kg)  ?12/10/21 179 lb (81.2 kg)  ? ? Encouraged weight loss ? ?Physical Exam ?HENT:  ?   Head: Normocephalic and atraumatic.  ?   Mouth/Throat:  ?   Mouth: Mucous membranes are moist.  ?Eyes:  ?   Extraocular Movements: Extraocular movements intact.  ?   Conjunctiva/sclera: Conjunctivae normal.  ?   Pupils: Pupils are equal, round, and reactive to light.  ?Cardiovascular:  ?   Rate and Rhythm: Normal rate and regular rhythm.  ?   Pulses: Normal pulses.  ?   Heart sounds: Normal heart sounds.  ?Pulmonary:  ?   Effort: Pulmonary effort is normal.  ?   Breath sounds: Normal breath sounds.  ?Abdominal:  ?   General: Bowel sounds are normal. There is no distension.  ?   Palpations: Abdomen is soft.  ?   Tenderness: There is no abdominal tenderness. There is no guarding.  ?Musculoskeletal:     ?   General: Normal range of motion.  ?Skin: ?   General: Skin is warm.  ?Neurological:  ?   General: No focal deficit present.  ?   Mental Status: She is alert and oriented to person, place, and time. Mental status is  at baseline.  ?Psychiatric:     ?   Mood and Affect: Mood normal.     ?   Behavior: Behavior normal.     ?   Thought Content: Thought content normal.     ?   Judgment: Judgment normal.  ? ? ? ?Results for orders placed or performed in visit on 01/06/22  ?POCT Glucose (CBG)  ?Result Value Ref Range  ? POC Glucose 171 (A) 70 - 99 mg/dl  ?POCT HgB A1C  ?Result Value Ref Range  ? Hemoglobin A1C 9.3 (A) 4.0 - 5.6 %  ? HbA1c POC (<> result, manual entry)    ? HbA1c, POC (prediabetic range)    ? HbA1c, POC (controlled diabetic range)    ? ? ?Last CBC ?Lab Results  ?Component Value Date  ? WBC 7.3 12/28/2021  ? HGB 12.9 12/28/2021  ? HCT 38.4 12/28/2021  ? MCV 92 12/28/2021  ? MCH 30.9 12/28/2021  ? RDW 13.0 12/28/2021  ? PLT 203  12/28/2021  ? ?Last metabolic panel ?Lab Results  ?Component Value Date  ? GLUCOSE 142 (H) 11/08/2021  ? NA 141 11/08/2021  ? K 4.3 11/08/2021  ? CL 105 11/08/2021  ? CO2 23 11/08/2021  ? BUN 23 11/08/2021  ? CREATININE 1.11 (H) 11/08/2021  ? EGFR 57 (L) 11/08/2021  ? CALCIUM 8.9 11/08/2021  ? PROT 6.0 10/06/2021  ? ALBUMIN 4.2 10/06/2021  ? LABGLOB 1.8 10/06/2021  ? AGRATIO 2.3 (H) 10/06/2021  ? BILITOT 0.3 10/06/2021  ? ALKPHOS 60 10/06/2021  ? AST 24 10/06/2021  ? ALT 22 10/06/2021  ? ANIONGAP 12 09/22/2021  ? ?Last lipids ?Lab Results  ?Component Value Date  ? CHOL 163 10/06/2021  ? HDL 40 10/06/2021  ? [place] 96 10/06/2021  ? TRIG 152 (H) 10/06/2021  ? CHOLHDL 4.1 10/06/2021  ? ?Last hemoglobin A1c ?Lab Results  ?Component Value Date  ? HGBA1C 9.3 (A) 01/06/2022  ? ?Last thyroid functions ?Lab Results  ?Component Value Date  ? TSH 0.870 12/28/2021  ? ?  ? ?The 10-year ASCVD risk score (Arnett DK, et al., 2019) is: 8.1% ? ?  ?Assessment & Plan:  ? ?1. Type 2 diabetes mellitus with diabetic neuropathy, with long-term current use of insulin ([place]) ?- Her diabetes is not under control, her HgbA1c was 9.3%, her goal should be less than 7%. She will continue current medication, low carb/non concentrated sweet diet and exercise as tolerated. ?- POCT HgB A1C; Future ?- POCT Glucose (CBG); Future ?- POCT Glucose (CBG) ?- POCT HgB A1C ? ?2. Hospital discharge follow-up ?- She was encouraged to follow discharge instructions. She was advised to go back to the ED with worsening symptoms. ? ? ?Return in about 13 weeks (around 04/07/2022), or if symptoms worsen or fail to improve.  ? ? ?Evelin Cake Jerold Coombe, NP ? ?

## 2022-01-07 ENCOUNTER — Other Ambulatory Visit: Payer: Self-pay

## 2022-01-07 MED ORDER — CYCLOBENZAPRINE HCL 10 MG PO TABS
10.0000 mg | ORAL_TABLET | ORAL | 0 refills | Status: DC
  Filled 2022-01-07: qty 20, 10d supply, fill #0

## 2022-01-11 ENCOUNTER — Other Ambulatory Visit: Payer: Self-pay

## 2022-01-11 ENCOUNTER — Other Ambulatory Visit: Payer: Self-pay | Admitting: Gerontology

## 2022-01-11 DIAGNOSIS — F3341 Major depressive disorder, recurrent, in partial remission: Secondary | ICD-10-CM

## 2022-01-11 MED ORDER — TRAZODONE HCL 150 MG PO TABS
150.0000 mg | ORAL_TABLET | Freq: Every day | ORAL | 0 refills | Status: DC
  Filled 2022-01-11 – 2022-02-02 (×3): qty 30, 30d supply, fill #0

## 2022-01-12 ENCOUNTER — Other Ambulatory Visit: Payer: Self-pay

## 2022-01-19 ENCOUNTER — Encounter: Payer: Self-pay | Admitting: Pulmonary Disease

## 2022-01-19 ENCOUNTER — Ambulatory Visit (INDEPENDENT_AMBULATORY_CARE_PROVIDER_SITE_OTHER): Payer: Self-pay | Admitting: Pulmonary Disease

## 2022-01-19 VITALS — BP 128/68 | HR 87 | Temp 97.8°F | Ht 62.0 in | Wt 186.0 lb

## 2022-01-19 DIAGNOSIS — D869 Sarcoidosis, unspecified: Secondary | ICD-10-CM

## 2022-01-19 DIAGNOSIS — R0602 Shortness of breath: Secondary | ICD-10-CM

## 2022-01-19 DIAGNOSIS — I471 Supraventricular tachycardia: Secondary | ICD-10-CM

## 2022-01-19 DIAGNOSIS — Z794 Long term (current) use of insulin: Secondary | ICD-10-CM

## 2022-01-19 DIAGNOSIS — E1140 Type 2 diabetes mellitus with diabetic neuropathy, unspecified: Secondary | ICD-10-CM

## 2022-01-19 NOTE — Patient Instructions (Signed)
We are going to get another CT of the chest to evaluate your lymph nodes.  We will see you in follow-up in 2 months time call sooner should any new problems arise.

## 2022-01-19 NOTE — Progress Notes (Signed)
Subjective:    Patient ID: Renee Bush, female    DOB: September 11, 1960, 61 y.o.   MRN: [id number] Patient Care Team: Rolm Gala, NP as PCP - General (Gerontology)  Chief Complaint  Patient presents with   Follow-up    SOB with exertion and occ dry cough.    HPI This is a 61 year old remote smoker with a history as noted below who presents for follow-up of  abnormal CT chest with mediastinal and hilar adenopathy, hypermetabolic lymphadenopathy on PET scan and nonnecrotizing granulomatous inflammation on right supraclavicular lymph node biopsy all consistent with sarcoidosis.  Patient was initially seen by me on 12 October 2021 and subsequently saw Rubye Oaks, NP on 12 November 2021.  Call that adenopathy was first noted on September 22, 2021 when she went to the emergency room due to chest tightness and palpitations.  She underwent a CT angio chest that was negative for pulmonary embolism however it showed mediastinal and right hilar adenopathy.  Subsequently she had a PET/CT done on 29 September 2021 that showed hypermetabolic lymphadenopathy with both hilar and mediastinal regions involved as well as right supraclavicular region and left thoracic inlet.  The patient was evaluated by oncology and subsequently referred for biopsy of the supraclavicular lymph node on 04 October 2021.  This showed nonnecrotizing granulomas consistent with sarcoidosis.  She initially was evaluated for consideration of EBUS however pathology from the supraclavicular lymph node was consistent with noncaseating granulomas as seen in sarcoidosis.  Patient was started on low-dose prednisone at 10 mg given her issues with poorly controlled diabetes.  On subsequent visit with NP Parrett plan for weaning completely off prednisone was instituted and the patient has now weaned off of this medication.  The patient did not note much change on her symptoms on the prednisone.  The patient has noted that she has had chest  pain since August 2022.  She has also had issues with shortness of breath for the last several months prior to her February evaluation.  She also notes arthralgias myalgias and arm and shoulder pain bilaterally.  She has not had any fevers, chills or sweats but has noted some weight loss without anorexia.  He also notes dry cough and intermittent wheezing on most days.  She has not had any hemoptysis, no orthopnea or paroxysmal nocturnal dyspnea.  No lower extremity edema or calf tenderness.  She does not note any rashes.  Does not endorse any other symptomatology.  TEST/EVENTS  October 04, 2021 Right supraclavicular lymph node biopsy showed nonnecrotizing granulomatous inflammation  September 22, 2021 CT chest negative for PE, mediastinal and right hilar adenopathy small nodes are present at the porta hepatis,  September 29, 2021 PET scan showed hypermetabolic lymphadenopathy in both hilar regions in the mediastinal and associated hypermetabolic lymph nodes in the right supraclavicular region and left thoracic inlet upper portal lymph nodes in the abdomen show low-level hypermetabolism October 29, 2021 2D echo showed EF at 60-65%, grade 1 diastolic dysfunction, normal pulmonary artery systolic pressure, normal right ventricular size.  October 28, 2021 PFTs showed normal lung function with mild bronchodilator response FEV1 101%, ratio 82, FVC 95%, 10% bronchodilator change, DLCO 104%.  Review of Systems A 10 point review of systems was performed and it is as noted above otherwise negative.  Patient Active Problem List   Diagnosis Date Noted   Iron deficiency anemia 10/10/2022   Moderate episode of recurrent major depressive disorder (HCC) 10/10/2022   Sarcoidosis 11/12/2021   Paroxysmal  SVT (supraventricular tachycardia) 10/28/2021   Bilateral edema of lower extremity 05/16/2019   Generalized anxiety disorder 02/07/2019   Insomnia 10/06/2017   GERD (gastroesophageal reflux disease) 10/06/2017   Chronic  kidney disease, stage III (moderate) (HCC) 10/06/2017   B12 deficiency 06/09/2016   Hyperlipidemia associated with type 2 diabetes mellitus (HCC) 05/05/2016   Type 2 diabetes mellitus with diabetic neuropathy, with long-term current use of insulin (HCC) 09/10/2015   Essential hypertension 09/10/2015   Social History   Tobacco Use   Smoking status: Former    Packs/day: 1.00    Years: 5.00    Additional pack years: 0.00    Total pack years: 5.00    Types: Cigarettes    Quit date: 04/17/2005    Years since quitting: 17.7   Smokeless tobacco: Never  Substance Use Topics   Alcohol use: Not Currently    Alcohol/week: 14.0 standard drinks of alcohol    Types: 14 Cans of beer per week    Comment: quit ~2020   Allergies  Allergen Reactions   Etodolac Rash   Penicillin G Rash   Penicillins Rash    Has patient had a PCN reaction causing immediate rash, facial/tongue/throat swelling, SOB or lightheadedness with hypotension: Yes Has patient had a PCN reaction causing severe rash involving mucus membranes or skin necrosis: No Has patient had a PCN reaction that required hospitalization: No Has patient had a PCN reaction occurring within the last 10 years: No If all of the above answers are "NO", then may proceed with Cephalosporin use.   Medications were reviewed with the patient and are as noted.  Immunization History  Administered Date(s) Administered   Influenza,inj,Quad PF,6+ Mos 05/15/2019, 06/01/2022   Influenza-Unspecified 06/04/2015, 06/10/2016, 06/08/2017, 05/25/2018, 06/01/2020, 05/20/2021   PFIZER(Purple Top)SARS-COV-2 Vaccination 12/02/2019, 12/23/2019   Pneumococcal Polysaccharide-23 05/16/2019   Tdap 06/04/2015       Objective:   Physical Exam BP 128/68 (BP Location: Left Arm, Cuff Size: Normal)   Pulse 87   Temp 97.8 F (36.6 C) (Temporal)   Ht 5\' 2"  (1.575 m)   Wt 186 lb (84.4 kg)   SpO2 99%   BMI 34.02 kg/m   SpO2: 99 % O2 Device: None (Room  air)  GENERAL: Well-developed, overweight woman, no acute distress.  No conversational dyspnea, fully ambulatory. HEAD: Normocephalic, atraumatic.  EYES: Pupils equal, round, reactive to light.  No scleral icterus.  MOUTH: Poor dentition, oral mucosa moist.  No thrush. NECK: Supple. No thyromegaly. Trachea midline. No JVD.  Mild supraclavicular adenopathy, right. PULMONARY: Good air entry bilaterally.,  Otherwise, no adventitious sounds. CARDIOVASCULAR: S1 and S2. Regular rate and rhythm.  No rubs, murmurs or gallops heard. ABDOMEN: Benign. MUSCULOSKELETAL: No joint deformity, no clubbing, no edema.  NEUROLOGIC: No overt focal deficit, no gait disturbance, speech is fluent. SKIN: Intact,warm,dry. PSYCH: Affect flat, passive behavior.      Assessment & Plan:     ICD-10-CM   1. Sarcoidosis  D86.9 CT Chest W Contrast   Reassess with CT chest    2. Type 2 diabetes mellitus with diabetic neuropathy, with long-term current use of insulin (HCC)  E11.40    Z79.4    This issue adds complexity to her management Cannot use steroids for treatment of sarcoidosis Poor glycemic control even on low-dose steroids    3. SOB (shortness of breath)  R06.02    Reassessing sarcoid activity Multifactorial Notes when having palpitations (SVT)    4. SVT (supraventricular tachycardia) (HCC)  I47.1  This issue adds complexity to her management Following with cardiology     Orders Placed This Encounter  Procedures   CT Chest W Contrast    Standing Status:   Future    Number of Occurrences:   1    Standing Expiration Date:   01/20/2023    Order Specific Question:   If indicated for the ordered procedure, I authorize the administration of contrast media per Radiology protocol    Answer:   Yes    Order Specific Question:   Preferred imaging location?    Answer:   [facility]   Will see the patient in follow-up in 2 months time she is to call sooner should any new problems arise.  Gailen Shelter, MD Advanced Bronchoscopy PCCM [facility]    *This note was dictated using voice recognition software/Dragon.  Despite best efforts to proofread, errors can occur which can change the meaning. Any transcriptional errors that result from this process are unintentional and may not be fully corrected at the time of dictation.

## 2022-01-25 ENCOUNTER — Other Ambulatory Visit: Payer: Self-pay

## 2022-01-26 ENCOUNTER — Other Ambulatory Visit: Payer: Self-pay

## 2022-01-27 ENCOUNTER — Ambulatory Visit: Payer: Medicaid Other | Admitting: Licensed Clinical Social Worker

## 2022-02-01 ENCOUNTER — Emergency Department: Payer: Medicaid Other

## 2022-02-01 ENCOUNTER — Other Ambulatory Visit: Payer: Self-pay

## 2022-02-01 ENCOUNTER — Emergency Department
Admission: EM | Admit: 2022-02-01 | Discharge: 2022-02-01 | Disposition: A | Discharge status: Home / Self Care | Payer: Medicaid Other | Attending: Emergency Medicine | Admitting: Emergency Medicine

## 2022-02-01 ENCOUNTER — Ambulatory Visit: Payer: Medicaid Other | Admitting: Licensed Clinical Social Worker

## 2022-02-01 ENCOUNTER — Telehealth: Payer: Self-pay | Admitting: Emergency Medicine

## 2022-02-01 DIAGNOSIS — I872 Venous insufficiency (chronic) (peripheral): Secondary | ICD-10-CM | POA: Diagnosis not present

## 2022-02-01 DIAGNOSIS — M7989 Other specified soft tissue disorders: Secondary | ICD-10-CM | POA: Diagnosis present

## 2022-02-01 DIAGNOSIS — E119 Type 2 diabetes mellitus without complications: Secondary | ICD-10-CM | POA: Insufficient documentation

## 2022-02-01 DIAGNOSIS — I129 Hypertensive chronic kidney disease with stage 1 through stage 4 chronic kidney disease, or unspecified chronic kidney disease: Secondary | ICD-10-CM | POA: Insufficient documentation

## 2022-02-01 DIAGNOSIS — N189 Chronic kidney disease, unspecified: Secondary | ICD-10-CM | POA: Diagnosis not present

## 2022-02-01 DIAGNOSIS — R609 Edema, unspecified: Secondary | ICD-10-CM

## 2022-02-01 DIAGNOSIS — R600 Localized edema: Secondary | ICD-10-CM | POA: Diagnosis not present

## 2022-02-01 LAB — CBC WITH DIFFERENTIAL/PLATELET
Abs Immature Granulocytes: 0.04 10*3/uL (ref 0.00–0.07)
Basophils Absolute: 0 10*3/uL (ref 0.0–0.1)
Basophils Relative: 0 %
Eosinophils Absolute: 0 10*3/uL (ref 0.0–0.5)
Eosinophils Relative: 1 %
HCT: 39.9 % (ref 36.0–46.0)
Hemoglobin: 13.1 g/dL (ref 12.0–15.0)
Immature Granulocytes: 1 %
Lymphocytes Relative: 14 %
Lymphs Abs: 1.2 10*3/uL (ref 0.7–4.0)
MCH: 29.2 pg (ref 26.0–34.0)
MCHC: 32.8 g/dL (ref 30.0–36.0)
MCV: 88.9 fL (ref 80.0–100.0)
Monocytes Absolute: 0.7 10*3/uL (ref 0.1–1.0)
Monocytes Relative: 8 %
Neutro Abs: 6.6 10*3/uL (ref 1.7–7.7)
Neutrophils Relative %: 76 %
Platelets: 235 10*3/uL (ref 150–400)
RBC: 4.49 MIL/uL (ref 3.87–5.11)
RDW: 12.1 % (ref 11.5–15.5)
WBC: 8.6 10*3/uL (ref 4.0–10.5)
nRBC: 0 % (ref 0.0–0.2)

## 2022-02-01 LAB — COMPREHENSIVE METABOLIC PANEL
ALT: 10 U/L (ref 0–44)
AST: 16 U/L (ref 15–41)
Albumin: 3.1 g/dL — ABNORMAL LOW (ref 3.5–5.0)
Alkaline Phosphatase: 58 U/L (ref 38–126)
Anion gap: 9 (ref 5–15)
BUN: 15 mg/dL (ref 8–23)
CO2: 28 mmol/L (ref 22–32)
Calcium: 9.2 mg/dL (ref 8.9–10.3)
Chloride: 100 mmol/L (ref 98–111)
Creatinine, Ser: 1.07 mg/dL — ABNORMAL HIGH (ref 0.44–1.00)
GFR, Estimated: 59 mL/min — ABNORMAL LOW (ref >60)
Glucose, Bld: 227 mg/dL — ABNORMAL HIGH (ref 70–99)
Potassium: 3.9 mmol/L (ref 3.5–5.1)
Sodium: 137 mmol/L (ref 135–145)
Total Bilirubin: 0.5 mg/dL (ref 0.3–1.2)
Total Protein: 6.9 g/dL (ref 6.5–8.1)

## 2022-02-01 LAB — BRAIN NATRIURETIC PEPTIDE: B Natriuretic Peptide: 46.8 pg/mL (ref 0.0–100.0)

## 2022-02-01 NOTE — Telephone Encounter (Signed)
Patient called in today c/o bilateral feet swelling so bad that she can hardly walk and right hand swelling and patient states she cannot close her right hand.   Spoke with Lanora Manis, NP, she advised to go to ED for evaluation since we do not have labs or xray on site.   Advised patient of above. She voiced understanding and agreement.

## 2022-02-01 NOTE — ED Provider Triage Note (Signed)
  Emergency Medicine Provider Triage Evaluation Note  Renee Bush , a 61 y.o.female,  was evaluated in triage.  Pt complains of bilateral leg swelling x1 week.  Markedly painful with palpation below the calfs  Review of Systems  Positive: Bilateral leg swelling Negative: Denies fever, chest pain, vomiting  Physical Exam   Vitals:   02/01/22 1153 02/01/22 1155  BP: 125/71   Pulse: 99   Resp: 17   Temp:  98.4 F (36.9 C)  SpO2: 99%    Gen:   Awake, no distress   Resp:  Normal effort  MSK:   Moves extremities without difficulty  Other:  2+ pitting edema bilaterally.  Both legs are tender to touch, diffusely.  Medical Decision Making  Given the patient's initial medical screening exam, the following diagnostic evaluation has been ordered. The patient will be placed in the appropriate treatment space, once one is available, to complete the evaluation and treatment. I have discussed the plan of care with the patient and I have advised the patient that an ED physician or mid-level practitioner will reevaluate their condition after the test results have been received, as the results may give them additional insight into the type of treatment they may need.    Diagnostics: Labs, EKG, CXR, ultrasound DVTs  Treatments: none immediately   Varney Daily, [place] 02/01/22 1158

## 2022-02-01 NOTE — ED Notes (Signed)
See triage note  presents with swelling to BLE and right arm  sxs' started last week   No SOB noted denies any pain

## 2022-02-01 NOTE — ED Triage Notes (Signed)
Pt c/o BL LE edema and right arm swelling since the middle of last week. Denies SOB or chest pain. Pt is in NAD at present

## 2022-02-01 NOTE — ED Provider Notes (Signed)
[facility] Provider Note    Event Date/Time   First MD Initiated Contact with Patient 02/01/22 1340     (approximate)   History   Chief Complaint Leg Swelling   HPI  Renee Bush is a 61 y.o. female with past medical history of hypertension, hyperlipidemia, diabetes, CKD, sarcoidosis, and alcohol abuse who presents to the ED complaining of leg swelling.  Patient reports that she has been dealing with increasing swelling to both of her legs for the past 2 weeks.  She states that primarily affects her feet and ankles, but she denies any significant trauma to her legs.  She states that her legs have begun to be painful due to the amount of swelling.  She has not noticed any skin changes to the area.  She additionally complains of pain and swelling around her right wrist but denies any associated trauma.  She deals with chronic difficulty breathing that is no worse than usual, denies any pain in her chest.  She has never had similar symptoms in the past, denies any history of DVT/PE.     Physical Exam   Triage Vital Signs: ED Triage Vitals  Enc Vitals Group     BP 02/01/22 1153 125/71     Pulse Rate 02/01/22 1153 99     Resp 02/01/22 1153 17     Temp 02/01/22 1155 98.4 F (36.9 C)     Temp Source 02/01/22 1153 Oral     SpO2 02/01/22 1153 99 %     Weight 02/01/22 1155 180 lb (81.6 kg)     Height 02/01/22 1155 5\' 2"  (1.575 m)     Head Circumference --      Peak Flow --      Pain Score 02/01/22 1154 10     Pain Loc --      Pain Edu? --      Excl. in GC? --     Most recent vital signs: Vitals:   02/01/22 1153 02/01/22 1155  BP: 125/71   Pulse: 99   Resp: 17   Temp:  98.4 F (36.9 C)  SpO2: 99%     Constitutional: Alert and oriented. Eyes: Conjunctivae are normal. Head: Atraumatic. Nose: No congestion/rhinnorhea. Mouth/Throat: Mucous membranes are moist.  Cardiovascular: Normal rate, regular rhythm. Grossly normal heart sounds.  2+  radial and DP pulses bilaterally. Respiratory: Normal respiratory effort.  No retractions. Lungs CTAB. Gastrointestinal: Soft and nontender. No distention. Musculoskeletal: 2+ pitting edema to lower shins, ankles, and feet bilaterally with mild associated tenderness but no erythema, warmth, or fluctuance.  No edema or tenderness noted at right forearm or wrist, range of motion intact to right wrist without pain. Neurologic:  Normal speech and language. No gross focal neurologic deficits are appreciated.    ED Results / Procedures / Treatments   Labs (all labs ordered are listed, but only abnormal results are displayed) Labs Reviewed  COMPREHENSIVE METABOLIC PANEL - Abnormal; Notable for the following components:      Result Value   Glucose, Bld 227 (*)    Creatinine, Ser 1.07 (*)    Albumin 3.1 (*)    GFR, Estimated 59 (*)    All other components within normal limits  CBC WITH DIFFERENTIAL/PLATELET  BRAIN NATRIURETIC PEPTIDE     EKG  ED ECG REPORT I, 04/03/22, the attending physician, personally viewed and interpreted this ECG.   Date: 02/01/2022  EKG Time: 11:56  Rate: 95  Rhythm: normal sinus  rhythm  Axis: Normal  Intervals:none  ST&T Change: None  RADIOLOGY Chest x-ray reviewed and interpreted by me with no infiltrate, edema, or effusion.  PROCEDURES:  Critical Care performed: No  Procedures   MEDICATIONS ORDERED IN ED: Medications - No data to display   IMPRESSION / MDM / ASSESSMENT AND PLAN / ED COURSE  I reviewed the triage vital signs and the nursing notes.                              61 y.o. female with past medical history of hypertension, hyperlipidemia, diabetes, CKD, alcohol abuse, and sarcoidosis who presents to the ED complaining of bilateral lower extremity pain and swelling for the past [redacted] weeks along with some pain in her right wrist.  Patient's presentation is most consistent with acute presentation with potential threat to life or  bodily function.  Differential diagnosis includes, but is not limited to, DVT, CHF, arthritis, bony injury, cellulitis, arterial insufficiency, venous insufficiency.  Patient well-appearing and in no acute distress, vital signs are unremarkable and she is neurovascular intact to her bilateral lower extremities.  No evidence of CHF on chest x-ray, BNP within normal limits.  Ultrasound of her lower extremities is negative for DVT.  No evidence of traumatic injury to either her legs or her right arm and no significant swelling or tenderness at right arm to raise suspicion for DVT.  No findings of cellulitis or other infectious process.  Additional labs are reassuring showing stable renal function without no electrolyte abnormality, no significant anemia or leukocytosis noted.  Suspect symptoms are due to venous insufficiency and patient is appropriate for discharge home with PCP follow-up, was counseled to use compression stockings and return to the ED for new or worsening symptoms.  Patient agrees with plan.      FINAL CLINICAL IMPRESSION(S) / ED DIAGNOSES   Final diagnoses:  Peripheral edema  Venous insufficiency     Rx / DC Orders   ED Discharge Orders     None        Note:  This document was prepared using Dragon voice recognition software and may include unintentional dictation errors.   Chesley Noon, MD 02/01/22 1436

## 2022-02-02 ENCOUNTER — Other Ambulatory Visit: Payer: Self-pay | Admitting: Gerontology

## 2022-02-02 ENCOUNTER — Other Ambulatory Visit: Payer: Self-pay

## 2022-02-02 MED ORDER — BASAGLAR KWIKPEN 100 UNIT/ML ~~LOC~~ SOPN
PEN_INJECTOR | SUBCUTANEOUS | 4 refills | Status: DC
  Filled 2022-02-02: qty 15, fill #0
  Filled 2022-02-02 – 2022-02-24 (×2): qty 15, 30d supply, fill #0
  Filled 2022-02-24: qty 15, 90d supply, fill #1

## 2022-02-02 MED FILL — Duloxetine HCl Enteric Coated Pellets Cap 30 MG (Base Eq): ORAL | Qty: 120 | Fill #0 | Status: CN

## 2022-02-02 MED FILL — Duloxetine HCl Enteric Coated Pellets Cap 30 MG (Base Eq): ORAL | 30 days supply | Qty: 120 | Fill #0 | Status: AC

## 2022-02-02 MED FILL — Hydroxyzine HCl Tab 25 MG: ORAL | 30 days supply | Qty: 60 | Fill #0 | Status: AC

## 2022-02-02 MED FILL — Hydroxyzine HCl Tab 25 MG: ORAL | 30 days supply | Qty: 60 | Fill #2 | Status: CN

## 2022-02-03 ENCOUNTER — Other Ambulatory Visit: Payer: Self-pay

## 2022-02-08 ENCOUNTER — Ambulatory Visit
Admission: RE | Admit: 2022-02-08 | Discharge: 2022-02-08 | Disposition: A | Discharge status: Home / Self Care | Payer: Medicaid Other | Source: Ambulatory Visit | Attending: Pulmonary Disease | Admitting: Pulmonary Disease

## 2022-02-08 ENCOUNTER — Ambulatory Visit: Payer: Medicaid Other | Admitting: Licensed Clinical Social Worker

## 2022-02-08 ENCOUNTER — Ambulatory Visit: Payer: Self-pay | Admitting: Licensed Clinical Social Worker

## 2022-02-08 DIAGNOSIS — D869 Sarcoidosis, unspecified: Secondary | ICD-10-CM | POA: Diagnosis present

## 2022-02-08 DIAGNOSIS — F331 Major depressive disorder, recurrent, moderate: Secondary | ICD-10-CM

## 2022-02-08 DIAGNOSIS — F411 Generalized anxiety disorder: Secondary | ICD-10-CM

## 2022-02-08 MED ORDER — IOHEXOL 300 MG/ML  SOLN
75.0000 mL | Freq: Once | INTRAMUSCULAR | Status: AC | PRN
Start: 2022-02-08 — End: 2022-02-08
  Administered 2022-02-08: 75 mL via INTRAVENOUS

## 2022-02-08 NOTE — BH Specialist Note (Signed)
Integrated Behavioral Health via Telemedicine Visit  02/08/2022 XAYLA PUZIO [id number]   Referring Provider: Carlyon Shadow, NP  Patient/Family location: The Patient's home  [facility] Provider location: [facility] [place] All persons participating in visit: Sondos Wolfman. Ebony Hail and Jerrilyn Cairo, LCSW-A Types of Service: Telephone visit  I connected with [name] via  Telephone or Video Enabled Telemedicine Application  (Video is Caregility application) and verified that I am speaking with the correct person using two identifiers. Discussed confidentiality: Yes   I discussed the limitations of telemedicine and the availability of in person appointments.  Discussed there is a possibility of technology failure and discussed alternative modes of communication if that failure occurs.  Patient and/or legal guardian expressed understanding and consented to Telemedicine visit: Yes   Presenting Concerns: Patient and/or family reports the following symptoms/concerns: The patient reports that she has been doing about the same since her last  follow-up appointment. Ms. Morel shared that she has been experiencing significant swelling in her feet and legs for a couple of weeks now. She explained that she rescheduled her last follow-up IBH appointment because she went to the Emergency Department because her legs were so swollen and painful. She noted that they gave her compression hose and told her to follow up with her primary care provider or to return if it got worse. Ms. Bielak noted that she was frustrated that they did not give her any medication to help reduce the swelling. She shared that she is anxiously awaiting results from her latest CT scan. She discussed other health related stressors impacting her life currently. She noted that she was disappointed that she had to miss a beach trip with her friends because of her health. She stated she is hopeful that she will  be well enough to join them on their next beach trip in July. The patient stated she continues to take Trazodone 150 MG at bedtime and has noticed some improvement in her ability to fall asleep. Ms. Maves reported that she continues to take hydroxyzine 25 MG twice daily as needed for anxiety, and Duloxetine (CYMBALTA) 60 MG twice daily as prescribed. She noted that she has not been focusing on her self care over the last couple of months because The patient denied any suicidal or homicidal thoughts.  Duration of problem: Years; Severity of problem: moderate  Patient and/or Family's Strengths/Protective Factors: Social connections, Concrete supports in place (healthy food, safe environments, etc.), and Sense of purpose  Goals Addressed: Patient will:  Reduce symptoms of: agitation, anxiety, depression, insomnia, and stress   Increase knowledge and/or ability of: coping skills, healthy habits, self-management skills, and stress reduction   Demonstrate ability to: Increase healthy adjustment to current life circumstances  Progress towards Goals: Ongoing  Interventions: Interventions utilized:  CBT Cognitive Behavioral Therapy was utilized by the clinician during today's follow up session. Clinician met with patient to identify needs related to stressors and functioning, and assess and monitor for signs and symptoms of anxiety and depression, and assess safety. The clinician processed with the patient how they have been doing since the last follow-up session. Clinician measured the patient's anxiety and depression on a numerical scale. Clinician intervened with positive regard and optimism to validate patient's emotions, and supported the patient in exploring ways to manage her anxiety. The Clinician encouraged the patient to express their feelings and thoughts openly, creating a safe and non judgmental space for th patient to share. Together Clinician and Patient brainstormed several  coping strategies  such as deep breathing, increasing activities that bring joy and increasing the patient's focus on her self care.  The session ended with scheduling.  Standardized Assessments completed: GAD-7 and PHQ 9 GAD-7=  17 PHQ-9=  16  Assessment: Patient currently experiencing see above.   Patient may benefit from see above.  Plan: Follow up with behavioral health clinician on : August 2023 Behavioral recommendations:  Referral(s): [facility] (In Clinic)  I discussed the assessment and treatment plan with the patient and/or parent/guardian. They were provided an opportunity to ask questions and all were answered. They agreed with the plan and demonstrated an understanding of the instructions.   They were advised to call back or seek an in-person evaluation if the symptoms worsen or if the condition fails to improve as anticipated.  [name], LCSWA

## 2022-02-10 ENCOUNTER — Encounter: Payer: Self-pay | Admitting: Gerontology

## 2022-02-10 ENCOUNTER — Ambulatory Visit: Payer: Medicaid Other | Admitting: Gerontology

## 2022-02-10 VITALS — BP 108/70 | HR 86 | Temp 98.0°F | Resp 16 | Ht 62.0 in | Wt 184.0 lb

## 2022-02-10 DIAGNOSIS — M7989 Other specified soft tissue disorders: Secondary | ICD-10-CM | POA: Insufficient documentation

## 2022-02-10 DIAGNOSIS — R600 Localized edema: Secondary | ICD-10-CM

## 2022-02-10 NOTE — Patient Instructions (Signed)
Edema ? ?Edema is when you have too much fluid in your body or under your skin. Edema may make your legs, feet, and ankles swell. Swelling often happens in looser tissues, such as around your eyes. This is a common condition. It gets more common as you get older. ?There are many possible causes of edema. These include: ?Eating too much salt (sodium). ?Being on your feet or sitting for a long time. ?Certain medical conditions, such as: ?Pregnancy. ?Heart failure. ?Liver disease. ?Kidney disease. ?Cancer. ?Hot weather may make edema worse. Edema is usually painless. Your skin may look swollen or shiny. ?Follow these instructions at home: ?Medicines ?Take over-the-counter and prescription medicines only as told by your doctor. ?Your doctor may prescribe a medicine to help your body get rid of extra water (diuretic). Take this medicine if you are told to take it. ?Eating and drinking ?Eat a low-salt (low-sodium) diet as told by your doctor. Sometimes, eating less salt may reduce swelling. ?Depending on the cause of your swelling, you may need to limit how much fluid you drink (fluid restriction). ?General instructions ?Raise the injured area above the level of your heart while you are sitting or lying down. ?Do not sit still or stand for a long time. ?Do not wear tight clothes. Do not wear garters on your upper legs. ?Exercise your legs. This can help the swelling go down. ?Wear compression stockings as told by your doctor. It is important that these are the right size. These should be prescribed by your doctor to prevent possible injuries. ?If elastic bandages or wraps are recommended, use them as told by your doctor. ?Contact a doctor if: ?Treatment is not working. ?You have heart, liver, or kidney disease and have symptoms of edema. ?You have sudden and unexplained weight gain. ?Get help right away if: ?You have shortness of breath or chest pain. ?You cannot breathe when you lie down. ?You have pain, redness, or  warmth in the swollen areas. ?You have heart, liver, or kidney disease and get edema all of a sudden. ?You have a fever and your symptoms get worse all of a sudden. ?These symptoms may be an emergency. Get help right away. Call 911. ?Do not wait to see if the symptoms will go away. ?Do not drive yourself to the hospital. ?Summary ?Edema is when you have too much fluid in your body or under your skin. ?Edema may make your legs, feet, and ankles swell. Swelling often happens in looser tissues, such as around your eyes. ?Raise the injured area above the level of your heart while you are sitting or lying down. ?Follow your doctor's instructions about diet and how much fluid you can drink. ?This information is not intended to replace advice given to you by your health care provider. Make sure you discuss any questions you have with your health care provider. ?Document Revised: 04/19/2021 Document Reviewed: 04/19/2021 ?Elsevier Patient Education ? 2023 [employer] ? ?

## 2022-02-10 NOTE — Progress Notes (Signed)
Established Patient Office Visit  Subjective   Patient ID: Renee Bush, female    DOB: [birth year]  Age: 61 y.o. MRN: [id number]  Chief Complaint  Patient presents with   Foot Swelling   Leg Swelling    Pt feet and legs are swollen, and experienced pain couple weeks ago and could hardly walk. Currently pain is once in a while (random).    HPI  Renee Bush is a 61 year old female with history of T2DM, HTN, HLD , Sarcoidosis, presents for follow up after ED visit. She was seen at the ED on 02/01/22 for leg swelling, chest x ray was normal,bilateral lower extremity venous doppler ultrasound was negative for DVT, was advised to wear compression stockings. Currently, she states that the swelling to her legs has resolved 50% with wearing compression stockings and elevating her legs while sitting down. She denies chest pain, shortness of breath, and claudication. Overall, she states that she's doing well and offers no further complaint.  Review of Systems  Constitutional: Negative.   Cardiovascular:  Positive for leg swelling (+2 edema BLE).  Skin: Negative.   Neurological: Negative.       Objective:     BP 108/70 (BP Location: Left Arm, Patient Position: Sitting, Cuff Size: Small)   Pulse 86   Temp 98 F (36.7 C) (Oral)   Resp 16   Ht 5\' 2"  (1.575 m)   Wt 184 lb (83.5 kg)   SpO2 94%   BMI 33.65 kg/m  BP Readings from Last 3 Encounters:  02/10/22 108/70  02/01/22 127/72  01/19/22 128/68   Wt Readings from Last 3 Encounters:  02/10/22 184 lb (83.5 kg)  02/01/22 180 lb (81.6 kg)  01/19/22 186 lb (84.4 kg)      Physical Exam HENT:     Head: Normocephalic and atraumatic.     Mouth/Throat:     Mouth: Mucous membranes are moist.  Eyes:     Extraocular Movements: Extraocular movements intact.     Conjunctiva/sclera: Conjunctivae normal.     Pupils: Pupils are equal, round, and reactive to light.  Cardiovascular:     Rate and Rhythm: Normal rate and regular  rhythm.     Pulses: Normal pulses.     Heart sounds: Normal heart sounds.  Pulmonary:     Effort: Pulmonary effort is normal.     Breath sounds: Normal breath sounds.  Musculoskeletal:     Left lower leg: Edema (+2 edema to BLE) present.  Skin:    General: Skin is warm.  Neurological:     General: No focal deficit present.     Mental Status: She is alert and oriented to person, place, and time. Mental status is at baseline.  Psychiatric:        Mood and Affect: Mood normal.        Behavior: Behavior normal.        Thought Content: Thought content normal.        Judgment: Judgment normal.      No results found for any visits on 02/10/22.  Last CBC Lab Results  Component Value Date   WBC 8.6 02/01/2022   HGB 13.1 02/01/2022   HCT 39.9 02/01/2022   MCV 88.9 02/01/2022   MCH 29.2 02/01/2022   RDW 12.1 02/01/2022   PLT 235 02/01/2022   Last metabolic panel Lab Results  Component Value Date   GLUCOSE 227 (H) 02/01/2022   NA 137 02/01/2022   K 3.9 02/01/2022  CL 100 02/01/2022   CO2 28 02/01/2022   BUN 15 02/01/2022   CREATININE 1.07 (H) 02/01/2022   GFRNONAA 59 (L) 02/01/2022   CALCIUM 9.2 02/01/2022   PROT 6.9 02/01/2022   ALBUMIN 3.1 (L) 02/01/2022   LABGLOB 1.8 10/06/2021   AGRATIO 2.3 (H) 10/06/2021   BILITOT 0.5 02/01/2022   ALKPHOS 58 02/01/2022   AST 16 02/01/2022   ALT 10 02/01/2022   ANIONGAP 9 02/01/2022   Last lipids Lab Results  Component Value Date   CHOL 163 10/06/2021   HDL 40 10/06/2021   LDLCALC 96 10/06/2021   TRIG 152 (H) 10/06/2021   CHOLHDL 4.1 10/06/2021   Last hemoglobin A1c Lab Results  Component Value Date   HGBA1C 9.3 (A) 01/06/2022      The 10-year ASCVD risk score (Arnett DK, et al., 2019) is: 7%    Assessment & Plan:     1. Bilateral edema of lower extremity - Per ED visit note, possible venous insufficiency, she was encouraged to continue wearing compression stockings  first thing in the morning and remove  before bedtime, elevate legs while sitting down. She was advised to go to the ED for worsening symptoms.    Return in about 4 weeks (around 03/10/2022), or if symptoms worsen or fail to improve.    Britzy Graul Trellis Paganini, NP

## 2022-02-14 ENCOUNTER — Telehealth: Payer: Self-pay | Admitting: Internal Medicine

## 2022-02-14 NOTE — Telephone Encounter (Signed)
Pt states that her doctor recommends that she see vein specialist and she wants to check in with Dr. Okey Dupre if that's okay.

## 2022-02-14 NOTE — Telephone Encounter (Signed)
Called and spoke with pt. Pt reports that about 2 months ago she began to have swelling in both feet and some in both legs below knee. Also notes "sharp" pain in both feet that radiate up both legs. Reports pain occurring at rest. Pt states that she has had swelling in feet before but states "this feels different". Pt unable to get shoes on "sometimes, but right now I am wearing slippers." Pt also reports some swelling in her right hand as well. Denies redness to skin.   Pt denies incr in sodium and reports she has been keeping legs elevated when she is able.   Pt states that she saw her PCP last Thursday and states PCP wanted her to have evaluation with vein specialist but did not place referral.    Advised pt I have scheduled an appointment in our office earliest available with Cadence Furth, PA-C.  Advised if any worsening symptoms to go to the emergency room.  Pt voiced understanding and appreciative of appointment.

## 2022-02-15 ENCOUNTER — Other Ambulatory Visit: Payer: Self-pay

## 2022-02-17 ENCOUNTER — Emergency Department: Payer: Medicaid Other

## 2022-02-17 ENCOUNTER — Other Ambulatory Visit: Payer: Self-pay

## 2022-02-17 ENCOUNTER — Emergency Department
Admission: EM | Admit: 2022-02-17 | Discharge: 2022-02-17 | Disposition: A | Discharge status: Home / Self Care | Payer: Medicaid Other | Attending: Emergency Medicine | Admitting: Emergency Medicine

## 2022-02-17 DIAGNOSIS — I129 Hypertensive chronic kidney disease with stage 1 through stage 4 chronic kidney disease, or unspecified chronic kidney disease: Secondary | ICD-10-CM | POA: Insufficient documentation

## 2022-02-17 DIAGNOSIS — N183 Chronic kidney disease, stage 3 unspecified: Secondary | ICD-10-CM | POA: Insufficient documentation

## 2022-02-17 DIAGNOSIS — R739 Hyperglycemia, unspecified: Secondary | ICD-10-CM

## 2022-02-17 DIAGNOSIS — E1165 Type 2 diabetes mellitus with hyperglycemia: Secondary | ICD-10-CM | POA: Insufficient documentation

## 2022-02-17 DIAGNOSIS — W19XXXA Unspecified fall, initial encounter: Secondary | ICD-10-CM

## 2022-02-17 DIAGNOSIS — W010XXA Fall on same level from slipping, tripping and stumbling without subsequent striking against object, initial encounter: Secondary | ICD-10-CM | POA: Insufficient documentation

## 2022-02-17 DIAGNOSIS — E1122 Type 2 diabetes mellitus with diabetic chronic kidney disease: Secondary | ICD-10-CM | POA: Diagnosis not present

## 2022-02-17 DIAGNOSIS — M25561 Pain in right knee: Secondary | ICD-10-CM | POA: Diagnosis present

## 2022-02-17 DIAGNOSIS — S0990XA Unspecified injury of head, initial encounter: Secondary | ICD-10-CM | POA: Diagnosis not present

## 2022-02-17 LAB — BASIC METABOLIC PANEL
Anion gap: 10 (ref 5–15)
BUN: 12 mg/dL (ref 8–23)
CO2: 27 mmol/L (ref 22–32)
Calcium: 9.9 mg/dL (ref 8.9–10.3)
Chloride: 96 mmol/L — ABNORMAL LOW (ref 98–111)
Creatinine, Ser: 1.01 mg/dL — ABNORMAL HIGH (ref 0.44–1.00)
GFR, Estimated: >60 mL/min (ref >60)
Glucose, Bld: 322 mg/dL — ABNORMAL HIGH (ref 70–99)
Potassium: 4.2 mmol/L (ref 3.5–5.1)
Sodium: 133 mmol/L — ABNORMAL LOW (ref 135–145)

## 2022-02-17 LAB — CBC
HCT: 35.7 % — ABNORMAL LOW (ref 36.0–46.0)
Hemoglobin: 11.4 g/dL — ABNORMAL LOW (ref 12.0–15.0)
MCH: 28.6 pg (ref 26.0–34.0)
MCHC: 31.9 g/dL (ref 30.0–36.0)
MCV: 89.5 fL (ref 80.0–100.0)
Platelets: 236 10*3/uL (ref 150–400)
RBC: 3.99 MIL/uL (ref 3.87–5.11)
RDW: 12.1 % (ref 11.5–15.5)
WBC: 8.2 10*3/uL (ref 4.0–10.5)
nRBC: 0 % (ref 0.0–0.2)

## 2022-02-17 MED ORDER — OXYCODONE HCL 5 MG PO TABS
5.0000 mg | ORAL_TABLET | Freq: Once | ORAL | Status: AC
  Administered 2022-02-17: 5 mg via ORAL
  Filled 2022-02-17: qty 1

## 2022-02-17 MED ORDER — LIDOCAINE 5 % EX PTCH
1.0000 | MEDICATED_PATCH | Freq: Two times a day (BID) | CUTANEOUS | 0 refills | Status: AC
Start: 2022-02-17 — End: 2022-02-22

## 2022-02-17 MED ORDER — ACETAMINOPHEN 325 MG PO TABS
650.0000 mg | ORAL_TABLET | Freq: Once | ORAL | Status: DC
  Filled 2022-02-17: qty 2

## 2022-02-17 NOTE — ED Triage Notes (Signed)
Pt comes pov from home. Pt got up at 3am and went to the bathroom and when she came back she fell on the floor. Isn't sure if she got dizzy, slipped, or lost consciouness. C/o left upper leg and right knee pain. Is not on any blood thinners.

## 2022-02-17 NOTE — ED Provider Notes (Signed)
[facility] Provider Note    Event Date/Time   First MD Initiated Contact with Patient 02/17/22 1029     (approximate)   History   Fall   HPI  CALEN NALLY is a 61 y.o. female with a past medical history of sarcoidosis, SVT, balance problem, CKD, hyperlipidemia, type 2 diabetes, alcohol abuse who presents today for evaluation after a fall.  Patient reports that she got up in the middle the night to use the bathroom and thinks that she tripped on something and landed on her right knee.  Patient reports that she hit her head on the ground but did not lose consciousness.  She denies any preceding symptoms such as chest pain, palpitations, weakness etc.  She reports that her husband heard her fall and got up to help her.  Patient was able to get back to the bed.  She continued to have pain in her knee this morning prompting her to come in for evaluation.  No headache or vomiting.  No vision changes.  No chest pain or belly pain.  Patient reports that she has pain in her right knee only.  Patient Active Problem List   Diagnosis Date Noted   Hospital discharge follow-up 01/06/2022   Blood in stool 12/28/2021   Persistent dry cough 11/16/2021   Urinary tract infection 11/16/2021   Sarcoidosis 11/12/2021   Palpitations 10/28/2021   SVT (supraventricular tachycardia) (HCC) 10/28/2021   Shortness of breath 10/28/2021   Chest pain 10/14/2021   Constipation 07/13/2021   Urinary frequency 07/02/2020   Antibiotic-induced yeast infection 01/09/2020   Urinary tract infection symptoms 12/18/2019   Balance problem 12/18/2019   Abnormal laboratory test result 09/04/2019   Acute sinusitis 08/15/2019   History of allergy 06/11/2019   Family history of colonic polyps    Bilateral edema of lower extremity 05/16/2019   Generalized anxiety disorder 02/07/2019   Anemia 07/05/2018   Abdominal pain 11/09/2017   Nausea 11/09/2017   Insomnia 10/06/2017   GERD  (gastroesophageal reflux disease) 10/06/2017   Chronic kidney disease, stage III (moderate) (HCC) 10/06/2017   B12 deficiency 06/09/2016   Hyperlipidemia associated with type 2 diabetes mellitus (HCC) 05/05/2016   Type 2 diabetes mellitus with diabetic neuropathy, with long-term current use of insulin (HCC) 09/10/2015   Essential hypertension 09/10/2015   ETOH abuse 04/18/2015   Fatigue 04/18/2015          Physical Exam   Triage Vital Signs: ED Triage Vitals  Enc Vitals Group     BP 02/17/22 0926 123/77     Pulse Rate 02/17/22 0926 83     Resp 02/17/22 0926 16     Temp 02/17/22 0926 98 F (36.7 C)     Temp Source 02/17/22 0926 Oral     SpO2 02/17/22 0926 96 %     Weight 02/17/22 0927 180 lb (81.6 kg)     Height 02/17/22 0927 5\' 2"  (1.575 m)     Head Circumference --      Peak Flow --      Pain Score 02/17/22 0927 10     Pain Loc --      Pain Edu? --      Excl. in GC? --     Most recent vital signs: Vitals:   02/17/22 0926  BP: 123/77  Pulse: 83  Resp: 16  Temp: 98 F (36.7 C)  SpO2: 96%    Physical Exam Vitals and nursing note reviewed.  Constitutional:  General: Awake and alert. No acute distress.    Appearance: Normal appearance. The patient is overweight.  HENT:     Head: Normocephalic and atraumatic.     Mouth: Mucous membranes are moist.  Eyes:     General: PERRL. Normal EOMs        Right eye: No discharge.        Left eye: No discharge.     Conjunctiva/sclera: Conjunctivae normal.  Cardiovascular:     Rate and Rhythm: Normal rate and regular rhythm.     Pulses: Normal pulses.     Heart sounds: Normal heart sounds Pulmonary:     Effort: Pulmonary effort is normal. No respiratory distress.     Breath sounds: Normal breath sounds.  Abdominal:     Abdomen is soft. There is no abdominal tenderness. No rebound or guarding. No distention. Musculoskeletal:        General: No swelling. Normal range of motion.     Cervical back: Normal range of  motion and neck supple.  Skin:    General: Skin is warm and dry.     Capillary Refill: Capillary refill takes less than 2 seconds.     Findings: No rash.  Neurological:     Mental Status: The patient is awake and alert.      ED Results / Procedures / Treatments   Labs (all labs ordered are listed, but only abnormal results are displayed) Labs Reviewed  BASIC METABOLIC PANEL - Abnormal; Notable for the following components:      Result Value   Sodium 133 (*)    Chloride 96 (*)    Glucose, Bld 322 (*)    Creatinine, Ser 1.01 (*)    All other components within normal limits  CBC - Abnormal; Notable for the following components:   Hemoglobin 11.4 (*)    HCT 35.7 (*)    All other components within normal limits  URINALYSIS, ROUTINE W REFLEX MICROSCOPIC  CBG MONITORING, ED     EKG     RADIOLOGY     PROCEDURES:  Critical Care performed:   Procedures   MEDICATIONS ORDERED IN ED: Medications  acetaminophen (TYLENOL) tablet 650 mg (650 mg Oral Not Given 02/17/22 1217)  oxyCODONE (Oxy IR/ROXICODONE) immediate release tablet 5 mg (5 mg Oral Given 02/17/22 1205)     IMPRESSION / MDM / ASSESSMENT AND PLAN / ED COURSE  I reviewed the triage vital signs and the nursing notes.   Differential diagnosis includes, but is not limited to, fracture, ligamental injury, contusion.  Patient is awake and alert, hemodynamically stable and afebrile.  There is no clear history of preceding symptoms such as palpitations, weakness, syncope.  However, labs and EKG obtained in triage are overall reassuring.  Patient is hyperglycemic, though normal anion gap, normal bicarb, do not suspect DKA.  She reports that she has not taken her insulin today but plans to do so when she gets home.  CT head and neck and knee x-ray are normal.  Patient has tenderness along the medial aspect of her knee, no obvious ligamental laxity, though possible MCL strain or meniscus injury.  She was placed in a knee  splint and instructed to follow-up with orthopedics.  She was treated symptomatically with improvement of her symptoms.  She has normal pedal pulses.  Normal strength and sensation to bilateral lower extremities, do not suspect vascular injury.  We discussed return precautions and the importance of close outpatient follow-up.  Patient understands and agrees with plan.  Patient's presentation is most consistent with acute presentation with potential threat to life or bodily function.      FINAL CLINICAL IMPRESSION(S) / ED DIAGNOSES   Final diagnoses:  Fall, initial encounter  Acute pain of right knee  Hyperglycemia     Rx / DC Orders   ED Discharge Orders          Ordered    lidocaine (LIDODERM) 5 %  Every 12 hours        02/17/22 1225             Note:  This document was prepared using Dragon voice recognition software and may include unintentional dictation errors.   Keturah Shavers 02/17/22 1524    Shaune Pollack, MD 02/17/22 2008

## 2022-02-17 NOTE — ED Notes (Signed)
See triage note  presents s/p fall  states she is not sure what made her fall   she was coming back from the bathroom  having pain to both knees but increased pain to right knee   min swelling noted

## 2022-02-17 NOTE — Discharge Instructions (Signed)
You may follow-up with orthopedics if your knee pain persists.  Wear your splint while ambulating, please remove at night.  Your blood sugar was also elevated, please be sure that you are taking your insulin as prescribed.  Please return for any new, worsening, or change in symptoms or other concerns.  It was a pleasure caring for you today.

## 2022-02-22 ENCOUNTER — Ambulatory Visit (INDEPENDENT_AMBULATORY_CARE_PROVIDER_SITE_OTHER): Payer: Medicaid Other | Admitting: Medical

## 2022-02-22 ENCOUNTER — Ambulatory Visit: Payer: Medicaid Other | Admitting: Licensed Clinical Social Worker

## 2022-02-22 ENCOUNTER — Other Ambulatory Visit: Payer: Self-pay

## 2022-02-22 ENCOUNTER — Encounter: Payer: Self-pay | Admitting: Medical

## 2022-02-22 VITALS — BP 104/60 | HR 98 | Ht 62.0 in | Wt 181.0 lb

## 2022-02-22 DIAGNOSIS — E782 Mixed hyperlipidemia: Secondary | ICD-10-CM

## 2022-02-22 DIAGNOSIS — I10 Essential (primary) hypertension: Secondary | ICD-10-CM | POA: Diagnosis not present

## 2022-02-22 DIAGNOSIS — Z79899 Other long term (current) drug therapy: Secondary | ICD-10-CM

## 2022-02-22 DIAGNOSIS — R600 Localized edema: Secondary | ICD-10-CM | POA: Diagnosis not present

## 2022-02-22 DIAGNOSIS — M79605 Pain in left leg: Secondary | ICD-10-CM

## 2022-02-22 DIAGNOSIS — M79604 Pain in right leg: Secondary | ICD-10-CM | POA: Diagnosis not present

## 2022-02-22 DIAGNOSIS — I2510 Atherosclerotic heart disease of native coronary artery without angina pectoris: Secondary | ICD-10-CM

## 2022-02-22 DIAGNOSIS — I471 Supraventricular tachycardia: Secondary | ICD-10-CM

## 2022-02-22 MED ORDER — HYDROCHLOROTHIAZIDE 12.5 MG PO CAPS
12.5000 mg | ORAL_CAPSULE | Freq: Every day | ORAL | 0 refills | Status: DC
  Filled 2022-02-22: qty 30, 30d supply, fill #0

## 2022-02-22 MED ORDER — METOPROLOL TARTRATE 25 MG PO TABS: 50.0000 mg | ORAL_TABLET | Freq: Two times a day (BID) | ORAL | 0 refills | Status: DC

## 2022-02-22 MED ORDER — METOPROLOL TARTRATE 25 MG PO TABS: 50.0000 mg | ORAL_TABLET | Freq: Two times a day (BID) | ORAL | Status: DC

## 2022-02-22 MED ORDER — HYDROCHLOROTHIAZIDE 12.5 MG PO CAPS: 12.5000 mg | ORAL_CAPSULE | Freq: Every day | ORAL | 0 refills | Status: DC

## 2022-02-24 ENCOUNTER — Other Ambulatory Visit: Payer: Self-pay

## 2022-02-24 ENCOUNTER — Telehealth: Payer: Self-pay

## 2022-02-24 ENCOUNTER — Other Ambulatory Visit: Payer: Self-pay | Admitting: Emergency Medicine

## 2022-02-24 DIAGNOSIS — E1140 Type 2 diabetes mellitus with diabetic neuropathy, unspecified: Secondary | ICD-10-CM

## 2022-02-24 MED ORDER — GABAPENTIN 400 MG PO CAPS
ORAL_CAPSULE | ORAL | 0 refills | Status: DC
  Filled 2022-02-24: qty 120, 30d supply, fill #0

## 2022-02-25 ENCOUNTER — Other Ambulatory Visit: Payer: Self-pay

## 2022-03-02 ENCOUNTER — Other Ambulatory Visit: Payer: Self-pay

## 2022-03-03 ENCOUNTER — Other Ambulatory Visit (HOSPITAL_COMMUNITY): Payer: Self-pay

## 2022-03-15 ENCOUNTER — Other Ambulatory Visit: Payer: Self-pay

## 2022-03-15 ENCOUNTER — Encounter: Payer: Self-pay | Admitting: Gerontology

## 2022-03-15 ENCOUNTER — Ambulatory Visit: Payer: Medicaid Other | Admitting: Gerontology

## 2022-03-15 VITALS — BP 128/85 | HR 97 | Temp 98.2°F | Resp 16 | Wt 178.8 lb

## 2022-03-15 DIAGNOSIS — F411 Generalized anxiety disorder: Secondary | ICD-10-CM

## 2022-03-15 DIAGNOSIS — F3341 Major depressive disorder, recurrent, in partial remission: Secondary | ICD-10-CM

## 2022-03-15 DIAGNOSIS — E1140 Type 2 diabetes mellitus with diabetic neuropathy, unspecified: Secondary | ICD-10-CM

## 2022-03-15 DIAGNOSIS — K219 Gastro-esophageal reflux disease without esophagitis: Secondary | ICD-10-CM

## 2022-03-15 DIAGNOSIS — M7989 Other specified soft tissue disorders: Secondary | ICD-10-CM

## 2022-03-15 LAB — GLUCOSE, POCT (MANUAL RESULT ENTRY): POC Glucose: 166 mg/dl — AB (ref 70–99)

## 2022-03-15 MED ORDER — DULOXETINE HCL 30 MG PO CPEP
ORAL_CAPSULE | Freq: Two times a day (BID) | ORAL | 0 refills | Status: DC
  Filled 2022-03-15: qty 120, 30d supply, fill #0

## 2022-03-15 MED ORDER — TRAZODONE HCL 150 MG PO TABS
150.0000 mg | ORAL_TABLET | Freq: Every day | ORAL | 0 refills | Status: DC
  Filled 2022-03-15: qty 30, 30d supply, fill #0

## 2022-03-15 MED ORDER — METFORMIN HCL 500 MG PO TABS
ORAL_TABLET | Freq: Two times a day (BID) | ORAL | 0 refills | Status: DC
  Filled 2022-03-15: qty 180, 90d supply, fill #0

## 2022-03-15 MED ORDER — HYDROXYZINE HCL 25 MG PO TABS
25.0000 mg | ORAL_TABLET | Freq: Two times a day (BID) | ORAL | 2 refills | Status: DC | PRN
  Filled 2022-03-15: qty 60, 30d supply, fill #0

## 2022-03-15 MED ORDER — OMEPRAZOLE 20 MG PO CPDR
DELAYED_RELEASE_CAPSULE | ORAL | 3 refills | Status: DC
  Filled 2022-03-15: qty 60, 30d supply, fill #0

## 2022-03-15 NOTE — Patient Instructions (Signed)
Edema ? ?Edema is when you have too much fluid in your body or under your skin. Edema may make your legs, feet, and ankles swell. Swelling often happens in looser tissues, such as around your eyes. This is a common condition. It gets more common as you get older. ?There are many possible causes of edema. These include: ?Eating too much salt (sodium). ?Being on your feet or sitting for a long time. ?Certain medical conditions, such as: ?Pregnancy. ?Heart failure. ?Liver disease. ?Kidney disease. ?Cancer. ?Hot weather may make edema worse. Edema is usually painless. Your skin may look swollen or shiny. ?Follow these instructions at home: ?Medicines ?Take over-the-counter and prescription medicines only as told by your doctor. ?Your doctor may prescribe a medicine to help your body get rid of extra water (diuretic). Take this medicine if you are told to take it. ?Eating and drinking ?Eat a low-salt (low-sodium) diet as told by your doctor. Sometimes, eating less salt may reduce swelling. ?Depending on the cause of your swelling, you may need to limit how much fluid you drink (fluid restriction). ?General instructions ?Raise the injured area above the level of your heart while you are sitting or lying down. ?Do not sit still or stand for a long time. ?Do not wear tight clothes. Do not wear garters on your upper legs. ?Exercise your legs. This can help the swelling go down. ?Wear compression stockings as told by your doctor. It is important that these are the right size. These should be prescribed by your doctor to prevent possible injuries. ?If elastic bandages or wraps are recommended, use them as told by your doctor. ?Contact a doctor if: ?Treatment is not working. ?You have heart, liver, or kidney disease and have symptoms of edema. ?You have sudden and unexplained weight gain. ?Get help right away if: ?You have shortness of breath or chest pain. ?You cannot breathe when you lie down. ?You have pain, redness, or  warmth in the swollen areas. ?You have heart, liver, or kidney disease and get edema all of a sudden. ?You have a fever and your symptoms get worse all of a sudden. ?These symptoms may be an emergency. Get help right away. Call 911. ?Do not wait to see if the symptoms will go away. ?Do not drive yourself to the hospital. ?Summary ?Edema is when you have too much fluid in your body or under your skin. ?Edema may make your legs, feet, and ankles swell. Swelling often happens in looser tissues, such as around your eyes. ?Raise the injured area above the level of your heart while you are sitting or lying down. ?Follow your doctor's instructions about diet and how much fluid you can drink. ?This information is not intended to replace advice given to you by your health care provider. Make sure you discuss any questions you have with your health care provider. ?Document Revised: 04/19/2021 Document Reviewed: 04/19/2021 ?Elsevier Patient Education ? 2023 [employer] ? ?

## 2022-03-15 NOTE — Progress Notes (Signed)
Established Patient Office Visit  Subjective   Patient ID: Renee Bush, female    DOB: November 18, 1960  Age: 61 y.o. MRN: [id number]  No chief complaint on file.   HPI  Renee Bush is a 61 year old female with history of T2DM, HTN, HLD , Sarcoidosis, presents for routine follow-up visit.  She states that she is compliant with her medications, denies side effects and continues to make healthy lifestyle changes.  Her hemoglobin A1c done on 01/06/2022 was 9.3% and her fasting blood glucose this morning was 166 mg per DL.  She denies hypo/hyper glycemic symptoms, gabapentin controls her peripheral neuropathy and she performs daily foot checks.  She has +1 edema to her lower extremities, states that she wears her compression stockings due to she was not wearing one during visit, but she denies claudication or discoloration. She was seen at the Cardiology clinic on 02/22/22, by Pauletta Browns PA-C, was started on hctz 12.5 mg daily and to recheck BMP in 2 weeks. She had a fall and was seen at the ED on 02/17/22 for right knee pain. Imaging showed Small effusion. Ligamentous injury is not excluded. No acute osseous abnormality and followed up at [facility] on 03/04/22 and was referred to Physical Therapy. She has active Medicaid and today will be her last visit. Overall, she states that she's doing well and offers no further complaint.  Review of Systems  Constitutional: Negative.   Eyes: Negative.   Respiratory: Negative.    Cardiovascular:  Positive for leg swelling.  Skin: Negative.   Neurological: Negative.   Endo/Heme/Allergies: Negative.   Psychiatric/Behavioral: Negative.        Objective:     BP 128/85 (BP Location: Right Arm, Patient Position: Sitting, Cuff Size: Large)   Pulse 97   Temp 98.2 F (36.8 C)   Resp 16   Wt 178 lb 12.8 oz (81.1 kg)   SpO2 94%   BMI 32.70 kg/m  BP Readings from Last 3 Encounters:  03/15/22 128/85  02/22/22 104/60  02/17/22 123/77   Wt  Readings from Last 3 Encounters:  03/15/22 178 lb 12.8 oz (81.1 kg)  02/22/22 181 lb (82.1 kg)  02/17/22 180 lb (81.6 kg)      Physical Exam HENT:     Head: Normocephalic and atraumatic.     Mouth/Throat:     Mouth: Mucous membranes are moist.  Eyes:     Extraocular Movements: Extraocular movements intact.     Conjunctiva/sclera: Conjunctivae normal.     Pupils: Pupils are equal, round, and reactive to light.  Cardiovascular:     Rate and Rhythm: Normal rate and regular rhythm.     Pulses: Normal pulses.     Heart sounds: Normal heart sounds.  Pulmonary:     Effort: Pulmonary effort is normal.     Breath sounds: Normal breath sounds.  Musculoskeletal:        General: Swelling (+1 edema to bilateral ower leg.) present.  Skin:    General: Skin is warm.  Neurological:     General: No focal deficit present.     Mental Status: She is alert and oriented to person, place, and time. Mental status is at baseline.  Psychiatric:        Mood and Affect: Mood normal.        Behavior: Behavior normal.        Thought Content: Thought content normal.        Judgment: Judgment normal.  Results for orders placed or performed in visit on 03/15/22  POCT Glucose (CBG)  Result Value Ref Range   POC Glucose 166 (A) 70 - 99 mg/dl    Last CBC Lab Results  Component Value Date   WBC 8.2 02/17/2022   HGB 11.4 (L) 02/17/2022   HCT 35.7 (L) 02/17/2022   MCV 89.5 02/17/2022   MCH 28.6 02/17/2022   RDW 12.1 02/17/2022   PLT 236 02/17/2022   Last metabolic panel Lab Results  Component Value Date   GLUCOSE 322 (H) 02/17/2022   NA 133 (L) 02/17/2022   K 4.2 02/17/2022   CL 96 (L) 02/17/2022   CO2 27 02/17/2022   BUN 12 02/17/2022   CREATININE 1.01 (H) 02/17/2022   GFRNONAA >60 02/17/2022   CALCIUM 9.9 02/17/2022   PROT 6.9 02/01/2022   ALBUMIN 3.1 (L) 02/01/2022   LABGLOB 1.8 10/06/2021   AGRATIO 2.3 (H) 10/06/2021   BILITOT 0.5 02/01/2022   ALKPHOS 58 02/01/2022   AST 16  02/01/2022   ALT 10 02/01/2022   ANIONGAP 10 02/17/2022   Last lipids Lab Results  Component Value Date   CHOL 163 10/06/2021   HDL 40 10/06/2021   LDLCALC 96 10/06/2021   TRIG 152 (H) 10/06/2021   CHOLHDL 4.1 10/06/2021   Last hemoglobin A1c Lab Results  Component Value Date   HGBA1C 9.3 (A) 01/06/2022      The 10-year ASCVD risk score (Arnett DK, et al., 2019) is: 9.7%    Assessment & Plan:   1. Type 2 diabetes mellitus with diabetic neuropathy, with long-term current use of insulin (HCC) -Her HgbA1c was 9.3%, she will continue current medication, low carb/non concentrated sweet diet and exercise as tolerated. - POCT Glucose (CBG); Future - metFORMIN (GLUCOPHAGE) 500 MG tablet; TAKE ONE TABLET BY MOUTH 2 TIMES A DAY  Dispense: 180 tablet; Refill: 0 - POCT Glucose (CBG)  2. Leg swelling -  She was advised to continue Hctz 12.5 mg daily. She was advised to wear compression stockings when she wakes up in the morning and remove before bedtime, elevate legs while sitting and do go to the ED for worsening symptoms.  3. Generalized anxiety disorder -She will continue current medications,and call the crisis help line with worsening symptoms. - hydrOXYzine (ATARAX) 25 MG tablet; Take 1 tablet (25 mg total) by mouth 2 (two) times daily as needed.  Dispense: 60 tablet; Refill: 2  4. Gastroesophageal reflux disease without esophagitis - Her acid reflux is under control, will continue current medication, -Avoid spicy, fatty and fried food -Avoid sodas and sour juices -Avoid heavy meals -Avoid eating 4 hours before bedtime -Elevate head of bed at night - omeprazole (PRILOSEC) 20 MG capsule; TAKE ONE CAPSULE BY MOUTH 2 TIMES A DAY BEFORE MEALS  Dispense: 60 capsule; Refill: 3  5. Recurrent major depressive disorder, in partial remission (HCC) - She will continue current medication, and call the crisis help line with worsening symptoms. - traZODone (DESYREL) 150 MG tablet; Take 1  tablet (150 mg total) by mouth once nightly at bedtime.  Dispense: 30 tablet; Refill: 0 - DULoxetine (CYMBALTA) 30 MG capsule; Take 2 capsules (60 mg total) by mouth 2 (two) times daily.  Dispense: 120 capsule; Refill: 0   No follow up appointment, she has active Medicaid. [facility] wishes her well with her care.    Ramya Vanbergen Trellis Paganini, NP

## 2022-03-21 ENCOUNTER — Other Ambulatory Visit: Payer: Self-pay | Admitting: Pharmacy Technician

## 2022-03-21 ENCOUNTER — Other Ambulatory Visit: Payer: Self-pay

## 2022-03-21 NOTE — Patient Outreach (Signed)
Patient stated that she has Medicaid with prescription drug coverage.  Made patient aware that she no longer qualifies for free medication assistance at [facility].  Made patient aware that she could continue to use [facility] to obtain medications.  However, she would need to pay the copays that Medicaid requires for her medications.  Patient verbally acknowledged that she understood.  Renee Bush Patient Advocate Specialist [facility] : [facility]

## 2022-03-22 ENCOUNTER — Other Ambulatory Visit: Payer: Self-pay

## 2022-03-22 LAB — VITAMIN D 25 HYDROXY (VIT D DEFICIENCY, FRACTURES): Vit D, 25-Hydroxy: 34.8

## 2022-03-22 LAB — HEMOGLOBIN A1C: Hemoglobin A1C: 8.6

## 2022-03-22 LAB — BASIC METABOLIC PANEL
BUN: 17 (ref 4–21)
CO2: 25 — AB (ref 13–22)
Chloride: 94 — AB (ref 99–108)
Creatinine: 1.3 — AB (ref 0.5–1.1)
Glucose: 155
Potassium: 4.1 mEq/L (ref 3.5–5.1)
Sodium: 136 — AB (ref 137–147)

## 2022-03-22 LAB — CBC AND DIFFERENTIAL
HCT: 38 (ref 36–46)
Hemoglobin: 12.6 (ref 12.0–16.0)
Neutrophils Absolute: 3.9
Platelets: 231 10*3/uL (ref 150–400)
WBC: 6.4

## 2022-03-22 LAB — LIPID PANEL
Cholesterol: 218 — AB (ref 0–200)
HDL: 44 (ref 35–70)
LDL Cholesterol: 148
Triglycerides: 145 (ref 40–160)

## 2022-03-22 LAB — COMPREHENSIVE METABOLIC PANEL
Albumin: 3.9 (ref 3.5–5.0)
Calcium: 12 — AB (ref 8.7–10.7)
Globulin: 2.5
eGFR: 49

## 2022-03-22 LAB — TSH: TSH: 1.01 (ref 0.41–5.90)

## 2022-03-22 LAB — HEPATIC FUNCTION PANEL
ALT: 29 U/L (ref 7–35)
AST: 25 (ref 13–35)
Alkaline Phosphatase: 77 (ref 25–125)
Bilirubin, Total: 0.3

## 2022-03-22 LAB — CBC: RBC: 4.4 (ref 3.87–5.11)

## 2022-03-22 LAB — VITAMIN B12: Vitamin B-12: 812

## 2022-03-22 MED ORDER — ROSUVASTATIN CALCIUM 5 MG PO TABS
5.0000 mg | ORAL_TABLET | Freq: Every day | ORAL | 3 refills | Status: DC
  Filled 2022-03-22: qty 90, 90d supply, fill #0

## 2022-03-22 MED ORDER — METOPROLOL TARTRATE 25 MG PO TABS
50.0000 mg | ORAL_TABLET | Freq: Two times a day (BID) | ORAL | 6 refills | Status: DC
  Filled 2022-03-22: qty 120, 30d supply, fill #0

## 2022-03-22 MED ORDER — CETIRIZINE HCL 10 MG PO TABS
10.0000 mg | ORAL_TABLET | Freq: Every day | ORAL | 3 refills | Status: DC
Start: 2022-03-22 — End: 2023-06-28
  Filled 2022-03-22: qty 90, 90d supply, fill #0

## 2022-03-22 MED ORDER — OMEPRAZOLE 20 MG PO CPDR: 20.0000 mg | DELAYED_RELEASE_CAPSULE | Freq: Two times a day (BID) | ORAL | 3 refills | Status: DC

## 2022-03-22 MED ORDER — FERROUS SULFATE 325 (65 FE) MG PO TABS
325.0000 mg | ORAL_TABLET | Freq: Every morning | ORAL | 3 refills | Status: DC
  Filled 2022-03-22: qty 30, 30d supply, fill #0

## 2022-03-22 MED ORDER — MONTELUKAST SODIUM 10 MG PO TABS
10.0000 mg | ORAL_TABLET | Freq: Every day | ORAL | 3 refills | Status: DC
  Filled 2022-03-22: qty 90, 90d supply, fill #0

## 2022-03-23 ENCOUNTER — Other Ambulatory Visit: Payer: Self-pay

## 2022-03-24 ENCOUNTER — Other Ambulatory Visit: Payer: Self-pay

## 2022-03-29 ENCOUNTER — Ambulatory Visit (INDEPENDENT_AMBULATORY_CARE_PROVIDER_SITE_OTHER): Payer: Medicaid Other

## 2022-03-29 DIAGNOSIS — R600 Localized edema: Secondary | ICD-10-CM | POA: Diagnosis not present

## 2022-03-29 DIAGNOSIS — M79605 Pain in left leg: Secondary | ICD-10-CM | POA: Diagnosis not present

## 2022-03-29 DIAGNOSIS — M79604 Pain in right leg: Secondary | ICD-10-CM

## 2022-03-29 LAB — ECHOCARDIOGRAM LIMITED
Area-P 1/2: 2.77 cm2
Calc EF: 54.5 %
S' Lateral: 2.6 cm
Single Plane A2C EF: 55.5 %
Single Plane A4C EF: 52 %

## 2022-04-05 ENCOUNTER — Ambulatory Visit: Payer: Medicaid Other | Admitting: Medical

## 2022-04-05 ENCOUNTER — Encounter: Payer: Self-pay | Admitting: Medical

## 2022-04-05 ENCOUNTER — Ambulatory Visit: Payer: Medicaid Other | Admitting: Pulmonary Disease

## 2022-04-05 ENCOUNTER — Ambulatory Visit (INDEPENDENT_AMBULATORY_CARE_PROVIDER_SITE_OTHER): Payer: Medicaid Other | Admitting: Cardiology

## 2022-04-05 ENCOUNTER — Other Ambulatory Visit: Payer: Self-pay

## 2022-04-05 VITALS — BP 110/78 | HR 89 | Ht 62.0 in | Wt 170.4 lb

## 2022-04-05 DIAGNOSIS — E782 Mixed hyperlipidemia: Secondary | ICD-10-CM

## 2022-04-05 DIAGNOSIS — I471 Supraventricular tachycardia: Secondary | ICD-10-CM | POA: Diagnosis not present

## 2022-04-05 DIAGNOSIS — D869 Sarcoidosis, unspecified: Secondary | ICD-10-CM | POA: Diagnosis not present

## 2022-04-05 DIAGNOSIS — I2510 Atherosclerotic heart disease of native coronary artery without angina pectoris: Secondary | ICD-10-CM

## 2022-04-05 DIAGNOSIS — R609 Edema, unspecified: Secondary | ICD-10-CM

## 2022-04-05 DIAGNOSIS — I10 Essential (primary) hypertension: Secondary | ICD-10-CM

## 2022-04-05 MED ORDER — HYDROCHLOROTHIAZIDE 12.5 MG PO CAPS
12.5000 mg | ORAL_CAPSULE | Freq: Every day | ORAL | 3 refills | Status: DC
  Filled 2022-04-05: qty 90, 90d supply, fill #0

## 2022-04-05 NOTE — Patient Instructions (Signed)
Medication Instructions:   Your physician recommends that you continue on your current medications as directed. Please refer to the Current Medication list given to you today.   *If you need a refill on your cardiac medications before your next appointment, please call your pharmacy*   Lab Work:  [facility] prior to next appt in 3 months - Please go to the [facility]. You will check in at the front desk to the right as you walk into the atrium. Valet Parking is offered if needed. - No appointment needed. You may go any day between 7 am and 6 pm.   If you have labs (blood work) drawn today and your tests are completely normal, you will receive your results only by: MyChart Message (if you have MyChart) OR A paper copy in the mail If you have any lab test that is abnormal or we need to change your treatment, we will call you to review the results.   Testing/Procedures: None ordered   Follow-Up: At [facility], you and your health needs are our priority.  As part of our continuing mission to provide you with exceptional heart care, we have created designated Provider Care Teams.  These Care Teams include your primary Cardiologist (physician) and Advanced Practice Providers (APPs -  Physician Assistants and Nurse Practitioners) who all work together to provide you with the care you need, when you need it.  We recommend signing up for the patient portal called "MyChart".  Sign up information is provided on this After Visit Summary.  MyChart is used to connect with patients for Virtual Visits (Telemedicine).  Patients are able to view lab/test results, encounter notes, upcoming appointments, etc.  Non-urgent messages can be sent to your provider as well.   To learn more about what you can do with MyChart, go to [email or web address].    Your next appointment:   3 month(s)  The format for your next appointment:   In Person  Provider:   You may see Yvonne Kendall, MD or one of the  following Advanced Practice Providers on your designated Care Team:   Nicolasa Ducking, NP Eula Listen, PA-C Cadence Fransico Michael, [place]    Other Instructions N/A  Important Information About Sugar

## 2022-04-05 NOTE — Progress Notes (Signed)
Cardiology Clinic Note   Patient Name: Renee Bush Date of Encounter: 04/05/2022  Primary Care Provider:  Miki Kins, FNP Primary Cardiologist:  Yvonne Kendall, MD  Patient Profile    61 year old female with a history of PSVT, hypertension, hyperlipidemia, diabetes, gastroesophageal reflux disease, sarcoidosis who presents today for follow-up on recent LLE pain and swelling.  Past Medical History    Past Medical History:  Diagnosis Date   Chronic kidney disease, stage III (moderate) (HCC) 10/06/2017   Diabetes mellitus without complication (HCC)    GERD (gastroesophageal reflux disease)    Hypercholesteremia    Hypertension 09/10/2015   Sarcoidosis 10/12/2021   self-reported   Past Surgical History:  Procedure Laterality Date   APPENDECTOMY     CESAREAN SECTION  1991   COLONOSCOPY WITH PROPOFOL N/A 06/03/2019   Procedure: COLONOSCOPY WITH PROPOFOL;  Surgeon: Wyline Mood, MD;  Location: [facility] ENDOSCOPY;  Service: Gastroenterology;  Laterality: N/A;    Allergies  Allergies  Allergen Reactions   Etodolac Rash   Penicillin G Rash   Penicillins Rash    Has patient had a PCN reaction causing immediate rash, facial/tongue/throat swelling, SOB or lightheadedness with hypotension: Yes Has patient had a PCN reaction causing severe rash involving mucus membranes or skin necrosis: No Has patient had a PCN reaction that required hospitalization: No Has patient had a PCN reaction occurring within the last 10 years: No If all of the above answers are "NO", then may proceed with Cephalosporin use.    History of Present Illness    Renee Bush is a 61 year old female with a past medical history of paroxysmal SVT, hypertension, hyperlipidemia, diabetes, gastroesophageal reflux disease, sarcoidosis, and recent concerns with left lower extremity swelling and pain.  She was seen by Dr. Okey Dupre earlier in the year (10/27/2021) for pSVT and placed on beta-blocker  therapy with metoprolol.  Coronary CTA showed mild coronary artery plaque with calcium score of 276.  Echo revealed LVEF 60 to 65%, no regional wall motion abnormalities, G1 DD, normal ventricular systolic function on the right, mild to moderate TR . On her visit 12/10/2021 she was feeling fairly well.  If she suffered from recurrent arrhythmias she would need a cMRI to exclude myocardial infiltration in the setting of sarcoidosis and had an EP referral.  Last seen 02/22/2022 and reported lower leg edema for the last month and a half.  Swelling was consistent bilaterally, venous Doppler was negative for DVT and ABIs were completed and resulted as normal.  She returns to clinic today feeling well. She denies any chest pain, shortness of breath, palpitations, or bilateral lower extremity swelling or pain. She has tolerated her medications without issues. Denies any hospitalizations or visits to the emergency department.   Home Medications    Current Outpatient Medications  Medication Sig Dispense Refill   albuterol (PROVENTIL HFA) 108 (90 Base) MCG/ACT inhaler Inhale 2 puffs into the lungs every 6 (six) hours as needed for wheezing or shortness of breath. 6.7 g 3   cetirizine (ZYRTEC) 10 MG tablet Take 1 tablet (10 mg total) by mouth daily. 90 tablet 3   cyclobenzaprine (FLEXERIL) 10 MG tablet Take 1 tablet by mouth 2 (two) times daily as needed for muscle spasms.     Dulaglutide 3 MG/0.5ML SOPN INJECT 3MG  INTO THE SKIN ONCE A WEEK AS DIRECTED 16 mL 3   DULoxetine (CYMBALTA) 30 MG capsule Take 2 capsules (60 mg total) by mouth 2 (two) times daily. 120 capsule  0   ferrous sulfate (FEROSUL) 325 (65 FE) MG tablet TAKE ONE TABLET BY MOUTH EVERY DAY WITH BREAKFAST. 90 tablet 1   ferrous sulfate (FEROSUL) 325 (65 FE) MG tablet Take 1 tablet (325 mg total) by mouth every morning. 90 tablet 3   hydrOXYzine (ATARAX) 25 MG tablet Take 1 tablet (25 mg total) by mouth 2 (two) times daily as needed. 60 tablet 2    Insulin Glargine (BASAGLAR KWIKPEN) 100 UNIT/ML INJECT 17 UNITS UNDER THE SKIN ONCE DAILY AT BEDTIME. 15 mL 4   insulin lispro (HUMALOG) 100 UNIT/ML injection Inject 0.02 mLs (2 Units total) into the skin 3 (three) times daily before meals. Sliding scale 121-150 = 2 units + 1 unit = 3 units,  151-200 = 2 units + 2 units = 4 units 201- 250 = 2 units + 3 units = 5 units 251 - 300 = 2 units + 4 units = 6 units 301- 350 = 2 units + 5 units = 7 units 351 - 400 = 2 units + 6 units = 8 units > 400 call clinic. 10 mL 11   Insulin Pen Needle 32G X 6 MM MISC Use as directed. 100 each PRN   Melatonin 10 MG TABS Take 1 tablet by mouth at bedtime.     metFORMIN (GLUCOPHAGE) 500 MG tablet TAKE ONE TABLET BY MOUTH 2 TIMES A DAY 180 tablet 0   metoprolol tartrate (LOPRESSOR) 25 MG tablet Take 2 tablets (50 mg total) by mouth 2 (two) times daily. 120 tablet 0   montelukast (SINGULAIR) 10 MG tablet Take 1 tablet (10 mg total) by mouth daily. 90 tablet 3   nitroGLYCERIN (NITROSTAT) 0.4 MG SL tablet DISSOLVE 1 TABLET UNDER TONGUE AS NEEDED FOR CHEST PAIN EVERY 5 MINUTES FOR MAX OF 3 DOSES IN 15 MINUTES. IF NO RELIEF AFTER FIRST DOSE CALL 911. 25 tablet 0   Omega-3 Fatty Acids (FISH OIL) 1000 MG CAPS Take 1,000 mg by mouth 2 (two) times daily.     omeprazole (PRILOSEC) 20 MG capsule Take 1 capsule (20 mg total) by mouth 2 (two) times daily. 180 capsule 3   rosuvastatin (CRESTOR) 5 MG tablet TAKE ONE TABLET BY MOUTH ONCE DAILY 60 tablet 2   senna-docusate (SENOKOT-S) 8.6-50 MG tablet Take 1 tablet by mouth once daily. 30 tablet 3   traZODone (DESYREL) 150 MG tablet Take 1 tablet (150 mg total) by mouth once nightly at bedtime. 30 tablet 0   gabapentin (NEURONTIN) 400 MG capsule TAKE ONE CAPSULE BY MOUTH 2 TIMES A DAY AND TAKE 2 CAPSULES BY MOUTH AT BEDTIME (Patient not taking: Reported on 04/05/2022) 120 capsule 0   hydrochlorothiazide (MICROZIDE) 12.5 MG capsule Take 1 capsule (12.5 mg total) by mouth daily. 90  capsule 3   No current facility-administered medications for this visit.   Facility-Administered Medications Ordered in Other Visits  Medication Dose Route Frequency Provider Last Rate Last Admin   albuterol (PROVENTIL) (2.5 MG/3ML) 0.083% nebulizer solution 2.5 mg  2.5 mg Nebulization Once Salena Saner, MD         Family History    Family History  Problem Relation Age of Onset   COPD Mother    Alzheimer's disease Mother    Hypertension Father    Hyperlipidemia Father    Heart attack Father    Diabetes type II Father    Lupus Father    Diabetes type II Sister    Diabetes type II Sister    Diabetes type  II Sister    Diabetes type II Sister    Gestational diabetes Daughter    She indicated that her mother is deceased. She indicated that her father is deceased. She indicated that all of her four sisters are alive. She indicated that the status of her daughter is unknown.  Social History    Social History   Socioeconomic History   Marital status: Married    Spouse name: Carloyn Manner   Number of children: 4   Years of education: Not on file   Highest education level: 11th grade  Occupational History   Occupation: na  Tobacco Use   Smoking status: Former    Packs/day: 1.00    Years: 5.00    Total pack years: 5.00    Types: Cigarettes    Quit date: 04/17/2005    Years since quitting: 16.9   Smokeless tobacco: Never  Vaping Use   Vaping Use: Never used  Substance and Sexual Activity   Alcohol use: Not Currently    Alcohol/week: 14.0 standard drinks of alcohol    Types: 14 Cans of beer per week    Comment: quit ~2020   Drug use: No   Sexual activity: Not Currently  Other Topics Concern   Not on file  Social History Narrative   Lives in mobile home paid for   Social Determinants of Health   Financial Resource Strain: Low Risk  (05/19/2020)   Overall Financial Resource Strain (CARDIA)    Difficulty of Paying Living Expenses: Not hard at all  Food Insecurity: No  Food Insecurity (05/11/2021)   Hunger Vital Sign    Worried About Running Out of Food in the Last Year: Never true    Ran Out of Food in the Last Year: Never true  Transportation Needs: No Transportation Needs (05/11/2021)   PRAPARE - Hydrologist (Medical): No    Lack of Transportation (Non-Medical): No  Physical Activity: Insufficiently Active (05/19/2020)   Exercise Vital Sign    Days of Exercise per Week: 7 days    Minutes of Exercise per Session: 10 min  Stress: Stress Concern Present (05/28/2020)   [place]    Feeling of Stress : To some extent  Social Connections: Moderately Isolated (05/28/2020)   Social Connection and Isolation Panel [NHANES]    Frequency of Communication with Friends and Family: More than three times a week    Frequency of Social Gatherings with Friends and Family: More than three times a week    Attends Religious Services: Never    Marine scientist or Organizations: No    Attends Archivist Meetings: Never    Marital Status: Married  Human resources officer Violence: Not At Risk (05/19/2020)   Humiliation, Afraid, Rape, and Kick questionnaire    Fear of Current or Ex-Partner: No    Emotionally Abused: No    Physically Abused: No    Sexually Abused: No     Review of Systems    General:  No chills, fever, night sweats or weight changes.  Cardiovascular:  No chest pain, dyspnea on exertion, edema, orthopnea, palpitations, paroxysmal nocturnal dyspnea. Dermatological: No rash, lesions/masses Respiratory: No cough, dyspnea Urologic: No hematuria, dysuria Abdominal:   No nausea, vomiting, diarrhea, bright red blood per rectum, melena, or hematemesis Neurologic:  No visual changes, wkns, changes in mental status. All other systems reviewed and are otherwise negative except as noted above.     Physical  Exam    VS:  BP 110/78 (BP Location: Left Arm,  Patient Position: Sitting, Cuff Size: Normal)   Pulse 89   Ht 5\' 2"  (1.575 m)   Wt 170 lb 6 oz (77.3 kg)   SpO2 98%   BMI 31.16 kg/m  , BMI Body mass index is 31.16 kg/m.     GEN: Well nourished, well developed, in no acute distress. HEENT: normal. Neck: Supple, no JVD, carotid bruits, or masses. Cardiac: RRR, no murmurs, rubs, or gallops. No clubbing, cyanosis, trace edema venous discoloration to bilateral ankles.  Radials/DP/PT 2+ and equal bilaterally.  Respiratory:  Respirations regular and unlabored, clear to auscultation bilaterally. GI: Soft, nontender, nondistended, BS + x 4. MS: no deformity or atrophy. Skin: warm and dry, no rash. Neuro:  Strength and sensation are intact. Psych: Normal affect.  Accessory Clinical Findings    ECG personally reviewed by me today- No new tracings were completed today.  Lab Results  Component Value Date   WBC 8.2 02/17/2022   HGB 11.4 (L) 02/17/2022   HCT 35.7 (L) 02/17/2022   MCV 89.5 02/17/2022   PLT 236 02/17/2022   Lab Results  Component Value Date   CREATININE 1.01 (H) 02/17/2022   BUN 12 02/17/2022   NA 133 (L) 02/17/2022   K 4.2 02/17/2022   CL 96 (L) 02/17/2022   CO2 27 02/17/2022   Lab Results  Component Value Date   ALT 10 02/01/2022   AST 16 02/01/2022   ALKPHOS 58 02/01/2022   BILITOT 0.5 02/01/2022   Lab Results  Component Value Date   CHOL 163 10/06/2021   HDL 40 10/06/2021   LDLCALC 96 10/06/2021   TRIG 152 (H) 10/06/2021   CHOLHDL 4.1 10/06/2021    Lab Results  Component Value Date   HGBA1C 9.3 (A) 01/06/2022    Assessment & Plan   1.  Bilateral lower extremity edema that has now resolved with the initiation of HCTZ 12.5 mg daily. BMP in three months prior to return visit. Venous ultrasound was negative form DVT. ABI's revealed normal results. Encouraged to participate in conservative therapy of elevating extremities, foot calf pumps, decreasing sodium intake and compression socks. If continued  complaints for likely venous insufficiency can refer to vascular for continued work-up.   2. pSVT with no further complaints of palpitations. Continue metoprolol 50 mg bid. If she has recurrent SVT will consider cMRI and referral to EP to exclude myocardial infiltration in the setting of sarcoidosis.  3. Coronary artery disease with mild CAD noted on coronary CTA. Currently remains without anginal symptoms. No ischemic work-up needed at this time. Continue asa and statin therapy.   4. Hyperlipidemia and LDL 96, continue rosuvastatin 5 mg daily. Followed by PCP.   5. Essential hypertension with blood pressure 110/78. Well controlled today. Continue metoprolol and HCTZ. Patient does request a refill on her HCTZ sent to pharmacy of choice.  6. Disposition follow up with MD/APP in 3 months with BMP prior to return.  Bunny Lowdermilk, NP 04/05/2022, 9:44 AM

## 2022-04-07 ENCOUNTER — Ambulatory Visit: Payer: Medicaid Other | Admitting: Gerontology

## 2022-04-08 ENCOUNTER — Other Ambulatory Visit: Payer: Self-pay

## 2022-04-08 LAB — PROTEIN / CREATININE RATIO, URINE
Albumin, U: 80
Creatinine, Urine: 300

## 2022-04-08 LAB — MICROALBUMIN / CREATININE URINE RATIO: Microalb Creat Ratio: <30

## 2022-04-08 MED ORDER — OZEMPIC (1 MG/DOSE) 4 MG/3ML ~~LOC~~ SOPN
1.0000 mg | PEN_INJECTOR | SUBCUTANEOUS | 0 refills | Status: DC
  Filled 2022-04-08: qty 3, 28d supply, fill #0

## 2022-04-08 MED ORDER — KERENDIA 10 MG PO TABS
10.0000 mg | ORAL_TABLET | Freq: Every day | ORAL | 3 refills | Status: DC
  Filled 2022-04-08: qty 30, 30d supply, fill #0

## 2022-04-08 MED ORDER — DAPAGLIFLOZIN PROPANEDIOL 10 MG PO TABS
10.0000 mg | ORAL_TABLET | Freq: Every day | ORAL | 3 refills | Status: DC
  Filled 2022-04-08: qty 30, 30d supply, fill #0

## 2022-04-08 MED ORDER — CLONIDINE HCL 0.1 MG PO TABS
0.1000 mg | ORAL_TABLET | Freq: Two times a day (BID) | ORAL | 0 refills | Status: DC
  Filled 2022-04-08 – 2022-04-12 (×2): qty 60, 30d supply, fill #0

## 2022-04-12 ENCOUNTER — Ambulatory Visit: Payer: Self-pay | Admitting: Licensed Clinical Social Worker

## 2022-04-12 ENCOUNTER — Other Ambulatory Visit: Payer: Self-pay

## 2022-04-12 DIAGNOSIS — F411 Generalized anxiety disorder: Secondary | ICD-10-CM

## 2022-04-12 DIAGNOSIS — F331 Major depressive disorder, recurrent, moderate: Secondary | ICD-10-CM

## 2022-04-13 ENCOUNTER — Emergency Department: Payer: Medicaid Other

## 2022-04-13 ENCOUNTER — Emergency Department
Admission: EM | Admit: 2022-04-13 | Discharge: 2022-04-13 | Disposition: A | Discharge status: Home / Self Care | Payer: Medicaid Other | Attending: Emergency Medicine | Admitting: Emergency Medicine

## 2022-04-13 ENCOUNTER — Other Ambulatory Visit: Payer: Self-pay

## 2022-04-13 DIAGNOSIS — R1010 Upper abdominal pain, unspecified: Secondary | ICD-10-CM | POA: Insufficient documentation

## 2022-04-13 DIAGNOSIS — R000 Tachycardia, unspecified: Secondary | ICD-10-CM | POA: Diagnosis not present

## 2022-04-13 DIAGNOSIS — R109 Unspecified abdominal pain: Secondary | ICD-10-CM

## 2022-04-13 DIAGNOSIS — R112 Nausea with vomiting, unspecified: Secondary | ICD-10-CM | POA: Diagnosis not present

## 2022-04-13 LAB — COMPREHENSIVE METABOLIC PANEL
ALT: 19 U/L (ref 0–44)
AST: 16 U/L (ref 15–41)
Albumin: 3.6 g/dL (ref 3.5–5.0)
Alkaline Phosphatase: 55 U/L (ref 38–126)
Anion gap: 16 — ABNORMAL HIGH (ref 5–15)
BUN: 26 mg/dL — ABNORMAL HIGH (ref 8–23)
CO2: 18 mmol/L — ABNORMAL LOW (ref 22–32)
Calcium: 9.1 mg/dL (ref 8.9–10.3)
Chloride: 104 mmol/L (ref 98–111)
Creatinine, Ser: 1.35 mg/dL — ABNORMAL HIGH (ref 0.44–1.00)
GFR, Estimated: 45 mL/min — ABNORMAL LOW (ref >60)
Glucose, Bld: 144 mg/dL — ABNORMAL HIGH (ref 70–99)
Potassium: 3.9 mmol/L (ref 3.5–5.1)
Sodium: 138 mmol/L (ref 135–145)
Total Bilirubin: 1.2 mg/dL (ref 0.3–1.2)
Total Protein: 6.7 g/dL (ref 6.5–8.1)

## 2022-04-13 LAB — URINALYSIS, ROUTINE W REFLEX MICROSCOPIC
Bilirubin Urine: NEGATIVE
Glucose, UA: >=500 mg/dL — AB
Hgb urine dipstick: NEGATIVE
Ketones, ur: 80 mg/dL — AB
Nitrite: NEGATIVE
Protein, ur: NEGATIVE mg/dL
Specific Gravity, Urine: 1.027 (ref 1.005–1.030)
pH: 5 (ref 5.0–8.0)

## 2022-04-13 LAB — CBC
HCT: 44.9 % (ref 36.0–46.0)
Hemoglobin: 14.7 g/dL (ref 12.0–15.0)
MCH: 27.6 pg (ref 26.0–34.0)
MCHC: 32.7 g/dL (ref 30.0–36.0)
MCV: 84.2 fL (ref 80.0–100.0)
Platelets: 270 10*3/uL (ref 150–400)
RBC: 5.33 MIL/uL — ABNORMAL HIGH (ref 3.87–5.11)
RDW: 13.9 % (ref 11.5–15.5)
WBC: 11.6 10*3/uL — ABNORMAL HIGH (ref 4.0–10.5)
nRBC: 0 % (ref 0.0–0.2)

## 2022-04-13 LAB — LIPASE, BLOOD: Lipase: 75 U/L — ABNORMAL HIGH (ref 11–51)

## 2022-04-13 MED ORDER — IOHEXOL 300 MG/ML  SOLN
100.0000 mL | Freq: Once | INTRAMUSCULAR | Status: AC | PRN
  Administered 2022-04-13: 100 mL via INTRAVENOUS

## 2022-04-13 MED ORDER — METOCLOPRAMIDE HCL 5 MG/ML IJ SOLN
10.0000 mg | Freq: Once | INTRAMUSCULAR | Status: AC
  Administered 2022-04-13: 10 mg via INTRAVENOUS
  Filled 2022-04-13: qty 2

## 2022-04-13 MED ORDER — METOCLOPRAMIDE HCL 10 MG PO TABS
10.0000 mg | ORAL_TABLET | Freq: Four times a day (QID) | ORAL | 0 refills | Status: DC | PRN
  Filled 2022-04-13: qty 30, 8d supply, fill #0

## 2022-04-13 MED ORDER — SODIUM CHLORIDE 0.9 % IV BOLUS
1000.0000 mL | Freq: Once | INTRAVENOUS | Status: AC
  Administered 2022-04-13: 1000 mL via INTRAVENOUS

## 2022-04-13 NOTE — ED Notes (Signed)
E-signature pad unavailable - Pt verbalized understanding of D/C information - no additional concerns at this time.  

## 2022-04-13 NOTE — ED Triage Notes (Signed)
Pt states that she has been vomiting for the past 4 days, states everything she eats comes up, states that she hasn't had a bm since last week and states is abnormal for her, denies hx of bowel obstruction, pt states that she is able to pass gas

## 2022-04-13 NOTE — ED Provider Notes (Signed)
[facility] Provider Note    Event Date/Time   First MD Initiated Contact with Patient 04/13/22 1757     (approximate)   History   Abdominal Pain   HPI  Renee Bush is a 61 y.o. female   who presents to the emergency department today because of concerns for nausea and vomiting.  She also states that she has had some abdominal pain.  Symptoms started about 4 days ago.  She has had a hard time tolerating any p.o.  The abdominal pain is located in the upper abdomen.  She also has had decreased bowel movements.  She denies similar symptoms in the past.  Denies any unusual ingestions.  Denies any known sick contacts.   Physical Exam   Triage Vital Signs: ED Triage Vitals  Enc Vitals Group     BP 04/13/22 1722 (!) 131/94     Pulse Rate 04/13/22 1722 (!) 126     Resp 04/13/22 1722 (!) 22     Temp 04/13/22 1722 98.4 F (36.9 C)     Temp Source 04/13/22 1722 Oral     SpO2 04/13/22 1722 100 %     Weight 04/13/22 1723 169 lb (76.7 kg)     Height 04/13/22 1723 5\' 2"  (1.575 m)     Head Circumference --      Peak Flow --      Pain Score 04/13/22 1723 9     Pain Loc --      Pain Edu? --      Excl. in GC? --     Most recent vital signs: Vitals:   04/13/22 1722  BP: (!) 131/94  Pulse: (!) 126  Resp: (!) 22  Temp: 98.4 F (36.9 C)  SpO2: 100%    General: Awake, alert, oriented. CV:  Good peripheral perfusion. Tachycardia, regular rhythm. Resp:  Normal effort. Lungs clear. Abd:  No distention. Minimally tender to palpation in the upper abdomen.    ED Results / Procedures / Treatments   Labs (all labs ordered are listed, but only abnormal results are displayed) Labs Reviewed  CBC - Abnormal; Notable for the following components:      Result Value   WBC 11.6 (*)    RBC 5.33 (*)    All other components within normal limits  URINALYSIS, ROUTINE W REFLEX MICROSCOPIC - Abnormal; Notable for the following components:   Color, Urine YELLOW  (*)    APPearance HAZY (*)    Glucose, UA >=500 (*)    Ketones, ur 80 (*)    Leukocytes,Ua TRACE (*)    Bacteria, UA RARE (*)    All other components within normal limits  COMPREHENSIVE METABOLIC PANEL - Abnormal; Notable for the following components:   CO2 18 (*)    Glucose, Bld 144 (*)    BUN 26 (*)    Creatinine, Ser 1.35 (*)    GFR, Estimated 45 (*)    Anion gap 16 (*)    All other components within normal limits  LIPASE, BLOOD - Abnormal; Notable for the following components:   Lipase 75 (*)    All other components within normal limits     EKG  None   RADIOLOGY I independently interpreted and visualized the CT abd/pel. My interpretation: No free air. Radiology interpretation:  IMPRESSION:  No acute abnormality in the abdomen or pelvis.    Increased size of the retroperitoneal and porta hepatic lymph nodes  in the upper abdomen. These remain  nonspecific and may be related to  the history of sarcoid. Differential considerations include  metastatic adenopathy and lymphoproliferative disorder.    Aortic Atherosclerosis (ICD10-I70.0).     PROCEDURES:  Critical Care performed: No  Procedures   MEDICATIONS ORDERED IN ED: Medications - No data to display   IMPRESSION / MDM / ASSESSMENT AND PLAN / ED COURSE  I reviewed the triage vital signs and the nursing notes.                              Differential diagnosis includes, but is not limited to, pancreatitis, hepatitis, gallstones, gastroenteritis.  Patient's presentation is most consistent with acute presentation with potential threat to life or bodily function.  Patient presented to the emergency department today with concerns for nausea and vomiting.  Patient also had some abdominal pain.  Work-up here with very mild leukocytosis.  Mild elevation of lipase.  Slight elevation of creatinine.  Did obtain CT scan which did not show any concerning pancreatic inflammation or other acute intra-abdominal process.   Patient did feel better after IV fluids and medication.  At this time think likely gastroenteritis.  Discussed with patient.  Patient did feel comfortable being discharged home with antiemetics.     FINAL CLINICAL IMPRESSION(S) / ED DIAGNOSES   Final diagnoses:  Abdominal pain, unspecified abdominal location  Nausea and vomiting, unspecified vomiting type      Note:  This document was prepared using Dragon voice recognition software and may include unintentional dictation errors.    Phineas Semen, MD 04/13/22 2322

## 2022-04-13 NOTE — ED Notes (Signed)
Pt back from CT

## 2022-04-13 NOTE — ED Notes (Signed)
See triage note.  Pt endorses intermittent ABD pain. Pt states s/s have been waxing and weaning for the past few weeks but states it came back and has been worse than before. Pt denies fevers or chills. Pt does endorses she is passing flatus.   This RN attempted PIV x2 with only able to obtain the IV and some blood work, another Charity fundraiser attempted and also was not able to get the rest of the blood work, pt states "no more blood work", pt educated on the need for blood work to help diagnose pt. Pt is A&Ox4.

## 2022-04-13 NOTE — Discharge Instructions (Signed)
Please seek medical attention for any high fevers, chest pain, shortness of breath, change in behavior, persistent vomiting, bloody stool or any other new or concerning symptoms.  

## 2022-04-14 ENCOUNTER — Other Ambulatory Visit: Payer: Self-pay

## 2022-04-16 ENCOUNTER — Inpatient Hospital Stay
Admission: EM | Admit: 2022-04-16 | Discharge: 2022-04-19 | DRG: 638 | Disposition: A | Discharge status: Home Health | Payer: Medicaid Other | Attending: Internal Medicine | Admitting: Internal Medicine

## 2022-04-16 ENCOUNTER — Other Ambulatory Visit: Payer: Self-pay

## 2022-04-16 ENCOUNTER — Encounter: Payer: Self-pay | Admitting: Emergency Medicine

## 2022-04-16 DIAGNOSIS — N1831 Chronic kidney disease, stage 3a: Secondary | ICD-10-CM | POA: Diagnosis not present

## 2022-04-16 DIAGNOSIS — E7800 Pure hypercholesterolemia, unspecified: Secondary | ICD-10-CM | POA: Diagnosis present

## 2022-04-16 DIAGNOSIS — Z8249 Family history of ischemic heart disease and other diseases of the circulatory system: Secondary | ICD-10-CM | POA: Diagnosis not present

## 2022-04-16 DIAGNOSIS — Z83438 Family history of other disorder of lipoprotein metabolism and other lipidemia: Secondary | ICD-10-CM | POA: Diagnosis not present

## 2022-04-16 DIAGNOSIS — I129 Hypertensive chronic kidney disease with stage 1 through stage 4 chronic kidney disease, or unspecified chronic kidney disease: Secondary | ICD-10-CM | POA: Diagnosis present

## 2022-04-16 DIAGNOSIS — E1122 Type 2 diabetes mellitus with diabetic chronic kidney disease: Secondary | ICD-10-CM | POA: Diagnosis present

## 2022-04-16 DIAGNOSIS — R1084 Generalized abdominal pain: Secondary | ICD-10-CM | POA: Diagnosis not present

## 2022-04-16 DIAGNOSIS — Z79899 Other long term (current) drug therapy: Secondary | ICD-10-CM | POA: Diagnosis not present

## 2022-04-16 DIAGNOSIS — Z794 Long term (current) use of insulin: Secondary | ICD-10-CM | POA: Diagnosis not present

## 2022-04-16 DIAGNOSIS — N183 Chronic kidney disease, stage 3 unspecified: Secondary | ICD-10-CM | POA: Diagnosis present

## 2022-04-16 DIAGNOSIS — Z820 Family history of epilepsy and other diseases of the nervous system: Secondary | ICD-10-CM

## 2022-04-16 DIAGNOSIS — I700 Atherosclerosis of aorta: Secondary | ICD-10-CM | POA: Diagnosis present

## 2022-04-16 DIAGNOSIS — E1169 Type 2 diabetes mellitus with other specified complication: Secondary | ICD-10-CM | POA: Diagnosis present

## 2022-04-16 DIAGNOSIS — Z87891 Personal history of nicotine dependence: Secondary | ICD-10-CM | POA: Diagnosis not present

## 2022-04-16 DIAGNOSIS — E1110 Type 2 diabetes mellitus with ketoacidosis without coma: Principal | ICD-10-CM | POA: Diagnosis present

## 2022-04-16 DIAGNOSIS — Z7985 Long-term (current) use of injectable non-insulin antidiabetic drugs: Secondary | ICD-10-CM

## 2022-04-16 DIAGNOSIS — E876 Hypokalemia: Secondary | ICD-10-CM

## 2022-04-16 DIAGNOSIS — Z7984 Long term (current) use of oral hypoglycemic drugs: Secondary | ICD-10-CM | POA: Diagnosis not present

## 2022-04-16 DIAGNOSIS — Z833 Family history of diabetes mellitus: Secondary | ICD-10-CM

## 2022-04-16 DIAGNOSIS — Z825 Family history of asthma and other chronic lower respiratory diseases: Secondary | ICD-10-CM

## 2022-04-16 DIAGNOSIS — D869 Sarcoidosis, unspecified: Secondary | ICD-10-CM | POA: Diagnosis present

## 2022-04-16 DIAGNOSIS — K219 Gastro-esophageal reflux disease without esophagitis: Secondary | ICD-10-CM | POA: Diagnosis present

## 2022-04-16 DIAGNOSIS — E785 Hyperlipidemia, unspecified: Secondary | ICD-10-CM | POA: Diagnosis not present

## 2022-04-16 DIAGNOSIS — N179 Acute kidney failure, unspecified: Secondary | ICD-10-CM | POA: Diagnosis present

## 2022-04-16 DIAGNOSIS — I10 Essential (primary) hypertension: Secondary | ICD-10-CM | POA: Diagnosis present

## 2022-04-16 DIAGNOSIS — Z832 Family history of diseases of the blood and blood-forming organs and certain disorders involving the immune mechanism: Secondary | ICD-10-CM | POA: Diagnosis not present

## 2022-04-16 DIAGNOSIS — E1010 Type 1 diabetes mellitus with ketoacidosis without coma: Principal | ICD-10-CM

## 2022-04-16 DIAGNOSIS — F411 Generalized anxiety disorder: Secondary | ICD-10-CM | POA: Diagnosis present

## 2022-04-16 DIAGNOSIS — R109 Unspecified abdominal pain: Secondary | ICD-10-CM | POA: Diagnosis present

## 2022-04-16 HISTORY — DX: Type 2 diabetes mellitus with ketoacidosis without coma: E11.10

## 2022-04-16 LAB — URINALYSIS, ROUTINE W REFLEX MICROSCOPIC
Bilirubin Urine: NEGATIVE
Glucose, UA: >=500 mg/dL — AB
Hgb urine dipstick: NEGATIVE
Ketones, ur: 80 mg/dL — AB
Leukocytes,Ua: NEGATIVE
Nitrite: NEGATIVE
Protein, ur: 30 mg/dL — AB
Specific Gravity, Urine: 1.024 (ref 1.005–1.030)
pH: 5 (ref 5.0–8.0)

## 2022-04-16 LAB — BASIC METABOLIC PANEL
Anion gap: 22 — ABNORMAL HIGH (ref 5–15)
Anion gap: 21 — ABNORMAL HIGH (ref 5–15)
BUN: 27 mg/dL — ABNORMAL HIGH (ref 8–23)
BUN: 24 mg/dL — ABNORMAL HIGH (ref 8–23)
CO2: 13 mmol/L — ABNORMAL LOW (ref 22–32)
CO2: 9 mmol/L — ABNORMAL LOW (ref 22–32)
Calcium: 10.5 mg/dL — ABNORMAL HIGH (ref 8.9–10.3)
Calcium: 9.4 mg/dL (ref 8.9–10.3)
Chloride: 102 mmol/L (ref 98–111)
Chloride: 109 mmol/L (ref 98–111)
Creatinine, Ser: 1.81 mg/dL — ABNORMAL HIGH (ref 0.44–1.00)
Creatinine, Ser: 1.46 mg/dL — ABNORMAL HIGH (ref 0.44–1.00)
GFR, Estimated: 31 mL/min — ABNORMAL LOW (ref >60)
GFR, Estimated: 41 mL/min — ABNORMAL LOW (ref >60)
Glucose, Bld: 260 mg/dL — ABNORMAL HIGH (ref 70–99)
Glucose, Bld: 244 mg/dL — ABNORMAL HIGH (ref 70–99)
Potassium: 4.4 mmol/L (ref 3.5–5.1)
Potassium: 4 mmol/L (ref 3.5–5.1)
Sodium: 137 mmol/L (ref 135–145)
Sodium: 139 mmol/L (ref 135–145)

## 2022-04-16 LAB — CBG MONITORING, ED
Glucose-Capillary: 208 mg/dL — ABNORMAL HIGH (ref 70–99)
Glucose-Capillary: 224 mg/dL — ABNORMAL HIGH (ref 70–99)
Glucose-Capillary: 236 mg/dL — ABNORMAL HIGH (ref 70–99)
Glucose-Capillary: 205 mg/dL — ABNORMAL HIGH (ref 70–99)
Glucose-Capillary: 204 mg/dL — ABNORMAL HIGH (ref 70–99)

## 2022-04-16 LAB — HEPATIC FUNCTION PANEL
ALT: 17 U/L (ref 0–44)
AST: 17 U/L (ref 15–41)
Albumin: 3.7 g/dL (ref 3.5–5.0)
Alkaline Phosphatase: 59 U/L (ref 38–126)
Bilirubin, Direct: <0.1 mg/dL (ref 0.0–0.2)
Total Bilirubin: 1.8 mg/dL — ABNORMAL HIGH (ref 0.3–1.2)
Total Protein: 7.3 g/dL (ref 6.5–8.1)

## 2022-04-16 LAB — BLOOD GAS, VENOUS
Acid-base deficit: 14.9 mmol/L — ABNORMAL HIGH (ref 0.0–2.0)
Acid-base deficit: 18.4 mmol/L — ABNORMAL HIGH (ref 0.0–2.0)
Bicarbonate: 10.7 mmol/L — ABNORMAL LOW (ref 20.0–28.0)
Bicarbonate: 11.6 mmol/L — ABNORMAL LOW (ref 20.0–28.0)
O2 Saturation: 54 %
O2 Saturation: 91.9 %
Patient temperature: 37
Patient temperature: 37
pCO2, Ven: 25 mmHg — ABNORMAL LOW (ref 44–60)
pCO2, Ven: 42 mmHg — ABNORMAL LOW (ref 44–60)
pH, Ven: 7.05 — CL (ref 7.25–7.43)
pH, Ven: 7.24 — ABNORMAL LOW (ref 7.25–7.43)
pO2, Ven: 38 mmHg (ref 32–45)
pO2, Ven: 61 mmHg — ABNORMAL HIGH (ref 32–45)

## 2022-04-16 LAB — CBC
HCT: 48.1 % — ABNORMAL HIGH (ref 36.0–46.0)
Hemoglobin: 15.4 g/dL — ABNORMAL HIGH (ref 12.0–15.0)
MCH: 27.6 pg (ref 26.0–34.0)
MCHC: 32 g/dL (ref 30.0–36.0)
MCV: 86.2 fL (ref 80.0–100.0)
Platelets: 396 10*3/uL (ref 150–400)
RBC: 5.58 MIL/uL — ABNORMAL HIGH (ref 3.87–5.11)
RDW: 14.1 % (ref 11.5–15.5)
WBC: 10.1 10*3/uL (ref 4.0–10.5)
nRBC: 0 % (ref 0.0–0.2)

## 2022-04-16 LAB — LACTIC ACID, PLASMA
Lactic Acid, Venous: 2.6 mmol/L (ref 0.5–1.9)
Lactic Acid, Venous: 2.3 mmol/L (ref 0.5–1.9)

## 2022-04-16 MED ORDER — ALBUTEROL SULFATE (2.5 MG/3ML) 0.083% IN NEBU: 3.0000 mL | INHALATION_SOLUTION | Freq: Four times a day (QID) | RESPIRATORY_TRACT | Status: DC | PRN

## 2022-04-16 MED ORDER — ONDANSETRON HCL 4 MG/2ML IJ SOLN
4.0000 mg | Freq: Once | INTRAMUSCULAR | Status: AC
  Administered 2022-04-16: 4 mg via INTRAVENOUS
  Filled 2022-04-16: qty 2

## 2022-04-16 MED ORDER — PANTOPRAZOLE SODIUM 40 MG IV SOLR
40.0000 mg | INTRAVENOUS | Status: DC
Start: 2022-04-16 — End: 2022-04-19
  Administered 2022-04-16 – 2022-04-18 (×3): 40 mg via INTRAVENOUS
  Filled 2022-04-16 (×3): qty 10

## 2022-04-16 MED ORDER — LACTATED RINGERS IV SOLN
INTRAVENOUS | Status: DC
Start: 2022-04-16 — End: 2022-04-18

## 2022-04-16 MED ORDER — INSULIN REGULAR(HUMAN) IN NACL 100-0.9 UT/100ML-% IV SOLN
INTRAVENOUS | Status: DC
  Filled 2022-04-16: qty 100

## 2022-04-16 MED ORDER — DEXTROSE IN LACTATED RINGERS 5 % IV SOLN: INTRAVENOUS | Status: DC

## 2022-04-16 MED ORDER — ONDANSETRON HCL 4 MG/2ML IJ SOLN
4.0000 mg | Freq: Four times a day (QID) | INTRAMUSCULAR | Status: DC | PRN
  Administered 2022-04-16 – 2022-04-19 (×4): 4 mg via INTRAVENOUS
  Filled 2022-04-16 (×4): qty 2

## 2022-04-16 MED ORDER — DEXTROSE 50 % IV SOLN: 0.0000 mL | INTRAVENOUS | Status: DC | PRN

## 2022-04-16 MED ORDER — INSULIN REGULAR(HUMAN) IN NACL 100-0.9 UT/100ML-% IV SOLN
INTRAVENOUS | Status: DC
  Administered 2022-04-16: 4.2 [IU]/h via INTRAVENOUS

## 2022-04-16 MED ORDER — HYDRALAZINE HCL 20 MG/ML IJ SOLN
5.0000 mg | Freq: Three times a day (TID) | INTRAMUSCULAR | Status: DC | PRN
  Administered 2022-04-17: 5 mg via INTRAVENOUS
  Filled 2022-04-16: qty 1

## 2022-04-16 MED ORDER — LACTATED RINGERS IV SOLN: INTRAVENOUS | Status: DC

## 2022-04-16 MED ORDER — MELATONIN 5 MG PO TABS
2.5000 mg | ORAL_TABLET | Freq: Every day | ORAL | Status: DC
  Administered 2022-04-18: 2.5 mg via ORAL
  Filled 2022-04-16 (×2): qty 1

## 2022-04-16 MED ORDER — HYDROXYZINE HCL 25 MG PO TABS: 25.0000 mg | ORAL_TABLET | Freq: Two times a day (BID) | ORAL | Status: DC | PRN

## 2022-04-16 MED ORDER — SODIUM CHLORIDE 0.9 % IV BOLUS
1000.0000 mL | Freq: Once | INTRAVENOUS | Status: AC
  Administered 2022-04-16: 1000 mL via INTRAVENOUS

## 2022-04-16 MED ORDER — TRAZODONE HCL 50 MG PO TABS
150.0000 mg | ORAL_TABLET | Freq: Every day | ORAL | Status: DC
  Administered 2022-04-16 – 2022-04-18 (×3): 150 mg via ORAL
  Filled 2022-04-16 (×3): qty 1

## 2022-04-16 MED ORDER — ROSUVASTATIN CALCIUM 5 MG PO TABS
5.0000 mg | ORAL_TABLET | Freq: Every day | ORAL | Status: DC
  Administered 2022-04-18 – 2022-04-19 (×2): 5 mg via ORAL
  Filled 2022-04-16 (×4): qty 1

## 2022-04-16 MED ORDER — SODIUM CHLORIDE 0.9% FLUSH
3.0000 mL | Freq: Two times a day (BID) | INTRAVENOUS | Status: DC
  Administered 2022-04-17 – 2022-04-19 (×5): 3 mL via INTRAVENOUS

## 2022-04-16 MED ORDER — FERROUS SULFATE 325 (65 FE) MG PO TABS
325.0000 mg | ORAL_TABLET | Freq: Every morning | ORAL | Status: DC
  Administered 2022-04-18 – 2022-04-19 (×2): 325 mg via ORAL
  Filled 2022-04-16 (×3): qty 1

## 2022-04-16 MED ORDER — LACTATED RINGERS IV BOLUS: 1000.0000 mL | Freq: Once | INTRAVENOUS | Status: DC

## 2022-04-16 MED ORDER — DULOXETINE HCL 30 MG PO CPEP
60.0000 mg | ORAL_CAPSULE | Freq: Every day | ORAL | Status: DC
  Administered 2022-04-16 – 2022-04-19 (×3): 60 mg via ORAL
  Filled 2022-04-16 (×2): qty 2
  Filled 2022-04-16: qty 1
  Filled 2022-04-16: qty 2

## 2022-04-16 MED ORDER — LORATADINE 10 MG PO TABS
10.0000 mg | ORAL_TABLET | Freq: Every day | ORAL | Status: DC
  Administered 2022-04-16 – 2022-04-19 (×3): 10 mg via ORAL
  Filled 2022-04-16 (×4): qty 1

## 2022-04-16 MED ORDER — LACTATED RINGERS IV BOLUS: 20.0000 mL/kg | Freq: Once | INTRAVENOUS | Status: DC

## 2022-04-16 MED ORDER — LACTATED RINGERS IV BOLUS
2000.0000 mL | Freq: Once | INTRAVENOUS | Status: AC
Start: 2022-04-16 — End: 2022-04-17
  Administered 2022-04-16: 2000 mL via INTRAVENOUS

## 2022-04-16 MED ORDER — SODIUM CHLORIDE 0.9 % IV BOLUS: 1000.0000 mL | Freq: Once | INTRAVENOUS | Status: DC

## 2022-04-16 MED ORDER — MONTELUKAST SODIUM 10 MG PO TABS
10.0000 mg | ORAL_TABLET | Freq: Every day | ORAL | Status: DC
  Administered 2022-04-16 – 2022-04-19 (×3): 10 mg via ORAL
  Filled 2022-04-16 (×4): qty 1

## 2022-04-16 MED ORDER — HYDROCHLOROTHIAZIDE 12.5 MG PO CAPS: 12.5000 mg | ORAL_CAPSULE | Freq: Every day | ORAL | Status: DC

## 2022-04-16 MED ORDER — POTASSIUM CHLORIDE 10 MEQ/100ML IV SOLN: 10.0000 meq | INTRAVENOUS | Status: AC

## 2022-04-16 MED ORDER — LIDOCAINE HCL (PF) 1 % IJ SOLN
INTRAMUSCULAR | Status: AC
  Administered 2022-04-16: 5 mL
  Filled 2022-04-16: qty 5

## 2022-04-16 MED ORDER — METOCLOPRAMIDE HCL 10 MG PO TABS
10.0000 mg | ORAL_TABLET | Freq: Four times a day (QID) | ORAL | Status: DC | PRN
  Administered 2022-04-18: 10 mg via ORAL
  Filled 2022-04-16: qty 1

## 2022-04-16 MED ORDER — POTASSIUM CHLORIDE 10 MEQ/100ML IV SOLN
10.0000 meq | INTRAVENOUS | Status: AC
  Filled 2022-04-16 (×2): qty 100

## 2022-04-16 MED ORDER — SENNOSIDES-DOCUSATE SODIUM 8.6-50 MG PO TABS
1.0000 | ORAL_TABLET | Freq: Every day | ORAL | Status: DC
  Administered 2022-04-16 – 2022-04-19 (×3): 1 via ORAL
  Filled 2022-04-16 (×4): qty 1

## 2022-04-16 MED ORDER — ENOXAPARIN SODIUM 40 MG/0.4ML IJ SOSY
40.0000 mg | PREFILLED_SYRINGE | INTRAMUSCULAR | Status: DC
  Administered 2022-04-16 – 2022-04-18 (×3): 40 mg via SUBCUTANEOUS
  Filled 2022-04-16 (×3): qty 0.4

## 2022-04-16 NOTE — ED Provider Notes (Signed)
[facility] Provider Note    Event Date/Time   First MD Initiated Contact with Patient 04/16/22 1518     (approximate)   History   Abdominal Pain and Emesis   HPI  Renee Bush is a 61 y.o. female with history of sarcoid presents to the ER for evaluation of persistent abdominal discomfort nausea vomiting and unable to take any fluids at home.  Feeling very weak and sluggish fatigue.  Has had chills no measured temperature.     Physical Exam   Triage Vital Signs: ED Triage Vitals  Enc Vitals Group     BP 04/16/22 1446 134/84     Pulse Rate 04/16/22 1446 (!) 140     Resp 04/16/22 1446 20     Temp 04/16/22 1446 97.9 F (36.6 C)     Temp Source 04/16/22 1446 Oral     SpO2 04/16/22 1446 97 %     Weight 04/16/22 1447 169 lb (76.7 kg)     Height 04/16/22 1447 5\' 2"  (1.575 m)     Head Circumference --      Peak Flow --      Pain Score 04/16/22 1447 0     Pain Loc --      Pain Edu? --      Excl. in GC? --     Most recent vital signs: Vitals:   04/16/22 1600 04/16/22 1630  BP: 138/82 (!) 155/86  Pulse:  (!) 117  Resp:  16  Temp:  98.2 F (36.8 C)  SpO2: 99% 100%     Constitutional: Alert  Eyes: Conjunctivae are normal.  Head: Atraumatic. Nose: No congestion/rhinnorhea. Mouth/Throat: Mucous membranes are moist.   Neck: Painless ROM.  Cardiovascular:   tachycardic Respiratory: mild tachypnea Gastrointestinal: Soft and nontender.  Musculoskeletal:  no deformity Neurologic:  MAE spontaneously. No gross focal neurologic deficits are appreciated.  Skin:  Skin is warm, dry and intact. No rash noted. Psychiatric: Mood and affect are normal. Speech and behavior are normal.    ED Results / Procedures / Treatments   Labs (all labs ordered are listed, but only abnormal results are displayed) Labs Reviewed  BASIC METABOLIC PANEL - Abnormal; Notable for the following components:      Result Value   CO2 13 (*)    Glucose, Bld 260  (*)    BUN 27 (*)    Creatinine, Ser 1.81 (*)    Calcium 10.5 (*)    GFR, Estimated 31 (*)    Anion gap 22 (*)    All other components within normal limits  CBC - Abnormal; Notable for the following components:   RBC 5.58 (*)    Hemoglobin 15.4 (*)    HCT 48.1 (*)    All other components within normal limits  URINALYSIS, ROUTINE W REFLEX MICROSCOPIC - Abnormal; Notable for the following components:   Color, Urine YELLOW (*)    APPearance CLEAR (*)    Glucose, UA >=500 (*)    Ketones, ur 80 (*)    Protein, ur 30 (*)    Bacteria, UA RARE (*)    All other components within normal limits  LACTIC ACID, PLASMA - Abnormal; Notable for the following components:   Lactic Acid, Venous 2.6 (*)    All other components within normal limits  BLOOD GAS, VENOUS - Abnormal; Notable for the following components:   pH, Ven 7.05 (*)    pCO2, Ven 42 (*)    Bicarbonate 11.6 (*)  Acid-base deficit 18.4 (*)    All other components within normal limits  LACTIC ACID, PLASMA  BASIC METABOLIC PANEL  BASIC METABOLIC PANEL  BASIC METABOLIC PANEL  BASIC METABOLIC PANEL  BETA-HYDROXYBUTYRIC ACID  BETA-HYDROXYBUTYRIC ACID  CBG MONITORING, ED  CBG MONITORING, ED     EKG  ED ECG REPORT I, Willy Eddy, the attending physician, personally viewed and interpreted this ECG.   Date: 04/16/2022  EKG Time: 14:54  Rate: 140  Rhythm: sinus  Axis: normal  Intervals: normal qt  ST&T Change: nonspecific st abn, likely rate dependent       PROCEDURES:  Critical Care performed: Yes, see critical care procedure note(s)  .Critical Care  Performed by: Willy Eddy, MD Authorized by: Willy Eddy, MD   Critical care provider statement:    Critical care time (minutes):  40   Critical care was necessary to treat or prevent imminent or life-threatening deterioration of the following conditions:  Endocrine crisis   Critical care was time spent personally by me on the following  activities:  Ordering and performing treatments and interventions, ordering and review of laboratory studies, ordering and review of radiographic studies, pulse oximetry, re-evaluation of patient's condition, review of old charts, obtaining history from patient or surrogate, examination of patient, evaluation of patient's response to treatment, discussions with primary provider, discussions with consultants and development of treatment plan with patient or surrogate .1-3 Lead EKG Interpretation  Performed by: Willy Eddy, MD Authorized by: Willy Eddy, MD     Interpretation: abnormal     ECG rate:  115   ECG rate assessment: tachycardic     Rhythm: sinus rhythm      MEDICATIONS ORDERED IN ED: Medications  insulin regular, human (MYXREDLIN) 100 units/ 100 mL infusion (has no administration in time range)  lactated ringers infusion (has no administration in time range)  dextrose 5 % in lactated ringers infusion (has no administration in time range)  dextrose 50 % solution 0-50 mL (has no administration in time range)  potassium chloride 10 mEq in 100 mL IVPB (has no administration in time range)  sodium chloride 0.9 % bolus 1,000 mL (1,000 mLs Intravenous New Bag/Given 04/16/22 1506)  sodium chloride 0.9 % bolus 1,000 mL (1,000 mLs Intravenous New Bag/Given 04/16/22 1550)  ondansetron (ZOFRAN) injection 4 mg (4 mg Intravenous Given 04/16/22 1549)     IMPRESSION / MDM / ASSESSMENT AND PLAN / ED COURSE  I reviewed the triage vital signs and the nursing notes.                              Differential diagnosis includes, but is not limited to, enteritis, gastritis, dehydration, electrolyte abnormality, sepsis, DKA  Patient presented to the ER for evaluation of symptoms as described above.  This presenting complaint could reflect a potentially life-threatening illness therefore the patient will be placed on continuous pulse oximetry and telemetry for monitoring.  Laboratory  evaluation will be sent to evaluate for the above complaints.   I reviewed CT imaging from her previous visit and there is no acute abnormalities.  She is tachycardic.  Dehydrated appearing.  Her blood work started out of triage does show evidence of high anion gap worsening metabolic acidosis with hyperglycemia and I am suspicious that she has DKA.  Have added on VBG white count without significant leukocytosis.  She denies any respiratory complaint she is not febrile.  Will give IV fluids.  Clinical Course  as of 04/16/22 1707  Sat Apr 16, 2022  1657 Patient reassessed.  She is improving after IV fluids.  As she does have evidence of DKA it was placed on insulin drip and infusion.  I consulted hospitalist for admission. 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    Willy Eddy, MD 04/16/22 [phone]

## 2022-04-16 NOTE — H&P (Signed)
History and Physical    Renee Bush [id number] DOB: July 08, 1961 DOA: 04/16/2022  PCP: Mechele Claude, FNP  Patient coming from: Home  Chief Complaint: Nausea and vomiting  HPI: Renee Bush is a 61 y.o. female with medical history significant of CKD, DM2, GERD, HLD, HTN, sarcoidosis who presents for 1.5 weeks of nausea and vomiting.  Ms. Maricle reports persistent nausea, vomiting for about 10 days.  She notes no preceding event, no sick contact, no change in food.  She has not been able to keep food down in that time.  She has mostly kept fluid and bills down.  She presented to the ED on 8/16 and it was felt she had a GI illness.  She notes worsening abdominal pain with vomiting in the epigastric area and development of chest pain when she vomits.  She has noted only greenish fluid, no blood in the emesis.  She has no fever.  She has had chills.  She has no change in urination, no burning when she urinates.  She is on an SGLT2 for her DM, but she does not remember the name.  She notes recently being started on [place] and Ozempic but not starting either as of yet.  No one else is sick in the family.  She has not had any diarrhea.  When in the ED on 8/16, she felt better with IVF and nausea medication and was discharged home.   ED Course: Today, she notes similar symptoms, however, she has developed a severe acidosis, AKI, elevated H/H and worsening anion gap.  UA showed glucosuria and ketones.  Bicarb is 13, pH is 7.06.  Endotool was started in the ED.   Review of Systems: As per HPI otherwise all other systems reviewed and are negative.  Past Medical History:  Diagnosis Date   Chronic kidney disease, stage III (moderate) ([place]) 10/06/2017   Diabetes mellitus without complication (HCC)    GERD (gastroesophageal reflux disease)    Hypercholesteremia    Hypertension 09/10/2015   Sarcoidosis 10/12/2021   self-reported    Past Surgical History:  Procedure Laterality Date    APPENDECTOMY     CESAREAN SECTION  1991   COLONOSCOPY WITH PROPOFOL N/A 06/03/2019   Procedure: COLONOSCOPY WITH PROPOFOL;  Surgeon: Jonathon Bellows, MD;  Location: [facility] ENDOSCOPY;  Service: Gastroenterology;  Laterality: N/A;    Social History  reports that she quit smoking about 17 years ago. Her smoking use included cigarettes. She has a 5.00 pack-year smoking history. She has never used smokeless tobacco. She reports that she does not currently use alcohol after a past usage of about 14.0 standard drinks of alcohol per week. She reports that she does not use drugs.  Allergies  Allergen Reactions   Etodolac Rash   Penicillin G Rash   Penicillins Rash    Has patient had a PCN reaction causing immediate rash, facial/tongue/throat swelling, SOB or lightheadedness with hypotension: Yes Has patient had a PCN reaction causing severe rash involving mucus membranes or skin necrosis: No Has patient had a PCN reaction that required hospitalization: No Has patient had a PCN reaction occurring within the last 10 years: No If all of the above answers are "NO", then may proceed with Cephalosporin use.    Family History  Problem Relation Age of Onset   COPD Mother    Alzheimer's disease Mother    Hypertension Father    Hyperlipidemia Father    Heart attack Father    Diabetes type II Father  Lupus Father    Diabetes type II Sister    Diabetes type II Sister    Diabetes type II Sister    Diabetes type II Sister    Gestational diabetes Daughter     Prior to Admission medications   Medication Sig Start Date End Date Taking? Authorizing Provider  albuterol (PROVENTIL HFA) 108 (90 Base) MCG/ACT inhaler Inhale 2 puffs into the lungs every 6 (six) hours as needed for wheezing or shortness of breath. 11/15/21   Parrett, Virgel Bouquet, NP  cetirizine (ZYRTEC) 10 MG tablet Take 1 tablet (10 mg total) by mouth daily. 03/22/22     cloNIDine (CATAPRES) 0.1 MG tablet Take 1 tablet (0.1 mg total) by mouth 2  (two) times daily for hot flashes. 04/08/22     cyclobenzaprine (FLEXERIL) 10 MG tablet Take 1 tablet by mouth 2 (two) times daily as needed for muscle spasms. 01/04/22   [provider]  dapagliflozin propanediol (FARXIGA) 10 MG TABS tablet Take 1 tablet (10 mg total) by mouth daily. 04/08/22     DULoxetine (CYMBALTA) 30 MG capsule Take 2 capsules (60 mg total) by mouth 2 (two) times daily. 03/15/22   Iloabachie, Chioma E, NP  ferrous sulfate (FEROSUL) 325 (65 FE) MG tablet TAKE ONE TABLET BY MOUTH EVERY DAY WITH BREAKFAST. 12/14/21   Iloabachie, Chioma E, NP  ferrous sulfate (FEROSUL) 325 (65 FE) MG tablet Take 1 tablet (325 mg total) by mouth every morning. 03/22/22     Finerenone (KERENDIA) 10 MG TABS Take 1 tablet (10 mg) by mouth daily. 04/08/22     gabapentin (NEURONTIN) 400 MG capsule TAKE ONE CAPSULE BY MOUTH 2 TIMES A DAY AND TAKE 2 CAPSULES BY MOUTH AT BEDTIME Patient not taking: Reported on 04/05/2022 02/24/22   Iloabachie, Chioma E, NP  hydrochlorothiazide (MICROZIDE) 12.5 MG capsule Take 1 capsule (12.5 mg total) by mouth daily. 04/05/22   Charlsie Quest, NP  hydrOXYzine (ATARAX) 25 MG tablet Take 1 tablet (25 mg total) by mouth 2 (two) times daily as needed. 03/15/22   Iloabachie, Chioma E, NP  Insulin Glargine (BASAGLAR KWIKPEN) 100 UNIT/ML INJECT 17 UNITS UNDER THE SKIN ONCE DAILY AT BEDTIME. 02/02/22 06/04/22  Iloabachie, Chioma E, NP  insulin lispro (HUMALOG) 100 UNIT/ML injection Inject 0.02 mLs (2 Units total) into the skin 3 (three) times daily before meals. Sliding scale 121-150 = 2 units + 1 unit = 3 units,  151-200 = 2 units + 2 units = 4 units 201- 250 = 2 units + 3 units = 5 units 251 - 300 = 2 units + 4 units = 6 units 301- 350 = 2 units + 5 units = 7 units 351 - 400 = 2 units + 6 units = 8 units > 400 call clinic. 11/16/21   Iloabachie, Chioma E, NP  Insulin Pen Needle 32G X 6 MM MISC Use as directed. 04/13/21   Skipper Cliche, [facility]  Melatonin 10 MG TABS Take 1 tablet by  mouth at bedtime.    [provider]  metFORMIN (GLUCOPHAGE) 500 MG tablet TAKE ONE TABLET BY MOUTH 2 TIMES A DAY 03/15/22 03/15/23  Iloabachie, Chioma E, NP  metoCLOPramide (REGLAN) 10 MG tablet Take 1 tablet (10 mg total) by mouth every 6 (six) hours as needed for nausea or vomiting. 04/13/22 04/13/23  Phineas Semen, MD  metoprolol tartrate (LOPRESSOR) 25 MG tablet Take 2 tablets (50 mg total) by mouth 2 (two) times daily. 02/22/22 04/05/22  Furth, Cadence H, PA-C  montelukast (  SINGULAIR) 10 MG tablet Take 1 tablet (10 mg total) by mouth daily. 03/22/22     nitroGLYCERIN (NITROSTAT) 0.4 MG SL tablet DISSOLVE 1 TABLET UNDER TONGUE AS NEEDED FOR CHEST PAIN EVERY 5 MINUTES FOR MAX OF 3 DOSES IN 15 MINUTES. IF NO RELIEF AFTER FIRST DOSE CALL 911. 10/05/21 10/05/22  Iloabachie, Chioma E, NP  Omega-3 Fatty Acids (FISH OIL) 1000 MG CAPS Take 1,000 mg by mouth 2 (two) times daily.    [provider]  omeprazole (PRILOSEC) 20 MG capsule Take 1 capsule (20 mg total) by mouth 2 (two) times daily. 03/22/22     rosuvastatin (CRESTOR) 5 MG tablet TAKE ONE TABLET BY MOUTH ONCE DAILY 10/12/21   Iloabachie, Chioma E, NP  Semaglutide, 1 MG/DOSE, (OZEMPIC, 1 MG/DOSE,) 4 MG/3ML SOPN Inject 1 mg into the skin once a week. Discontinue Trulicity. 04/08/22     senna-docusate (SENOKOT-S) 8.6-50 MG tablet Take 1 tablet by mouth once daily. 07/13/21   Iloabachie, Chioma E, NP  traZODone (DESYREL) 150 MG tablet Take 1 tablet (150 mg total) by mouth once nightly at bedtime. 03/15/22   Caryl Asp E, NP    Physical Exam: Vitals:   04/16/22 1446 04/16/22 1447 04/16/22 1600 04/16/22 1630  BP: 134/84  138/82 (!) 155/86  Pulse: (!) 140   (!) 117  Resp: 20   16  Temp: 97.9 F (36.6 C)   98.2 F (36.8 C)  TempSrc: Oral   Oral  SpO2: 97%  99% 100%  Weight:  76.7 kg    Height:  5\' 2"  (1.575 m)      Constitutional: Lying in bed, retching, well developed Eyes: lids and conjunctivae normal ENMT: Dry MM Neck:  normal, supple Respiratory: CTAB, no wheezing Cardiovascular: Tachycardic, regular rhythm, no murmur noted, no pedal edema, she has bruising on right hand from previous admission to the ED>  Abdomen: +TTP in the epigastrium and RUQ, decreased bowel sounds, no distention Musculoskeletal: normal tone and bulk Skin: Bruising on right hand from previous blood draws, multiple healing cat scratches on left arm.  Otherwise, no rash on exposed skin Neurologic: CN 2-12 grossly intact. Strength intact, able to move easily in bed, speech is clear Psychiatric: Normal judgment and insight. Alert and oriented x 3. Appears fatigued.   Labs on Admission: I have personally reviewed following labs and imaging studies  CBC: Recent Labs  Lab 04/13/22 1747 04/16/22 1449  WBC 11.6* 10.1  HGB 14.7 15.4*  HCT 44.9 48.1*  MCV 84.2 86.2  PLT 270 [id number]    Basic Metabolic Panel: Recent Labs  Lab 04/13/22 2045 04/16/22 1449  NA 138 137  K 3.9 4.4  CL 104 102  CO2 18* 13*  GLUCOSE 144* 260*  BUN 26* 27*  CREATININE 1.35* 1.81*  CALCIUM 9.1 10.5*    GFR: Estimated Creatinine Clearance: 31.3 mL/min (A) (by C-G formula based on SCr of 1.81 mg/dL (H)).  Liver Function Tests: Recent Labs  Lab 04/13/22 2045  AST 16  ALT 19  ALKPHOS 55  BILITOT 1.2  PROT 6.7  ALBUMIN 3.6    Urine analysis:    Component Value Date/Time   COLORURINE YELLOW (A) 04/16/2022 1551   APPEARANCEUR CLEAR (A) 04/16/2022 1551   APPEARANCEUR Clear 12/28/2021 1440   LABSPEC 1.024 04/16/2022 1551   PHURINE 5.0 04/16/2022 1551   GLUCOSEU >=500 (A) 04/16/2022 1551   HGBUR NEGATIVE 04/16/2022 1551   BILIRUBINUR NEGATIVE 04/16/2022 1551   BILIRUBINUR Negative 12/28/2021 1440   KETONESUR 80 (  A) 04/16/2022 1551   PROTEINUR 30 (A) 04/16/2022 1551   UROBILINOGEN 0.2 11/16/2021 1021   NITRITE NEGATIVE 04/16/2022 1551   LEUKOCYTESUR NEGATIVE 04/16/2022 1551    Radiological Exams on Admission:  CT abdomen/pelvis from  8/16 IMPRESSION: No acute abnormality in the abdomen or pelvis.   Increased size of the retroperitoneal and porta hepatic lymph nodes in the upper abdomen. These remain nonspecific and may be related to the history of sarcoid. Differential considerations include metastatic adenopathy and lymphoproliferative disorder.   Aortic Atherosclerosis (ICD10-I70.0).     Electronically Signed   By: Minerva Fester M.D.   On: 04/13/2022 22:08  EKG: Independently reviewed. Sinus tachycardia at 140s  Assessment/Plan  DKA, type 2 (HCC) Abdominal pain Nausea/vomiting Anion Gap Metabolic acidosis - Endo tool - has had issues with IV access, may need central line, EDP Dr. Roxan Hockey to reassess for line placement - Insulin ggt - IVF to replace lost fluids - May require bicarb - Trend BMET for [employer], once closed X 2, can consider getting off insulin drip - Monitor in SDU - Hold SGLT2 permanently - Nausea medication with Zofran - Trend lactate - Check beta hydroxybutyrate - trend  Essential hypertension - Continue HCTZ if able to tolerate - PRN hydralazine for SBP > 180    Hyperlipidemia associated with type 2 diabetes mellitus (HCC) - Once able to take PO, restart home rosuvastatin    GERD (gastroesophageal reflux disease) - IV PPI while NPO, then protonix    AKI on Chronic kidney disease, stage III (moderate) (HCC) - IVF as per endotool, she will need fluids replaced - Trend - Monitor electrolytes with BMET q 4 hours    Generalized anxiety disorder - Continue hydroxyzine, duloxetine - If not able to take PO, consider IV benzodiazepine if needed    Sarcoidosis - Continue albuterol, zyrtec, singulair   DVT prophylaxis: Lovenox  Code Status:   Full  Disposition Plan:   Patient is from:  Home  Anticipated DC to:  [facility]me  Anticipated DC date:  04/19/22  Anticipated DC barriers: Improvement in acidosis  Consults called:  None  Admission status:  SDU, IP   Severity of Illness: The  appropriate patient status for this patient is INPATIENT. Inpatient status is judged to be reasonable and necessary in order to provide the required intensity of service to ensure the patient's safety. The patient's presenting symptoms, physical exam findings, and initial radiographic and laboratory data in the context of their chronic comorbidities is felt to place them at high risk for further clinical deterioration. Furthermore, it is not anticipated that the patient will be medically stable for discharge from the hospital within 2 midnights of admission.   * I certify that at the point of admission it is my clinical judgment that the patient will require inpatient hospital care spanning beyond 2 midnights from the point of admission due to high intensity of service, high risk for further deterioration and high frequency of surveillance required.Debe Coder MD [facility]  How to contact the [facility] Attending or Consulting provider 7A - 7P or covering provider during after hours 7P -7A, for this patient?   Check the care team in [facility] and look for a) attending/consulting TR[facility] provider listed and b) the [facility] team listed Log into [email or web address] and use [place]'s universal password to access. If you do not have the password, please contact the hospital operator. Locate the [facility] provider you are looking for under [facility] and page  to a number that you can be directly reached. If you still have difficulty reaching the provider, please page the [facility] (Director on Call) for the Hospitalists listed on amion for assistance.  04/16/2022, 5:37 PM

## 2022-04-16 NOTE — ED Notes (Signed)
PH 7.05, CO2 42, BE -18.4, BICARB 11.6

## 2022-04-16 NOTE — Progress Notes (Signed)
A consult was placed to the IV Therapist, requesting 2 more iv sites due to incompatible fluids/medications;  current NSL near R [facility] not currently in use; RN stated "she's gotten 2 L of fluids already; both arms assessed thoroughly with ultrasound but no suitable veins noted;  RN aware.

## 2022-04-16 NOTE — ED Provider Notes (Signed)
Due to difficulty with obtaining IV access, a 20G peripheral IV catheter was inserted using US guidance into the RUE.  The site was prepped with chlorhexidine and allowed to dry.  The patient tolerated the procedure without any complications.    Willy Eddy, MD 04/16/22 1921

## 2022-04-16 NOTE — ED Triage Notes (Signed)
Pt reports has been constantly vomiting. Pt reports was seen here for the same on Wednesday but they didn't really tell her anything other than a virus. Pt reports abd pain when she vomits. Denies CP, SOB, dizziness or diarrhea

## 2022-04-17 DIAGNOSIS — E1110 Type 2 diabetes mellitus with ketoacidosis without coma: Secondary | ICD-10-CM

## 2022-04-17 DIAGNOSIS — R1084 Generalized abdominal pain: Secondary | ICD-10-CM | POA: Diagnosis not present

## 2022-04-17 DIAGNOSIS — K219 Gastro-esophageal reflux disease without esophagitis: Secondary | ICD-10-CM

## 2022-04-17 DIAGNOSIS — E1169 Type 2 diabetes mellitus with other specified complication: Secondary | ICD-10-CM

## 2022-04-17 DIAGNOSIS — E876 Hypokalemia: Secondary | ICD-10-CM

## 2022-04-17 DIAGNOSIS — N1831 Chronic kidney disease, stage 3a: Secondary | ICD-10-CM | POA: Diagnosis not present

## 2022-04-17 DIAGNOSIS — I10 Essential (primary) hypertension: Secondary | ICD-10-CM

## 2022-04-17 DIAGNOSIS — F411 Generalized anxiety disorder: Secondary | ICD-10-CM

## 2022-04-17 DIAGNOSIS — E785 Hyperlipidemia, unspecified: Secondary | ICD-10-CM

## 2022-04-17 DIAGNOSIS — D869 Sarcoidosis, unspecified: Secondary | ICD-10-CM

## 2022-04-17 HISTORY — DX: Hypokalemia: E87.6

## 2022-04-17 LAB — BASIC METABOLIC PANEL
Anion gap: 12 (ref 5–15)
Anion gap: 5 (ref 5–15)
Anion gap: 6 (ref 5–15)
Anion gap: 7 (ref 5–15)
Anion gap: 7 (ref 5–15)
Anion gap: 8 (ref 5–15)
BUN: 11 mg/dL (ref 8–23)
BUN: 13 mg/dL (ref 8–23)
BUN: 14 mg/dL (ref 8–23)
BUN: 16 mg/dL (ref 8–23)
BUN: 22 mg/dL (ref 8–23)
BUN: 9 mg/dL (ref 8–23)
CO2: 14 mmol/L — ABNORMAL LOW (ref 22–32)
CO2: 18 mmol/L — ABNORMAL LOW (ref 22–32)
CO2: 18 mmol/L — ABNORMAL LOW (ref 22–32)
CO2: 20 mmol/L — ABNORMAL LOW (ref 22–32)
CO2: 21 mmol/L — ABNORMAL LOW (ref 22–32)
CO2: 23 mmol/L (ref 22–32)
Calcium: 8.6 mg/dL — ABNORMAL LOW (ref 8.9–10.3)
Calcium: 8.8 mg/dL — ABNORMAL LOW (ref 8.9–10.3)
Calcium: 8.9 mg/dL (ref 8.9–10.3)
Calcium: 8.9 mg/dL (ref 8.9–10.3)
Calcium: 9 mg/dL (ref 8.9–10.3)
Calcium: 9.1 mg/dL (ref 8.9–10.3)
Chloride: 110 mmol/L (ref 98–111)
Chloride: 111 mmol/L (ref 98–111)
Chloride: 113 mmol/L — ABNORMAL HIGH (ref 98–111)
Chloride: 114 mmol/L — ABNORMAL HIGH (ref 98–111)
Chloride: 114 mmol/L — ABNORMAL HIGH (ref 98–111)
Chloride: 116 mmol/L — ABNORMAL HIGH (ref 98–111)
Creatinine, Ser: 0.89 mg/dL (ref 0.44–1.00)
Creatinine, Ser: 0.92 mg/dL (ref 0.44–1.00)
Creatinine, Ser: 0.99 mg/dL (ref 0.44–1.00)
Creatinine, Ser: 1.01 mg/dL — ABNORMAL HIGH (ref 0.44–1.00)
Creatinine, Ser: 1.08 mg/dL — ABNORMAL HIGH (ref 0.44–1.00)
Creatinine, Ser: 1.28 mg/dL — ABNORMAL HIGH (ref 0.44–1.00)
GFR, Estimated: 48 mL/min — ABNORMAL LOW (ref >60)
GFR, Estimated: 58 mL/min — ABNORMAL LOW (ref >60)
GFR, Estimated: >60 mL/min (ref >60)
GFR, Estimated: >60 mL/min (ref >60)
GFR, Estimated: >60 mL/min (ref >60)
GFR, Estimated: >60 mL/min (ref >60)
Glucose, Bld: 145 mg/dL — ABNORMAL HIGH (ref 70–99)
Glucose, Bld: 147 mg/dL — ABNORMAL HIGH (ref 70–99)
Glucose, Bld: 153 mg/dL — ABNORMAL HIGH (ref 70–99)
Glucose, Bld: 164 mg/dL — ABNORMAL HIGH (ref 70–99)
Glucose, Bld: 171 mg/dL — ABNORMAL HIGH (ref 70–99)
Glucose, Bld: 172 mg/dL — ABNORMAL HIGH (ref 70–99)
Potassium: 3.2 mmol/L — ABNORMAL LOW (ref 3.5–5.1)
Potassium: 3.3 mmol/L — ABNORMAL LOW (ref 3.5–5.1)
Potassium: 3.5 mmol/L (ref 3.5–5.1)
Potassium: 3.7 mmol/L (ref 3.5–5.1)
Potassium: 3.8 mmol/L (ref 3.5–5.1)
Potassium: 3.8 mmol/L (ref 3.5–5.1)
Sodium: 138 mmol/L (ref 135–145)
Sodium: 139 mmol/L (ref 135–145)
Sodium: 139 mmol/L (ref 135–145)
Sodium: 140 mmol/L (ref 135–145)
Sodium: 140 mmol/L (ref 135–145)
Sodium: 141 mmol/L (ref 135–145)

## 2022-04-17 LAB — GLUCOSE, CAPILLARY: Glucose-Capillary: 122 mg/dL — ABNORMAL HIGH (ref 70–99)

## 2022-04-17 LAB — HEMOGLOBIN A1C
Hgb A1c MFr Bld: 7.8 % — ABNORMAL HIGH (ref 4.8–5.6)
Mean Plasma Glucose: 177.16 mg/dL

## 2022-04-17 LAB — CBG MONITORING, ED
Glucose-Capillary: 141 mg/dL — ABNORMAL HIGH (ref 70–99)
Glucose-Capillary: 145 mg/dL — ABNORMAL HIGH (ref 70–99)
Glucose-Capillary: 153 mg/dL — ABNORMAL HIGH (ref 70–99)
Glucose-Capillary: 167 mg/dL — ABNORMAL HIGH (ref 70–99)
Glucose-Capillary: 175 mg/dL — ABNORMAL HIGH (ref 70–99)

## 2022-04-17 LAB — BLOOD GAS, VENOUS
Acid-base deficit: 8.5 mmol/L — ABNORMAL HIGH (ref 0.0–2.0)
Bicarbonate: 16.5 mmol/L — ABNORMAL LOW (ref 20.0–28.0)
O2 Saturation: 96.6 %
Patient temperature: 37
pCO2, Ven: 32 mmHg — ABNORMAL LOW (ref 44–60)
pH, Ven: 7.32 (ref 7.25–7.43)
pO2, Ven: 74 mmHg — ABNORMAL HIGH (ref 32–45)

## 2022-04-17 LAB — BETA-HYDROXYBUTYRIC ACID
Beta-Hydroxybutyric Acid: 4.99 mmol/L — ABNORMAL HIGH (ref 0.05–0.27)
Beta-Hydroxybutyric Acid: >8 mmol/L — ABNORMAL HIGH (ref 0.05–0.27)
Beta-Hydroxybutyric Acid: 2.23 mmol/L — ABNORMAL HIGH (ref 0.05–0.27)
Beta-Hydroxybutyric Acid: 3.24 mmol/L — ABNORMAL HIGH (ref 0.05–0.27)

## 2022-04-17 LAB — CBC
HCT: 34.2 % — ABNORMAL LOW (ref 36.0–46.0)
Hemoglobin: 11.5 g/dL — ABNORMAL LOW (ref 12.0–15.0)
MCH: 28.2 pg (ref 26.0–34.0)
MCHC: 33.6 g/dL (ref 30.0–36.0)
MCV: 83.8 fL (ref 80.0–100.0)
Platelets: 239 10*3/uL (ref 150–400)
RBC: 4.08 MIL/uL (ref 3.87–5.11)
RDW: 14.4 % (ref 11.5–15.5)
WBC: 12.1 10*3/uL — ABNORMAL HIGH (ref 4.0–10.5)
nRBC: 0 % (ref 0.0–0.2)

## 2022-04-17 LAB — MRSA NEXT GEN BY PCR, NASAL: MRSA by PCR Next Gen: NOT DETECTED

## 2022-04-17 LAB — HIV ANTIBODY (ROUTINE TESTING W REFLEX): HIV Screen 4th Generation wRfx: NONREACTIVE

## 2022-04-17 LAB — PHOSPHORUS: Phosphorus: 1.8 mg/dL — ABNORMAL LOW (ref 2.5–4.6)

## 2022-04-17 LAB — LACTIC ACID, PLASMA: Lactic Acid, Venous: 1.3 mmol/L (ref 0.5–1.9)

## 2022-04-17 LAB — MAGNESIUM: Magnesium: 1.2 mg/dL — ABNORMAL LOW (ref 1.7–2.4)

## 2022-04-17 MED ORDER — METOPROLOL TARTRATE 5 MG/5ML IV SOLN
10.0000 mg | Freq: Four times a day (QID) | INTRAVENOUS | Status: DC
  Administered 2022-04-17: 10 mg via INTRAVENOUS
  Filled 2022-04-17: qty 10

## 2022-04-17 MED ORDER — INSULIN ASPART 100 UNIT/ML IJ SOLN
0.0000 [IU] | Freq: Three times a day (TID) | INTRAMUSCULAR | Status: DC
  Administered 2022-04-18: 2 [IU] via SUBCUTANEOUS
  Administered 2022-04-18: 3 [IU] via SUBCUTANEOUS
  Administered 2022-04-18: 2 [IU] via SUBCUTANEOUS
  Administered 2022-04-19: 5 [IU] via SUBCUTANEOUS
  Administered 2022-04-19: 2 [IU] via SUBCUTANEOUS
  Filled 2022-04-17 (×5): qty 1

## 2022-04-17 MED ORDER — INSULIN GLARGINE-YFGN 100 UNIT/ML ~~LOC~~ SOLN
13.0000 [IU] | Freq: Every day | SUBCUTANEOUS | Status: DC
  Administered 2022-04-17 – 2022-04-19 (×3): 13 [IU] via SUBCUTANEOUS
  Filled 2022-04-17 (×3): qty 0.13

## 2022-04-17 MED ORDER — MAGNESIUM SULFATE 2 GM/50ML IV SOLN
2.0000 g | Freq: Once | INTRAVENOUS | Status: AC
Start: 2022-04-17 — End: 2022-04-17
  Administered 2022-04-17: 2 g via INTRAVENOUS
  Filled 2022-04-17: qty 50

## 2022-04-17 MED ORDER — POTASSIUM CHLORIDE CRYS ER 20 MEQ PO TBCR
40.0000 meq | EXTENDED_RELEASE_TABLET | Freq: Once | ORAL | Status: DC
  Filled 2022-04-17: qty 2

## 2022-04-17 MED ORDER — PROMETHAZINE HCL 25 MG PO TABS
25.0000 mg | ORAL_TABLET | Freq: Once | ORAL | Status: AC
  Administered 2022-04-17: 25 mg via ORAL
  Filled 2022-04-17: qty 1

## 2022-04-17 MED ORDER — LACTATED RINGERS IV BOLUS
2000.0000 mL | Freq: Once | INTRAVENOUS | Status: AC
Start: 2022-04-17 — End: 2022-04-17
  Administered 2022-04-17: 2000 mL via INTRAVENOUS

## 2022-04-17 MED ORDER — K PHOS MONO-SOD PHOS DI & MONO 155-852-130 MG PO TABS
500.0000 mg | ORAL_TABLET | Freq: Every day | ORAL | Status: DC
  Administered 2022-04-18: 500 mg via ORAL
  Filled 2022-04-17 (×2): qty 2

## 2022-04-17 MED ORDER — CHLORHEXIDINE GLUCONATE CLOTH 2 % EX PADS
6.0000 | MEDICATED_PAD | Freq: Every day | CUTANEOUS | Status: DC
  Administered 2022-04-17 – 2022-04-19 (×2): 6 via TOPICAL

## 2022-04-17 MED ORDER — POTASSIUM CHLORIDE 10 MEQ/100ML IV SOLN
10.0000 meq | INTRAVENOUS | Status: AC
  Administered 2022-04-17 (×4): 10 meq via INTRAVENOUS
  Filled 2022-04-17 (×4): qty 100

## 2022-04-17 MED ORDER — METOPROLOL TARTRATE 5 MG/5ML IV SOLN
10.0000 mg | INTRAVENOUS | Status: DC | PRN
  Administered 2022-04-18: 5 mg via INTRAVENOUS
  Filled 2022-04-17: qty 10

## 2022-04-17 MED ORDER — ALUM & MAG HYDROXIDE-SIMETH 200-200-20 MG/5ML PO SUSP
15.0000 mL | Freq: Four times a day (QID) | ORAL | Status: DC | PRN
Start: 2022-04-17 — End: 2022-04-19
  Administered 2022-04-17: 15 mL via ORAL
  Filled 2022-04-17: qty 30

## 2022-04-17 MED ORDER — SODIUM CHLORIDE 0.45 % IV SOLN
INTRAVENOUS | Status: DC
  Filled 2022-04-17 (×2): qty 75

## 2022-04-17 MED ORDER — POTASSIUM CHLORIDE 10 MEQ/100ML IV SOLN
10.0000 meq | INTRAVENOUS | Status: AC
  Administered 2022-04-17 (×2): 10 meq via INTRAVENOUS

## 2022-04-17 MED ORDER — INSULIN ASPART 100 UNIT/ML IJ SOLN: 0.0000 [IU] | Freq: Every day | INTRAMUSCULAR | Status: DC

## 2022-04-17 MED ORDER — ACETAMINOPHEN 325 MG PO TABS
650.0000 mg | ORAL_TABLET | Freq: Four times a day (QID) | ORAL | Status: DC | PRN
Start: 2022-04-17 — End: 2022-04-19
  Administered 2022-04-17 – 2022-04-19 (×3): 650 mg via ORAL
  Filled 2022-04-17 (×3): qty 2

## 2022-04-17 NOTE — Progress Notes (Signed)
Called to get report from the ED, nurse to return call.

## 2022-04-17 NOTE — ED Notes (Signed)
Advised nurse that patient has ready bed 

## 2022-04-17 NOTE — Progress Notes (Signed)
Per Dr. Marylu Lund continue D5LR at with blood sugars less than 200.

## 2022-04-17 NOTE — Progress Notes (Signed)
Patient's heart rate sustained in the 130s, no c/o chest pain or SOB, Dr. Marylu Lund notified and ordered metoprolol prn IV.

## 2022-04-17 NOTE — Progress Notes (Signed)
Received patient from ED who is alert and oriented x 4, placed on cardiac monitoring, insulin gtt is currently running, and CHG bath completed.  Patient oriented to room and call light.

## 2022-04-17 NOTE — Progress Notes (Signed)
Patient c/o nausea, pain to the right and left leg, and indigestion.  Dr. Marylu Lund notified and placed orders for phenergan, tylenol, and maalox.

## 2022-04-17 NOTE — Plan of Care (Signed)
Continuing with plan of care. 

## 2022-04-17 NOTE — Progress Notes (Signed)
PROGRESS NOTE    Renee Bush  [id number] DOB: May 21, 1961 DOA: 04/16/2022 PCP: Renee Claude, FNP    Brief Narrative:  Renee Bush is a 61 y.o. female with medical history significant of CKD, DM2, GERD, HLD, HTN, sarcoidosis who presents for 1.5 weeks of nausea and vomiting.  [name] reports persistent nausea, vomiting for about 10 days.  She notes no preceding event, no sick contact, no change in food.  She has not been able to keep food down in that time.  She has mostly kept fluid and bills down.  She presented to the ED on 8/16 and it was felt she had a GI illness.  She notes worsening abdominal pain with vomiting in the epigastric area and development of chest pain when she vomits.  She has noted only greenish fluid, no blood in the emesis.   She is on an SGLT2 for her DM, but she does not remember the name.  She notes recently being started on [place] and Ozempic but not starting either as of yet.  No one else is sick in the family.  She has not had any diarrhea.  When in the ED on 8/16, she felt better with IVF and nausea medication and was discharged home.   ED Course: Today, she notes similar symptoms, however, she has developed a severe acidosis, AKI, elevated H/H and worsening anion gap.  UA showed glucosuria and ketones.  Bicarb is 13, pH is 7.06.  Endotool was started in the ED.    Consultants:    Procedures:   Antimicrobials:      Subjective: "Does not feel good ".  Unable to tell me why she does not feel good.  Denies shortness of breath or chest pain.  Denies abdominal pain.  Objective: Vitals:   04/17/22 1027 04/17/22 1100 04/17/22 1200 04/17/22 1300  BP:  (!) 153/66 (!) 146/82 (!) 155/86  Pulse: (!) 134 (!) 132 (!) 109 (!) 109  Resp: 13 12 12 14   Temp:   98.1 F (36.7 C)   TempSrc:   Oral   SpO2: 97% 99% 98% 100%  Weight:      Height:        Intake/Output Summary (Last 24 hours) at 04/17/2022 1359 Last data filed at 04/17/2022  1353 Gross per 24 hour  Intake 2322.06 ml  Output 550 ml  Net 1772.06 ml   Filed Weights   04/16/22 1447 04/17/22 0843  Weight: 76.7 kg 77.3 kg    Examination: Calm, NAD Cta no w/r Reg s1/s2 no gallop Soft benign +bs No edema Awake, alert Mood and affect appropriate in current setting    Data Reviewed: I have personally reviewed following labs and imaging studies  CBC: Recent Labs  Lab 04/13/22 1747 04/16/22 1449 04/17/22 0435  WBC 11.6* 10.1 12.1*  HGB 14.7 15.4* 11.5*  HCT 44.9 48.1* 34.2*  MCV 84.2 86.2 83.8  PLT 270 396 [id number]   Basic Metabolic Panel: Recent Labs  Lab 04/16/22 2120 04/17/22 0031 04/17/22 0435 04/17/22 0803 04/17/22 1136  NA 139 140 138 141 139  K 4.0 3.7 3.8 3.3* 3.2*  CL 109 114* 114* 116* 113*  CO2 9* 14* 18* 20* 18*  GLUCOSE 244* 171* 147* 145* 164*  BUN 24* 22 16 14 13   CREATININE 1.46* 1.28* 0.99 1.08* 0.92  CALCIUM 9.4 8.9 8.6* 8.9 9.1  MG  --   --  1.2*  --   --   PHOS  --   --  1.8*  --   --    GFR: Estimated Creatinine Clearance: 61.8 mL/min (by C-G formula based on SCr of 0.92 mg/dL). Liver Function Tests: Recent Labs  Lab 04/13/22 2045 04/16/22 2120  AST 16 17  ALT 19 17  ALKPHOS 55 59  BILITOT 1.2 1.8*  PROT 6.7 7.3  ALBUMIN 3.6 3.7   Recent Labs  Lab 04/13/22 2045  LIPASE 75*   No results for input(s): "AMMONIA" in the last 168 hours. Coagulation Profile: No results for input(s): "INR", "PROTIME" in the last 168 hours. Cardiac Enzymes: No results for input(s): "CKTOTAL", "CKMB", "CKMBINDEX", "TROPONINI" in the last 168 hours. BNP (last 3 results) No results for input(s): "PROBNP" in the last 8760 hours. HbA1C: No results for input(s): "HGBA1C" in the last 72 hours. CBG: Recent Labs  Lab 04/17/22 0133 04/17/22 0223 04/17/22 0430 04/17/22 0641 04/17/22 0847  GLUCAP 167* 141* 145* 153* 122*   Lipid Profile: No results for input(s): "CHOL", "HDL", "LDLCALC", "TRIG", "CHOLHDL", "LDLDIRECT" in the  last 72 hours. Thyroid Function Tests: No results for input(s): "TSH", "T4TOTAL", "FREET4", "T3FREE", "THYROIDAB" in the last 72 hours. Anemia Panel: No results for input(s): "VITAMINB12", "FOLATE", "FERRITIN", "TIBC", "IRON", "RETICCTPCT" in the last 72 hours. Sepsis Labs: Recent Labs  Lab 04/16/22 1550 04/16/22 2120 04/17/22 1136  LATICACIDVEN 2.6* 2.3* 1.3    Recent Results (from the past 240 hour(s))  MRSA Next Gen by PCR, Nasal     Status: None   Collection Time: 04/17/22  8:44 AM   Specimen: Nasal Mucosa; Nasal Swab  Result Value Ref Range Status   MRSA by PCR Next Gen NOT DETECTED NOT DETECTED Final    Comment: (NOTE) The GeneXpert MRSA Assay (FDA approved for NASAL specimens only), is one component of a comprehensive MRSA colonization surveillance program. It is not intended to diagnose MRSA infection nor to guide or monitor treatment for MRSA infections. Test performance is not FDA approved in patients less than 14 years old. Performed at [facility], [address] [place], [place] [address]          Radiology Studies: No results found.      Scheduled Meds:  Chlorhexidine Gluconate Cloth  6 each Topical Daily   DULoxetine  60 mg Oral Daily   enoxaparin (LOVENOX) injection  40 mg Subcutaneous Q24H   ferrous sulfate  325 mg Oral q morning   loratadine  10 mg Oral Daily   melatonin  2.5 mg Oral QHS   metoprolol tartrate  10 mg Intravenous Q6H   montelukast  10 mg Oral Daily   pantoprazole (PROTONIX) IV  40 mg Intravenous Q24H   phosphorus  500 mg Oral Daily   rosuvastatin  5 mg Oral Daily   senna-docusate  1 tablet Oral Daily   sodium chloride flush  3 mL Intravenous Q12H   traZODone  150 mg Oral QHS   Continuous Infusions:  dextrose 5% lactated ringers 125 mL/hr at 04/17/22 1353   insulin 0.4 Units/hr (04/17/22 1353)   lactated ringers     lactated ringers     lactated ringers     potassium chloride     sodium bicarbonate 75 mEq in  sodium chloride 0.45 % 1,075 mL infusion 50 mL/hr at 04/17/22 1353   sodium chloride Stopped (04/16/22 1430)    Assessment & Plan:   Principal Problem:   DKA, type 2 (HCC) Active Problems:   Essential hypertension   Hyperlipidemia associated with type 2 diabetes mellitus (HCC)  GERD (gastroesophageal reflux disease)   Chronic kidney disease, stage III (moderate) (HCC)   Abdominal pain   Generalized anxiety disorder   Sarcoidosis   Hypokalemia   DKA, type 2 (HCC) Abdominal pain Nausea/vomiting Anion Gap Metabolic acidosis - Endo tool - has had issues with IV access, may need central line, EDP Dr. Roxan Hockey to reassess for line placement - Insulin ggt - IVF to replace lost fluids - May require bicarb - Trend BMET for AG, once closed X 2, can consider getting off insulin drip - Monitor in SDU - Hold SGLT2 permanently 8/20 beta hydroxybutyrate acid improving A1c pending Metabolic acidosis improving slowly Start bicarb gtt   Essential hypertension Also with sinus tachy. Will do beta blk iv    Hyperlipidemia associated with type 2 diabetes mellitus (HCC) Resume statin when able to tolerate po  Hypokalemia Replaced with iv kcl    GERD (gastroesophageal reflux disease) - IV PPI while NPO, then protonix    AKI on Chronic kidney disease, stage III (moderate) (HCC) - IVF as per endotool, she will need fluids replaced - Trend - Monitor electrolytes with BMET q 4 hours    Generalized anxiety disorder - Continue hydroxyzine, duloxetine - If not able to take PO, consider IV benzodiazepine if needed    Sarcoidosis - Continue albuterol, zyrtec, singulair     DVT prophylaxis: Lovenox Code Status: Full code Family Communication: None at bedside  disposition Plan:  Status is: Inpatient Remains inpatient appropriate because: IV treatment.  Electrolyte imbalance.        LOS: 1 day   Time spent: 35 minutes    Lynn Ito, MD [facility] Pager [phone]  [phone]  If 7PM-7AM, please contact night-coverage 04/17/2022, 1:59 PM

## 2022-04-17 NOTE — Progress Notes (Signed)
Per Dr. Marylu Lund metoprolol IV is to be prn and not scheduled.

## 2022-04-18 DIAGNOSIS — E1110 Type 2 diabetes mellitus with ketoacidosis without coma: Secondary | ICD-10-CM | POA: Diagnosis not present

## 2022-04-18 DIAGNOSIS — N1831 Chronic kidney disease, stage 3a: Secondary | ICD-10-CM | POA: Diagnosis not present

## 2022-04-18 DIAGNOSIS — I10 Essential (primary) hypertension: Secondary | ICD-10-CM | POA: Diagnosis not present

## 2022-04-18 DIAGNOSIS — R1084 Generalized abdominal pain: Secondary | ICD-10-CM | POA: Diagnosis not present

## 2022-04-18 LAB — CBC
HCT: 31.1 % — ABNORMAL LOW (ref 36.0–46.0)
Hemoglobin: 10.6 g/dL — ABNORMAL LOW (ref 12.0–15.0)
MCH: 28.3 pg (ref 26.0–34.0)
MCHC: 34.1 g/dL (ref 30.0–36.0)
MCV: 83.2 fL (ref 80.0–100.0)
Platelets: 166 10*3/uL (ref 150–400)
RBC: 3.74 MIL/uL — ABNORMAL LOW (ref 3.87–5.11)
RDW: 14.8 % (ref 11.5–15.5)
WBC: 4.5 10*3/uL (ref 4.0–10.5)
nRBC: 0 % (ref 0.0–0.2)

## 2022-04-18 LAB — BASIC METABOLIC PANEL
Anion gap: 3 — ABNORMAL LOW (ref 5–15)
Anion gap: 5 (ref 5–15)
Anion gap: 6 (ref 5–15)
BUN: 6 mg/dL — ABNORMAL LOW (ref 8–23)
BUN: 7 mg/dL — ABNORMAL LOW (ref 8–23)
BUN: 7 mg/dL — ABNORMAL LOW (ref 8–23)
CO2: 23 mmol/L (ref 22–32)
CO2: 25 mmol/L (ref 22–32)
CO2: 25 mmol/L (ref 22–32)
Calcium: 8.6 mg/dL — ABNORMAL LOW (ref 8.9–10.3)
Calcium: 8.6 mg/dL — ABNORMAL LOW (ref 8.9–10.3)
Calcium: 8.6 mg/dL — ABNORMAL LOW (ref 8.9–10.3)
Chloride: 112 mmol/L — ABNORMAL HIGH (ref 98–111)
Chloride: 112 mmol/L — ABNORMAL HIGH (ref 98–111)
Chloride: 113 mmol/L — ABNORMAL HIGH (ref 98–111)
Creatinine, Ser: 0.91 mg/dL (ref 0.44–1.00)
Creatinine, Ser: 0.95 mg/dL (ref 0.44–1.00)
Creatinine, Ser: 0.96 mg/dL (ref 0.44–1.00)
GFR, Estimated: >60 mL/min (ref >60)
GFR, Estimated: >60 mL/min (ref >60)
GFR, Estimated: >60 mL/min (ref >60)
Glucose, Bld: 176 mg/dL — ABNORMAL HIGH (ref 70–99)
Glucose, Bld: 184 mg/dL — ABNORMAL HIGH (ref 70–99)
Glucose, Bld: 211 mg/dL — ABNORMAL HIGH (ref 70–99)
Potassium: 3.6 mmol/L (ref 3.5–5.1)
Potassium: 3.6 mmol/L (ref 3.5–5.1)
Potassium: 3.6 mmol/L (ref 3.5–5.1)
Sodium: 141 mmol/L (ref 135–145)
Sodium: 141 mmol/L (ref 135–145)
Sodium: 142 mmol/L (ref 135–145)

## 2022-04-18 LAB — BETA-HYDROXYBUTYRIC ACID: Beta-Hydroxybutyric Acid: 1.4 mmol/L — ABNORMAL HIGH (ref 0.05–0.27)

## 2022-04-18 LAB — GLUCOSE, CAPILLARY
Glucose-Capillary: 131 mg/dL — ABNORMAL HIGH (ref 70–99)
Glucose-Capillary: 168 mg/dL — ABNORMAL HIGH (ref 70–99)
Glucose-Capillary: 154 mg/dL — ABNORMAL HIGH (ref 70–99)
Glucose-Capillary: 116 mg/dL — ABNORMAL HIGH (ref 70–99)
Glucose-Capillary: 153 mg/dL — ABNORMAL HIGH (ref 70–99)
Glucose-Capillary: 138 mg/dL — ABNORMAL HIGH (ref 70–99)
Glucose-Capillary: 138 mg/dL — ABNORMAL HIGH (ref 70–99)
Glucose-Capillary: 134 mg/dL — ABNORMAL HIGH (ref 70–99)
Glucose-Capillary: 177 mg/dL — ABNORMAL HIGH (ref 70–99)
Glucose-Capillary: 166 mg/dL — ABNORMAL HIGH (ref 70–99)
Glucose-Capillary: 130 mg/dL — ABNORMAL HIGH (ref 70–99)
Glucose-Capillary: 130 mg/dL — ABNORMAL HIGH (ref 70–99)
Glucose-Capillary: 200 mg/dL — ABNORMAL HIGH (ref 70–99)

## 2022-04-18 MED ORDER — PROMETHAZINE HCL 25 MG PO TABS
25.0000 mg | ORAL_TABLET | Freq: Three times a day (TID) | ORAL | Status: DC | PRN
  Administered 2022-04-18: 25 mg via ORAL
  Filled 2022-04-18 (×2): qty 1

## 2022-04-18 MED ORDER — METOPROLOL TARTRATE 25 MG PO TABS
25.0000 mg | ORAL_TABLET | Freq: Two times a day (BID) | ORAL | Status: DC
  Administered 2022-04-18 – 2022-04-19 (×2): 25 mg via ORAL
  Filled 2022-04-18 (×2): qty 1

## 2022-04-18 MED ORDER — GABAPENTIN 400 MG PO CAPS: 400.0000 mg | ORAL_CAPSULE | Freq: Two times a day (BID) | ORAL | Status: DC

## 2022-04-18 MED ORDER — LACTATED RINGERS IV SOLN: INTRAVENOUS | Status: DC

## 2022-04-18 MED ORDER — GABAPENTIN 400 MG PO CAPS
400.0000 mg | ORAL_CAPSULE | Freq: Two times a day (BID) | ORAL | Status: DC
Start: 2022-04-19 — End: 2022-04-19
  Administered 2022-04-19: 400 mg via ORAL
  Filled 2022-04-18: qty 1

## 2022-04-18 MED ORDER — GABAPENTIN 400 MG PO CAPS
800.0000 mg | ORAL_CAPSULE | Freq: Every day | ORAL | Status: DC
Start: 2022-04-18 — End: 2022-04-19
  Administered 2022-04-18: 800 mg via ORAL
  Filled 2022-04-18: qty 2

## 2022-04-18 NOTE — Plan of Care (Signed)

## 2022-04-18 NOTE — Progress Notes (Signed)
PROGRESS NOTE    Renee Bush  EXB:[id number] DOB: 1961/06/18 DOA: 04/16/2022 PCP: Renee Kins, FNP    Brief Narrative:  Renee Bush is a 61 y.o. female with medical history significant of CKD, DM2, GERD, HLD, HTN, sarcoidosis who presents for 1.5 weeks of nausea and vomiting.  Renee Bush reports persistent nausea, vomiting for about 10 days.  She notes no preceding event, no sick contact, no change in food.  She has not been able to keep food down in that time.  She has mostly kept fluid and bills down.  She presented to the ED on 8/16 and it was felt she had a GI illness.  She notes worsening abdominal pain with vomiting in the epigastric area and development of chest pain when she vomits.  She has noted only greenish fluid, no blood in the emesis.   She is on an SGLT2 for her DM, but she does not remember the name.  She notes recently being started on [place] and Ozempic but not starting either as of yet.  No one else is sick in the family.  She has not had any diarrhea.  When in the ED on 8/16, she felt better with IVF and nausea medication and was discharged home.    ED Course: Today, she notes similar symptoms, however, she has developed a severe acidosis, AKI, elevated H/H and worsening anion gap.  UA showed glucosuria and ketones.  Bicarb is 13, pH is 7.06.  Endotool was started in the ED.    Consultants:    Procedures:   Antimicrobials:      Subjective: Feels better, no sob, cp, abd pain   Objective: Vitals:   04/18/22 0500 04/18/22 0600 04/18/22 0800 04/18/22 1200  BP: 100/63 138/72 109/70 (!) 167/80  Pulse: (!) 109 (!) 105 (!) 107 (!) 107  Resp: 15 14 16 11   Temp:  99.1 F (37.3 C) 98.5 F (36.9 C) 98.4 F (36.9 C)  TempSrc:  Oral Oral Oral  SpO2: 92% 95% 92% 96%  Weight:      Height:        Intake/Output Summary (Last 24 hours) at 04/18/2022 1545 Last data filed at 04/18/2022 1200 Gross per 24 hour  Intake 4091.09 ml  Output 1835 ml   Net 2256.09 ml   Filed Weights   04/16/22 1447 04/17/22 0843  Weight: 76.7 kg 77.3 kg    Examination: Calm, NAD Cta no w/r Reg s1/s2 no gallop Soft benign +bs No edema Aaoxox3  Mood and affect appropriate in current setting     Data Reviewed: I have personally reviewed following labs and imaging studies  CBC: Recent Labs  Lab 04/13/22 1747 04/16/22 1449 04/17/22 0435 04/18/22 1137  WBC 11.6* 10.1 12.1* 4.5  HGB 14.7 15.4* 11.5* 10.6*  HCT 44.9 48.1* 34.2* 31.1*  MCV 84.2 86.2 83.8 83.2  PLT 270 396 239 166   Basic Metabolic Panel: Recent Labs  Lab 04/17/22 0435 04/17/22 0803 04/17/22 1523 04/17/22 1946 04/18/22 0039 04/18/22 0424 04/18/22 0745  NA 138   < > 139 140 141 142 141  K 3.8   < > 3.5 3.8 3.6 3.6 3.6  CL 114*   < > 111 110 112* 112* 113*  CO2 18*   < > 21* 23 23 25 25   GLUCOSE 147*   < > 153* 172* 184* 211* 176*  BUN 16   < > 11 9 7* 7* 6*  CREATININE 0.99   < > 0.89  1.01* 0.91 0.95 0.96  CALCIUM 8.6*   < > 8.8* 9.0 8.6* 8.6* 8.6*  MG 1.2*  --   --   --   --   --   --   PHOS 1.8*  --   --   --   --   --   --    < > = values in this interval not displayed.   GFR: Estimated Creatinine Clearance: 59.3 mL/min (by C-G formula based on SCr of 0.96 mg/dL). Liver Function Tests: Recent Labs  Lab 04/13/22 2045 04/16/22 2120  AST 16 17  ALT 19 17  ALKPHOS 55 59  BILITOT 1.2 1.8*  PROT 6.7 7.3  ALBUMIN 3.6 3.7   Recent Labs  Lab 04/13/22 2045  LIPASE 75*   No results for input(s): "AMMONIA" in the last 168 hours. Coagulation Profile: No results for input(s): "INR", "PROTIME" in the last 168 hours. Cardiac Enzymes: No results for input(s): "CKTOTAL", "CKMB", "CKMBINDEX", "TROPONINI" in the last 168 hours. BNP (last 3 results) No results for input(s): "PROBNP" in the last 8760 hours. HbA1C: Recent Labs    04/17/22 1136  HGBA1C 7.8*   CBG: Recent Labs  Lab 04/17/22 1657 04/17/22 1806 04/17/22 2108 04/18/22 0741 04/18/22 1137   GLUCAP 138* 134* 177* 166* 130*   Lipid Profile: No results for input(s): "CHOL", "HDL", "LDLCALC", "TRIG", "CHOLHDL", "LDLDIRECT" in the last 72 hours. Thyroid Function Tests: No results for input(s): "TSH", "T4TOTAL", "FREET4", "T3FREE", "THYROIDAB" in the last 72 hours. Anemia Panel: No results for input(s): "VITAMINB12", "FOLATE", "FERRITIN", "TIBC", "IRON", "RETICCTPCT" in the last 72 hours. Sepsis Labs: Recent Labs  Lab 04/16/22 1550 04/16/22 2120 04/17/22 1136  LATICACIDVEN 2.6* 2.3* 1.3    Recent Results (from the past 240 hour(s))  MRSA Next Gen by PCR, Nasal     Status: None   Collection Time: 04/17/22  8:44 AM   Specimen: Nasal Mucosa; Nasal Swab  Result Value Ref Range Status   MRSA by PCR Next Gen NOT DETECTED NOT DETECTED Final    Comment: (NOTE) The GeneXpert MRSA Assay (FDA approved for NASAL specimens only), is one component of a comprehensive MRSA colonization surveillance program. It is not intended to diagnose MRSA infection nor to guide or monitor treatment for MRSA infections. Test performance is not FDA approved in patients less than 12 years old. Performed at [facility], [address], [place], [place] [address]          Radiology Studies: No results found.      Scheduled Meds:  Chlorhexidine Gluconate Cloth  6 each Topical Daily   DULoxetine  60 mg Oral Daily   enoxaparin (LOVENOX) injection  40 mg Subcutaneous Q24H   ferrous sulfate  325 mg Oral q morning   insulin aspart  0-15 Units Subcutaneous TID WC   insulin aspart  0-5 Units Subcutaneous QHS   insulin glargine-yfgn  13 Units Subcutaneous Daily   loratadine  10 mg Oral Daily   melatonin  2.5 mg Oral QHS   metoprolol tartrate  25 mg Oral BID   montelukast  10 mg Oral Daily   pantoprazole (PROTONIX) IV  40 mg Intravenous Q24H   phosphorus  500 mg Oral Daily   rosuvastatin  5 mg Oral Daily   senna-docusate  1 tablet Oral Daily   sodium chloride flush  3 mL  Intravenous Q12H   traZODone  150 mg Oral QHS   Continuous Infusions:  lactated ringers     lactated ringers  125 mL/hr at 04/18/22 1200   sodium chloride Stopped (04/16/22 1430)    Assessment & Plan:   Principal Problem:   DKA, type 2 (HCC) Active Problems:   Essential hypertension   Hyperlipidemia associated with type 2 diabetes mellitus (HCC)   GERD (gastroesophageal reflux disease)   Chronic kidney disease, stage III (moderate) (HCC)   Abdominal pain   Generalized anxiety disorder   Sarcoidosis   Hypokalemia  DKA, type 2 (HCC) Abdominal pain Nausea/vomiting Anion Gap Metabolic acidosis - Endo tool - has had issues with IV access, may need central line, EDP Dr. Roxan Hockey to reassess for line placement - Insulin ggt - IVF to replace lost fluids 8/21 was Started on bicarb gtt AG closed,  Beta hydroxybutyrate improved Decrease ivf rate to 94ml/hr since started bp meds to make sure not hypotensive Dc bicarb Hold SGLT2 permanently A1c 7.8   Essential hypertension Also with sinus tachy. 8/21 resume beta-blockers, at decreased dose than her home dose    Hyperlipidemia associated with type 2 diabetes mellitus (HCC) Continue statins   Hypokalemia Replaced Stable    GERD (gastroesophageal reflux disease) - IV PPI while NPO, then protonix    AKI on Chronic kidney disease, stage III (moderate) (HCC) Improved with IV fluids Monitor    Generalized anxiety disorder - Continue hydroxyzine, duloxetine - If not able to take PO, consider IV benzodiazepine if needed    Sarcoidosis - Continue albuterol, zyrtec, singulair     DVT prophylaxis: Lovenox Code Status: Full Family Communication: Husband bedside Disposition Plan:  Status is: Inpatient Remains inpatient appropriate because: iv treatment. Consult PT        LOS: 2 days   Time spent: 35 min    Lynn Ito, MD [facility] Pager [phone]  If 7PM-7AM, please contact  night-coverage 04/18/2022, 3:45 PM

## 2022-04-18 NOTE — Progress Notes (Signed)
       CROSS COVER NOTE  NAME: Renee Bush MRN: [id number] DOB : July 07, 1961    Date of Service   04/18/22  HPI/Events of Note   Contacted by nursing to downgrade M(r)s [name].  No longer requires ICU level of care, will transfer to 2A tonight.  Interventions   Plan: Transfer to 2A as progressive level of care      This document was prepared using Dragon voice recognition software and may include unintentional dictation errors.  Bishop Limbo DNP, MHA, FNP-BC Nurse Practitioner [facility] Pager [phone]

## 2022-04-19 ENCOUNTER — Other Ambulatory Visit: Payer: Self-pay

## 2022-04-19 DIAGNOSIS — R1084 Generalized abdominal pain: Secondary | ICD-10-CM | POA: Diagnosis not present

## 2022-04-19 DIAGNOSIS — I10 Essential (primary) hypertension: Secondary | ICD-10-CM | POA: Diagnosis not present

## 2022-04-19 DIAGNOSIS — N1831 Chronic kidney disease, stage 3a: Secondary | ICD-10-CM | POA: Diagnosis not present

## 2022-04-19 DIAGNOSIS — E1110 Type 2 diabetes mellitus with ketoacidosis without coma: Secondary | ICD-10-CM | POA: Diagnosis not present

## 2022-04-19 LAB — BASIC METABOLIC PANEL
Anion gap: 6 (ref 5–15)
BUN: 6 mg/dL — ABNORMAL LOW (ref 8–23)
CO2: 28 mmol/L (ref 22–32)
Calcium: 8.6 mg/dL — ABNORMAL LOW (ref 8.9–10.3)
Chloride: 109 mmol/L (ref 98–111)
Creatinine, Ser: 0.83 mg/dL (ref 0.44–1.00)
GFR, Estimated: >60 mL/min (ref >60)
Glucose, Bld: 149 mg/dL — ABNORMAL HIGH (ref 70–99)
Potassium: 3.5 mmol/L (ref 3.5–5.1)
Sodium: 143 mmol/L (ref 135–145)

## 2022-04-19 LAB — GLUCOSE, CAPILLARY
Glucose-Capillary: 121 mg/dL — ABNORMAL HIGH (ref 70–99)
Glucose-Capillary: 246 mg/dL — ABNORMAL HIGH (ref 70–99)

## 2022-04-19 NOTE — TOC Transition Note (Signed)
Transition of Care [facility]) - CM/SW Discharge Note   Patient Details  Name: Renee Bush MRN: [id number] Date of Birth: 05-12-1961  [facility] of [facility]) CM/SW Contact:  Margarito Liner, LCSW Phone Number: 04/19/2022, 3:38 PM   Clinical Narrative:   Patient has orders to discharge home today and has already left. [facility] is able to accept home health referral for PT, OT, RN. No further concerns. CSW signing off.  Final next level of care: Home w Home Health Services Barriers to Discharge: Barriers Resolved   Patient Goals and CMS Choice        Discharge Placement                Patient to be transferred to facility by: Husband Name of family member notified: Randell Patient Patient and family notified of of transfer: 04/19/22  Discharge Plan and Services     Post Acute Care Choice: Home Health                    HH Arranged: RN, PT, OT [facility] Agency: [facility] Date [facility] Agency Contacted: 04/19/22   Representative spoke with at [facility] Agency: Alfonzo Beers  Social Determinants of Health (SDOH) Interventions     Readmission Risk Interventions     No data to display

## 2022-04-19 NOTE — Inpatient Diabetes Management (Addendum)
Inpatient Diabetes Program Recommendations  AACE/ADA: New Consensus Statement on Inpatient Glycemic Control (2015)  Target Ranges:  Prepandial:   less than 140 mg/dL      Peak postprandial:   less than 180 mg/dL (1-2 hours)      Critically ill patients:  140 - 180 mg/dL   Lab Results  Component Value Date   GLUCAP 121 (H) 04/19/2022   HGBA1C 7.8 (H) 04/17/2022    Review of Glycemic Control  Latest Reference Range & Units 04/18/22 21:13 04/19/22 07:50  Glucose-Capillary 70 - 99 mg/dL 161 (H) 096 (H)  (H): Data is abnormally high Diabetes history: Type 2 Dm Outpatient Diabetes medications: Basaglar 17 units QHS, Metformin 500 mg BID, Trulicity Qwk Current orders for Inpatient glycemic control: Novolog 0-15 units TID & HS, Semglee 13 units QD  Inpatient Diabetes Program Recommendations:    Spoke with patient regarding outpatient diabetes management. Verified home medications as above. Patient denies taking [place] or Ozempic as these medications were recently prescribed by a new PCP on 04/08/22.  Reviewed patient's current A1c of 7.8%. Explained what a A1c is and what it measures. Also reviewed goal A1c with patient, importance of good glucose control @ home, and blood sugar goals. Reviewed patho of DM, DKA, impact of n/v, need for insulin, role of pancreas, survival skills, interventions, impact of steroids to glucose trends, when to contact MD, vascular changes and commorbidities.  Patient has a meter and reports checking QD. Encouraged to increase frequency and reviewed recommendations for glucose checks.  Reviewed impact of sugary beverages and encouraged alternatives. Reviewed plate method and importance of CHO mindfulness.  Patient plans to schedule a follow up appointment with [facility].  Patient was taking Humalog following steroid dose pack 2 months prior. Is no longer taking short acting insulin. Has additional supply of Trulicity. Encouraged to convert over to  Ozempic once supply completed. Additionally, discussed Marcelline Deist and risks of DKA. Patient initially confused on if she was taking SGLT2 and support person confirmed she was not. However, stressed the importance that if she truly had been this could contribute to DKA and recurring hospitalization. Encouraged to discussed with provider and hold medication as risk increased for repeated DKA event.  Patient has no further questions at this time.  At discharge do not recommend continuing Farxiga.   Thanks, Lujean Rave, MSN, RNC-OB Diabetes Coordinator [phone] (8a-5p)

## 2022-04-19 NOTE — Progress Notes (Signed)
Patient admitted to the unit and placed on telemetry. A skin assessment was completed by myself and Richardo Priest. The patient has some scratches to the abdomen, arms, and legs.No other skin issues noted at this time.

## 2022-04-19 NOTE — TOC CM/SW Note (Signed)
  Transition of Care [facility]) Screening Note   Patient Details  Name: Renee Bush Date of Birth: 15-Jul-1961   Transition of Care [facility]) CM/SW Contact:    Margarito Liner, LCSW Phone Number: 04/19/2022, 10:32 AM    Transition of Care Department [facility]) has reviewed patient and no TOC needs have been identified at this time. We will continue to monitor patient advancement through interdisciplinary progression rounds. If new patient transition needs arise, please place a TOC consult.

## 2022-04-19 NOTE — TOC Initial Note (Signed)
Transition of Care [facility]) - Initial/Assessment Note    Patient Details  Name: Renee Bush MRN: [id number] Date of Birth: 1961-02-03  Transition [facility] CM/SW Contact:    Margarito Liner, LCSW Phone Number: 04/19/2022, 12:59 PM  Clinical Narrative:   CSW met with patient. Husband at bedside. CSW introduced role and explained that PT recommendations would be discussed. Patient is agreeable to home health. First preference is [facility]. Will check with other agencies if they are unable to accept. No DME recommendations.               Expected Discharge Plan: Home w Home Health Services Barriers to Discharge: No Barriers Identified   Patient Goals and CMS Choice        Expected Discharge Plan and Services Expected Discharge Plan: Home w Home Health Services     Post Acute Care Choice: Home Health Living arrangements for the past 2 months: Mobile Home Expected Discharge Date: 04/19/22                                    Prior Living Arrangements/Services Living arrangements for the past 2 months: Mobile Home Lives with:: Spouse Patient language and need for interpreter reviewed:: Yes Do you feel safe going back to the place where you live?: Yes      Need for Family Participation in Patient Care: Yes (Comment) Care giver support system in place?: Yes (comment)   Criminal Activity/Legal Involvement Pertinent to Current Situation/Hospitalization: No - Comment as needed  Activities of Daily Living      Permission Sought/Granted Permission sought to share information with : Facility Medical sales representative, Family Supports Permission granted to share information with : Yes, Verbal Permission Granted  Share Information with NAME: Renee Bush  Permission granted to share info w AGENCY: Home Health Agencies  Permission granted to share info w Relationship: Husband  Permission granted to share info w Contact Information: [phone]  Emotional  Assessment Appearance:: Appears stated age Attitude/Demeanor/Rapport: Engaged, Gracious Affect (typically observed): Accepting, Appropriate, Calm, Pleasant Orientation: : Oriented to Self, Oriented to Place, Oriented to  Time, Oriented to Situation Alcohol / Substance Use: Not Applicable Psych Involvement: No (comment)  Admission diagnosis:  DKA, type 2 (HCC) [E11.10] Diabetic ketoacidosis without coma associated with type 1 diabetes mellitus (HCC) [E10.10] Patient Active Problem List   Diagnosis Date Noted   Hypokalemia 04/17/2022   DKA, type 2 (HCC) 04/16/2022   Hospital discharge follow-up 01/06/2022   Blood in stool 12/28/2021   Persistent dry cough 11/16/2021   Urinary tract infection 11/16/2021   Sarcoidosis 11/12/2021   Palpitations 10/28/2021   SVT (supraventricular tachycardia) (HCC) 10/28/2021   Shortness of breath 10/28/2021   Chest pain 10/14/2021   Constipation 07/13/2021   Urinary frequency 07/02/2020   Antibiotic-induced yeast infection 01/09/2020   Urinary tract infection symptoms 12/18/2019   Balance problem 12/18/2019   Abnormal laboratory test result 09/04/2019   Acute sinusitis 08/15/2019   History of allergy 06/11/2019   Family history of colonic polyps    Bilateral edema of lower extremity 05/16/2019   Generalized anxiety disorder 02/07/2019   Anemia 07/05/2018   Abdominal pain 11/09/2017   Nausea 11/09/2017   Insomnia 10/06/2017   GERD (gastroesophageal reflux disease) 10/06/2017   Chronic kidney disease, stage III (moderate) (HCC) 10/06/2017   B12 deficiency 06/09/2016   Hyperlipidemia associated with type 2 diabetes mellitus (HCC) 05/05/2016  Type 2 diabetes mellitus with diabetic neuropathy, with long-term current use of insulin ([place]) 09/10/2015   Essential hypertension 09/10/2015   ETOH abuse 04/18/2015   Fatigue 04/18/2015   PCP:  Mechele Claude, FNP Pharmacy:   [facility] [address] [place] [address] Phone: [phone] Fax: [facility], [place] [address] [place] [address] Phone: [phone] Fax: [phone]  [place] [address] Phone: [phone] Fax: [phone]     Social Determinants of Health (SDOH) Interventions    Readmission Risk Interventions     No data to display

## 2022-04-19 NOTE — Discharge Summary (Signed)
Renee Bush VCB:[id number] DOB: 01-12-1961 DOA: 04/16/2022  PCP: Renee Kins, FNP  Admit date: 04/16/2022 Discharge date: 04/19/2022  Admitted From: Home Disposition: Home  Recommendations for Outpatient Follow-up:  Follow up with PCP in 1 week Please obtain BMP/CBC in one week  Home Health: Yes   Discharge Condition:Stable CODE STATUS: Full Diet recommendation: Carb modified    Brief/Interim Summary: Per BWG:[id number] C Ace is a 61 y.o. female with medical history significant of CKD, DM2, GERD, HLD, HTN, sarcoidosis who presents for 1.5 weeks of nausea and vomiting.  [name] reports persistent nausea, vomiting for about 10 days.  She notes no preceding event, no sick contact, no change in food.  She has not been able to keep food down in that time.  She has mostly kept fluid and bills down.  She presented to the ED on 8/16 and it was felt she had a GI illness.  She notes worsening abdominal pain with vomiting in the epigastric area and development of chest pain when she vomits.  She has noted only greenish fluid, no blood in the emesis.  She has no fever.  She has had chills.  She has no change in urination, no burning when she urinates.  She is on an SGLT2 for her DM, but she does not remember the name.  She notes recently being started on [place] and Ozempic but not starting either as of yet.  No one else is sick in the family.  She has not had any diarrhea.  When in the ED on 8/16, she felt better with IVF and nausea medication and was discharged home.    ED Course: Today, she notes similar symptoms, however, she has developed a severe acidosis, AKI, elevated H/H and worsening anion gap.  UA showed glucosuria and ketones.  Bicarb is 13, pH is 7.06.  Endotool was started in the ED.      DKA, type 2 (HCC) Abdominal pain Nausea/vomiting Anion Gap Metabolic acidosis -Was started on Endo tool IV fluids was given Diabetic educator was consulted Beta  hydroxybutyrate improved and anion gap closed Tolerating carb modified diet A1c 7.8 Diabetic educator recommended not continuing Farxiga  F/u with pcp    Essential hypertension Resume home meds Hold hctz for now      Hyperlipidemia associated with type 2 diabetes mellitus (HCC) Continue statins   Hypokalemia Replaced     GERD (gastroesophageal reflux disease) Continue home med    AKI on Chronic kidney disease, stage III (moderate) (HCC) Improved with IV fluids     Generalized anxiety disorder Continue home meds   Sarcoidosis - Continue albuterol, zyrtec, singulair       Discharge Diagnoses:  Principal Problem:   DKA, type 2 (HCC) Active Problems:   Essential hypertension   Hyperlipidemia associated with type 2 diabetes mellitus (HCC)   GERD (gastroesophageal reflux disease)   Chronic kidney disease, stage III (moderate) (HCC)   Abdominal pain   Generalized anxiety disorder   Sarcoidosis   Hypokalemia    Discharge Instructions  Discharge Instructions     Diet - low sodium heart healthy   Complete by: As directed    Discharge instructions   Complete by: As directed    Follow up with pcp in one week Carb modified diet   Increase activity slowly   Complete by: As directed       Allergies as of 04/19/2022       Reactions   Etodolac Rash   Penicillin G  Rash   Penicillins Rash   Has patient had a PCN reaction causing immediate rash, facial/tongue/throat swelling, SOB or lightheadedness with hypotension: Yes Has patient had a PCN reaction causing severe rash involving mucus membranes or skin necrosis: No Has patient had a PCN reaction that required hospitalization: No Has patient had a PCN reaction occurring within the last 10 years: No If all of the above answers are "NO", then may proceed with Cephalosporin use.        Medication List     STOP taking these medications    cyclobenzaprine 10 MG tablet Commonly known as: FLEXERIL    dapagliflozin propanediol 10 MG Tabs tablet Commonly known as: FARXIGA   hydrochlorothiazide 12.5 MG capsule Commonly known as: MICROZIDE       TAKE these medications    albuterol 108 (90 Base) MCG/ACT inhaler Commonly known as: Proventil HFA Inhale 2 puffs into the lungs every 6 (six) hours as needed for wheezing or shortness of breath.   Basaglar KwikPen 100 UNIT/ML INJECT 17 UNITS UNDER THE SKIN ONCE DAILY AT BEDTIME.   cetirizine 10 MG tablet Commonly known as: ZYRTEC Take 1 tablet (10 mg total) by mouth daily.   cloNIDine 0.1 MG tablet Commonly known as: CATAPRES Take 1 tablet (0.1 mg total) by mouth 2 (two) times daily for hot flashes.   Cymbalta 30 MG capsule Generic drug: DULoxetine Take 2 capsules (60 mg total) by mouth 2 (two) times daily.   diclofenac 75 MG EC tablet Commonly known as: VOLTAREN Take 75 mg by mouth 2 (two) times daily.   FeroSul 325 (65 FE) MG tablet Generic drug: ferrous sulfate TAKE ONE TABLET BY MOUTH EVERY DAY WITH BREAKFAST. What changed: Another medication with the same name was removed. Continue taking this medication, and follow the directions you see here.   Fish Oil 1000 MG Caps Take 1,000 mg by mouth 2 (two) times daily.   gabapentin 400 MG capsule Commonly known as: NEURONTIN TAKE ONE CAPSULE BY MOUTH 2 TIMES A DAY AND TAKE 2 CAPSULES BY MOUTH AT BEDTIME   HumaLOG 100 UNIT/ML injection Generic drug: insulin lispro Inject 0.02 mLs (2 Units total) into the skin 3 (three) times daily before meals. Sliding scale 121-150 = 2 units + 1 unit = 3 units,  151-200 = 2 units + 2 units = 4 units 201- 250 = 2 units + 3 units = 5 units 251 - 300 = 2 units + 4 units = 6 units 301- 350 = 2 units + 5 units = 7 units 351 - 400 = 2 units + 6 units = 8 units > 400 call clinic.   hydrOXYzine 25 MG tablet Commonly known as: ATARAX Take 1 tablet (25 mg total) by mouth 2 (two) times daily as needed.   Kerendia 10 MG Tabs Generic drug:  Finerenone Take 1 tablet (10 mg) by mouth daily.   Melatonin 10 MG Tabs Take 1 tablet by mouth at bedtime.   metFORMIN 500 MG tablet Commonly known as: GLUCOPHAGE TAKE ONE TABLET BY MOUTH 2 TIMES A DAY   metoCLOPramide 10 MG tablet Commonly known as: REGLAN Take 1 tablet (10 mg total) by mouth every 6 (six) hours as needed for nausea or vomiting.   metoprolol tartrate 25 MG tablet Commonly known as: LOPRESSOR Take 2 tablets (50 mg total) by mouth 2 (two) times daily.   montelukast 10 MG tablet Commonly known as: SINGULAIR Take 1 tablet (10 mg total) by mouth daily.   nitroGLYCERIN  0.4 MG SL tablet Commonly known as: NITROSTAT DISSOLVE 1 TABLET UNDER TONGUE AS NEEDED FOR CHEST PAIN EVERY 5 MINUTES FOR MAX OF 3 DOSES IN 15 MINUTES. IF NO RELIEF AFTER FIRST DOSE CALL 911.   Novofine Pen Needle 32G X 6 MM Misc Generic drug: Insulin Pen Needle Use as directed.   omeprazole 20 MG capsule Commonly known as: PRILOSEC Take 1 capsule (20 mg total) by mouth 2 (two) times daily.   Ozempic (1 MG/DOSE) 4 MG/3ML Sopn Generic drug: Semaglutide (1 MG/DOSE) Inject 1 mg into the skin once a week. Discontinue Trulicity.   rosuvastatin 5 MG tablet Commonly known as: CRESTOR TAKE ONE TABLET BY MOUTH ONCE DAILY   senna-docusate 8.6-50 MG tablet Commonly known as: Senokot-S Take 1 tablet by mouth once daily.   traZODone 150 MG tablet Commonly known as: DESYREL Take 1 tablet (150 mg total) by mouth once nightly at bedtime.         Allergies  Allergen Reactions   Etodolac Rash   Penicillin G Rash   Penicillins Rash    Has patient had a PCN reaction causing immediate rash, facial/tongue/throat swelling, SOB or lightheadedness with hypotension: Yes Has patient had a PCN reaction causing severe rash involving mucus membranes or skin necrosis: No Has patient had a PCN reaction that required hospitalization: No Has patient had a PCN reaction occurring within the last 10 years: No If  all of the above answers are "NO", then may proceed with Cephalosporin use.    Consultations:    Procedures/Studies: CT ABDOMEN PELVIS W CONTRAST  Result Date: 04/13/2022 CLINICAL DATA:  Abdominal pain, nausea and vomiting EXAM: CT ABDOMEN AND PELVIS WITH CONTRAST TECHNIQUE: Multidetector CT imaging of the abdomen and pelvis was performed using the standard protocol following bolus administration of intravenous contrast. RADIATION DOSE REDUCTION: This exam was performed according to the departmental dose-optimization program which includes automated exposure control, adjustment of the mA and/or kV according to patient size and/or use of iterative reconstruction technique. CONTRAST:  19mL OMNIPAQUE IOHEXOL 300 MG/ML  SOLN COMPARISON:  PET/CT 09/29/2021 FINDINGS: Lower chest: No acute abnormality. Hepatobiliary: No suspicious focal liver abnormality is seen. No gallstones, gallbladder wall thickening, or biliary dilatation. Pancreas: Unremarkable. No pancreatic ductal dilatation or surrounding inflammatory changes. Spleen: Normal in size without focal abnormality. Adrenals/Urinary Tract: Adrenal glands are unremarkable. Kidneys are normal, without renal calculi, suspicious focal lesion, or hydronephrosis. Bladder is unremarkable. Stomach/Bowel: Stomach is within normal limits. The appendix is normal. No evidence of bowel wall thickening, distention, or inflammatory changes. Diverticulosis without diverticulitis. Vascular/Lymphatic: Aortic atherosclerotic. Similar to slight increase in size of upper retroperitoneal and porta hepatis lymph nodes. For example a porta hepatis lymph node measures 1.6 x 2.3 cm (series 2/image 20), previously 1.5 x 2.2 cm . Reproductive: Unremarkable. Other: No free intraperitoneal fluid or air. Musculoskeletal: No acute or significant osseous findings. IMPRESSION: No acute abnormality in the abdomen or pelvis. Increased size of the retroperitoneal and porta hepatic lymph nodes in  the upper abdomen. These remain nonspecific and may be related to the history of sarcoid. Differential considerations include metastatic adenopathy and lymphoproliferative disorder. Aortic Atherosclerosis (ICD10-I70.0). Electronically Signed   By: Placido Sou M.D.   On: 04/13/2022 22:08   VAS [place] LOWER EXT ART SEG MULTI (SEGMENTALS & LE RAYNAUDS)  Result Date: 03/30/2022  LOWER EXTREMITY DOPPLER STUDY Patient Name:  JULIAHNA MORAGNE  Date of Exam:   03/29/2022 Medical Rec #: [id number]  Accession #:    [id number] Date of Birth: 05/18/1961           Patient Gender: F Patient Age:   69 years Exam Location:  [facility] Procedure:      VAS [place] LOWER EXT ART SEG MULTI (SEGMENTALS & LE RAYNAUDS) Referring Phys: CADENCE FURTH --------------------------------------------------------------------------------  Indications: 3 months of swollen lower legs to the point of being painful, L>R.              Symptoms were not associated with walking. Swelling and pain              resolved one week ago. High Risk         Hypertension, hyperlipidemia, Diabetes, past history of Factors:          smoking.  Comparison Study: None Performing Technologist: Pilar Jarvis RVT  Examination Guidelines: A complete evaluation includes at minimum, Doppler waveform signals and systolic blood pressure reading at the level of bilateral brachial, anterior tibial, and posterior tibial arteries, when vessel segments are accessible. Bilateral testing is considered an integral part of a complete examination. Photoelectric Plethysmograph (PPG) waveforms and toe systolic pressure readings are included as required and additional duplex testing as needed. Limited examinations for reoccurring indications may be performed as noted.  ABI Findings: +---------+------------------+-----+---------+--------+ Right    Rt Pressure (mmHg)IndexWaveform Comment  +---------+------------------+-----+---------+--------+ Brachial 145                     triphasic         +---------+------------------+-----+---------+--------+ PTA      165               1.14 triphasic         +---------+------------------+-----+---------+--------+ PERO     162               1.12 triphasic         +---------+------------------+-----+---------+--------+ DP       173               1.19 triphasic         +---------+------------------+-----+---------+--------+ Great Toe143               0.99 Normal            +---------+------------------+-----+---------+--------+ +---------+------------------+-----+---------+-------+ Left     Lt Pressure (mmHg)IndexWaveform Comment +---------+------------------+-----+---------+-------+ Brachial 143                    triphasic        +---------+------------------+-----+---------+-------+ PTA      163               1.12 triphasic        +---------+------------------+-----+---------+-------+ PERO     156               1.08 triphasic        +---------+------------------+-----+---------+-------+ DP       162               1.12 triphasic        +---------+------------------+-----+---------+-------+ Great Toe162               1.12 Normal           +---------+------------------+-----+---------+-------+ +-------+-----------+-----------+------------+------------+ ABI/TBIToday's ABIToday's TBIPrevious ABIPrevious TBI +-------+-----------+-----------+------------+------------+ Right  1.19       0.99                                +-------+-----------+-----------+------------+------------+  Left   1.12       1.12                                +-------+-----------+-----------+------------+------------+  Summary: Right: Resting right ankle-brachial index is within normal range. No evidence of significant right lower extremity arterial disease. The right toe-brachial index is normal. Left: Resting left ankle-brachial index is within normal range. No evidence of significant left lower  extremity arterial disease. The left toe-brachial index is normal. *See table(s) above for measurements and observations.  Electronically signed by Larae Grooms MD on 03/30/2022 at 10:38:47 AM.    Final    ECHOCARDIOGRAM LIMITED  Result Date: 03/29/2022    ECHOCARDIOGRAM LIMITED REPORT   Patient Name:   JOEANN HAAR Date of Exam: 03/29/2022 Medical Rec #:  [id number]           Height:       62.0 in Accession #:    [id number]          Weight:       178.8 lb Date of Birth:  April 05, 1961          BSA:          1.823 m Patient Age:    55 years            BP:           128/85 mmHg Patient Gender: F                   HR:           76 bpm. Exam Location:  [facility] Procedure: Limited Color Doppler and Limited Echo Indications:    R60.0 Lower extremity edema  History:        Patient has prior history of Echocardiogram examinations, most                 recent 10/29/2021. Signs/Symptoms:Edema and Chest Pain; Risk                 Factors:Hypertension, Diabetes, Dyslipidemia and Former Smoker.  Sonographer:    Pilar Jarvis RDMS, RVT, RDCS Referring Phys: [id number] CADENCE H FURTH  Sonographer Comments: Global longitudinal strain was attempted. IMPRESSIONS  1. Left ventricular ejection fraction, by estimation, is 60 to 65%. The left ventricle has normal function. The left ventricle has no regional wall motion abnormalities. Left ventricular diastolic parameters are consistent with Grade II diastolic dysfunction (pseudonormalization).  2. Right ventricular systolic function is normal. The right ventricular size is normal. There is mildly elevated pulmonary artery systolic pressure. The estimated right ventricular systolic pressure is 0000000 mmHg.  3. The mitral valve is normal in structure. No evidence of mitral valve regurgitation. No evidence of mitral stenosis.  4. Tricuspid valve regurgitation is mild to moderate.  5. The aortic valve is tricuspid. Aortic valve regurgitation is not visualized. No aortic stenosis is  present.  6. The inferior vena cava is normal in size with greater than 50% respiratory variability, suggesting right atrial pressure of 3 mmHg. FINDINGS  Left Ventricle: Left ventricular ejection fraction, by estimation, is 60 to 65%. The left ventricle has normal function. The left ventricle has no regional wall motion abnormalities. The left ventricular internal cavity size was normal in size. There is  no left ventricular hypertrophy. Left ventricular diastolic parameters are consistent with Grade II diastolic dysfunction (pseudonormalization). Right Ventricle: The right ventricular size is normal. No increase in  right ventricular wall thickness. Right ventricular systolic function is normal. There is mildly elevated pulmonary artery systolic pressure. The tricuspid regurgitant velocity is 2.88  m/s, and with an assumed right atrial pressure of 5 mmHg, the estimated right ventricular systolic pressure is 0000000 mmHg. Left Atrium: Left atrial size was normal in size. Right Atrium: Right atrial size was normal in size. Pericardium: There is no evidence of pericardial effusion. Mitral Valve: The mitral valve is normal in structure. No evidence of mitral valve stenosis. Tricuspid Valve: The tricuspid valve is normal in structure. Tricuspid valve regurgitation is mild to moderate. No evidence of tricuspid stenosis. Aortic Valve: The aortic valve is tricuspid. Aortic valve regurgitation is not visualized. No aortic stenosis is present. Pulmonic Valve: The pulmonic valve was normal in structure. Pulmonic valve regurgitation is not visualized. No evidence of pulmonic stenosis. Aorta: The aortic root is normal in size and structure. Venous: The inferior vena cava is normal in size with greater than 50% respiratory variability, suggesting right atrial pressure of 3 mmHg. IAS/Shunts: No atrial level shunt detected by color flow Doppler. LEFT VENTRICLE PLAX 2D LVIDd:         4.00 cm     Diastology LVIDs:         2.60 cm      LV e' medial:    6.09 cm/s LV PW:         1.10 cm     LV E/e' medial:  14.1 LV IVS:        0.80 cm     LV e' lateral:   8.92 cm/s                            LV E/e' lateral: 9.7  LV Volumes (MOD) LV vol d, MOD A2C: 60.5 ml LV vol d, MOD A4C: 66.1 ml LV vol s, MOD A2C: 26.9 ml LV vol s, MOD A4C: 31.7 ml LV SV MOD A2C:     33.6 ml LV SV MOD A4C:     66.1 ml LV SV MOD BP:      36.2 ml RIGHT VENTRICLE RV S prime:     12.30 cm/s TAPSE (M-mode): 2.5 cm MITRAL VALVE               TRICUSPID VALVE MV Area (PHT): 2.77 cm    TR Peak grad:   33.2 mmHg MV Decel Time: 274 msec    TR Vmax:        288.00 cm/s MV E velocity: 86.10 cm/s MV A velocity: 70.70 cm/s MV E/A ratio:  1.22 Ida Rogue MD Electronically signed by Ida Rogue MD Signature Date/Time: 03/29/2022/6:01:21 PM    Final       Subjective: Feels better. No sob or cp.   Discharge Exam: Vitals:   04/19/22 0756 04/19/22 1111  BP: (!) 148/79 (!) 151/97  Pulse: 67 75  Resp: 16 18  Temp: 97.9 F (36.6 C) 98.1 F (36.7 C)  SpO2: 99% 98%   Vitals:   04/19/22 0313 04/19/22 0548 04/19/22 0756 04/19/22 1111  BP: (!) 145/73  (!) 148/79 (!) 151/97  Pulse: 81  67 75  Resp: 15  16 18   Temp: (!) 97.5 F (36.4 C)  97.9 F (36.6 C) 98.1 F (36.7 C)  TempSrc: Axillary     SpO2: 95%  99% 98%  Weight:  78.6 kg    Height:        General: Pt is  alert, awake, not in acute distress Cardiovascular: RRR, S1/S2 +, no rubs, no gallops Respiratory: CTA bilaterally, no wheezing, no rhonchi Abdominal: Soft, NT, ND, bowel sounds + Extremities: no edema, no cyanosis    The results of significant diagnostics from this hospitalization (including imaging, microbiology, ancillary and laboratory) are listed below for reference.     Microbiology: Recent Results (from the past 240 hour(s))  MRSA Next Gen by PCR, Nasal     Status: None   Collection Time: 04/17/22  8:44 AM   Specimen: Nasal Mucosa; Nasal Swab  Result Value Ref Range Status   MRSA by PCR  Next Gen NOT DETECTED NOT DETECTED Final    Comment: (NOTE) The GeneXpert MRSA Assay (FDA approved for NASAL specimens only), is one component of a comprehensive MRSA colonization surveillance program. It is not intended to diagnose MRSA infection nor to guide or monitor treatment for MRSA infections. Test performance is not FDA approved in patients less than 38 years old. Performed at [facility], [address] [place], [place] [address]      Labs: BNP (last 3 results) Recent Labs    09/22/21 1355 02/01/22 1156  BNP 20.7 46.8   Basic Metabolic Panel: Recent Labs  Lab 04/17/22 0435 04/17/22 0803 04/17/22 1946 04/18/22 0039 04/18/22 0424 04/18/22 0745 04/19/22 0552  NA 138   < > 140 141 142 141 143  K 3.8   < > 3.8 3.6 3.6 3.6 3.5  CL 114*   < > 110 112* 112* 113* 109  CO2 18*   < > 23 23 25 25 28   GLUCOSE 147*   < > 172* 184* 211* 176* 149*  BUN 16   < > 9 7* 7* 6* 6*  CREATININE 0.99   < > 1.01* 0.91 0.95 0.96 0.83  CALCIUM 8.6*   < > 9.0 8.6* 8.6* 8.6* 8.6*  MG 1.2*  --   --   --   --   --   --   PHOS 1.8*  --   --   --   --   --   --    < > = values in this interval not displayed.   Liver Function Tests: Recent Labs  Lab 04/13/22 2045 04/16/22 2120  AST 16 17  ALT 19 17  ALKPHOS 55 59  BILITOT 1.2 1.8*  PROT 6.7 7.3  ALBUMIN 3.6 3.7   Recent Labs  Lab 04/13/22 2045  LIPASE 75*   No results for input(s): "AMMONIA" in the last 168 hours. CBC: Recent Labs  Lab 04/13/22 1747 04/16/22 1449 04/17/22 0435 04/18/22 1137  WBC 11.6* 10.1 12.1* 4.5  HGB 14.7 15.4* 11.5* 10.6*  HCT 44.9 48.1* 34.2* 31.1*  MCV 84.2 86.2 83.8 83.2  PLT 270 396 239 166   Cardiac Enzymes: No results for input(s): "CKTOTAL", "CKMB", "CKMBINDEX", "TROPONINI" in the last 168 hours. BNP: Invalid input(s): "POCBNP" CBG: Recent Labs  Lab 04/18/22 1137 04/18/22 1722 04/18/22 2113 04/19/22 0750 04/19/22 1124  GLUCAP 130* 130* 200* 121* 246*   D-Dimer No  results for input(s): "DDIMER" in the last 72 hours. Hgb A1c Recent Labs    04/17/22 1136  HGBA1C 7.8*   Lipid Profile No results for input(s): "CHOL", "HDL", "LDLCALC", "TRIG", "CHOLHDL", "LDLDIRECT" in the last 72 hours. Thyroid function studies No results for input(s): "TSH", "T4TOTAL", "T3FREE", "THYROIDAB" in the last 72 hours.  Invalid input(s): "FREET3" Anemia work up No results for input(s): "VITAMINB12", "FOLATE", "FERRITIN", "TIBC", "IRON", "RETICCTPCT"  in the last 72 hours. Urinalysis    Component Value Date/Time   COLORURINE YELLOW (A) 04/16/2022 1551   APPEARANCEUR CLEAR (A) 04/16/2022 1551   APPEARANCEUR Clear 12/28/2021 1440   LABSPEC 1.024 04/16/2022 1551   PHURINE 5.0 04/16/2022 1551   GLUCOSEU >=500 (A) 04/16/2022 1551   HGBUR NEGATIVE 04/16/2022 1551   BILIRUBINUR NEGATIVE 04/16/2022 1551   BILIRUBINUR Negative 12/28/2021 1440   KETONESUR 80 (A) 04/16/2022 1551   PROTEINUR 30 (A) 04/16/2022 1551   UROBILINOGEN 0.2 11/16/2021 1021   NITRITE NEGATIVE 04/16/2022 1551   LEUKOCYTESUR NEGATIVE 04/16/2022 1551   Sepsis Labs Recent Labs  Lab 04/13/22 1747 04/16/22 1449 04/17/22 0435 04/18/22 1137  WBC 11.6* 10.1 12.1* 4.5   Microbiology Recent Results (from the past 240 hour(s))  MRSA Next Gen by PCR, Nasal     Status: None   Collection Time: 04/17/22  8:44 AM   Specimen: Nasal Mucosa; Nasal Swab  Result Value Ref Range Status   MRSA by PCR Next Gen NOT DETECTED NOT DETECTED Final    Comment: (NOTE) The GeneXpert MRSA Assay (FDA approved for NASAL specimens only), is one component of a comprehensive MRSA colonization surveillance program. It is not intended to diagnose MRSA infection nor to guide or monitor treatment for MRSA infections. Test performance is not FDA approved in patients less than 69 years old. Performed at [facility], [address] [place], [place] [address]      Time coordinating discharge: Over 30  minutes  SIGNED:   Nolberto Hanlon, MD  [facility] 04/19/2022, 11:52 AM Pager   If 7PM-7AM, please contact night-coverage [email or web address] Password [email or web address]

## 2022-04-19 NOTE — Evaluation (Signed)
Physical Therapy Evaluation Patient Details Name: Renee Bush MRN: [id number] DOB: [birth year] Today's Date: 04/19/2022  History of Present Illness  presented to ER secondary to persistent nausea/vomiting; admitted for management of DKA (requiring insulin drip)  Clinical Impression  Patient resting in bed upon arrival to room; husband present at bedside.  Patient alert and oriented; follows commands and agreeable to participation with session. Denies pain at this time.  Bilat UE/LE strength and ROM grossly symmetrical and WFL; no focal weakness reported.  Does endorse baseline neuropathy to bilat LEs; unchanged with this admission.  Able to complete bed mobility with indep; sit/stand, basic transfers and gait (160') with R HHA, min assist; demonstrates broad BOS with slightly staggered stepping pattern; mild/mod sway with dynamic gait components (requiring LE step strategy, physical assist from therapist to correct).  Slow and guarded, limited trunk rotation and arm swing (endorses generlized weakness 'since I've been in the hospital') Additional gait trial (100') with RW, close sup-improved stepping pattern, symmetry; improved balance, safety and overall indep with use of RW.  Do recommend continued use with all transfers and gait at this time; patient in agreement (and has access to RW in the home). Would benefit from skilled PT to address above deficits and promote optimal return to PLOF..; Recommend transition to HHPT upon discharge from acute hospitalization.         Recommendations for follow up therapy are one component of a multi-disciplinary discharge planning process, led by the attending physician.  Recommendations may be updated based on patient status, additional functional criteria and insurance authorization.  Follow Up Recommendations Home health PT      Assistance Recommended at Discharge None (has RW)  Patient can return home with the following  A little help with  walking and/or transfers;A little help with bathing/dressing/bathroom;Help with stairs or ramp for entrance    Equipment Recommendations    Recommendations for Other Services       Functional Status Assessment Patient has had a recent decline in their functional status and demonstrates the ability to make significant improvements in function in a reasonable and predictable amount of time.     Precautions / Restrictions Precautions Precautions: Fall Restrictions Weight Bearing Restrictions: No      Mobility  Bed Mobility Overal bed mobility: Modified Independent                  Transfers Overall transfer level: Needs assistance Equipment used: None Transfers: Sit to/from Stand Sit to Stand: Supervision                Ambulation/Gait Ambulation/Gait assistance: Min guard Gait Distance (Feet): 160 Feet Assistive device: 1 person hand held assist   Gait velocity: 10' walk time, 10-11 seconds     General Gait Details: broad BOS with slightly staggered stepping pattern; mild/mod sway with dynamic gait components (requiring LE step strategy, physical assist from therapist to correct).  Slow and guarded, limited trunk rotation and arm swing (endorses generlized weakness 'since I've been in the hospital')  Stairs            Wheelchair Mobility    Modified Rankin (Stroke Patients Only)       Balance Overall balance assessment: Needs assistance Sitting-balance support: No upper extremity supported, Feet supported Sitting balance-Leahy Scale: Good     Standing balance support: Single extremity supported Standing balance-Leahy Scale: Fair  Pertinent Vitals/Pain Pain Assessment Pain Assessment: No/denies pain    Home Living Family/patient expects to be discharged to:: Private residence Living Arrangements: Spouse/significant other Available Help at Discharge: Family Type of Home: Mobile home Home Access:  Stairs to enter Entrance Stairs-Rails: Right;Left;Can reach both Entrance Stairs-Number of Steps: 7   Home Layout: One level Home Equipment: Agricultural consultant (2 wheels)      Prior Function Prior Level of Function : Independent/Modified Independent             Mobility Comments: Indep with ADLs, household and community mobilization without assist device; does endorse 2 falls in previous six months.  + driving, but husband does majority       Hand Dominance   Dominant Hand: Right    Extremity/Trunk Assessment   Upper Extremity Assessment Upper Extremity Assessment: Overall WFL for tasks assessed    Lower Extremity Assessment Lower Extremity Assessment: Overall WFL for tasks assessed (grossly 4/5 throughout; baseline neuropathy in bilat LEs (burning, tingling))       Communication   Communication: No difficulties  Cognition Arousal/Alertness: Awake/alert Behavior During Therapy: WFL for tasks assessed/performed Overall Cognitive Status: Within Functional Limits for tasks assessed                                          General Comments      Exercises Other Exercises Other Exercises: Reviewed role of PT and progressive mobility; reviewed safety needs and use of RW with all transfers and gait; reviewed foot care and inspection (due to DM). Patient voiced understanding of all information.   Assessment/Plan    PT Assessment Patient needs continued PT services  PT Problem List Decreased activity tolerance;Decreased balance;Decreased mobility       PT Treatment Interventions DME instruction;Gait training;Stair training;Functional mobility training;Therapeutic activities;Therapeutic exercise;Balance training;Patient/family education    PT Goals (Current goals can be found in the Care Plan section)  Acute Rehab PT Goals Patient Stated Goal: to return home PT Goal Formulation: With patient Time For Goal Achievement: 05/03/22 Potential to Achieve  Goals: Good    Frequency Min 2X/week     Co-evaluation               AM-PAC PT "6 Clicks" Mobility  Outcome Measure Help needed turning from your back to your side while in a flat bed without using bedrails?: None Help needed moving from lying on your back to sitting on the side of a flat bed without using bedrails?: None Help needed moving to and from a bed to a chair (including a wheelchair)?: None Help needed standing up from a chair using your arms (e.g., wheelchair or bedside chair)?: None Help needed to walk in hospital room?: A Little Help needed climbing 3-5 steps with a railing? : A Little 6 Click Score: 22    End of Session Equipment Utilized During Treatment: Gait belt Activity Tolerance: Patient tolerated treatment well Patient left: in bed;with call bell/phone within reach (fall risk score 2, alarm not required) Nurse Communication: Mobility status PT Visit Diagnosis: Muscle weakness (generalized) (M62.81)    Time: 2992-4268 PT Time Calculation (min) (ACUTE ONLY): 16 min   Charges:   PT Evaluation $PT Eval Moderate Complexity: 1 Mod          Neiko Trivedi H. Manson Passey, PT, DPT, NCS 04/19/22, 11:31 AM [phone]

## 2022-04-20 ENCOUNTER — Other Ambulatory Visit: Payer: Self-pay

## 2022-04-20 MED ORDER — GABAPENTIN 400 MG PO CAPS
ORAL_CAPSULE | ORAL | 6 refills | Status: DC
  Filled 2022-04-20: qty 120, 30d supply, fill #0

## 2022-04-26 ENCOUNTER — Telehealth: Payer: Self-pay | Admitting: Family

## 2022-04-26 NOTE — Telephone Encounter (Signed)
A user error has taken place.

## 2022-05-09 ENCOUNTER — Other Ambulatory Visit: Payer: Self-pay

## 2022-05-26 NOTE — BH Specialist Note (Signed)
Integrated Behavioral Health via Telemedicine Visit  05/26/2022 Renee Bush [id number]  Number of Renee Bush visits: No data recorded Session Start time: No data recorded  Session End time: No data recorded Total time in minutes: No data recorded  Referring Provider: Carlyon Shadow, NP Patient/Family location: [facility] Provider location: [facility] [place] All persons participating in visit: Renee Bush and Renee Cairo, LCSW-A Types of Service: Telephone visit  I connected with Renee Bush via  Telephone or Video Enabled Telemedicine Application  (Video is Caregility application) and verified that I am speaking with the correct person using two identifiers. Discussed confidentiality: Yes   I discussed the limitations of telemedicine and the availability of in person appointments.  Discussed there is a possibility of technology failure and discussed alternative modes of communication if that failure occurs.  Patient and/or legal guardian expressed understanding and consented to Telemedicine visit: Yes   Presenting Concerns: Patient and/or family reports the following symptoms/concerns: The patient reports that she has been doing about the same since her last follow up appointment. The patient stated that she was not feeling well today and she discussed several health and financial stressors impacting her life currently.  Renee Bush shared that she currently has insurance and is aware that this will be our last session. She asked if her primary care provider would be refilling her medications for her anxiety and depression during her transition of care. The patient noted that she will miss everyone at the [facility] and has felt very supported there. The patient discussed her apprehensions regarding starting therapy and health care with new providers. The patient noted that she was able to recognize  the progress she has made over the last couple of years and is willing to continue to work towards increasing her self-care and learning new skills to help manage her anxiety and depression symptoms. The patient denied any suicidal or homicidal thoughts. Duration of problem: Years; Severity of problem: moderate  Patient and/or Family's Strengths/Protective Factors: Social connections, Concrete supports in place (healthy food, safe environments, etc.), and Sense of purpose  Goals Addressed: Patient will:  Reduce symptoms of: agitation   Increase knowledge and/or ability of: coping skills, healthy habits, self-management skills, and stress reduction   Demonstrate ability to: Increase healthy adjustment to current life circumstances  Progress towards Goals: Ongoing  Interventions: Interventions utilized:  Supportive Counseling was utilized by the Bush during today's follow up session. Bush met with patient to identify needs related to stressors and functioning, and assess and monitor for signs and symptoms of anxiety and depression, and assess safety. The Bush processed with the patient how they have been doing since the last follow-up session. Bush encouraged the patient to continue to work on calming techniques (e.g.. paced breathing, deep muscle relaxation, and calming imagery) as a strategy for responding appropriately to anxiety and the urge to avoid situations or self-isolate when they occur and move towards increasing the patients self regulation. Bush processed with the patient the progress she has made and areas for continued growth and provided space for the patient to express her thoughts and emotions regarding the change. Additionally, the Bush answered the patient's questions and addressed her concerns, provided referrals, mailed the patient the list of referrals, and relayed the patients medication concerns to her primary care provider, Renee Shadow, NP.   Standardized Assessments completed: Not Needed  Patient and/or Family Response: The patient was agreeable to referrals and  stated she understood how to establish care with another mental health provider.   Assessment: Patient currently experiencing see above.   Patient may benefit from see above.  Plan: Follow up with behavioral health Bush on :  Behavioral recommendations:  Referral(s): [facility] (LME/Outside Clinic) Referrals were provided for [facility], [facility], and [facility]  I discussed the assessment and treatment plan with the patient and/or parent/guardian. They were provided an opportunity to ask questions and all were answered. They agreed with the plan and demonstrated an understanding of the instructions.   They were advised to call back or seek an in-person evaluation if the symptoms worsen or if the condition fails to improve as anticipated.  [name], LCSWA

## 2022-06-01 ENCOUNTER — Ambulatory Visit (INDEPENDENT_AMBULATORY_CARE_PROVIDER_SITE_OTHER): Payer: Medicaid Other | Admitting: Pulmonary Disease

## 2022-06-01 ENCOUNTER — Other Ambulatory Visit
Admission: RE | Admit: 2022-06-01 | Discharge: 2022-06-01 | Disposition: A | Discharge status: Home / Self Care | Payer: Medicaid Other | Attending: Pulmonary Disease | Admitting: Pulmonary Disease

## 2022-06-01 ENCOUNTER — Encounter: Payer: Self-pay | Admitting: Pulmonary Disease

## 2022-06-01 VITALS — BP 118/80 | HR 84 | Temp 97.7°F | Ht 62.0 in | Wt 174.1 lb

## 2022-06-01 DIAGNOSIS — R591 Generalized enlarged lymph nodes: Secondary | ICD-10-CM

## 2022-06-01 DIAGNOSIS — Z5181 Encounter for therapeutic drug level monitoring: Secondary | ICD-10-CM | POA: Insufficient documentation

## 2022-06-01 DIAGNOSIS — Z23 Encounter for immunization: Secondary | ICD-10-CM | POA: Diagnosis not present

## 2022-06-01 DIAGNOSIS — D869 Sarcoidosis, unspecified: Secondary | ICD-10-CM | POA: Diagnosis not present

## 2022-06-01 DIAGNOSIS — Z794 Long term (current) use of insulin: Secondary | ICD-10-CM

## 2022-06-01 DIAGNOSIS — E1140 Type 2 diabetes mellitus with diabetic neuropathy, unspecified: Secondary | ICD-10-CM | POA: Diagnosis not present

## 2022-06-01 LAB — CBC WITH DIFFERENTIAL/PLATELET
Abs Immature Granulocytes: 0.02 10*3/uL (ref 0.00–0.07)
Basophils Absolute: 0 10*3/uL (ref 0.0–0.1)
Basophils Relative: 1 %
Eosinophils Absolute: 0.2 10*3/uL (ref 0.0–0.5)
Eosinophils Relative: 3 %
HCT: 36.8 % (ref 36.0–46.0)
Hemoglobin: 12 g/dL (ref 12.0–15.0)
Immature Granulocytes: 0 %
Lymphocytes Relative: 22 %
Lymphs Abs: 1.4 10*3/uL (ref 0.7–4.0)
MCH: 28.8 pg (ref 26.0–34.0)
MCHC: 32.6 g/dL (ref 30.0–36.0)
MCV: 88.5 fL (ref 80.0–100.0)
Monocytes Absolute: 0.5 10*3/uL (ref 0.1–1.0)
Monocytes Relative: 8 %
Neutro Abs: 4.2 10*3/uL (ref 1.7–7.7)
Neutrophils Relative %: 66 %
Platelets: 195 10*3/uL (ref 150–400)
RBC: 4.16 MIL/uL (ref 3.87–5.11)
RDW: 14.8 % (ref 11.5–15.5)
WBC: 6.3 10*3/uL (ref 4.0–10.5)
nRBC: 0 % (ref 0.0–0.2)

## 2022-06-01 LAB — COMPREHENSIVE METABOLIC PANEL
ALT: 23 U/L (ref 0–44)
AST: 23 U/L (ref 15–41)
Albumin: 3.5 g/dL (ref 3.5–5.0)
Alkaline Phosphatase: 65 U/L (ref 38–126)
Anion gap: 7 (ref 5–15)
BUN: 30 mg/dL — ABNORMAL HIGH (ref 8–23)
CO2: 25 mmol/L (ref 22–32)
Calcium: 10.6 mg/dL — ABNORMAL HIGH (ref 8.9–10.3)
Chloride: 106 mmol/L (ref 98–111)
Creatinine, Ser: 1.45 mg/dL — ABNORMAL HIGH (ref 0.44–1.00)
GFR, Estimated: 41 mL/min — ABNORMAL LOW (ref >60)
Glucose, Bld: 147 mg/dL — ABNORMAL HIGH (ref 70–99)
Potassium: 4.8 mmol/L (ref 3.5–5.1)
Sodium: 138 mmol/L (ref 135–145)
Total Bilirubin: 0.3 mg/dL (ref 0.3–1.2)
Total Protein: 6.7 g/dL (ref 6.5–8.1)

## 2022-06-01 MED ORDER — METHOTREXATE 2.5 MG PO TABS: 7.5000 mg | ORAL_TABLET | ORAL | 3 refills | Status: DC

## 2022-06-01 NOTE — Progress Notes (Signed)
Subjective:    Patient ID: Renee Bush, female    DOB: [birth year], 61 y.o.   MRN: [id number] Patient Care Team: Miki Kins, FNP as PCP - General (Family Medicine) End, Cristal Deer, MD as PCP - Cardiology (Cardiology)  Chief Complaint  Patient presents with   Follow-up    Sarcoidosis. No SOB, wheezing or cough.     HPI Renee Bush is a 61 year old remote former smoker with minimal smoking exposure who presents for follow-up on the issue of sarcoidosis.  This is a scheduled visit.  She was last seen here on 19 Jan 2022 at that time she had a CT chest ordered.  This was done 08 February 2022 showed no evidence for interstitial lung disease, prominent thyroid enlarged thoracic lymph nodes noted that appear to be increased in size from prior.  She also had porta hepatic lymph nodes in the upper abdomen which were increased from prior exam.  The patient was initially seen on 12 October 2021 for abnormal chest CT with mediastinal and hilar adenopathy.  This was hypermetabolic on PET/CT.  A lymph node biopsy of the right supraclavicular area ensued and it was consistent with sarcoidosis.  Initially was treated with low-dose steroids however, patient has significant diabetes and has had issues with recent diabetic ketoacidosis so steroids are being avoided.  She was seen in the [facility] emergency room on 03 Jan 2022.  She was noted to have hyperglycemia and also noted some AKI and hypercalcemia.  Since that ED visit she has not had any significant complaint.  No cough or sputum production.  No shortness of breath or wheezing.  Glycemic control has been adjusted.  We discussed the difficulty treating the sarcoid with steroids due to her diabetes which is very labile.  She will need a steroid sparing agent.  Does not endorse any other complaint.   TEST/EVENTS  October 04, 2021 Right supraclavicular lymph node biopsy showed nonnecrotizing granulomatous inflammation  September 22, 2021 CT  chest negative for PE, mediastinal and right hilar adenopathy small nodes are present at the porta hepatis,  September 29, 2021 PET scan showed hypermetabolic lymphadenopathy in both hilar regions in the mediastinal and associated hypermetabolic lymph nodes in the right supraclavicular region and left thoracic inlet upper portal lymph nodes in the abdomen show low-level hypermetabolism October 29, 2021 2D echo showed EF at 60-65%, grade 1 diastolic dysfunction, normal pulmonary artery systolic pressure, normal right ventricular size.  October 28, 2021 PFTs showed normal lung function with mild bronchodilator response FEV1 101%, ratio 82, FVC 95%, 10% bronchodilator change, DLCO 104%. February 08, 2022 CT chest: Prominent/enlarged thoracic lymph nodes creased in size from prior exam there is porta hepatic lymph node within upper abdomen which has increased in size from prior exams findings are nonspecific and may be related to the history of sarcoid.  4 mm left solid pulmonary nodule. 13 April 2022 CT abdomen and pelvis: Increased size of the retroperitoneal and porta hepatic lymph nodes of the upper abdomen.    Review of Systems A 10 point review of systems was performed and it is as noted above otherwise negative.  Patient Active Problem List   Diagnosis Date Noted   Hypokalemia 04/17/2022   DKA, type 2 (HCC) 04/16/2022   Hospital discharge follow-up 01/06/2022   Blood in stool 12/28/2021   Persistent dry cough 11/16/2021   Urinary tract infection 11/16/2021   Sarcoidosis 11/12/2021   Palpitations 10/28/2021   SVT (supraventricular tachycardia) 10/28/2021   Shortness  of breath 10/28/2021   Chest pain 10/14/2021   Constipation 07/13/2021   Urinary frequency 07/02/2020   Antibiotic-induced yeast infection 01/09/2020   Urinary tract infection symptoms 12/18/2019   Balance problem 12/18/2019   Abnormal laboratory test result 09/04/2019   Acute sinusitis 08/15/2019   History of allergy 06/11/2019    Family history of colonic polyps    Bilateral edema of lower extremity 05/16/2019   Generalized anxiety disorder 02/07/2019   Anemia 07/05/2018   Abdominal pain 11/09/2017   Nausea 11/09/2017   Insomnia 10/06/2017   GERD (gastroesophageal reflux disease) 10/06/2017   Chronic kidney disease, stage III (moderate) (HCC) 10/06/2017   B12 deficiency 06/09/2016   Hyperlipidemia associated with type 2 diabetes mellitus (HCC) 05/05/2016   Type 2 diabetes mellitus with diabetic neuropathy, with long-term current use of insulin (HCC) 09/10/2015   Essential hypertension 09/10/2015   ETOH abuse 04/18/2015   Fatigue 04/18/2015   Social History   Tobacco Use   Smoking status: Former    Packs/day: 1.00    Years: 5.00    Total pack years: 5.00    Types: Cigarettes    Quit date: 04/17/2005    Years since quitting: 17.1   Smokeless tobacco: Never  Substance Use Topics   Alcohol use: Not Currently    Alcohol/week: 14.0 standard drinks of alcohol    Types: 14 Cans of beer per week    Comment: quit ~2020   Allergies  Allergen Reactions   Etodolac Rash   Penicillin G Rash   Penicillins Rash    Has patient had a PCN reaction causing immediate rash, facial/tongue/throat swelling, SOB or lightheadedness with hypotension: Yes Has patient had a PCN reaction causing severe rash involving mucus membranes or skin necrosis: No Has patient had a PCN reaction that required hospitalization: No Has patient had a PCN reaction occurring within the last 10 years: No If all of the above answers are "NO", then may proceed with Cephalosporin use.   Current Meds  Medication Sig   albuterol (PROVENTIL HFA) 108 (90 Base) MCG/ACT inhaler Inhale 2 puffs into the lungs every 6 (six) hours as needed for wheezing or shortness of breath.   cetirizine (ZYRTEC) 10 MG tablet Take 1 tablet (10 mg total) by mouth daily.   cloNIDine (CATAPRES) 0.1 MG tablet Take 1 tablet (0.1 mg total) by mouth 2 (two) times daily for hot  flashes.   diclofenac (VOLTAREN) 75 MG EC tablet Take 75 mg by mouth 2 (two) times daily.   DULoxetine (CYMBALTA) 30 MG capsule Take 2 capsules (60 mg total) by mouth 2 (two) times daily.   ferrous sulfate (FEROSUL) 325 (65 FE) MG tablet TAKE ONE TABLET BY MOUTH EVERY DAY WITH BREAKFAST.   Finerenone (KERENDIA) 10 MG TABS Take 1 tablet (10 mg) by mouth daily.   gabapentin (NEURONTIN) 400 MG capsule Take one capsule by mouth 2 times a day and 2 capsules at bedtime   hydrOXYzine (ATARAX) 25 MG tablet Take 1 tablet (25 mg total) by mouth 2 (two) times daily as needed.   Insulin Glargine (BASAGLAR KWIKPEN) 100 UNIT/ML INJECT 17 UNITS UNDER THE SKIN ONCE DAILY AT BEDTIME.   insulin lispro (HUMALOG) 100 UNIT/ML injection Inject 0.02 mLs (2 Units total) into the skin 3 (three) times daily before meals. Sliding scale 121-150 = 2 units + 1 unit = 3 units,  151-200 = 2 units + 2 units = 4 units 201- 250 = 2 units + 3 units = 5 units 251 -  300 = 2 units + 4 units = 6 units 301- 350 = 2 units + 5 units = 7 units 351 - 400 = 2 units + 6 units = 8 units > 400 call clinic.   Insulin Pen Needle 32G X 6 MM MISC Use as directed.   Melatonin 10 MG TABS Take 1 tablet by mouth at bedtime.   metFORMIN (GLUCOPHAGE) 500 MG tablet TAKE ONE TABLET BY MOUTH 2 TIMES A DAY   metoCLOPramide (REGLAN) 10 MG tablet Take 1 tablet (10 mg total) by mouth every 6 (six) hours as needed for nausea or vomiting.   montelukast (SINGULAIR) 10 MG tablet Take 1 tablet (10 mg total) by mouth daily.   nitroGLYCERIN (NITROSTAT) 0.4 MG SL tablet DISSOLVE 1 TABLET UNDER TONGUE AS NEEDED FOR CHEST PAIN EVERY 5 MINUTES FOR MAX OF 3 DOSES IN 15 MINUTES. IF NO RELIEF AFTER FIRST DOSE CALL 911.   Omega-3 Fatty Acids (FISH OIL) 1000 MG CAPS Take 1,000 mg by mouth 2 (two) times daily.   omeprazole (PRILOSEC) 20 MG capsule Take 1 capsule (20 mg total) by mouth 2 (two) times daily.   rosuvastatin (CRESTOR) 5 MG tablet TAKE ONE TABLET BY MOUTH ONCE  DAILY   Semaglutide, 1 MG/DOSE, (OZEMPIC, 1 MG/DOSE,) 4 MG/3ML SOPN Inject 1 mg into the skin once a week. Discontinue Trulicity.   senna-docusate (SENOKOT-S) 8.6-50 MG tablet Take 1 tablet by mouth once daily.   traZODone (DESYREL) 150 MG tablet Take 1 tablet (150 mg total) by mouth once nightly at bedtime.   Immunization History  Administered Date(s) Administered   Influenza,inj,Quad PF,6+ Mos 05/15/2019   Influenza-Unspecified 06/04/2015, 06/10/2016, 06/08/2017, 05/25/2018, 06/01/2020, 05/20/2021   PFIZER(Purple Top)SARS-COV-2 Vaccination 12/02/2019, 12/23/2019   Pneumococcal Polysaccharide-23 05/16/2019   Tdap 06/04/2015       Objective:   Physical Exam BP 118/80 (BP Location: Left Arm, Cuff Size: Normal)   Pulse 84   Temp 97.7 F (36.5 C)   Ht 5\' 2"  (1.575 m)   Wt 174 lb 2.2 oz (79 kg)   SpO2 98%   BMI 31.85 kg/m  GENERAL: Obese, well-developed woman, no acute distress.  Fully ambulatory, no conversational dyspnea. HEAD: Normocephalic, atraumatic.  EYES: Pupils equal, round, reactive to light.  No scleral icterus.  MOUTH: Poor dentition, oral mucosa moist.  No thrush. NECK: Supple. No thyromegaly. Trachea midline. No JVD.  No adenopathy. PULMONARY: Good air entry bilaterally.  No adventitious sounds. CARDIOVASCULAR: S1 and S2. Regular rate and rhythm.  No rubs, murmurs or gallops heard. ABDOMEN: Obese otherwise benign. MUSCULOSKELETAL: No joint deformity, no clubbing, no edema.  NEUROLOGIC: No overt focal deficit, no gait disturbance, speech is fluent. SKIN: Intact,warm,dry.  PSYCH: Mood and behavior normal.     Assessment & Plan:     ICD-10-CM   1. Sarcoidosis  D86.9 Angiotensin converting enzyme    Comp Met (CMET)    CBC w/Diff   Start methotrexate Will start at 7.5 mg/week ACE level today    2. Lymphadenopathy, generalized  R59.1 Angiotensin converting enzyme    Comp Met (CMET)   Due to sarcoidosis    3. Type 2 diabetes mellitus with diabetic neuropathy,  with long-term current use of insulin (HCC)  E11.40    Z79.4    Patient needs steroid sparing therapy for sarcoid Diabetes is in poor control Issue adds complexity to her management    4. Medication monitoring encounter  Z51.81 Angiotensin converting enzyme    Comp Met (CMET)   CBC Complete  metabolic panel Monitoring for methotrexate    5. Need for immunization against influenza  Z23 Flu Vaccine QUAD 53mo+IM (Fluarix, Fluzone & Alfiuria Quad PF)   Flu vaccine today     Orders Placed This Encounter  Procedures   CBC w/Diff    Standing Status:   Future    Standing Expiration Date:   12/01/2022   Comp Met (CMET)    Standing Status:   Future    Standing Expiration Date:   12/01/2022   Angiotensin converting enzyme    Standing Status:   Future    Standing Expiration Date:   12/01/2022   Meds ordered this encounter  Medications   methotrexate (RHEUMATREX) 2.5 MG tablet    Sig: Take 3 tablets (7.5 mg total) by mouth once a week.    Dispense:  12 tablet    Refill:  3   She had flu vaccine administered today.  We will see the patient in follow-up in 4 to 6 weeks time she is to contact us prior to that time should any new difficulties arise.  Gailen Shelter, MD Advanced Bronchoscopy PCCM [facility]    *This note was dictated using voice recognition software/Dragon.  Despite best efforts to proofread, errors can occur which can change the meaning. Any transcriptional errors that result from this process are unintentional and may not be fully corrected at the time of dictation.

## 2022-06-01 NOTE — Patient Instructions (Signed)
We are starting you on a medication called methotrexate for your Sarcoid.   The methotrexate dose is 3 TABLETS  ONCE a WEEK.  We are checking blood work to monitor the medicine.  We will see you in follow up in 4-6 weeks time.

## 2022-06-02 LAB — ANGIOTENSIN CONVERTING ENZYME: Angiotensin-Converting Enzyme: 133 U/L — ABNORMAL HIGH (ref 14–82)

## 2022-07-04 NOTE — Progress Notes (Unsigned)
Follow-up Outpatient Visit Date: 07/06/2022  Primary Care Provider: Miki Kins, FNP [address] [place] [address]  Chief Complaint: Lightheadedness and cough  HPI:  Renee Bush is a 61 y.o. female with history of PSVT, hypertension, hyperlipidemia, type 2 diabetes mellitus, GERD, and recently diagnosed sarcoidosis, who presents for follow-up of PSVT and leg swelling.  She was last seen in our office in August by Charlsie Quest, NP, at which time she was feeling well.  Today, Ms. also reports that she has not felt great the last few weeks.  She specifically complains of lightheadedness despite being thirstier than normal and drinking lots of water.  She has noticed mild leg edema as well as a cough.  She denies chest pain, shortness of breath, or palpitations.  She notes that she was started on clonidine in the late summer for treatment of hot flashes.  She was also hospitalized around this time for DKA.  Her only other concern is of cramping in both legs at night.  She denies claudication.  She is also taking methotrexate for sarcoidosis, managed by Dr. Jayme Cloud.  --------------------------------------------------------------------------------------------------  Cardiovascular History & Procedures: Cardiovascular Problems: PSVT   Risk Factors: Hypertension, hyperlipidemia, type 2 diabetes mellitus, coronary artery calcification, and prior tobacco use   Cath/PCI: None   CV Surgery: None   EP Procedures and Devices: None   Non-Invasive Evaluation(s): ABI (03/29/2022): Normal ABI and TBI bilaterally. Limited echo (03/29/2022): Normal LV size and wall thickness.  LVEF 60-65% with grade 2 diastolic dysfunction.  Normal RV size and function with mild pulmonary hypertension (RVSP 35-40 mmHg).  Mild-moderate TR. Coronary CTA (11/25/2021): Mild coronary artery plaquing with less than 25% stenoses in the LMCA, LAD, and RCA.  Coronary calcium score 276 (95th percentile for age and sex  matched controls).  Stable mildly prominent subcarinal lymph node. TTE (10/29/2021): Normal LV size and wall thickness.  LVEF 60-65% with grade 1 diastolic dysfunction.  GLS -19%.  Normal RV size and function with upper normal PA pressure.  Normal biatrial size.  No pericardial effusion.  Mild-moderate tricuspid regurgitation.  Recent CV Pertinent Labs: Lab Results  Component Value Date   CHOL 163 10/06/2021   CHOL 176 03/03/2014   HDL 40 10/06/2021   HDL 46 03/03/2014   LDLCALC 96 10/06/2021   LDLCALC 100 03/03/2014   TRIG 152 (H) 10/06/2021   TRIG 151 03/03/2014   CHOLHDL 4.1 10/06/2021   INR 0.94 06/04/2015   INR 1.0 03/02/2014   BNP 46.8 02/01/2022   K 4.3 07/06/2022   K 3.7 03/02/2014   MG 1.3 (L) 07/06/2022   BUN 28 (H) 07/06/2022   BUN 23 11/08/2021   BUN 16 03/02/2014   CREATININE 1.48 (H) 07/06/2022   CREATININE 0.98 03/02/2014    Past medical and surgical history were reviewed and updated in EPIC.  Current Meds  Medication Sig   albuterol (PROVENTIL HFA) 108 (90 Base) MCG/ACT inhaler Inhale 2 puffs into the lungs every 6 (six) hours as needed for wheezing or shortness of breath.   cetirizine (ZYRTEC) 10 MG tablet Take 1 tablet (10 mg total) by mouth daily.   diclofenac (VOLTAREN) 75 MG EC tablet Take 75 mg by mouth 2 (two) times daily.   DULoxetine (CYMBALTA) 30 MG capsule Take 2 capsules (60 mg total) by mouth 2 (two) times daily.   ferrous sulfate (FEROSUL) 325 (65 FE) MG tablet TAKE ONE TABLET BY MOUTH EVERY DAY WITH BREAKFAST.   Finerenone (KERENDIA) 10 MG TABS  Take 1 tablet (10 mg) by mouth daily.   gabapentin (NEURONTIN) 400 MG capsule Take one capsule by mouth 2 times a day and 2 capsules at bedtime   hydrOXYzine (ATARAX) 25 MG tablet Take 1 tablet (25 mg total) by mouth 2 (two) times daily as needed.   Insulin Glargine (BASAGLAR KWIKPEN) 100 UNIT/ML INJECT 17 UNITS UNDER THE SKIN ONCE DAILY AT BEDTIME.   insulin lispro (HUMALOG) 100 UNIT/ML injection Inject  0.02 mLs (2 Units total) into the skin 3 (three) times daily before meals. Sliding scale 121-150 = 2 units + 1 unit = 3 units,  151-200 = 2 units + 2 units = 4 units 201- 250 = 2 units + 3 units = 5 units 251 - 300 = 2 units + 4 units = 6 units 301- 350 = 2 units + 5 units = 7 units 351 - 400 = 2 units + 6 units = 8 units > 400 call clinic.   Insulin Pen Needle 32G X 6 MM MISC Use as directed.   Melatonin 10 MG TABS Take 1 tablet by mouth at bedtime.   metFORMIN (GLUCOPHAGE) 500 MG tablet TAKE ONE TABLET BY MOUTH 2 TIMES A DAY   methotrexate (RHEUMATREX) 2.5 MG tablet Take 3 tablets (7.5 mg total) by mouth once a week.   metoCLOPramide (REGLAN) 10 MG tablet Take 1 tablet (10 mg total) by mouth every 6 (six) hours as needed for nausea or vomiting.   metoprolol tartrate (LOPRESSOR) 25 MG tablet Take 2 tablets (50 mg total) by mouth 2 (two) times daily.   montelukast (SINGULAIR) 10 MG tablet Take 1 tablet (10 mg total) by mouth daily.   nitroGLYCERIN (NITROSTAT) 0.4 MG SL tablet DISSOLVE 1 TABLET UNDER TONGUE AS NEEDED FOR CHEST PAIN EVERY 5 MINUTES FOR MAX OF 3 DOSES IN 15 MINUTES. IF NO RELIEF AFTER FIRST DOSE CALL 911.   Omega-3 Fatty Acids (FISH OIL) 1000 MG CAPS Take 1,000 mg by mouth 2 (two) times daily.   omeprazole (PRILOSEC) 20 MG capsule Take 1 capsule (20 mg total) by mouth 2 (two) times daily.   rosuvastatin (CRESTOR) 5 MG tablet TAKE ONE TABLET BY MOUTH ONCE DAILY   Semaglutide, 1 MG/DOSE, (OZEMPIC, 1 MG/DOSE,) 4 MG/3ML SOPN Inject 1 mg into the skin once a week. Discontinue Trulicity.   senna-docusate (SENOKOT-S) 8.6-50 MG tablet Take 1 tablet by mouth once daily.   traZODone (DESYREL) 150 MG tablet Take 1 tablet (150 mg total) by mouth once nightly at bedtime.   [DISCONTINUED] cloNIDine (CATAPRES) 0.1 MG tablet Take 1 tablet (0.1 mg total) by mouth 2 (two) times daily for hot flashes.    Allergies: Etodolac, Penicillin g, and Penicillins  Social History   Tobacco Use    Smoking status: Former    Packs/day: 1.00    Years: 5.00    Total pack years: 5.00    Types: Cigarettes    Quit date: 04/17/2005    Years since quitting: 17.2   Smokeless tobacco: Never  Vaping Use   Vaping Use: Never used  Substance Use Topics   Alcohol use: Not Currently    Alcohol/week: 14.0 standard drinks of alcohol    Types: 14 Cans of beer per week    Comment: quit ~2020   Drug use: No    Family History  Problem Relation Age of Onset   COPD Mother    Alzheimer's disease Mother    Hypertension Father    Hyperlipidemia Father    Heart attack  Father    Diabetes type II Father    Lupus Father    Diabetes type II Sister    Diabetes type II Sister    Diabetes type II Sister    Diabetes type II Sister    Gestational diabetes Daughter     Review of Systems: A 12-system review of systems was performed and was negative except as noted in the HPI.  --------------------------------------------------------------------------------------------------  Physical Exam: BP (!) 90/56 (BP Location: Left Arm, Patient Position: Sitting, Cuff Size: Normal)   Pulse 77   Ht 5\' 2"  (1.575 m)   Wt 175 lb (79.4 kg)   SpO2 98%   BMI 32.01 kg/m   General:  NAD. Neck: No JVD or HJR. Lungs: Clear to auscultation bilaterally without wheezes or crackles. Heart: Regular rate and rhythm without murmurs, rubs, or gallops. Abdomen: Soft, nontender, nondistended. Extremities: Trace pretibial edema bilaterally.  2+ pedal pulses bilaterally.  EKG: Sinus rhythm with low voltage and poor R wave progression.  No significant change from prior tracing on 12/10/2021.  Lab Results  Component Value Date   WBC 6.3 06/01/2022   HGB 12.0 06/01/2022   HCT 36.8 06/01/2022   MCV 88.5 06/01/2022   PLT 195 06/01/2022    Lab Results  Component Value Date   NA 135 07/06/2022   K 4.3 07/06/2022   CL 102 07/06/2022   CO2 24 07/06/2022   BUN 28 (H) 07/06/2022   CREATININE 1.48 (H) 07/06/2022   GLUCOSE  163 (H) 07/06/2022   ALT 23 06/01/2022    Lab Results  Component Value Date   CHOL 163 10/06/2021   HDL 40 10/06/2021   LDLCALC 96 10/06/2021   TRIG 152 (H) 10/06/2021   CHOLHDL 4.1 10/06/2021    --------------------------------------------------------------------------------------------------  ASSESSMENT AND PLAN: Hypotension, acute kidney injury, and lightheadedness: I am concerned that clonidine may be contributing to lightheadedness and soft blood pressures.  Dry mouth associate with clonidine could also be driving Ms. Corral's increased thirst.  Certainly her history of DKA is a concern with polydipsia.  Labs drawn last month by Dr. Patsey Berthold were also noted for acute kidney injury with creatinine up to 1.5 from a baseline of 0.8-1.0 in August).  I will check a BMP today.  We will also wean her off clonidine (decrease clonidine to 0.1 mg nightly x3 days, then stop).  If she has ongoing issues with hot flashes, she should speak with her PCP about alternative treatment options.  PSVT: No palpitations reported.  Continue current dose of metoprolol.  Hyperlipidemia associated with type 2 diabetes mellitus: Continue low-dose rosuvastatin.  May need to consider escalation in the future.  Continue ongoing management of DM through PCP.  Sarcoidosis: Continue close follow-up with Dr. Patsey Berthold.  We will need to monitor renal function closely given potential for kidney injury with methotrexate.  As above, we will check a BMP today.  Leg cramps: Symptoms not consistent with claudication.  Pedal pulses seem normal on exam.  I will check BMP and magnesium level today.  Follow-up: Return to clinic in 6 months.  [name], MD 07/07/2022 7:22 AM

## 2022-07-06 ENCOUNTER — Encounter: Payer: Self-pay | Admitting: Internal Medicine

## 2022-07-06 ENCOUNTER — Other Ambulatory Visit
Admission: RE | Admit: 2022-07-06 | Discharge: 2022-07-06 | Disposition: A | Discharge status: Home / Self Care | Payer: Medicaid Other | Source: Ambulatory Visit | Attending: Internal Medicine | Admitting: Internal Medicine

## 2022-07-06 ENCOUNTER — Ambulatory Visit: Payer: Medicaid Other | Attending: Internal Medicine | Admitting: Internal Medicine

## 2022-07-06 VITALS — BP 90/56 | HR 77 | Ht 62.0 in | Wt 175.0 lb

## 2022-07-06 DIAGNOSIS — R252 Cramp and spasm: Secondary | ICD-10-CM | POA: Diagnosis present

## 2022-07-06 DIAGNOSIS — D869 Sarcoidosis, unspecified: Secondary | ICD-10-CM

## 2022-07-06 DIAGNOSIS — E1169 Type 2 diabetes mellitus with other specified complication: Secondary | ICD-10-CM

## 2022-07-06 DIAGNOSIS — I471 Supraventricular tachycardia, unspecified: Secondary | ICD-10-CM | POA: Diagnosis present

## 2022-07-06 DIAGNOSIS — R42 Dizziness and giddiness: Secondary | ICD-10-CM | POA: Diagnosis present

## 2022-07-06 DIAGNOSIS — E785 Hyperlipidemia, unspecified: Secondary | ICD-10-CM | POA: Insufficient documentation

## 2022-07-06 DIAGNOSIS — N179 Acute kidney failure, unspecified: Secondary | ICD-10-CM | POA: Insufficient documentation

## 2022-07-06 DIAGNOSIS — I10 Essential (primary) hypertension: Secondary | ICD-10-CM

## 2022-07-06 DIAGNOSIS — I959 Hypotension, unspecified: Secondary | ICD-10-CM

## 2022-07-06 LAB — BASIC METABOLIC PANEL
Anion gap: 9 (ref 5–15)
BUN: 28 mg/dL — ABNORMAL HIGH (ref 8–23)
CO2: 24 mmol/L (ref 22–32)
Calcium: 8.7 mg/dL — ABNORMAL LOW (ref 8.9–10.3)
Chloride: 102 mmol/L (ref 98–111)
Creatinine, Ser: 1.48 mg/dL — ABNORMAL HIGH (ref 0.44–1.00)
GFR, Estimated: 40 mL/min — ABNORMAL LOW (ref >60)
Glucose, Bld: 163 mg/dL — ABNORMAL HIGH (ref 70–99)
Potassium: 4.3 mmol/L (ref 3.5–5.1)
Sodium: 135 mmol/L (ref 135–145)

## 2022-07-06 LAB — MAGNESIUM: Magnesium: 1.3 mg/dL — ABNORMAL LOW (ref 1.7–2.4)

## 2022-07-06 NOTE — Patient Instructions (Addendum)
Medication Instructions:  DECREASE: Clonidine to 0.1 mg at bedtime for 3 days, then STOP  If you notice your blood pressure consistently greater than 130/80, please call us.    *If you need a refill on your cardiac medications before your next appointment, please call your pharmacy*   Lab Work: Your provider would like for you to have the following labs drawn: (BMP, Mg).   Please go to the [facility] entrance and check in at the front desk.  You do not need an appointment.  They are open from 7am-6 pm.  You do not need to be fasting.  If you have labs (blood work) drawn today and your tests are completely normal, you will receive your results only by: MyChart Message (if you have MyChart) OR A paper copy in the mail If you have any lab test that is abnormal or we need to change your treatment, we will call you to review the results.   Testing/Procedures: None ordered today   Follow-Up: At [facility], you and your health needs are our priority.  As part of our continuing mission to provide you with exceptional heart care, we have created designated Provider Care Teams.  These Care Teams include your primary Cardiologist (physician) and Advanced Practice Providers (APPs -  Physician Assistants and Nurse Practitioners) who all work together to provide you with the care you need, when you need it.  We recommend signing up for the patient portal called "MyChart".  Sign up information is provided on this After Visit Summary.  MyChart is used to connect with patients for Virtual Visits (Telemedicine).  Patients are able to view lab/test results, encounter notes, upcoming appointments, etc.  Non-urgent messages can be sent to your provider as well.   To learn more about what you can do with MyChart, go to [email or web address].    Your next appointment:   6 month(s)  The format for your next appointment:   In Person  Provider:   You may see Yvonne Kendall, MD or one  of the following Advanced Practice Providers on your designated Care Team:   Nicolasa Ducking, NP Eula Listen, PA-C Cadence Fransico Michael, PA-C Charlsie Quest, NP

## 2022-07-07 ENCOUNTER — Telehealth: Payer: Self-pay

## 2022-07-07 ENCOUNTER — Encounter: Payer: Self-pay | Admitting: Internal Medicine

## 2022-07-07 DIAGNOSIS — N179 Acute kidney failure, unspecified: Secondary | ICD-10-CM | POA: Insufficient documentation

## 2022-07-07 HISTORY — DX: Acute kidney failure, unspecified: N17.9

## 2022-07-07 NOTE — Telephone Encounter (Signed)
Called pt to follow up on end of therapy letter sent on 06/23/2022. Pt stated she has made her some appointments and is not of any assistance at this time.

## 2022-07-08 ENCOUNTER — Other Ambulatory Visit: Payer: Self-pay

## 2022-07-08 DIAGNOSIS — R252 Cramp and spasm: Secondary | ICD-10-CM

## 2022-07-08 LAB — HEMOGLOBIN A1C: Hemoglobin A1C: 7.8

## 2022-07-08 LAB — CBC AND DIFFERENTIAL
HCT: 32 — AB (ref 36–46)
Hemoglobin: 10.7 — AB (ref 12.0–16.0)
Neutrophils Absolute: 4.5
Platelets: 162 10*3/uL (ref 150–400)
WBC: 6.4

## 2022-07-08 LAB — CBC: RBC: 3.45 — AB (ref 3.87–5.11)

## 2022-07-08 MED ORDER — MAGNESIUM OXIDE 400 MG PO CAPS: 400.0000 mg | ORAL_CAPSULE | Freq: Two times a day (BID) | ORAL | 0 refills | Status: DC

## 2022-07-11 ENCOUNTER — Encounter: Payer: Self-pay | Admitting: Pulmonary Disease

## 2022-07-11 ENCOUNTER — Ambulatory Visit (INDEPENDENT_AMBULATORY_CARE_PROVIDER_SITE_OTHER): Payer: Medicaid Other | Admitting: Pulmonary Disease

## 2022-07-11 VITALS — BP 122/80 | HR 70 | Temp 96.8°F | Ht 62.0 in | Wt 174.6 lb

## 2022-07-11 DIAGNOSIS — N179 Acute kidney failure, unspecified: Secondary | ICD-10-CM

## 2022-07-11 DIAGNOSIS — E1140 Type 2 diabetes mellitus with diabetic neuropathy, unspecified: Secondary | ICD-10-CM

## 2022-07-11 DIAGNOSIS — R591 Generalized enlarged lymph nodes: Secondary | ICD-10-CM | POA: Diagnosis not present

## 2022-07-11 DIAGNOSIS — J209 Acute bronchitis, unspecified: Secondary | ICD-10-CM | POA: Diagnosis not present

## 2022-07-11 DIAGNOSIS — J683 Other acute and subacute respiratory conditions due to chemicals, gases, fumes and vapors: Secondary | ICD-10-CM

## 2022-07-11 DIAGNOSIS — Z794 Long term (current) use of insulin: Secondary | ICD-10-CM

## 2022-07-11 DIAGNOSIS — D869 Sarcoidosis, unspecified: Secondary | ICD-10-CM | POA: Diagnosis not present

## 2022-07-11 MED ORDER — ALBUTEROL SULFATE HFA 108 (90 BASE) MCG/ACT IN AERS: 2.0000 | INHALATION_SPRAY | Freq: Four times a day (QID) | RESPIRATORY_TRACT | 3 refills | Status: DC | PRN

## 2022-07-11 MED ORDER — SYMBICORT 160-4.5 MCG/ACT IN AERO: 2.0000 | INHALATION_SPRAY | Freq: Two times a day (BID) | RESPIRATORY_TRACT | 12 refills | Status: DC

## 2022-07-11 MED ORDER — AZITHROMYCIN 250 MG PO TABS: ORAL_TABLET | ORAL | 0 refills | Status: AC

## 2022-07-11 NOTE — Patient Instructions (Addendum)
We have sent in a different antibiotic for you.  Please stop the doxycycline (current antibiotic you are taking).  We have sent a refill for your rescue inhaler.  We also are providing you with a new inhaler called Symbicort this is 2 puffs twice a day.  Make sure you rinse your mouth well after you use it.  We will see you in follow-up in 4 to 6 weeks time call sooner should any new problems arise.  We will repeat blood work on follow-up.

## 2022-07-11 NOTE — Progress Notes (Signed)
Subjective:    Patient ID: Renee Bush, female    DOB: 16-Jan-1961, 61 y.o.   MRN: [id number] Patient Care Team: Mechele Claude, FNP as PCP - General (Family Medicine) End, Harrell Gave, MD as PCP - Cardiology (Cardiology)  Chief Complaint  Patient presents with   Follow-up    Sarcoidosis. Cough with green sputum for 2 weeks. No SOB. Wheezing.   HPI Renee Bush is a 61 year old remote former smoker with minimal smoking exposure who presents for follow-up on the issue of sarcoidosis.  This is a scheduled visit.  She was last seen here on 01 June 2022 at that time she was started on low-dose methotrexate.  Methotrexate was started due to evidence of increased retroperitoneal and porta hepatic lymph node involvement.  The patient was initially seen on 12 October 2021 for abnormal chest CT with mediastinal and hilar adenopathy.  This was hypermetabolic on PET/CT.  A lymph node biopsy of the right supraclavicular area ensued and it was consistent with sarcoidosis.  Initially was treated with low-dose steroids however, patient has significant diabetes and has had issues with recent diabetic ketoacidosis so steroids are being avoided.  She has been taking 7.5 mg of methotrexate once a week on Sundays.  She has been tolerating this medication.  She was started at the lower dose due to mild AKI noted.  Repeat basic metabolic panel performed by cardiology on 8 November shows that her renal function on this medication is stable.  Of note,also her mild hypercalcemia resolved on the methotrexate.  Today her only complaint is that she has had cough productive of greenish sputum since around 10 November she was started on doxycycline however she is not tolerating the medication well and feels that its not helping her.  She has not had any fevers, chills or sweats.  He has had wheezing noted.  She has been using her albuterol inhaler 2 times a day due to this.  Fully she does not have to use this at all.   She does not endorse any other complaint.   TEST/EVENTS  October 04, 2021 Right supraclavicular lymph node biopsy showed nonnecrotizing granulomatous inflammation  September 22, 2021 CT chest negative for PE, mediastinal and right hilar adenopathy small nodes are present at the porta hepatis,  September 29, 2021 PET scan showed hypermetabolic lymphadenopathy in both hilar regions in the mediastinal and associated hypermetabolic lymph nodes in the right supraclavicular region and left thoracic inlet upper portal lymph nodes in the abdomen show low-level hypermetabolism October 29, 2021 2D echo showed EF at 14-43%, grade 1 diastolic dysfunction, normal pulmonary artery systolic pressure, normal right ventricular size.  October 28, 2021 PFTs showed normal lung function with mild bronchodilator response FEV1 101%, ratio 82, FVC 95%, 10% bronchodilator change, DLCO 104%. February 08, 2022 CT chest: Prominent/enlarged thoracic lymph nodes creased in size from prior exam there is porta hepatic lymph node within upper abdomen which has increased in size from prior exams findings are nonspecific and may be related to the history of sarcoid.  4 mm left solid pulmonary nodule. 13 April 2022 CT abdomen and pelvis: Increased size of the retroperitoneal and porta hepatic lymph nodes of the upper abdomen. 01 June 2022 ACE level:133 U/L (Ref.14-82 U/L) 01 June 2022: Methotrexate initiated  Review of Systems A 10 point review of systems was performed and it is as noted above otherwise negative.  Patient Active Problem List   Diagnosis Date Noted   AKI (acute kidney injury) ([place]) 07/07/2022  Hypokalemia 04/17/2022   DKA, type 2 ([place]) 04/16/2022   Hospital discharge follow-up 01/06/2022   Blood in stool 12/28/2021   Persistent dry cough 11/16/2021   Urinary tract infection 11/16/2021   Sarcoidosis 11/12/2021   Palpitations 10/28/2021   PSVT (paroxysmal supraventricular tachycardia) 10/28/2021   Shortness of  breath 10/28/2021   Chest pain 10/14/2021   Constipation 07/13/2021   Urinary frequency 07/02/2020   Antibiotic-induced yeast infection 01/09/2020   Urinary tract infection symptoms 12/18/2019   Balance problem 12/18/2019   Abnormal laboratory test result 09/04/2019   Acute sinusitis 08/15/2019   History of allergy 06/11/2019   Family history of colonic polyps    Bilateral edema of lower extremity 05/16/2019   Generalized anxiety disorder 02/07/2019   Anemia 07/05/2018   Abdominal pain 11/09/2017   Nausea 11/09/2017   Insomnia 10/06/2017   GERD (gastroesophageal reflux disease) 10/06/2017   Chronic kidney disease, stage III (moderate) ([place]) 10/06/2017   B12 deficiency 06/09/2016   Hyperlipidemia associated with type 2 diabetes mellitus ([place]) 05/05/2016   Type 2 diabetes mellitus with diabetic neuropathy, with long-term current use of insulin ([place]) 09/10/2015   Essential hypertension 09/10/2015   ETOH abuse 04/18/2015   Fatigue 04/18/2015   Social History   Tobacco Use   Smoking status: Former    Packs/day: 1.00    Years: 5.00    Total pack years: 5.00    Types: Cigarettes    Quit date: 04/17/2005    Years since quitting: 17.2   Smokeless tobacco: Never  Substance Use Topics   Alcohol use: Not Currently    Alcohol/week: 14.0 standard drinks of alcohol    Types: 14 Cans of beer per week    Comment: quit ~2020   Allergies  Allergen Reactions   Etodolac Rash   Penicillin G Rash   Penicillins Rash    Has patient had a PCN reaction causing immediate rash, facial/tongue/throat swelling, SOB or lightheadedness with hypotension: Yes Has patient had a PCN reaction causing severe rash involving mucus membranes or skin necrosis: No Has patient had a PCN reaction that required hospitalization: No Has patient had a PCN reaction occurring within the last 10 years: No If all of the above answers are "NO", then may proceed with Cephalosporin use.   Current Meds  Medication Sig    cetirizine (ZYRTEC) 10 MG tablet Take 1 tablet (10 mg total) by mouth daily.   diclofenac (VOLTAREN) 75 MG EC tablet Take 75 mg by mouth 2 (two) times daily.   doxycycline (VIBRAMYCIN) 100 MG capsule Take 100 mg by mouth 2 (two) times daily.   DULoxetine (CYMBALTA) 30 MG capsule Take 2 capsules (60 mg total) by mouth 2 (two) times daily.   ferrous sulfate (FEROSUL) 325 (65 FE) MG tablet TAKE ONE TABLET BY MOUTH EVERY DAY WITH BREAKFAST.   Finerenone (KERENDIA) 10 MG TABS Take 1 tablet (10 mg) by mouth daily.   gabapentin (NEURONTIN) 400 MG capsule Take one capsule by mouth 2 times a day and 2 capsules at bedtime   hydrOXYzine (ATARAX) 25 MG tablet Take 1 tablet (25 mg total) by mouth 2 (two) times daily as needed.   Insulin Glargine (BASAGLAR KWIKPEN) 100 UNIT/ML INJECT 17 UNITS UNDER THE SKIN ONCE DAILY AT BEDTIME.   insulin lispro (HUMALOG) 100 UNIT/ML injection Inject 0.02 mLs (2 Units total) into the skin 3 (three) times daily before meals. Sliding scale 121-150 = 2 units + 1 unit = 3 units,  151-200 = 2 units + 2  units = 4 units 201- 250 = 2 units + 3 units = 5 units 251 - 300 = 2 units + 4 units = 6 units 301- 350 = 2 units + 5 units = 7 units 351 - 400 = 2 units + 6 units = 8 units > 400 call clinic.   Insulin Pen Needle 32G X 6 MM MISC Use as directed.   Magnesium Oxide 400 MG CAPS Take 1 capsule (400 mg total) by mouth 2 (two) times daily.   metFORMIN (GLUCOPHAGE) 500 MG tablet TAKE ONE TABLET BY MOUTH 2 TIMES A DAY   methotrexate (RHEUMATREX) 2.5 MG tablet Take 3 tablets (7.5 mg total) by mouth once a week.   metoCLOPramide (REGLAN) 10 MG tablet Take 1 tablet (10 mg total) by mouth every 6 (six) hours as needed for nausea or vomiting.   metoprolol tartrate (LOPRESSOR) 25 MG tablet Take 2 tablets (50 mg total) by mouth 2 (two) times daily.   montelukast (SINGULAIR) 10 MG tablet Take 1 tablet (10 mg total) by mouth daily.   nitroGLYCERIN (NITROSTAT) 0.4 MG SL tablet DISSOLVE 1 TABLET  UNDER TONGUE AS NEEDED FOR CHEST PAIN EVERY 5 MINUTES FOR MAX OF 3 DOSES IN 15 MINUTES. IF NO RELIEF AFTER FIRST DOSE CALL 911.   Omega-3 Fatty Acids (FISH OIL) 1000 MG CAPS Take 1,000 mg by mouth 2 (two) times daily.   omeprazole (PRILOSEC) 20 MG capsule Take 1 capsule (20 mg total) by mouth 2 (two) times daily.   rosuvastatin (CRESTOR) 5 MG tablet TAKE ONE TABLET BY MOUTH ONCE DAILY   Semaglutide, 1 MG/DOSE, (OZEMPIC, 1 MG/DOSE,) 4 MG/3ML SOPN Inject 1 mg into the skin once a week. Discontinue Trulicity.   senna-docusate (SENOKOT-S) 8.6-50 MG tablet Take 1 tablet by mouth once daily.   SYMBICORT 160-4.5 MCG/ACT inhaler Inhale 2 puffs into the lungs 2 (two) times daily. Rinse your mouth well after use.   [DISCONTINUED] albuterol (PROVENTIL HFA) 108 (90 Base) MCG/ACT inhaler Inhale 2 puffs into the lungs every 6 (six) hours as needed for wheezing or shortness of breath.   .    Objective:   Physical Exam BP 122/80 (BP Location: Left Arm, Cuff Size: Normal)   Pulse 70   Temp (!) 96.8 F (36 C)   Ht _0  (1.575 m)   Wt 174 lb 9.6 oz (79.2 kg)   SpO2 100%   BMI 31.93 kg/m  GENERAL: Obese, well-developed woman, no acute distress.  Fully ambulatory, no conversational dyspnea. HEAD: Normocephalic, atraumatic.  EYES: Pupils equal, round, reactive to light.  No scleral icterus.  MOUTH: Poor dentition, oral mucosa moist.  No thrush. NECK: Supple. No thyromegaly. Trachea midline. No JVD.  No adenopathy. PULMONARY: Good air entry bilaterally.  Few rhonchi, faint end expiratory wheezes.Marland Kitchen CARDIOVASCULAR: S1 and S2. Regular rate and rhythm.  No rubs, murmurs or gallops heard. ABDOMEN: Obese otherwise benign. MUSCULOSKELETAL: No joint deformity, no clubbing, no edema.  NEUROLOGIC: No overt focal deficit, no gait disturbance, speech is fluent. SKIN: Intact,warm,dry.  PSYCH: Mood and behavior normal.  Recent Results (from the past 2160 hour(s))  Comprehensive metabolic panel     Status: Abnormal    Collection Time: 04/13/22  8:45 PM  Result Value Ref Range   Sodium 138 135 - 145 mmol/L   Potassium 3.9 3.5 - 5.1 mmol/L   Chloride 104 98 - 111 mmol/L   CO2 18 (L) 22 - 32 mmol/L   Glucose, Bld 144 (H) 70 - 99 mg/dL  Comment: Glucose reference range applies only to samples taken after fasting for at least 8 hours.   BUN 26 (H) 8 - 23 mg/dL   Creatinine, Ser 1.35 (H) 0.44 - 1.00 mg/dL   Calcium 9.1 8.9 - 10.3 mg/dL   Total Protein 6.7 6.5 - 8.1 g/dL   Albumin 3.6 3.5 - 5.0 g/dL   AST 16 15 - 41 U/L   ALT 19 0 - 44 U/L   Alkaline Phosphatase 55 38 - 126 U/L   Total Bilirubin 1.2 0.3 - 1.2 mg/dL   GFR, Estimated 45 (L) >60 mL/min    Comment: (NOTE) Calculated using the CKD-EPI Creatinine Equation (2021)    Anion gap 16 (H) 5 - 15    Comment: Performed at [facility], [address] [place], [place] [address]  HIV Antibody (routine testing w rflx)     Status: None   Collection Time: 04/16/22  9:20 PM  Result Value Ref Range   HIV Screen 4th Generation wRfx Non Reactive Non Reactive    Comment: Performed at [facility], [address], [place], [place] [address]  CBC w/Diff     Status: None   Collection Time: 06/01/22  9:37 AM  Result Value Ref Range   WBC 6.3 4.0 - 10.5 K/uL   RBC 4.16 3.87 - 5.11 MIL/uL   Hemoglobin 12.0 12.0 - 15.0 g/dL   HCT 36.8 36.0 - 46.0 %   MCV 88.5 80.0 - 100.0 fL   MCH 28.8 26.0 - 34.0 pg   MCHC 32.6 30.0 - 36.0 g/dL   RDW 14.8 11.5 - 15.5 %   Platelets 195 150 - 400 K/uL   nRBC 0.0 0.0 - 0.2 %   Neutrophils Relative % 66 %   Neutro Abs 4.2 1.7 - 7.7 K/uL   Lymphocytes Relative 22 %   Lymphs Abs 1.4 0.7 - 4.0 K/uL   Monocytes Relative 8 %   Monocytes Absolute 0.5 0.1 - 1.0 K/uL   Eosinophils Relative 3 %   Eosinophils Absolute 0.2 0.0 - 0.5 K/uL   Basophils Relative 1 %   Basophils Absolute 0.0 0.0 - 0.1 K/uL   Immature Granulocytes 0 %   Abs Immature Granulocytes 0.02 0.00 - 0.07 K/uL    Comment: Performed at  [facility], [place], [place] [address]  Comp Met (CMET)     Status: Abnormal   Collection Time: 06/01/22  9:37 AM  Result Value Ref Range   Sodium 138 135 - 145 mmol/L   Potassium 4.8 3.5 - 5.1 mmol/L   Chloride 106 98 - 111 mmol/L   CO2 25 22 - 32 mmol/L   Glucose, Bld 147 (H) 70 - 99 mg/dL    Comment: Glucose reference range applies only to samples taken after fasting for at least 8 hours.   BUN 30 (H) 8 - 23 mg/dL   Creatinine, Ser 1.45 (H) 0.44 - 1.00 mg/dL   Calcium 10.6 (H) 8.9 - 10.3 mg/dL   Total Protein 6.7 6.5 - 8.1 g/dL   Albumin 3.5 3.5 - 5.0 g/dL   AST 23 15 - 41 U/L   ALT 23 0 - 44 U/L   Alkaline Phosphatase 65 38 - 126 U/L   Total Bilirubin 0.3 0.3 - 1.2 mg/dL   GFR, Estimated 41 (L) >60 mL/min    Comment: (NOTE) Calculated using the CKD-EPI Creatinine Equation (2021)    Anion gap 7 5 - 15  Comment: Performed at [facility], [address] [place], [place] [address]  Angiotensin converting enzyme     Status: Abnormal   Collection Time: 06/01/22  9:37 AM  Result Value Ref Range   Angiotensin-Converting Enzyme 133 (H) 14 - 82 U/L    Comment: (NOTE) Performed At: [facility] [place], [place] [id number] Rush Farmer MD [id number]   Magnesium     Status: Abnormal   Collection Time: 07/06/22  4:50 PM  Result Value Ref Range   Magnesium 1.3 (L) 1.7 - 2.4 mg/dL    Comment: Performed at [facility], [address] [place], [place] [address]  Basic metabolic panel     Status: Abnormal   Collection Time: 07/06/22  4:50 PM  Result Value Ref Range   Sodium 135 135 - 145 mmol/L   Potassium 4.3 3.5 - 5.1 mmol/L   Chloride 102 98 - 111 mmol/L   CO2 24 22 - 32 mmol/L   Glucose, Bld 163 (H) 70 - 99 mg/dL    Comment: Glucose reference range applies only to samples taken after fasting for at least 8 hours.   BUN 28 (H) 8 - 23 mg/dL   Creatinine, Ser 1.48 (H) 0.44 - 1.00 mg/dL   Calcium 8.7  (L) 8.9 - 10.3 mg/dL   GFR, Estimated 40 (L) >60 mL/min    Comment: (NOTE) Calculated using the CKD-EPI Creatinine Equation (2021)    Anion gap 9 5 - 15    Comment: Performed at [facility], [address], [place], [place] [address]       Assessment & Plan:     ICD-10-CM   1. Sarcoidosis  D86.9    Continue methotrexate 7.5 mg once a week    2. Acute tracheobronchitis  J20.9    Switch to Azithromycin    3. Lymphadenopathy, generalized  R59.1    Due to sarcoidosis Repeat imaging 3 months    4. 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 AKI (acute kidney injury) ([place])  N17.9    Encouraged to remain well-hydrated Query renal sarcoidosis versus early diabetic nephropathy Continue to monitor    6. Type 2 diabetes mellitus with diabetic neuropathy, with long-term current use of insulin (HCC)  E11.40    Z79.4    Tissue adds complexity to her management Recent issues with diabetic ketoacidosis Need to avoid steroids     Meds ordered this encounter  Medications   SYMBICORT 160-4.5 MCG/ACT inhaler    Sig: Inhale 2 puffs into the lungs 2 (two) times daily. Rinse your mouth well after use.    Dispense:  1 each    Refill:  12   albuterol (PROVENTIL HFA) 108 (90 Base) MCG/ACT inhaler    Sig: Inhale 2 puffs into the lungs every 6 (six) hours as needed for wheezing or shortness of breath.    Dispense:  6.7 g    Refill:  3   azithromycin (ZITHROMAX) 250 MG tablet    Sig: Take 2 tablets (500 mg) on  Day 1,  followed by 1 tablet (250 mg) once daily on Days 2 through 5.    Dispense:  6 each    Refill:  0   Patient is tolerating low-dose methotrexate.  She does have a mild tracheobronchitis currently she has already had her methotrexate dose for the week.  We will switch antibiotics  to Azithromycin.  Patient states that this usually works well for her.  She has evidence of  airways reactivity and we will treat her with Symbicort during this acute phase.  The patient appears to be responding to methotrexate and that her hypercalcemia noted on methotrexate initiating labs has resolved.  Renal function remains stable.  Renal function may be due to renal sarcoidosis versus early diabetic nephropathy.  We will continue to monitor closely.  We will see the patient in follow-up in 4 to 6 weeks time we will obtain labs at that time to follow-up on these issues.  Patient is to contact us prior to her follow-up should any new difficulties arise.   Renold Don, MD Advanced Bronchoscopy PCCM [facility]    *This note was dictated using voice recognition software/Dragon.  Despite best efforts to proofread, errors can occur which can change the meaning. Any transcriptional errors that result from this process are unintentional and may not be fully corrected at the time of dictation.

## 2022-08-09 NOTE — Telephone Encounter (Signed)
Created in error

## 2022-08-25 ENCOUNTER — Other Ambulatory Visit: Payer: Self-pay

## 2022-08-25 ENCOUNTER — Other Ambulatory Visit
Admission: RE | Admit: 2022-08-25 | Discharge: 2022-08-25 | Disposition: A | Discharge status: Home / Self Care | Payer: Medicaid Other | Attending: Internal Medicine | Admitting: Internal Medicine

## 2022-08-25 DIAGNOSIS — R252 Cramp and spasm: Secondary | ICD-10-CM | POA: Diagnosis present

## 2022-08-25 LAB — BASIC METABOLIC PANEL
Anion gap: 7 (ref 5–15)
BUN: 23 mg/dL (ref 8–23)
CO2: 25 mmol/L (ref 22–32)
Calcium: 9 mg/dL (ref 8.9–10.3)
Chloride: 106 mmol/L (ref 98–111)
Creatinine, Ser: 1.04 mg/dL — ABNORMAL HIGH (ref 0.44–1.00)
GFR, Estimated: >60 mL/min (ref >60)
Glucose, Bld: 158 mg/dL — ABNORMAL HIGH (ref 70–99)
Potassium: 4.9 mmol/L (ref 3.5–5.1)
Sodium: 138 mmol/L (ref 135–145)

## 2022-08-25 LAB — MAGNESIUM: Magnesium: 2 mg/dL (ref 1.7–2.4)

## 2022-08-25 MED ORDER — MAGNESIUM OXIDE 400 MG PO CAPS: 400.0000 mg | ORAL_CAPSULE | Freq: Every day | ORAL | 0 refills | Status: AC

## 2022-08-30 ENCOUNTER — Encounter: Payer: Self-pay | Admitting: Pulmonary Disease

## 2022-08-30 ENCOUNTER — Other Ambulatory Visit
Admission: RE | Admit: 2022-08-30 | Discharge: 2022-08-30 | Disposition: A | Discharge status: Home / Self Care | Payer: Medicaid Other | Source: Ambulatory Visit | Attending: Pulmonary Disease | Admitting: Pulmonary Disease

## 2022-08-30 ENCOUNTER — Ambulatory Visit (INDEPENDENT_AMBULATORY_CARE_PROVIDER_SITE_OTHER): Payer: Medicaid Other | Admitting: Pulmonary Disease

## 2022-08-30 VITALS — BP 118/78 | HR 77 | Temp 97.1°F | Ht 62.0 in | Wt 172.6 lb

## 2022-08-30 DIAGNOSIS — Z5181 Encounter for therapeutic drug level monitoring: Secondary | ICD-10-CM | POA: Diagnosis present

## 2022-08-30 DIAGNOSIS — Z794 Long term (current) use of insulin: Secondary | ICD-10-CM

## 2022-08-30 DIAGNOSIS — N179 Acute kidney failure, unspecified: Secondary | ICD-10-CM

## 2022-08-30 DIAGNOSIS — D869 Sarcoidosis, unspecified: Secondary | ICD-10-CM

## 2022-08-30 DIAGNOSIS — R591 Generalized enlarged lymph nodes: Secondary | ICD-10-CM | POA: Diagnosis not present

## 2022-08-30 DIAGNOSIS — E1140 Type 2 diabetes mellitus with diabetic neuropathy, unspecified: Secondary | ICD-10-CM

## 2022-08-30 DIAGNOSIS — J0110 Acute frontal sinusitis, unspecified: Secondary | ICD-10-CM | POA: Diagnosis not present

## 2022-08-30 DIAGNOSIS — J683 Other acute and subacute respiratory conditions due to chemicals, gases, fumes and vapors: Secondary | ICD-10-CM

## 2022-08-30 LAB — COMPREHENSIVE METABOLIC PANEL
ALT: 47 U/L — ABNORMAL HIGH (ref 0–44)
AST: 39 U/L (ref 15–41)
Albumin: 3.4 g/dL — ABNORMAL LOW (ref 3.5–5.0)
Alkaline Phosphatase: 54 U/L (ref 38–126)
Anion gap: 6 (ref 5–15)
BUN: 21 mg/dL (ref 8–23)
CO2: 27 mmol/L (ref 22–32)
Calcium: 9 mg/dL (ref 8.9–10.3)
Chloride: 101 mmol/L (ref 98–111)
Creatinine, Ser: 1.1 mg/dL — ABNORMAL HIGH (ref 0.44–1.00)
GFR, Estimated: 57 mL/min — ABNORMAL LOW (ref >60)
Glucose, Bld: 273 mg/dL — ABNORMAL HIGH (ref 70–99)
Potassium: 4.9 mmol/L (ref 3.5–5.1)
Sodium: 134 mmol/L — ABNORMAL LOW (ref 135–145)
Total Bilirubin: 0.7 mg/dL (ref 0.3–1.2)
Total Protein: 6.1 g/dL — ABNORMAL LOW (ref 6.5–8.1)

## 2022-08-30 LAB — CBC WITH DIFFERENTIAL/PLATELET
Abs Immature Granulocytes: 0.02 10*3/uL (ref 0.00–0.07)
Basophils Absolute: 0 10*3/uL (ref 0.0–0.1)
Basophils Relative: 0 %
Eosinophils Absolute: 0.2 10*3/uL (ref 0.0–0.5)
Eosinophils Relative: 3 %
HCT: 36.4 % (ref 36.0–46.0)
Hemoglobin: 12 g/dL (ref 12.0–15.0)
Immature Granulocytes: 0 %
Lymphocytes Relative: 22 %
Lymphs Abs: 1.1 10*3/uL (ref 0.7–4.0)
MCH: 31.5 pg (ref 26.0–34.0)
MCHC: 33 g/dL (ref 30.0–36.0)
MCV: 95.5 fL (ref 80.0–100.0)
Monocytes Absolute: 0.4 10*3/uL (ref 0.1–1.0)
Monocytes Relative: 8 %
Neutro Abs: 3.6 10*3/uL (ref 1.7–7.7)
Neutrophils Relative %: 67 %
Platelets: 168 10*3/uL (ref 150–400)
RBC: 3.81 MIL/uL — ABNORMAL LOW (ref 3.87–5.11)
RDW: 15.9 % — ABNORMAL HIGH (ref 11.5–15.5)
WBC: 5.3 10*3/uL (ref 4.0–10.5)
nRBC: 0 % (ref 0.0–0.2)

## 2022-08-30 MED ORDER — AZITHROMYCIN 250 MG PO TABS: ORAL_TABLET | ORAL | 0 refills | Status: AC

## 2022-08-30 MED ORDER — METHOTREXATE SODIUM 2.5 MG PO TABS: 7.5000 mg | ORAL_TABLET | ORAL | 6 refills | Status: DC

## 2022-08-30 NOTE — Progress Notes (Signed)
Subjective:    Patient ID: Renee Bush, female    DOB: [birth year], 62 y.o.   MRN: [id number] Patient Care Team: Miki Kins, FNP as PCP - General (Family Medicine) End, Cristal Deer, MD as PCP - Cardiology (Cardiology) Salena Saner, MD as Consulting Physician (Pulmonary Disease)  Chief Complaint  Patient presents with   Follow-up    Doing pretty. No SOB, wheezing or cough. Having congestion and a headache since yesterday.   HPI Renee Bush is a 62 year old remote former smoker (minimal smoking exposure) who presents for follow-up on the issue of sarcoidosis.  This is a scheduled visit.  She was last seen here on 11 July 2022 at that time she was continued on low-dose methotrexate.  Methotrexate was started on 01 June 2022 due to evidence of increased retroperitoneal and porta hepatic lymph node involvement.  The patient was initially seen on 12 October 2021 for abnormal chest CT with mediastinal and hilar adenopathy.  This was hypermetabolic on PET/CT.  A lymph node biopsy of the right supraclavicular area ensued and it was consistent with sarcoidosis. Initially she was treated with low-dose steroids however, she has significant diabetes and has had issues with diabetic ketoacidosis so steroids are being avoided.  She has been taking 7.5 mg of methotrexate once a week on Sundays.  She has been tolerating this medication.  She was started at the lower dose due to mild AKI noted.  Repeat basic metabolic panel performed by cardiology on 8 November shows that her renal function on this medication is stable.  Of note,also her mild hypercalcemia that resolved on the methotrexate.   Today her only complaint is that she has had cough productive of greenish sputum since yesterday she has significant nasal congestion and purulent nasal drainage that she believes started her symptoms.  She has not had any fevers, chills or sweats.  He has not had wheezing noted.  She does not endorse  any other complaint.   TEST/EVENTS  October 04, 2021 Right supraclavicular lymph node biopsy showed nonnecrotizing granulomatous inflammation  September 22, 2021 CT chest negative for PE, mediastinal and right hilar adenopathy small nodes are present at the porta hepatis,  September 29, 2021 PET scan showed hypermetabolic lymphadenopathy in both hilar regions in the mediastinal and associated hypermetabolic lymph nodes in the right supraclavicular region and left thoracic inlet upper portal lymph nodes in the abdomen show low-level hypermetabolism October 29, 2021 2D echo showed EF at 60-65%, grade 1 diastolic dysfunction, normal pulmonary artery systolic pressure, normal right ventricular size.  October 28, 2021 PFTs showed normal lung function with mild bronchodilator response FEV1 101%, ratio 82, FVC 95%, 10% bronchodilator change, DLCO 104%. February 08, 2022 CT chest: Prominent/enlarged thoracic lymph nodes creased in size from prior exam there is porta hepatic lymph node within upper abdomen which has increased in size from prior exams findings are nonspecific and may be related to the history of sarcoid.  4 mm left solid pulmonary nodule. 13 April 2022 CT abdomen and pelvis: Increased size of the retroperitoneal and porta hepatic lymph nodes of the upper abdomen. 01 June 2022 ACE level:133 U/L (Ref.14-82 U/L) 01 June 2022: Methotrexate initiated, 7.5 mg weekly  Review of Systems A 10 point review of systems was performed and it is as noted above otherwise negative.   Patient Active Problem List   Diagnosis Date Noted   AKI (acute kidney injury) (HCC) 07/07/2022   Hypokalemia 04/17/2022   DKA, type 2 (HCC) 04/16/2022  Hospital discharge follow-up 01/06/2022   Blood in stool 12/28/2021   Persistent dry cough 11/16/2021   Urinary tract infection 11/16/2021   Sarcoidosis 11/12/2021   Palpitations 10/28/2021   PSVT (paroxysmal supraventricular tachycardia) 10/28/2021   Shortness of breath  10/28/2021   Chest pain 10/14/2021   Constipation 07/13/2021   Urinary frequency 07/02/2020   Antibiotic-induced yeast infection 01/09/2020   Urinary tract infection symptoms 12/18/2019   Balance problem 12/18/2019   Abnormal laboratory test result 09/04/2019   Acute sinusitis 08/15/2019   History of allergy 06/11/2019   Family history of colonic polyps    Bilateral edema of lower extremity 05/16/2019   Generalized anxiety disorder 02/07/2019   Anemia 07/05/2018   Abdominal pain 11/09/2017   Nausea 11/09/2017   Insomnia 10/06/2017   GERD (gastroesophageal reflux disease) 10/06/2017   Chronic kidney disease, stage III (moderate) ([place]) 10/06/2017   B12 deficiency 06/09/2016   Hyperlipidemia associated with type 2 diabetes mellitus ([place]) 05/05/2016   Type 2 diabetes mellitus with diabetic neuropathy, with long-term current use of insulin ([place]) 09/10/2015   Essential hypertension 09/10/2015   ETOH abuse 04/18/2015   Fatigue 04/18/2015   Social History   Tobacco Use   Smoking status: Former    Packs/day: 1.00    Years: 5.00    Total pack years: 5.00    Types: Cigarettes    Quit date: 04/17/2005    Years since quitting: 17.3   Smokeless tobacco: Never  Substance Use Topics   Alcohol use: Not Currently    Alcohol/week: 14.0 standard drinks of alcohol    Types: 14 Cans of beer per week    Comment: quit ~2020   Allergies  Allergen Reactions   Etodolac Rash   Penicillin G Rash   Penicillins Rash    Has patient had a PCN reaction causing immediate rash, facial/tongue/throat swelling, SOB or lightheadedness with hypotension: Yes Has patient had a PCN reaction causing severe rash involving mucus membranes or skin necrosis: No Has patient had a PCN reaction that required hospitalization: No Has patient had a PCN reaction occurring within the last 10 years: No If all of the above answers are "NO", then may proceed with Cephalosporin use.   Current Meds  Medication Sig    albuterol (PROVENTIL HFA) 108 (90 Base) MCG/ACT inhaler Inhale 2 puffs into the lungs every 6 (six) hours as needed for wheezing or shortness of breath.   cetirizine (ZYRTEC) 10 MG tablet Take 1 tablet (10 mg total) by mouth daily.   diclofenac (VOLTAREN) 75 MG EC tablet Take 75 mg by mouth 2 (two) times daily.   doxycycline (VIBRAMYCIN) 100 MG capsule Take 100 mg by mouth 2 (two) times daily.   DULoxetine (CYMBALTA) 30 MG capsule Take 2 capsules (60 mg total) by mouth 2 (two) times daily.   ferrous sulfate (FEROSUL) 325 (65 FE) MG tablet TAKE ONE TABLET BY MOUTH EVERY DAY WITH BREAKFAST.   Finerenone (KERENDIA) 10 MG TABS Take 1 tablet (10 mg) by mouth daily.   gabapentin (NEURONTIN) 400 MG capsule Take one capsule by mouth 2 times a day and 2 capsules at bedtime   hydrOXYzine (ATARAX) 25 MG tablet Take 1 tablet (25 mg total) by mouth 2 (two) times daily as needed.   Insulin Glargine (BASAGLAR KWIKPEN) 100 UNIT/ML INJECT 17 UNITS UNDER THE SKIN ONCE DAILY AT BEDTIME.   insulin lispro (HUMALOG) 100 UNIT/ML injection Inject 0.02 mLs (2 Units total) into the skin 3 (three) times daily before meals. Sliding scale  121-150 = 2 units + 1 unit = 3 units,  151-200 = 2 units + 2 units = 4 units 201- 250 = 2 units + 3 units = 5 units 251 - 300 = 2 units + 4 units = 6 units 301- 350 = 2 units + 5 units = 7 units 351 - 400 = 2 units + 6 units = 8 units > 400 call clinic.   Insulin Pen Needle 32G X 6 MM MISC Use as directed.   Magnesium Oxide 400 MG CAPS Take 1 capsule (400 mg total) by mouth daily in the afternoon.   Melatonin 10 MG TABS Take 1 tablet by mouth at bedtime.   metFORMIN (GLUCOPHAGE) 500 MG tablet TAKE ONE TABLET BY MOUTH 2 TIMES A DAY   methotrexate (RHEUMATREX) 2.5 MG tablet Take 3 tablets (7.5 mg total) by mouth once a week.   metoCLOPramide (REGLAN) 10 MG tablet Take 1 tablet (10 mg total) by mouth every 6 (six) hours as needed for nausea or vomiting.   metoprolol tartrate (LOPRESSOR) 25  MG tablet Take 2 tablets (50 mg total) by mouth 2 (two) times daily.   montelukast (SINGULAIR) 10 MG tablet Take 1 tablet (10 mg total) by mouth daily.   nitroGLYCERIN (NITROSTAT) 0.4 MG SL tablet DISSOLVE 1 TABLET UNDER TONGUE AS NEEDED FOR CHEST PAIN EVERY 5 MINUTES FOR MAX OF 3 DOSES IN 15 MINUTES. IF NO RELIEF AFTER FIRST DOSE CALL 911.   Omega-3 Fatty Acids (FISH OIL) 1000 MG CAPS Take 1,000 mg by mouth 2 (two) times daily.   omeprazole (PRILOSEC) 20 MG capsule Take 1 capsule (20 mg total) by mouth 2 (two) times daily.   rosuvastatin (CRESTOR) 5 MG tablet TAKE ONE TABLET BY MOUTH ONCE DAILY   Semaglutide, 1 MG/DOSE, (OZEMPIC, 1 MG/DOSE,) 4 MG/3ML SOPN Inject 1 mg into the skin once a week. Discontinue Trulicity.   senna-docusate (SENOKOT-S) 8.6-50 MG tablet Take 1 tablet by mouth once daily.   SYMBICORT 160-4.5 MCG/ACT inhaler Inhale 2 puffs into the lungs 2 (two) times daily. Rinse your mouth well after use.   traZODone (DESYREL) 150 MG tablet Take 1 tablet (150 mg total) by mouth once nightly at bedtime.   Immunization History  Administered Date(s) Administered   Influenza,inj,Quad PF,6+ Mos 05/15/2019, 06/01/2022   Influenza-Unspecified 06/04/2015, 06/10/2016, 06/08/2017, 05/25/2018, 06/01/2020, 05/20/2021   PFIZER(Purple Top)SARS-COV-2 Vaccination 12/02/2019, 12/23/2019   Pneumococcal Polysaccharide-23 05/16/2019   Tdap 06/04/2015      Objective:   Physical Exam BP 118/78 (BP Location: Left Arm, Cuff Size: Normal)   Pulse 77   Temp (!) 97.1 F (36.2 C)   Ht 5\' 2"  (1.575 m)   Wt 172 lb 9.6 oz (78.3 kg)   SpO2 99%   BMI 31.57 kg/m '   SpO2: 99 % O2 Device: None (Room air)  GENERAL: Obese, well-developed woman, no acute distress.  Fully ambulatory, no conversational dyspnea. HEAD: Normocephalic, atraumatic.  Frontal sinus tenderness. EYES: Pupils equal, round, reactive to light.  No scleral icterus. NOSE: Turbinate edema noted, purulent nasal discharge, mild. MOUTH: Poor  dentition, oral mucosa moist.  No thrush. NECK: Supple. No thyromegaly. Trachea midline. No JVD.  No adenopathy. PULMONARY: Good air entry bilaterally.  Few rhonchi, faint end expiratory wheezes. CARDIOVASCULAR: S1 and S2. Regular rate and rhythm.  No rubs, murmurs or gallops heard. ABDOMEN: Obese otherwise benign. MUSCULOSKELETAL: No joint deformity, no clubbing, no edema.  NEUROLOGIC: No overt focal deficit, no gait disturbance, speech is fluent. SKIN: Intact,warm,dry.  PSYCH: Mood and behavior normal.      Assessment & Plan:     ICD-10-CM   1. Sarcoidosis  D86.9 Angiotensin converting enzyme   On methotrexate 7.5 mg/week Monitoring lab work today Patient notes improvement on symptoms Check ACE level    2. Acute non-recurrent frontal sinusitis  J01.10    Azithromycin Z-Pak Nasal hygiene, nasal saline spray    3. AKI (acute kidney injury) ([place])  N17.9    Renal function actually improving on methotrexate Suspect she had sarcoid renal involvement Continue methotrexate    4. Lymphadenopathy, generalized  R59.1    Continue methotrexate for now Due to sarcoidosis Follow-up chest, abdomen and pelvis CT schedule on next visit    5. Reactive airways dysfunction syndrome (HCC)  J68.3    Well compensated on Symbicort Continue Symbicort 160/4.5, 2 inhalations twice a day    6. Type 2 diabetes mellitus with diabetic neuropathy, with long-term current use of insulin (HCC)  E11.40    Z79.4    This issue adds complexity to her management Methotrexate being used as steroids sparing agent    7. Medication monitoring encounter  Z51.81 CBC with Differential/Platelet    Comprehensive metabolic panel   Monitor renal, hepatic and hematopoietic functions On methotrexate     Meds ordered this encounter  Medications   azithromycin (ZITHROMAX) 250 MG tablet    Sig: Take 2 tablets (500 mg) on  Day 1,  followed by 1 tablet (250 mg) once daily on Days 2 through 5.    Dispense:  6 each     Refill:  0   methotrexate (RHEUMATREX) 2.5 MG tablet    Sig: Take 3 tablets (7.5 mg total) by mouth once a week. Caution:Chemotherapy. Protect from light.    Dispense:  12 tablet    Refill:  6   Will see the patient in follow-up in 2 to 3 months time she is to contact us prior to that time should any new problems arise.  Renold Don, MD Advanced Bronchoscopy [facility]    *This note was dictated using voice recognition software/Dragon.  Despite best efforts to proofread, errors can occur which can change the meaning. Any transcriptional errors that result from this process are unintentional and may not be fully corrected at the time of dictation.

## 2022-08-30 NOTE — Patient Instructions (Addendum)
I recommend that you get saline nasal spray for your nose a good brand over-the-counter is a AYR.  You can do this as needed during the day to keep your nasal passages without irritation.  We sent in a Z-Pak to your pharmacy.  Sent the prescription for your methotrexate to the pharmacy.  You are up-to-date on your pneumonia vaccine.  We will revisit this on follow-up visits.  We have sent in a order for blood work to the laboratory please stop by the lab and get the blood drawn.  We will see you in follow-up in 2 to 3 months time call sooner should any new problems arise.

## 2022-08-31 LAB — ANGIOTENSIN CONVERTING ENZYME: Angiotensin-Converting Enzyme: 84 U/L — ABNORMAL HIGH (ref 14–82)

## 2022-10-07 ENCOUNTER — Encounter: Payer: Self-pay | Admitting: Pulmonary Disease

## 2022-10-09 ENCOUNTER — Encounter: Payer: Self-pay | Admitting: Family

## 2022-10-10 ENCOUNTER — Ambulatory Visit: Payer: Medicaid Other | Admitting: Family

## 2022-10-10 ENCOUNTER — Encounter: Payer: Self-pay | Admitting: Family

## 2022-10-10 VITALS — BP 124/78 | HR 91 | Ht 62.0 in | Wt 177.2 lb

## 2022-10-10 DIAGNOSIS — E1140 Type 2 diabetes mellitus with diabetic neuropathy, unspecified: Secondary | ICD-10-CM

## 2022-10-10 DIAGNOSIS — R5383 Other fatigue: Secondary | ICD-10-CM

## 2022-10-10 DIAGNOSIS — F331 Major depressive disorder, recurrent, moderate: Secondary | ICD-10-CM | POA: Insufficient documentation

## 2022-10-10 DIAGNOSIS — N1831 Chronic kidney disease, stage 3a: Secondary | ICD-10-CM

## 2022-10-10 DIAGNOSIS — Z1231 Encounter for screening mammogram for malignant neoplasm of breast: Secondary | ICD-10-CM

## 2022-10-10 DIAGNOSIS — D869 Sarcoidosis, unspecified: Secondary | ICD-10-CM

## 2022-10-10 DIAGNOSIS — I10 Essential (primary) hypertension: Secondary | ICD-10-CM

## 2022-10-10 DIAGNOSIS — E785 Hyperlipidemia, unspecified: Secondary | ICD-10-CM

## 2022-10-10 DIAGNOSIS — Z6832 Body mass index (BMI) 32.0-32.9, adult: Secondary | ICD-10-CM

## 2022-10-10 DIAGNOSIS — E538 Deficiency of other specified B group vitamins: Secondary | ICD-10-CM | POA: Diagnosis not present

## 2022-10-10 DIAGNOSIS — F3341 Major depressive disorder, recurrent, in partial remission: Secondary | ICD-10-CM

## 2022-10-10 DIAGNOSIS — K219 Gastro-esophageal reflux disease without esophagitis: Secondary | ICD-10-CM

## 2022-10-10 DIAGNOSIS — E1169 Type 2 diabetes mellitus with other specified complication: Secondary | ICD-10-CM

## 2022-10-10 DIAGNOSIS — Z794 Long term (current) use of insulin: Secondary | ICD-10-CM

## 2022-10-10 DIAGNOSIS — Z1159 Encounter for screening for other viral diseases: Secondary | ICD-10-CM

## 2022-10-10 DIAGNOSIS — E559 Vitamin D deficiency, unspecified: Secondary | ICD-10-CM

## 2022-10-10 DIAGNOSIS — D509 Iron deficiency anemia, unspecified: Secondary | ICD-10-CM

## 2022-10-10 MED ORDER — DICLOFENAC SODIUM 75 MG PO TBEC: 75.0000 mg | DELAYED_RELEASE_TABLET | Freq: Two times a day (BID) | ORAL | 1 refills | Status: DC

## 2022-10-10 MED ORDER — SYMBICORT 160-4.5 MCG/ACT IN AERO: 2.0000 | INHALATION_SPRAY | Freq: Two times a day (BID) | RESPIRATORY_TRACT | 12 refills | Status: DC

## 2022-10-10 MED ORDER — DULOXETINE HCL 30 MG PO CPEP: ORAL_CAPSULE | Freq: Two times a day (BID) | ORAL | 1 refills | Status: DC

## 2022-10-10 MED ORDER — METHOTREXATE SODIUM 2.5 MG PO TABS: 7.5000 mg | ORAL_TABLET | ORAL | 3 refills | Status: DC

## 2022-10-10 MED ORDER — ACCRUFER 30 MG PO CAPS: 1.0000 | ORAL_CAPSULE | Freq: Two times a day (BID) | ORAL | 4 refills | Status: DC

## 2022-10-10 NOTE — Progress Notes (Signed)
Established Patient Office Visit  Subjective:  Patient ID: Renee Bush, female    DOB: 04-24-1961  Age: 62 y.o. MRN: [id number]  Chief Complaint  Patient presents with   Follow-up    3 month follow up    Patient is here for her 3 month f/u.   She has been feeling well since her last appointment.    Due for labs No other concerns at this time  HM Mammogram         Ordered - MAMMOGRAM (Every 2 Years) Ordered on 10/10/2022   11/27/2019  Done   Only the first 1 history entries have been loaded, but more history  exists.        HM PAP         PAP SMEAR-Modifier (Every 3 Years) Due soon on 11/20/2022   11/20/2019  HPV Aptima component of IGP, Aptima HPV        HM Colonoscopy         COLONOSCOPY (Pts 45-5yr Insurance coverage will need to be confirmed)  (Every 10 Years) Next due on 06/02/2029   06/03/2019  COLONOSCOPY   Only the first 1 history entries have been loaded, but more history  exists.     Health Maintenance reviewed - mammogram ordered, patient to schedule appointment   Diabetes She presents for her follow-up diabetic visit. She has type 2 diabetes mellitus. No MedicAlert identification noted. Her disease course has been improving. There are no hypoglycemic associated symptoms. There are no diabetic associated symptoms. There are no hypoglycemic complications. Symptoms are stable. Diabetic complications include peripheral neuropathy. Risk factors for coronary artery disease include diabetes mellitus, dyslipidemia, hypertension and obesity. Current diabetic treatment includes diet and oral agent (dual therapy). She is compliant with treatment all of the time.     Past Medical History:  Diagnosis Date   AKI (acute kidney injury) ([facility] 07/07/2022   Antibiotic-induced yeast infection 01/09/2020   Balance problem 12/18/2019   Blood in stool 12/28/2021   Chest pain 10/14/2021   Chronic kidney disease, stage III (moderate) ([facility] 10/06/2017    Constipation 07/13/2021   Diabetes mellitus without complication ([facility]    DKA, type 2 ([facility] 04/16/2022   ETOH abuse 04/18/2015   Family history of colonic polyps    GERD (gastroesophageal reflux disease)    History of allergy 06/11/2019   Hospital discharge follow-up 01/06/2022   Hypercholesteremia    Hypertension 09/10/2015   Hypokalemia 04/17/2022   Palpitations 10/28/2021   Persistent dry cough 11/16/2021   Sarcoidosis 10/12/2021   self-reported   Shortness of breath 10/28/2021   Urinary frequency 07/02/2020   Urinary tract infection 11/16/2021    Social History   Socioeconomic History   Marital status: Married    Spouse name: RCarloyn Manner  Number of children: 4   Years of education: Not on file   Highest education level: 11th grade  Occupational History   Occupation: na  Tobacco Use   Smoking status: Former    Packs/day: 1.00    Years: 5.00    Total pack years: 5.00    Types: Cigarettes    Quit date: 04/17/2005    Years since quitting: 17.4   Smokeless tobacco: Never  Vaping Use   Vaping Use: Never used  Substance and Sexual Activity   Alcohol use: Not Currently    Alcohol/week: 14.0 standard drinks of alcohol    Types: 14 Cans of beer per week    Comment: quit ~2020   Drug use:  No   Sexual activity: Not Currently  Other Topics Concern   Not on file  Social History Narrative   Lives in mobile home paid for   Social Determinants of Health   Financial Resource Strain: Low Risk  (05/19/2020)   Overall Financial Resource Strain (CARDIA)    Difficulty of Paying Living Expenses: Not hard at all  Food Insecurity: No Food Insecurity (05/11/2021)   Hunger Vital Sign    Worried About Running Out of Food in the Last Year: Never true    Ran Out of Food in the Last Year: Never true  Transportation Needs: No Transportation Needs (05/11/2021)   PRAPARE - Hydrologist (Medical): No    Lack of Transportation (Non-Medical): No  Physical Activity:  Insufficiently Active (05/19/2020)   Exercise Vital Sign    Days of Exercise per Week: 7 days    Minutes of Exercise per Session: 10 min  Stress: Stress Concern Present (05/28/2020)   [place]    Feeling of Stress : To some extent  Social Connections: Moderately Isolated (05/28/2020)   Social Connection and Isolation Panel [NHANES]    Frequency of Communication with Friends and Family: More than three times a week    Frequency of Social Gatherings with Friends and Family: More than three times a week    Attends Religious Services: Never    Marine scientist or Organizations: No    Attends Archivist Meetings: Never    Marital Status: Married  Human resources officer Violence: Not At Risk (05/19/2020)   Humiliation, Afraid, Rape, and Kick questionnaire    Fear of Current or Ex-Partner: No    Emotionally Abused: No    Physically Abused: No    Sexually Abused: No    Family History  Problem Relation Age of Onset   COPD Mother    Alzheimer's disease Mother    Hypertension Father    Hyperlipidemia Father    Heart attack Father    Diabetes type II Father    Lupus Father    Diabetes type II Sister    Diabetes type II Sister    Diabetes type II Sister    Diabetes type II Sister    Gestational diabetes Daughter     Allergies  Allergen Reactions   Etodolac Rash   Penicillin G Rash   Penicillins Rash    Has patient had a PCN reaction causing immediate rash, facial/tongue/throat swelling, SOB or lightheadedness with hypotension: Yes Has patient had a PCN reaction causing severe rash involving mucus membranes or skin necrosis: No Has patient had a PCN reaction that required hospitalization: No Has patient had a PCN reaction occurring within the last 10 years: No If all of the above answers are "NO", then may proceed with Cephalosporin use.    Review of Systems  HENT:  Positive for hearing loss.   All other  systems reviewed and are negative.      Objective:   BP 124/78   Pulse 91   Ht 5' 2"$  (1.575 m)   Wt 177 lb 3.2 oz (80.4 kg)   SpO2 96%   BMI 32.41 kg/m   Vitals:   10/10/22 0950  BP: 124/78  Pulse: 91  Height: 5' 2"$  (1.575 m)  Weight: 177 lb 3.2 oz (80.4 kg)  SpO2: 96%  BMI (Calculated): 32.4    Physical Exam Vitals and nursing note reviewed.  Constitutional:  Appearance: Normal appearance. She is normal weight.  HENT:     Nose: Nose normal.  Eyes:     Extraocular Movements: Extraocular movements intact.     Pupils: Pupils are equal, round, and reactive to light.  Cardiovascular:     Rate and Rhythm: Normal rate and regular rhythm.  Pulmonary:     Effort: Pulmonary effort is normal.     Breath sounds: Normal breath sounds.  Musculoskeletal:     Cervical back: Normal range of motion.  Skin:    General: Skin is warm.  Neurological:     General: No focal deficit present.     Mental Status: She is alert and oriented to person, place, and time.      No results found for any visits on 10/10/22.  Recent Results (from the past 2160 hour(s))  Magnesium     Status: None   Collection Time: 08/25/22  1:24 PM  Result Value Ref Range   Magnesium 2.0 1.7 - 2.4 mg/dL    Comment: Performed at [facility], [address] [place], [place] [id number]  Basic metabolic panel     Status: Abnormal   Collection Time: 08/25/22  1:24 PM  Result Value Ref Range   Sodium 138 135 - 145 mmol/L   Potassium 4.9 3.5 - 5.1 mmol/L   Chloride 106 98 - 111 mmol/L   CO2 25 22 - 32 mmol/L   Glucose, Bld 158 (H) 70 - 99 mg/dL    Comment: Glucose reference range applies only to samples taken after fasting for at least 8 hours.   BUN 23 8 - 23 mg/dL   Creatinine, Ser 1.04 (H) 0.44 - 1.00 mg/dL   Calcium 9.0 8.9 - 10.3 mg/dL   GFR, Estimated >60 >60 mL/min    Comment: (NOTE) Calculated using the CKD-EPI Creatinine Equation (2021)    Anion gap 7 5 - 15    Comment:  Performed at [facility], [address] [place], [place] [address]  Angiotensin converting enzyme     Status: Abnormal   Collection Time: 08/30/22 10:31 AM  Result Value Ref Range   Angiotensin-Converting Enzyme 84 (H) 14 - 82 U/L    Comment: (NOTE) Performed At: [facility] [place], [place] [id number] Rush Farmer MD [id number]   Comprehensive metabolic panel     Status: Abnormal   Collection Time: 08/30/22 10:31 AM  Result Value Ref Range   Sodium 134 (L) 135 - 145 mmol/L   Potassium 4.9 3.5 - 5.1 mmol/L   Chloride 101 98 - 111 mmol/L   CO2 27 22 - 32 mmol/L   Glucose, Bld 273 (H) 70 - 99 mg/dL    Comment: Glucose reference range applies only to samples taken after fasting for at least 8 hours.   BUN 21 8 - 23 mg/dL   Creatinine, Ser 1.10 (H) 0.44 - 1.00 mg/dL   Calcium 9.0 8.9 - 10.3 mg/dL   Total Protein 6.1 (L) 6.5 - 8.1 g/dL   Albumin 3.4 (L) 3.5 - 5.0 g/dL   AST 39 15 - 41 U/L   ALT 47 (H) 0 - 44 U/L   Alkaline Phosphatase 54 38 - 126 U/L   Total Bilirubin 0.7 0.3 - 1.2 mg/dL   GFR, Estimated 57 (L) >60 mL/min    Comment: (NOTE) Calculated using the CKD-EPI Creatinine Equation (2021)    Anion gap 6 5 - 15    Comment: Performed at [facility], [address] [place], [place] [address]  CBC with Differential/Platelet     Status: Abnormal   Collection Time: 08/30/22 10:31 AM  Result Value Ref Range   WBC 5.3 4.0 - 10.5 K/uL   RBC 3.81 (L) 3.87 - 5.11 MIL/uL   Hemoglobin 12.0 12.0 - 15.0 g/dL   HCT 36.4 36.0 - 46.0 %   MCV 95.5 80.0 - 100.0 fL   MCH 31.5 26.0 - 34.0 pg   MCHC 33.0 30.0 - 36.0 g/dL   RDW 15.9 (H) 11.5 - 15.5 %   Platelets 168 150 - 400 K/uL   nRBC 0.0 0.0 - 0.2 %   Neutrophils Relative % 67 %   Neutro Abs 3.6 1.7 - 7.7 K/uL   Lymphocytes Relative 22 %   Lymphs Abs 1.1 0.7 - 4.0 K/uL   Monocytes Relative 8 %   Monocytes Absolute 0.4 0.1 - 1.0 K/uL   Eosinophils Relative 3 %   Eosinophils  Absolute 0.2 0.0 - 0.5 K/uL   Basophils Relative 0 %   Basophils Absolute 0.0 0.0 - 0.1 K/uL   Immature Granulocytes 0 %   Abs Immature Granulocytes 0.02 0.00 - 0.07 K/uL    Comment: Performed at [facility], [address], [place], [place] [address]      Assessment & Plan:   Problem List Items Addressed This Visit     Type 2 diabetes mellitus with diabetic neuropathy, with long-term current use of insulin ([place]) - Primary    Labs today - will call with results Continue current meds Will adjust therapy as needed based on lab aresults.        Relevant Orders   Hemoglobin A1c   RESOLVED: Fatigue   Relevant Orders   TSH   Essential hypertension    Labs today - will call with results  Continue metoprolol       Hyperlipidemia associated with type 2 diabetes mellitus ([place])    Labs today - will call with results Continue Rosuvastatin RX.         Relevant Orders   Lipid panel   CBC With Differential   CMP14+EGFR   B12 deficiency    Checking B12 levels today Will supplement as needed.        Relevant Orders   Vitamin B12   GERD (gastroesophageal reflux disease)   Chronic kidney disease, stage III (moderate) ([place])    Continue [employer] today - will call with results  Microalbmin done previously - see history.       Sarcoidosis    Will refill her methotrexate.  She has appointments scheduled with Pulm, but needs refill today.        Iron deficiency anemia    Labs today - will call with results Sending RX refills for her accrufer.  Will adjust if needed.       Relevant Medications   ACCRUFER 30 MG CAPS   Other Visit Diagnoses     Vitamin D deficiency       Relevant Orders   VITAMIN D 25 Hydroxy (Vit-D Deficiency, Fractures)   Encounter for screening for viral disease       Hep C screening ordered.   Relevant Orders   Hepatitis C Ab reflex to Quant PCR   Screening mammogram for breast cancer       Mammogram ordered pt will schedule    Relevant Orders   MM DIGITAL SCREENING BILATERAL   Recurrent major depressive disorder, in partial remission ([place])  Relevant Medications   DULoxetine (CYMBALTA) 30 MG capsule   Body mass index (BMI) of 32.0-32.9 in adult           Return in about 3 months (around 01/08/2023).   Total time spent: 30 minutes  Mechele Claude, FNP  10/10/2022

## 2022-10-10 NOTE — Assessment & Plan Note (Signed)
Labs today - will call with results Sending RX refills for her accrufer.  Will adjust if needed.

## 2022-10-10 NOTE — Assessment & Plan Note (Signed)
Continue [employer] today - will call with results  Microalbmin done previously - see history.

## 2022-10-10 NOTE — Assessment & Plan Note (Signed)
Checking B12 levels today Will supplement as needed.

## 2022-10-10 NOTE — Assessment & Plan Note (Signed)
Will refill her methotrexate.  She has appointments scheduled with Pulm, but needs refill today.

## 2022-10-10 NOTE — Assessment & Plan Note (Signed)
Labs today - will call with results  Continue metoprolol

## 2022-10-10 NOTE — Assessment & Plan Note (Signed)
Labs today - will call with results Continue current meds Will adjust therapy as needed based on lab aresults.

## 2022-10-10 NOTE — Assessment & Plan Note (Signed)
Labs today - will call with results Continue Rosuvastatin RX.

## 2022-10-11 LAB — CBC WITH DIFFERENTIAL
Basophils Absolute: 0 10*3/uL (ref 0.0–0.2)
Basos: 0 %
EOS (ABSOLUTE): 0.1 10*3/uL (ref 0.0–0.4)
Eos: 2 %
Hematocrit: 35.1 % (ref 34.0–46.6)
Hemoglobin: 11.3 g/dL (ref 11.1–15.9)
Immature Grans (Abs): 0 10*3/uL (ref 0.0–0.1)
Immature Granulocytes: 0 %
Lymphocytes Absolute: 1.4 10*3/uL (ref 0.7–3.1)
Lymphs: 25 %
MCH: 31.5 pg (ref 26.6–33.0)
MCHC: 32.2 g/dL (ref 31.5–35.7)
MCV: 98 fL — ABNORMAL HIGH (ref 79–97)
Monocytes Absolute: 0.5 10*3/uL (ref 0.1–0.9)
Monocytes: 9 %
Neutrophils Absolute: 3.3 10*3/uL (ref 1.4–7.0)
Neutrophils: 64 %
RBC: 3.59 x10E6/uL — ABNORMAL LOW (ref 3.77–5.28)
RDW: 13.8 % (ref 11.7–15.4)
WBC: 5.3 10*3/uL (ref 3.4–10.8)

## 2022-10-11 LAB — CMP14+EGFR
ALT: 18 IU/L (ref 0–32)
AST: 15 IU/L (ref 0–40)
Albumin/Globulin Ratio: 1.9 (ref 1.2–2.2)
Albumin: 3.6 g/dL — ABNORMAL LOW (ref 3.9–4.9)
Alkaline Phosphatase: 64 IU/L (ref 44–121)
BUN/Creatinine Ratio: 22 (ref 12–28)
BUN: 25 mg/dL (ref 8–27)
Bilirubin Total: <0.2 mg/dL (ref 0.0–1.2)
CO2: 22 mmol/L (ref 20–29)
Calcium: 9.1 mg/dL (ref 8.7–10.3)
Chloride: 101 mmol/L (ref 96–106)
Creatinine, Ser: 1.16 mg/dL — ABNORMAL HIGH (ref 0.57–1.00)
Globulin, Total: 1.9 g/dL (ref 1.5–4.5)
Glucose: 256 mg/dL — ABNORMAL HIGH (ref 70–99)
Potassium: 5 mmol/L (ref 3.5–5.2)
Sodium: 137 mmol/L (ref 134–144)
Total Protein: 5.5 g/dL — ABNORMAL LOW (ref 6.0–8.5)
eGFR: 53 mL/min/{1.73_m2} — ABNORMAL LOW (ref >59)

## 2022-10-11 LAB — LIPID PANEL
Chol/HDL Ratio: 4.5 ratio — ABNORMAL HIGH (ref 0.0–4.4)
Cholesterol, Total: 183 mg/dL (ref 100–199)
HDL: 41 mg/dL (ref >39)
LDL Chol Calc (NIH): 100 mg/dL — ABNORMAL HIGH (ref 0–99)
Triglycerides: 249 mg/dL — ABNORMAL HIGH (ref 0–149)
VLDL Cholesterol Cal: 42 mg/dL — ABNORMAL HIGH (ref 5–40)

## 2022-10-11 LAB — HCV INTERPRETATION

## 2022-10-11 LAB — HEMOGLOBIN A1C
Est. average glucose Bld gHb Est-mCnc: 192 mg/dL
Hgb A1c MFr Bld: 8.3 % — ABNORMAL HIGH (ref 4.8–5.6)

## 2022-10-11 LAB — TSH: TSH: 2.13 u[IU]/mL (ref 0.450–4.500)

## 2022-10-11 LAB — VITAMIN D 25 HYDROXY (VIT D DEFICIENCY, FRACTURES): Vit D, 25-Hydroxy: 19.9 ng/mL — ABNORMAL LOW (ref 30.0–100.0)

## 2022-10-11 LAB — HCV AB W REFLEX TO QUANT PCR: HCV Ab: NONREACTIVE

## 2022-10-19 ENCOUNTER — Encounter: Payer: Self-pay | Admitting: Family

## 2022-10-27 ENCOUNTER — Other Ambulatory Visit: Payer: Self-pay | Admitting: Family

## 2022-10-31 ENCOUNTER — Other Ambulatory Visit: Payer: Self-pay | Admitting: Family

## 2022-10-31 MED ORDER — ACCRUFER 30 MG PO CAPS: 1.0000 | ORAL_CAPSULE | Freq: Two times a day (BID) | ORAL | 4 refills | Status: DC

## 2022-10-31 MED ORDER — ONETOUCH VERIO W/DEVICE KIT: PACK | 0 refills | Status: AC

## 2022-11-03 ENCOUNTER — Other Ambulatory Visit: Payer: Self-pay | Admitting: Family

## 2022-11-24 ENCOUNTER — Encounter: Payer: Self-pay | Admitting: Pulmonary Disease

## 2022-11-24 ENCOUNTER — Ambulatory Visit (INDEPENDENT_AMBULATORY_CARE_PROVIDER_SITE_OTHER): Payer: Medicaid Other | Admitting: Pulmonary Disease

## 2022-11-24 VITALS — BP 118/78 | HR 74 | Temp 97.5°F | Ht 62.0 in | Wt 179.2 lb

## 2022-11-24 DIAGNOSIS — Z5181 Encounter for therapeutic drug level monitoring: Secondary | ICD-10-CM | POA: Diagnosis not present

## 2022-11-24 DIAGNOSIS — J683 Other acute and subacute respiratory conditions due to chemicals, gases, fumes and vapors: Secondary | ICD-10-CM

## 2022-11-24 DIAGNOSIS — D869 Sarcoidosis, unspecified: Secondary | ICD-10-CM

## 2022-11-24 DIAGNOSIS — N183 Chronic kidney disease, stage 3 unspecified: Secondary | ICD-10-CM

## 2022-11-24 NOTE — Patient Instructions (Signed)
Your lungs sounded good today.  We are checking some blood work to make sure you are still doing well with the medication.  Will see you in follow-up in 6 to 8 weeks time call sooner should any new problems arise.

## 2022-11-24 NOTE — Progress Notes (Signed)
Subjective:    Patient ID: Renee Bush, female    DOB: 04/27/1961, 62 y.o.   MRN: [id number] Patient Care Team: Mechele Claude, FNP as PCP - General (Family Medicine) End, Harrell Gave, MD as PCP - Cardiology (Cardiology) Tyler Pita, MD as Consulting Physician (Pulmonary Disease)  Chief Complaint  Patient presents with   Follow-up    Doing Good. No SOB, wheezing or cough.    HPI Renee Bush is a 62 year old remote former smoker (minimal smoking exposure) who presents for follow-up on the issue of sarcoidosis.  This is a scheduled visit.  She was last seen here on 30 August 2022 at that time she was continued on low-dose methotrexate.  Methotrexate was started on 01 June 2022 due to evidence of increased retroperitoneal and porta hepatic lymph node involvement.  The patient was initially seen on 12 October 2021 for abnormal chest CT with mediastinal and hilar adenopathy.  This was hypermetabolic on PET/CT.  A lymph node biopsy of the right supraclavicular area ensued and it was consistent with sarcoidosis. Initially she was treated with low-dose steroids however, she has significant diabetes and has had issues with diabetic ketoacidosis so steroids are being avoided.  She has been taking 7.5 mg of methotrexate once a week on Sundays.  She has been tolerating this medication.  She was started at the lower dose due to mild AKI noted.  Repeat basic metabolic panel performed by cardiology on 10 October 2022 shows that her renal function on this medication is stable.  Of note,also her mild hypercalcemia that resolved on the methotrexate.  Suspect that her renal issues are more related to her diabetes.  She does have issues with reactive airways disease, she is on Symbicort and as needed albuterol.  She does not endorse any recent flares for this issue.  Well-controlled.   She has not had any fevers, chills or sweats.  He has not had wheezing noted.  He has not had any orthopnea,  no paroxysmal nocturnal dyspnea, no lower extremity edema, no calf tenderness.  She has had no cough, sputum production or hemoptysis.  Weight and appetite stable.  She does not endorse any other symptomatology.     TEST/EVENTS  October 04, 2021 Right supraclavicular lymph node biopsy showed nonnecrotizing granulomatous inflammation  September 22, 2021 CT chest negative for PE, mediastinal and right hilar adenopathy small nodes are present at the porta hepatis,  September 29, 2021 PET scan showed hypermetabolic lymphadenopathy in both hilar regions in the mediastinal and associated hypermetabolic lymph nodes in the right supraclavicular region and left thoracic inlet upper portal lymph nodes in the abdomen show low-level hypermetabolism October 29, 2021 2D echo showed EF at [id number], grade 1 diastolic dysfunction, normal pulmonary artery systolic pressure, normal right ventricular size.  October 28, 2021 PFTs showed normal lung function with mild bronchodilator response FEV1 101%, ratio 82, FVC 95%, 10% bronchodilator change, DLCO 104%. February 08, 2022 CT chest: Prominent/enlarged thoracic lymph nodes decreased in size from prior exam there is porta hepatic lymph node within upper abdomen which has increased in size from prior exams findings are nonspecific and may be related to the history of sarcoid.  4 mm left solid pulmonary nodule. 13 April 2022 CT abdomen and pelvis: Increased size of the retroperitoneal and porta hepatic lymph nodes of the upper abdomen. 01 June 2022 ACE level:133 U/L (Ref.14-82 U/L) 01 June 2022: Methotrexate initiated, 7.5 mg weekly 30 August 2022 ACE level: 84 U/L    Review  of Systems A 10 point review of systems was performed and it is as noted above otherwise negative.  Patient Active Problem List   Diagnosis Date Noted   Iron deficiency anemia 10/10/2022   Moderate episode of recurrent major depressive disorder ([place]) 10/10/2022   Sarcoidosis 11/12/2021   PSVT (paroxysmal  supraventricular tachycardia) 10/28/2021   Bilateral edema of lower extremity 05/16/2019   Generalized anxiety disorder 02/07/2019   Insomnia 10/06/2017   GERD (gastroesophageal reflux disease) 10/06/2017   Chronic kidney disease, stage III (moderate) ([place]) 10/06/2017   B12 deficiency 06/09/2016   Hyperlipidemia associated with type 2 diabetes mellitus ([place]) 05/05/2016   Type 2 diabetes mellitus with diabetic neuropathy, with long-term current use of insulin ([place]) 09/10/2015   Essential hypertension 09/10/2015   Social History   Tobacco Use   Smoking status: Former    Packs/day: 1.00    Years: 5.00    Additional pack years: 0.00    Total pack years: 5.00    Types: Cigarettes    Quit date: 04/17/2005    Years since quitting: 17.6   Smokeless tobacco: Never  Substance Use Topics   Alcohol use: Not Currently    Alcohol/week: 14.0 standard drinks of alcohol    Types: 14 Cans of beer per week    Comment: quit ~2020   Allergies  Allergen Reactions   Etodolac Rash   Penicillin G Rash   Penicillins Rash    Has patient had a PCN reaction causing immediate rash, facial/tongue/throat swelling, SOB or lightheadedness with hypotension: Yes Has patient had a PCN reaction causing severe rash involving mucus membranes or skin necrosis: No Has patient had a PCN reaction that required hospitalization: No Has patient had a PCN reaction occurring within the last 10 years: No If all of the above answers are "NO", then may proceed with Cephalosporin use.   Current Meds  Medication Sig   ACCRUFER 30 MG CAPS Take 1 capsule (30 mg total) by mouth 2 (two) times daily.   albuterol (PROVENTIL HFA) 108 (90 Base) MCG/ACT inhaler Inhale 2 puffs into the lungs every 6 (six) hours as needed for wheezing or shortness of breath.   Blood Glucose Monitoring Suppl (ONETOUCH VERIO) w/Device KIT Use to check glucose up to twice daily   cetirizine (ZYRTEC) 10 MG tablet Take 1 tablet (10 mg total) by mouth daily.    diclofenac (VOLTAREN) 75 MG EC tablet Take 1 tablet (75 mg total) by mouth 2 (two) times daily.   DULoxetine (CYMBALTA) 30 MG capsule Take 2 capsules (60 mg total) by mouth 2 (two) times daily.   DULoxetine (CYMBALTA) 60 MG capsule TAKE ONE CAPSULE BY MOUTH TWICE DAILY   Finerenone (KERENDIA) 10 MG TABS TAKE 1 TABLET BY MOUTH EVERY DAY   gabapentin (NEURONTIN) 400 MG capsule TAKE 1 CAPSULE BY MOUTH TWICE DAILY AND 2 CAPSULES BY MOUTH AT BEDTIME   Insulin Glargine (BASAGLAR KWIKPEN) 100 UNIT/ML INJECT 17 UNITS UNDER THE SKIN ONCE DAILY AT BEDTIME.   insulin lispro (HUMALOG) 100 UNIT/ML injection Inject 0.02 mLs (2 Units total) into the skin 3 (three) times daily before meals. Sliding scale 121-150 = 2 units + 1 unit = 3 units,  151-200 = 2 units + 2 units = 4 units 201- 250 = 2 units + 3 units = 5 units 251 - 300 = 2 units + 4 units = 6 units 301- 350 = 2 units + 5 units = 7 units 351 - 400 = 2 units + 6 units =  8 units > 400 call clinic.   Insulin Pen Needle 32G X 6 MM MISC Use as directed.   Magnesium Oxide 400 MG CAPS Take 1 capsule (400 mg total) by mouth daily in the afternoon.   Melatonin 10 MG TABS Take 1 tablet by mouth at bedtime.   metFORMIN (GLUCOPHAGE) 500 MG tablet TAKE ONE TABLET BY MOUTH 2 TIMES A DAY   methotrexate (RHEUMATREX) 2.5 MG tablet Take 3 tablets (7.5 mg total) by mouth once a week. Caution:Chemotherapy. Protect from light.   metoCLOPramide (REGLAN) 10 MG tablet Take 1 tablet (10 mg total) by mouth every 6 (six) hours as needed for nausea or vomiting.   metoprolol tartrate (LOPRESSOR) 25 MG tablet TAKE 2 TABLETS BY MOUTH TWICE DAILY   montelukast (SINGULAIR) 10 MG tablet Take 1 tablet (10 mg total) by mouth daily.   Omega-3 Fatty Acids (FISH OIL) 1000 MG CAPS Take 1,000 mg by mouth 2 (two) times daily.   omeprazole (PRILOSEC) 20 MG capsule Take 1 capsule (20 mg total) by mouth 2 (two) times daily.   rosuvastatin (CRESTOR) 5 MG tablet TAKE ONE TABLET BY MOUTH ONCE  DAILY   Semaglutide, 1 MG/DOSE, (OZEMPIC, 1 MG/DOSE,) 4 MG/3ML SOPN Inject 1 mg into the skin once a week. Discontinue Trulicity.   SYMBICORT 160-4.5 MCG/ACT inhaler Inhale 2 puffs into the lungs 2 (two) times daily. Rinse your mouth well after use.   Immunization History  Administered Date(s) Administered   Influenza,inj,Quad PF,6+ Mos 05/15/2019, 06/01/2022   Influenza-Unspecified 06/04/2015, 06/10/2016, 06/08/2017, 05/25/2018, 06/01/2020, 05/20/2021   PFIZER(Purple Top)SARS-COV-2 Vaccination 12/02/2019, 12/23/2019   Pneumococcal Polysaccharide-23 05/16/2019   Tdap 06/04/2015       Objective:   Physical Exam BP 118/78 (BP Location: Left Arm, Cuff Size: Normal)   Pulse 74   Temp (!) 97.5 F (36.4 C)   Ht 5\' 2"  (1.575 m)   Wt 179 lb 3.2 oz (81.3 kg)   SpO2 99%   BMI 32.78 kg/m   SpO2: 99 % O2 Device: None (Room air)  GENERAL: Obese, well-developed woman, no acute distress.  Fully ambulatory, no conversational dyspnea. HEAD: Normocephalic, atraumatic.  Frontal sinus tenderness. EYES: Pupils equal, round, reactive to light.  No scleral icterus. NOSE: Turbinate edema noted, purulent nasal discharge, mild. MOUTH: Poor dentition, oral mucosa moist.  No thrush. NECK: Supple. No thyromegaly. Trachea midline. No JVD.  No adenopathy. PULMONARY: Good air entry bilaterally.  No adventitious sounds. CARDIOVASCULAR: S1 and S2. Regular rate and rhythm.  No rubs, murmurs or gallops heard. ABDOMEN: Obese otherwise benign. MUSCULOSKELETAL: No joint deformity, no clubbing, no edema.  NEUROLOGIC: No overt focal deficit, no gait disturbance, speech is fluent. SKIN: Intact,warm,dry.  PSYCH: Mood and behavior normal.     Assessment & Plan:     ICD-10-CM   1. Sarcoidosis  D86.9 Angiotensin converting enzyme   Continue methotrexate at 7.5 mg q. weekly Check ACE level    2. Reactive airways dysfunction syndrome (HCC)  J68.3    Continue Symbicort Continue as needed albuterol    3. Stage  3 chronic kidney disease, unspecified whether stage 3a or 3b CKD (HCC)  N18.30    Continue to monitor carefully Likely complication from diabetes    4. Medication monitoring encounter  Z51.81 CBC    Comp Met (CMET)    CANCELED: Comp Met (CMET)    CANCELED: CBC   Obtain CBC and complete metabolic panel Medication monitoring     Orders Placed This Encounter  Procedures   Angiotensin  converting enzyme    Standing Status:   Future    Standing Expiration Date:   11/24/2023   CBC    Standing Status:   Future    Standing Expiration Date:   11/24/2023   Comp Met (CMET)    Standing Status:   Future    Standing Expiration Date:   11/24/2023   Will see the patient in follow-up in 6 to 8 weeks time call sooner should any new problems arise.  Renold Don, MD Advanced Bronchoscopy PCCM [facility]    *This note was dictated using voice recognition software/Dragon.  Despite best efforts to proofread, errors can occur which can change the meaning. Any transcriptional errors that result from this process are unintentional and may not be fully corrected at the time of dictation.

## 2022-11-25 ENCOUNTER — Other Ambulatory Visit
Admission: RE | Admit: 2022-11-25 | Discharge: 2022-11-25 | Disposition: A | Discharge status: Home / Self Care | Payer: Medicaid Other | Attending: Pulmonary Disease | Admitting: Pulmonary Disease

## 2022-11-25 DIAGNOSIS — Z5181 Encounter for therapeutic drug level monitoring: Secondary | ICD-10-CM | POA: Insufficient documentation

## 2022-11-25 DIAGNOSIS — D869 Sarcoidosis, unspecified: Secondary | ICD-10-CM | POA: Insufficient documentation

## 2022-11-25 LAB — CBC
HCT: 32.9 % — ABNORMAL LOW (ref 36.0–46.0)
Hemoglobin: 10.8 g/dL — ABNORMAL LOW (ref 12.0–15.0)
MCH: 30.9 pg (ref 26.0–34.0)
MCHC: 32.8 g/dL (ref 30.0–36.0)
MCV: 94.3 fL (ref 80.0–100.0)
Platelets: 166 10*3/uL (ref 150–400)
RBC: 3.49 MIL/uL — ABNORMAL LOW (ref 3.87–5.11)
RDW: 14.1 % (ref 11.5–15.5)
WBC: 5.9 10*3/uL (ref 4.0–10.5)
nRBC: 0 % (ref 0.0–0.2)

## 2022-11-25 LAB — COMPREHENSIVE METABOLIC PANEL
ALT: 71 U/L — ABNORMAL HIGH (ref 0–44)
AST: 37 U/L (ref 15–41)
Albumin: 3.3 g/dL — ABNORMAL LOW (ref 3.5–5.0)
Alkaline Phosphatase: 55 U/L (ref 38–126)
Anion gap: 5 (ref 5–15)
BUN: 26 mg/dL — ABNORMAL HIGH (ref 8–23)
CO2: 28 mmol/L (ref 22–32)
Calcium: 9.2 mg/dL (ref 8.9–10.3)
Chloride: 103 mmol/L (ref 98–111)
Creatinine, Ser: 1.09 mg/dL — ABNORMAL HIGH (ref 0.44–1.00)
GFR, Estimated: 57 mL/min — ABNORMAL LOW (ref >60)
Glucose, Bld: 152 mg/dL — ABNORMAL HIGH (ref 70–99)
Potassium: 4.3 mmol/L (ref 3.5–5.1)
Sodium: 136 mmol/L (ref 135–145)
Total Bilirubin: 0.6 mg/dL (ref 0.3–1.2)
Total Protein: 6 g/dL — ABNORMAL LOW (ref 6.5–8.1)

## 2022-11-25 NOTE — Addendum Note (Signed)
Addended by: Antonieta Iba C on: 11/25/2022 10:47 AM   Modules accepted: Orders

## 2022-11-26 LAB — ANGIOTENSIN CONVERTING ENZYME: Angiotensin-Converting Enzyme: 79 U/L (ref 14–82)

## 2022-12-08 ENCOUNTER — Other Ambulatory Visit: Payer: Self-pay | Admitting: Family

## 2022-12-09 ENCOUNTER — Other Ambulatory Visit: Payer: Self-pay

## 2022-12-09 DIAGNOSIS — D869 Sarcoidosis, unspecified: Secondary | ICD-10-CM

## 2022-12-19 ENCOUNTER — Encounter: Payer: Self-pay | Admitting: Internal Medicine

## 2023-01-02 ENCOUNTER — Other Ambulatory Visit: Payer: Self-pay | Admitting: Family

## 2023-01-02 MED ORDER — GABAPENTIN 400 MG PO CAPS: ORAL_CAPSULE | ORAL | 1 refills | Status: DC

## 2023-01-02 NOTE — Telephone Encounter (Signed)
Patient called in needing refill on gabapentin.   [facility] [address]

## 2023-01-03 ENCOUNTER — Other Ambulatory Visit: Payer: Self-pay | Admitting: Family

## 2023-01-03 DIAGNOSIS — Z794 Long term (current) use of insulin: Secondary | ICD-10-CM

## 2023-01-04 ENCOUNTER — Ambulatory Visit: Payer: Medicaid Other | Admitting: Internal Medicine

## 2023-01-05 ENCOUNTER — Ambulatory Visit: Payer: Medicaid Other | Admitting: Pulmonary Disease

## 2023-01-05 ENCOUNTER — Other Ambulatory Visit
Admission: RE | Admit: 2023-01-05 | Discharge: 2023-01-05 | Disposition: A | Discharge status: Home / Self Care | Payer: Medicaid Other | Source: Ambulatory Visit | Attending: Pulmonary Disease | Admitting: Pulmonary Disease

## 2023-01-05 ENCOUNTER — Encounter: Payer: Self-pay | Admitting: Pulmonary Disease

## 2023-01-05 VITALS — BP 110/62 | HR 77 | Temp 98.3°F | Ht 62.0 in | Wt 174.4 lb

## 2023-01-05 DIAGNOSIS — D869 Sarcoidosis, unspecified: Secondary | ICD-10-CM

## 2023-01-05 DIAGNOSIS — Z5181 Encounter for therapeutic drug level monitoring: Secondary | ICD-10-CM | POA: Diagnosis not present

## 2023-01-05 DIAGNOSIS — J683 Other acute and subacute respiratory conditions due to chemicals, gases, fumes and vapors: Secondary | ICD-10-CM

## 2023-01-05 DIAGNOSIS — N183 Chronic kidney disease, stage 3 unspecified: Secondary | ICD-10-CM

## 2023-01-05 DIAGNOSIS — R591 Generalized enlarged lymph nodes: Secondary | ICD-10-CM | POA: Diagnosis present

## 2023-01-05 LAB — CBC WITH DIFFERENTIAL/PLATELET
Abs Immature Granulocytes: 0.01 10*3/uL (ref 0.00–0.07)
Basophils Absolute: 0 10*3/uL (ref 0.0–0.1)
Basophils Relative: 0 %
Eosinophils Absolute: 0.2 10*3/uL (ref 0.0–0.5)
Eosinophils Relative: 3 %
HCT: 30.3 % — ABNORMAL LOW (ref 36.0–46.0)
Hemoglobin: 9.8 g/dL — ABNORMAL LOW (ref 12.0–15.0)
Immature Granulocytes: 0 %
Lymphocytes Relative: 20 %
Lymphs Abs: 1.4 10*3/uL (ref 0.7–4.0)
MCH: 29.9 pg (ref 26.0–34.0)
MCHC: 32.3 g/dL (ref 30.0–36.0)
MCV: 92.4 fL (ref 80.0–100.0)
Monocytes Absolute: 0.3 10*3/uL (ref 0.1–1.0)
Monocytes Relative: 5 %
Neutro Abs: 5 10*3/uL (ref 1.7–7.7)
Neutrophils Relative %: 72 %
Platelets: 175 10*3/uL (ref 150–400)
RBC: 3.28 MIL/uL — ABNORMAL LOW (ref 3.87–5.11)
RDW: 15 % (ref 11.5–15.5)
WBC: 6.9 10*3/uL (ref 4.0–10.5)
nRBC: 0 % (ref 0.0–0.2)

## 2023-01-05 LAB — COMPREHENSIVE METABOLIC PANEL
ALT: 52 U/L — ABNORMAL HIGH (ref 0–44)
AST: 38 U/L (ref 15–41)
Albumin: 3.3 g/dL — ABNORMAL LOW (ref 3.5–5.0)
Alkaline Phosphatase: 62 U/L (ref 38–126)
Anion gap: 6 (ref 5–15)
BUN: 22 mg/dL (ref 8–23)
CO2: 27 mmol/L (ref 22–32)
Calcium: 8.7 mg/dL — ABNORMAL LOW (ref 8.9–10.3)
Chloride: 104 mmol/L (ref 98–111)
Creatinine, Ser: 0.96 mg/dL (ref 0.44–1.00)
GFR, Estimated: >60 mL/min (ref >60)
Glucose, Bld: 116 mg/dL — ABNORMAL HIGH (ref 70–99)
Potassium: 4.7 mmol/L (ref 3.5–5.1)
Sodium: 137 mmol/L (ref 135–145)
Total Bilirubin: 0.7 mg/dL (ref 0.3–1.2)
Total Protein: 5.8 g/dL — ABNORMAL LOW (ref 6.5–8.1)

## 2023-01-05 NOTE — Progress Notes (Signed)
Subjective:    Patient ID: Renee Bush, female    DOB: [birth year], 62 y.o.   MRN: [id number] Patient Care Team: Miki Kins, FNP as PCP - General (Family Medicine) End, Cristal Deer, MD as PCP - Cardiology (Cardiology) Salena Saner, MD as Consulting Physician (Pulmonary Disease)  Chief Complaint  Patient presents with   Follow-up    Doing Good! No SOB, wheezing or cough.    HPI Keela is a 62 year old remote former smoker (minimal smoking exposure) who presents for follow-up on the issue of sarcoidosis.  This is a scheduled visit.  She was last seen here on 30 August 2022 at that time she was continued on low-dose methotrexate.  Methotrexate was started on 01 June 2022 due to evidence of increased retroperitoneal and porta hepatic lymph node involvement.  The patient was initially seen by me on 12 October 2021 for abnormal chest CT with mediastinal and hilar adenopathy.  This was hypermetabolic on PET/CT.  A lymph node biopsy of the right supraclavicular area ensued and it was consistent with sarcoidosis. Initially she was treated with low-dose steroids however, she has significant diabetes and has had issues with diabetic ketoacidosis so steroids are being avoided.  She has been taking 7.5 mg of methotrexate once a week on Sundays, this was started on 01 June 2022.  She has been tolerating this medication.  She was started at the lower dose due to mild AKI noted.  Repeat basic metabolic panel performed by cardiology on 8 November shows that her renal function on this medication is stable.  Of note, the patient also had hypercalcemia that resolved on the methotrexate.   She has been doing well, she has had no dyspnea, wheezing or cough.  She has not had any fevers, chills or sweats. She does not endorse any other complaint.   TEST/EVENTS  October 04, 2021 Right supraclavicular lymph node biopsy showed nonnecrotizing granulomatous inflammation  September 22, 2021 CT  chest negative for PE, mediastinal and right hilar adenopathy small nodes are present at the porta hepatis,  September 29, 2021 PET scan showed hypermetabolic lymphadenopathy in both hilar regions in the mediastinal and associated hypermetabolic lymph nodes in the right supraclavicular region and left thoracic inlet upper portal lymph nodes in the abdomen show low-level hypermetabolism October 29, 2021 2D echo showed EF at 60-65%, grade 1 diastolic dysfunction, normal pulmonary artery systolic pressure, normal right ventricular size.  October 28, 2021 PFTs showed normal lung function with mild bronchodilator response FEV1 101%, ratio 82, FVC 95%, 10% bronchodilator change, DLCO 104%. February 08, 2022 CT chest: Prominent/enlarged thoracic lymph nodes creased in size from prior exam there is porta hepatic lymph node within upper abdomen which has increased in size from prior exams findings are nonspecific and may be related to the history of sarcoid.  4 mm left solid pulmonary nodule. 13 April 2022 CT abdomen and pelvis: Increased size of the retroperitoneal and porta hepatic lymph nodes of the upper abdomen. 01 June 2022 ACE level:133 U/L (Ref.14-82 U/L) 01 June 2022: Methotrexate initiated, 7.5 mg weekly  Review of Systems A 10 point review of systems was performed and it is as noted above otherwise negative.  Patient Active Problem List   Diagnosis Date Noted   Iron deficiency anemia 10/10/2022   Moderate episode of recurrent major depressive disorder (HCC) 10/10/2022   Sarcoidosis 11/12/2021   PSVT (paroxysmal supraventricular tachycardia) 10/28/2021   Bilateral edema of lower extremity 05/16/2019   Generalized anxiety disorder 02/07/2019  Insomnia 10/06/2017   GERD (gastroesophageal reflux disease) 10/06/2017   Chronic kidney disease, stage III (moderate) (HCC) 10/06/2017   B12 deficiency 06/09/2016   Hyperlipidemia associated with type 2 diabetes mellitus (HCC) 05/05/2016   Type 2 diabetes  mellitus with diabetic neuropathy, with long-term current use of insulin (HCC) 09/10/2015   Essential hypertension 09/10/2015   Social History   Tobacco Use   Smoking status: Former    Packs/day: 1.00    Years: 5.00    Additional pack years: 0.00    Total pack years: 5.00    Types: Cigarettes    Quit date: 04/17/2005    Years since quitting: 17.7   Smokeless tobacco: Never  Substance Use Topics   Alcohol use: Not Currently    Alcohol/week: 14.0 standard drinks of alcohol    Types: 14 Cans of beer per week    Comment: quit ~2020   Allergies  Allergen Reactions   Etodolac Rash   Penicillin G Rash   Penicillins Rash    Has patient had a PCN reaction causing immediate rash, facial/tongue/throat swelling, SOB or lightheadedness with hypotension: Yes Has patient had a PCN reaction causing severe rash involving mucus membranes or skin necrosis: No Has patient had a PCN reaction that required hospitalization: No Has patient had a PCN reaction occurring within the last 10 years: No If all of the above answers are "NO", then may proceed with Cephalosporin use.   Current Meds  Medication Sig   ACCRUFER 30 MG CAPS Take 1 capsule (30 mg total) by mouth 2 (two) times daily.   albuterol (PROVENTIL HFA) 108 (90 Base) MCG/ACT inhaler Inhale 2 puffs into the lungs every 6 (six) hours as needed for wheezing or shortness of breath.   Blood Glucose Monitoring Suppl (ONETOUCH VERIO) w/Device KIT Use to check glucose up to twice daily   cetirizine (ZYRTEC) 10 MG tablet Take 1 tablet (10 mg total) by mouth daily.   diclofenac (VOLTAREN) 75 MG EC tablet Take 1 tablet (75 mg total) by mouth 2 (two) times daily.   DULoxetine (CYMBALTA) 30 MG capsule Take 2 capsules (60 mg total) by mouth 2 (two) times daily.   DULoxetine (CYMBALTA) 60 MG capsule TAKE ONE CAPSULE BY MOUTH TWICE DAILY   Finerenone (KERENDIA) 10 MG TABS TAKE 1 TABLET BY MOUTH EVERY DAY   gabapentin (NEURONTIN) 400 MG capsule TAKE 1  CAPSULE BY MOUTH TWICE DAILY AND 2 CAPSULES BY MOUTH AT BEDTIME   Insulin Glargine (BASAGLAR KWIKPEN) 100 UNIT/ML INJECT 17 UNITS UNDER THE SKIN ONCE DAILY AT BEDTIME.   insulin lispro (HUMALOG) 100 UNIT/ML injection Inject 0.02 mLs (2 Units total) into the skin 3 (three) times daily before meals. Sliding scale 121-150 = 2 units + 1 unit = 3 units,  151-200 = 2 units + 2 units = 4 units 201- 250 = 2 units + 3 units = 5 units 251 - 300 = 2 units + 4 units = 6 units 301- 350 = 2 units + 5 units = 7 units 351 - 400 = 2 units + 6 units = 8 units > 400 call clinic.   Insulin Pen Needle 32G X 6 MM MISC Use as directed.   Magnesium Oxide 400 MG CAPS Take 1 capsule (400 mg total) by mouth daily in the afternoon.   Melatonin 10 MG TABS Take 1 tablet by mouth at bedtime.   metFORMIN (GLUCOPHAGE) 500 MG tablet TAKE 1 TABLET BY MOUTH TWICE DAILY   methotrexate (RHEUMATREX) 2.5 MG  tablet Take 3 tablets (7.5 mg total) by mouth once a week. Caution:Chemotherapy. Protect from light.   metoCLOPramide (REGLAN) 10 MG tablet Take 1 tablet (10 mg total) by mouth every 6 (six) hours as needed for nausea or vomiting.   metoprolol tartrate (LOPRESSOR) 25 MG tablet TAKE 2 TABLETS BY MOUTH TWICE DAILY   montelukast (SINGULAIR) 10 MG tablet Take 1 tablet (10 mg total) by mouth daily.   Omega-3 Fatty Acids (FISH OIL) 1000 MG CAPS Take 1,000 mg by mouth 2 (two) times daily.   omeprazole (PRILOSEC) 20 MG capsule Take 1 capsule (20 mg total) by mouth 2 (two) times daily.   rosuvastatin (CRESTOR) 5 MG tablet TAKE ONE TABLET BY MOUTH ONCE DAILY   Semaglutide, 1 MG/DOSE, (OZEMPIC, 1 MG/DOSE,) 4 MG/3ML SOPN Inject 1 mg into the skin once a week. Discontinue Trulicity.   SYMBICORT 160-4.5 MCG/ACT inhaler Inhale 2 puffs into the lungs 2 (two) times daily. Rinse your mouth well after use.   Immunization History  Administered Date(s) Administered   Influenza,inj,Quad PF,6+ Mos 05/15/2019, 06/01/2022   Influenza-Unspecified  06/04/2015, 06/10/2016, 06/08/2017, 05/25/2018, 06/01/2020, 05/20/2021   PFIZER(Purple Top)SARS-COV-2 Vaccination 12/02/2019, 12/23/2019   Pneumococcal Polysaccharide-23 05/16/2019   Tdap 06/04/2015       Objective:   Physical Exam BP 110/62 (BP Location: Left Arm, Cuff Size: Normal)   Pulse 77   Temp 98.3 F (36.8 C)   Ht 5\' 2"  (1.575 m)   Wt 174 lb 6.4 oz (79.1 kg)   SpO2 100%   BMI 31.90 kg/m   SpO2: 100 % O2 Device: None (Room air)  GENERAL: Obese, well-developed woman, no acute distress.  Fully ambulatory, no conversational dyspnea. HEAD: Normocephalic, atraumatic. EYES: Pupils equal, round, reactive to light.  No scleral icterus. MOUTH: Poor dentition, oral mucosa moist.  No thrush. NECK: Supple. No thyromegaly. Trachea midline. No JVD.  No adenopathy. PULMONARY: Good air entry bilaterally.  No adventitious sounds. CARDIOVASCULAR: S1 and S2. Regular rate and rhythm.  No rubs, murmurs or gallops heard. ABDOMEN: Obese otherwise benign. MUSCULOSKELETAL: No joint deformity, no clubbing, no edema.  NEUROLOGIC: No overt focal deficit, no gait disturbance, speech is fluent. SKIN: Intact,warm,dry.  PSYCH: Mood and behavior normal.     Assessment & Plan:     ICD-10-CM   1. Sarcoidosis  D86.9 Angiotensin converting enzyme    Ambulatory referral to Nephrology   Continue methotrexate 7.5 mg week 3 CBC, CMP and ACE level today    2. Lymphadenopathy, generalized  R59.1 Angiotensin converting enzyme    Ambulatory referral to Nephrology   Due to sarcoidosis Continue methotrexate Reimage chest,abdomen and pelvis CT August 2024    3. Reactive airways dysfunction syndrome (HCC)  J68.3    Doing well with Symbicort 160/4.5, 2 inhalations twice a day Continue same    4. Medication monitoring encounter  Z51.81    CMP, CBC today Monitoring methotrexate    5. Stage 3 chronic kidney disease, unspecified whether stage 3a or 3b CKD (HCC)  N18.30    She has underlying diabetes  mellitus Sarcoidosis can also affect kidney Referral to nephrology to establish care     Orders Placed This Encounter  Procedures   Angiotensin converting enzyme    Draw along with other labs ordered    Standing Status:   Future    Number of Occurrences:   1    Standing Expiration Date:   01/05/2024   Ambulatory referral to Nephrology    Referral Priority:   Routine  Referral Type:   Consultation    Referral Reason:   Specialty Services Required    Referred to Provider:   Lamont Dowdy, MD    Requested Specialty:   Nephrology    Number of Visits Requested:   1   Lab work is pending today.Will see the patient in follow-up in 3 months time she is to contact us prior to that time should any new difficulties arise.  Gailen Shelter, MD Advanced Bronchoscopy P[facility]    *This note was dictated using voice recognition software/Dragon.  Despite best efforts to proofread, errors can occur which can change the meaning. Any transcriptional errors that result from this process are unintentional and may not be fully corrected at the time of dictation.

## 2023-01-05 NOTE — Patient Instructions (Signed)
You have blood work ordered, you may go by the lab and get that drawn.  Your lungs sounded clear today this is good news.  Continue using your Symbicort.  Continue taking your methotrexate once a week at the same dose.  You will be due for another body scan around August.  We will schedule it at that time.  Will see you in follow-up in 3 months time call sooner should any new problems arise.

## 2023-01-05 NOTE — Progress Notes (Deleted)
   Subjective:    Patient ID: Renee Bush, female    DOB: [birth year], 62 y.o.   MRN: [id number]  HPI    Review of Systems     Objective:   Physical Exam        Assessment & Plan:

## 2023-01-06 LAB — ANGIOTENSIN CONVERTING ENZYME: Angiotensin-Converting Enzyme: 80 U/L (ref 14–82)

## 2023-01-09 ENCOUNTER — Ambulatory Visit: Payer: Medicaid Other | Admitting: Family

## 2023-01-09 ENCOUNTER — Encounter: Payer: Self-pay | Admitting: Family

## 2023-01-09 VITALS — BP 112/70 | HR 75 | Ht 62.0 in | Wt 173.0 lb

## 2023-01-09 DIAGNOSIS — E1140 Type 2 diabetes mellitus with diabetic neuropathy, unspecified: Secondary | ICD-10-CM | POA: Diagnosis not present

## 2023-01-09 DIAGNOSIS — N1831 Chronic kidney disease, stage 3a: Secondary | ICD-10-CM | POA: Diagnosis not present

## 2023-01-09 DIAGNOSIS — E538 Deficiency of other specified B group vitamins: Secondary | ICD-10-CM

## 2023-01-09 DIAGNOSIS — I10 Essential (primary) hypertension: Secondary | ICD-10-CM

## 2023-01-09 DIAGNOSIS — R5383 Other fatigue: Secondary | ICD-10-CM | POA: Diagnosis not present

## 2023-01-09 DIAGNOSIS — E559 Vitamin D deficiency, unspecified: Secondary | ICD-10-CM

## 2023-01-09 DIAGNOSIS — E785 Hyperlipidemia, unspecified: Secondary | ICD-10-CM

## 2023-01-09 DIAGNOSIS — R300 Dysuria: Secondary | ICD-10-CM

## 2023-01-09 DIAGNOSIS — E1169 Type 2 diabetes mellitus with other specified complication: Secondary | ICD-10-CM

## 2023-01-09 DIAGNOSIS — R109 Unspecified abdominal pain: Secondary | ICD-10-CM

## 2023-01-09 DIAGNOSIS — F331 Major depressive disorder, recurrent, moderate: Secondary | ICD-10-CM

## 2023-01-09 DIAGNOSIS — G4762 Sleep related leg cramps: Secondary | ICD-10-CM

## 2023-01-09 DIAGNOSIS — I471 Supraventricular tachycardia, unspecified: Secondary | ICD-10-CM

## 2023-01-09 DIAGNOSIS — Z794 Long term (current) use of insulin: Secondary | ICD-10-CM | POA: Diagnosis not present

## 2023-01-09 LAB — POCT URINALYSIS DIPSTICK
Bilirubin, UA: NEGATIVE
Blood, UA: NEGATIVE
Glucose, UA: POSITIVE — AB
Ketones, UA: NEGATIVE
Nitrite, UA: POSITIVE
Protein, UA: POSITIVE — AB
Spec Grav, UA: 1.02 (ref 1.010–1.025)
Urobilinogen, UA: 0.2 E.U./dL
pH, UA: 6 (ref 5.0–8.0)

## 2023-01-09 LAB — POCT CBG (FASTING - GLUCOSE)-MANUAL ENTRY: Glucose Fasting, POC: 258 mg/dL — AB (ref 70–99)

## 2023-01-09 LAB — POC CREATINE & ALBUMIN,URINE
Creatinine, POC: 100 mg/dL
Microalbumin Ur, POC: 80 mg/L

## 2023-01-09 MED ORDER — METOPROLOL TARTRATE 25 MG PO TABS: 50.0000 mg | ORAL_TABLET | Freq: Two times a day (BID) | ORAL | 1 refills | Status: DC

## 2023-01-09 MED ORDER — ACCRUFER 30 MG PO CAPS: 1.0000 | ORAL_CAPSULE | Freq: Two times a day (BID) | ORAL | 4 refills | Status: DC

## 2023-01-09 MED ORDER — FEXOFENADINE HCL 180 MG PO TABS: 180.0000 mg | ORAL_TABLET | Freq: Every day | ORAL | 1 refills | Status: DC

## 2023-01-09 MED ORDER — GABAPENTIN 400 MG PO CAPS: ORAL_CAPSULE | ORAL | 1 refills | Status: DC

## 2023-01-09 MED ORDER — DULOXETINE HCL 60 MG PO CPEP: 60.0000 mg | ORAL_CAPSULE | Freq: Two times a day (BID) | ORAL | 3 refills | Status: DC

## 2023-01-09 MED ORDER — AZELASTINE HCL 0.1 % NA SOLN: 1.0000 | Freq: Two times a day (BID) | NASAL | 12 refills | Status: DC

## 2023-01-09 MED ORDER — CIPROFLOXACIN HCL 250 MG PO TABS: 250.0000 mg | ORAL_TABLET | Freq: Two times a day (BID) | ORAL | 0 refills | Status: DC

## 2023-01-09 NOTE — Assessment & Plan Note (Signed)
Blood pressure well controlled with current medications.  Continue current therapy.  Will reassess at follow up.  

## 2023-01-09 NOTE — Assessment & Plan Note (Signed)
Checking labs today.  Will continue supplements as needed.  

## 2023-01-09 NOTE — Assessment & Plan Note (Signed)
Patient has appointment with nephrology in July.  Microalbumin rechecked today - stable.   Will Check labs today and adjust medications as needed.  Reassess at follow up.

## 2023-01-09 NOTE — Assessment & Plan Note (Addendum)
Patient is dealing with increased levels of stress due to her husband's recent illness.  As she has been dealing with this since February, her anxiety and depression are still affecting her,  but she says she hasn't really had time to think about them because she has been too busy.   She is doing well with the cymbalta. Will continue this.  Reassess at follow up.

## 2023-01-09 NOTE — Progress Notes (Signed)
Established Patient Office Visit  Subjective:  Patient ID: Renee Bush, female    DOB: 13-Feb-1961  Age: 62 y.o. MRN: [id number]  Chief Complaint  Patient presents with   Follow-up    3 month follow up    Patient is here today for her 3 months follow up.  She has been feeling well since last appointment.   She does have additional concerns to discuss today.    Urinary Tract Infection  This is a new problem. The current episode started in the past 7 days. The problem has been gradually worsening. The quality of the pain is described as aching. The pain is at a severity of 5/10. The pain is moderate. There has been no fever. She is Not sexually active. There is No history of pyelonephritis. Associated symptoms include flank pain and frequency. Associated symptoms comments: Foul odor. She has tried increased fluids for the symptoms. The treatment provided no relief.  Diabetes She presents for her follow-up diabetic visit. She has type 2 diabetes mellitus. No MedicAlert identification noted. Her disease course has been stable. There are no hypoglycemic associated symptoms. Associated symptoms include foot paresthesias. There are no hypoglycemic complications. Symptoms are stable. 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 Risk factors for coronary artery disease include diabetes mellitus, dyslipidemia, hypertension, stress and post-menopausal. Current diabetic treatment includes oral agent (dual therapy), diet and insulin injections. She is compliant with treatment all of the time. She sees a podiatrist.Eye exam is not current.  Leg Pain  The incident occurred more than 1 week ago. The incident occurred at home. There was no injury mechanism. The pain is present in the right leg and left leg. The quality of the pain is described as cramping. The pain has been Intermittent since onset. She reports no foreign bodies present. Nothing aggravates the symptoms. She  has tried elevation, heat, NSAIDs, non-weight bearing and rest for the symptoms. The treatment provided no relief.    Labs are due today. She needs refills.   I have reviewed her active problem list, medication list, allergies, health maintenance, notes from last encounter, lab results for her appointment today.   No other concerns at this time.   Past Medical History:  Diagnosis Date   AKI (acute kidney injury) (HCC) 07/07/2022   Antibiotic-induced yeast infection 01/09/2020   Balance problem 12/18/2019   Blood in stool 12/28/2021   Chest pain 10/14/2021   Chronic kidney disease, stage III (moderate) (HCC) 10/06/2017   Constipation 07/13/2021   Diabetes mellitus without complication (HCC)    DKA, type 2 (HCC) 04/16/2022   ETOH abuse 04/18/2015   Family history of colonic polyps    GERD (gastroesophageal reflux disease)    History of allergy 06/11/2019   Hospital discharge follow-up 01/06/2022   Hypercholesteremia    Hypertension 09/10/2015   Hypokalemia 04/17/2022   Palpitations 10/28/2021   Persistent dry cough 11/16/2021   Sarcoidosis 10/12/2021   self-reported   Shortness of breath 10/28/2021   Urinary frequency 07/02/2020   Urinary tract infection 11/16/2021    Past Surgical History:  Procedure Laterality Date   APPENDECTOMY     CESAREAN SECTION  1991   COLONOSCOPY WITH PROPOFOL N/A 06/03/2019   Procedure: COLONOSCOPY WITH PROPOFOL;  Surgeon: Wyline Mood, MD;  Location: [facility] ENDOSCOPY;  Service: Gastroenterology;  Laterality: N/A;    Social History   Socioeconomic History   Marital status: Married    Spouse name: Channing Mutters   Number of children: 4   Years  of education: Not on file   Highest education level: 11th grade  Occupational History   Occupation: na  Tobacco Use   Smoking status: Former    Packs/day: 1.00    Years: 5.00    Additional pack years: 0.00    Total pack years: 5.00    Types: Cigarettes    Quit date: 04/17/2005    Years since quitting: 17.7    Smokeless tobacco: Never  Vaping Use   Vaping Use: Never used  Substance and Sexual Activity   Alcohol use: Not Currently    Alcohol/week: 14.0 standard drinks of alcohol    Types: 14 Cans of beer per week    Comment: quit ~2020   Drug use: No   Sexual activity: Not Currently  Other Topics Concern   Not on file  Social History Narrative   Lives in mobile home paid for   Social Determinants of Health   Financial Resource Strain: Low Risk  (05/19/2020)   Overall Financial Resource Strain (CARDIA)    Difficulty of Paying Living Expenses: Not hard at all  Food Insecurity: No Food Insecurity (05/11/2021)   Hunger Vital Sign    Worried About Running Out of Food in the Last Year: Never true    Ran Out of Food in the Last Year: Never true  Transportation Needs: No Transportation Needs (05/11/2021)   PRAPARE - Administrator, Civil Service (Medical): No    Lack of Transportation (Non-Medical): No  Physical Activity: Insufficiently Active (05/19/2020)   Exercise Vital Sign    Days of Exercise per Week: 7 days    Minutes of Exercise per Session: 10 min  Stress: Stress Concern Present (05/28/2020)   [employer] of Occupational Health - Occupational Stress Questionnaire    Feeling of Stress : To some extent  Social Connections: Moderately Isolated (05/28/2020)   Social Connection and Isolation Panel [NHANES]    Frequency of Communication with Friends and Family: More than three times a week    Frequency of Social Gatherings with Friends and Family: More than three times a week    Attends Religious Services: Never    Database administrator or Organizations: No    Attends Banker Meetings: Never    Marital Status: Married  Catering manager Violence: Not At Risk (05/19/2020)   Humiliation, Afraid, Rape, and Kick questionnaire    Fear of Current or Ex-Partner: No    Emotionally Abused: No    Physically Abused: No    Sexually Abused: No    Family History   Problem Relation Age of Onset   COPD Mother    Alzheimer's disease Mother    Hypertension Father    Hyperlipidemia Father    Heart attack Father    Diabetes type II Father    Lupus Father    Diabetes type II Sister    Diabetes type II Sister    Diabetes type II Sister    Diabetes type II Sister    Gestational diabetes Daughter     Allergies  Allergen Reactions   Etodolac Rash   Penicillin G Rash   Penicillins Rash    Has patient had a PCN reaction causing immediate rash, facial/tongue/throat swelling, SOB or lightheadedness with hypotension: Yes Has patient had a PCN reaction causing severe rash involving mucus membranes or skin necrosis: No Has patient had a PCN reaction that required hospitalization: No Has patient had a PCN reaction occurring within the last 10 years: No If all  of the above answers are "NO", then may proceed with Cephalosporin use.    Review of Systems  HENT:  Positive for congestion and sinus pain.   Genitourinary:  Positive for flank pain and frequency.  Musculoskeletal:  Positive for myalgias.  All other systems reviewed and are negative.      Objective:   BP 112/70   Pulse 75   Ht 5\' 2"  (1.575 m)   Wt 173 lb (78.5 kg)   SpO2 99%   BMI 31.64 kg/m   Vitals:   01/09/23 0859  BP: 112/70  Pulse: 75  Height: 5\' 2"  (1.575 m)  Weight: 173 lb (78.5 kg)  SpO2: 99%  BMI (Calculated): 31.63    Physical Exam Vitals and nursing note reviewed.  Constitutional:      Appearance: Normal appearance. She is normal weight.  HENT:     Head: Normocephalic.  Eyes:     Extraocular Movements: Extraocular movements intact.     Conjunctiva/sclera: Conjunctivae normal.     Pupils: Pupils are equal, round, and reactive to light.  Cardiovascular:     Rate and Rhythm: Normal rate and regular rhythm.     Pulses: Normal pulses.     Heart sounds: Normal heart sounds.  Pulmonary:     Effort: Pulmonary effort is normal.     Breath sounds: Normal breath  sounds.  Musculoskeletal:     Cervical back: Normal range of motion.  Neurological:     General: No focal deficit present.     Mental Status: She is alert and oriented to person, place, and time. Mental status is at baseline.  Psychiatric:        Mood and Affect: Mood normal.        Behavior: Behavior normal.        Thought Content: Thought content normal.        Judgment: Judgment normal.      Results for orders placed or performed in visit on 01/09/23  POCT CBG (Fasting - Glucose)  Result Value Ref Range   Glucose Fasting, POC 258 (A) 70 - 99 mg/dL  POCT Urinalysis Dipstick (81002)  Result Value Ref Range   Color, UA     Clarity, UA     Glucose, UA Positive (A) Negative   Bilirubin, UA Negative    Ketones, UA Negative    Spec Grav, UA 1.020 1.010 - 1.025   Blood, UA Negative    pH, UA 6.0 5.0 - 8.0   Protein, UA Positive (A) Negative   Urobilinogen, UA 0.2 0.2 or 1.0 E.U./dL   Nitrite, UA Positive    Leukocytes, UA Small (1+) (A) Negative   Appearance     Odor    POC CREATINE & ALBUMIN,URINE  Result Value Ref Range   Microalbumin Ur, POC 80 mg/L   Creatinine, POC 100 mg/dL   Albumin/Creatinine Ratio, Urine, POC 30-300     Recent Results (from the past 2160 hour(s))  Angiotensin converting enzyme     Status: None   Collection Time: 11/25/22 10:53 AM  Result Value Ref Range   Angiotensin-Converting Enzyme 79 14 - 82 U/L    Comment: (NOTE) Performed At: [facility] [address] [place], [place] [id number] Jolene Schimke MD WU:[id number]   CBC     Status: Abnormal   Collection Time: 11/25/22 10:53 AM  Result Value Ref Range   WBC 5.9 4.0 - 10.5 K/uL   RBC 3.49 (L) 3.87 - 5.11 MIL/uL   Hemoglobin 10.8 (L)  12.0 - 15.0 g/dL   HCT 16.1 (L) 09.6 - 04.5 %   MCV 94.3 80.0 - 100.0 fL   MCH 30.9 26.0 - 34.0 pg   MCHC 32.8 30.0 - 36.0 g/dL   RDW 40.9 81.1 - 91.4 %   Platelets 166 150 - 400 K/uL   nRBC 0.0 0.0 - 0.2 %    Comment: Performed at [facility], [address] [place], [place] [address]  Comp Met (CMET)     Status: Abnormal   Collection Time: 11/25/22 10:53 AM  Result Value Ref Range   Sodium 136 135 - 145 mmol/L   Potassium 4.3 3.5 - 5.1 mmol/L   Chloride 103 98 - 111 mmol/L   CO2 28 22 - 32 mmol/L   Glucose, Bld 152 (H) 70 - 99 mg/dL    Comment: Glucose reference range applies only to samples taken after fasting for at least 8 hours.   BUN 26 (H) 8 - 23 mg/dL   Creatinine, Ser 6.21 (H) 0.44 - 1.00 mg/dL   Calcium 9.2 8.9 - 30.8 mg/dL   Total Protein 6.0 (L) 6.5 - 8.1 g/dL   Albumin 3.3 (L) 3.5 - 5.0 g/dL   AST 37 15 - 41 U/L   ALT 71 (H) 0 - 44 U/L   Alkaline Phosphatase 55 38 - 126 U/L   Total Bilirubin 0.6 0.3 - 1.2 mg/dL   GFR, Estimated 57 (L) >60 mL/min    Comment: (NOTE) Calculated using the CKD-EPI Creatinine Equation (2021)    Anion gap 5 5 - 15    Comment: Performed at [facility], [address] [place], [place] [address]  Comprehensive metabolic panel     Status: Abnormal   Collection Time: 01/05/23 10:18 AM  Result Value Ref Range   Sodium 137 135 - 145 mmol/L   Potassium 4.7 3.5 - 5.1 mmol/L   Chloride 104 98 - 111 mmol/L   CO2 27 22 - 32 mmol/L   Glucose, Bld 116 (H) 70 - 99 mg/dL    Comment: Glucose reference range applies only to samples taken after fasting for at least 8 hours.   BUN 22 8 - 23 mg/dL   Creatinine, Ser 6.96 0.44 - 1.00 mg/dL   Calcium 8.7 (L) 8.9 - 10.3 mg/dL   Total Protein 5.8 (L) 6.5 - 8.1 g/dL   Albumin 3.3 (L) 3.5 - 5.0 g/dL   AST 38 15 - 41 U/L   ALT 52 (H) 0 - 44 U/L   Alkaline Phosphatase 62 38 - 126 U/L   Total Bilirubin 0.7 0.3 - 1.2 mg/dL   GFR, Estimated >29 >52 mL/min    Comment: (NOTE) Calculated using the CKD-EPI Creatinine Equation (2021)    Anion gap 6 5 - 15    Comment: Performed at [facility], [address] [place], [place] [address]  Angiotensin converting enzyme     Status: None   Collection Time: 01/05/23 10:18  AM  Result Value Ref Range   Angiotensin-Converting Enzyme 80 14 - 82 U/L    Comment: (NOTE) Performed At: [facility] [address] [place], [place] [id number] Jolene Schimke MD [id number]   CBC with Differential/Platelet     Status: Abnormal   Collection Time: 01/05/23 11:25 AM  Result Value Ref Range   WBC 6.9 4.0 - 10.5 K/uL   RBC 3.28 (L) 3.87 - 5.11 MIL/uL   Hemoglobin 9.8 (L) 12.0 - 15.0 g/dL   HCT 95.6 (L)  36.0 - 46.0 %   MCV 92.4 80.0 - 100.0 fL   MCH 29.9 26.0 - 34.0 pg   MCHC 32.3 30.0 - 36.0 g/dL   RDW 16.1 09.6 - 04.5 %   Platelets 175 150 - 400 K/uL   nRBC 0.0 0.0 - 0.2 %   Neutrophils Relative % 72 %   Neutro Abs 5.0 1.7 - 7.7 K/uL   Lymphocytes Relative 20 %   Lymphs Abs 1.4 0.7 - 4.0 K/uL   Monocytes Relative 5 %   Monocytes Absolute 0.3 0.1 - 1.0 K/uL   Eosinophils Relative 3 %   Eosinophils Absolute 0.2 0.0 - 0.5 K/uL   Basophils Relative 0 %   Basophils Absolute 0.0 0.0 - 0.1 K/uL   Immature Granulocytes 0 %   Abs Immature Granulocytes 0.01 0.00 - 0.07 K/uL    Comment: Performed at [facility], [address] [place], [place] [address]  POCT CBG (Fasting - Glucose)     Status: Abnormal   Collection Time: 01/09/23  9:51 AM  Result Value Ref Range   Glucose Fasting, POC 258 (A) 70 - 99 mg/dL    Comment: fasting  POCT Urinalysis Dipstick (19147)     Status: Abnormal   Collection Time: 01/09/23 10:26 AM  Result Value Ref Range   Color, UA     Clarity, UA     Glucose, UA Positive (A) Negative   Bilirubin, UA Negative    Ketones, UA Negative    Spec Grav, UA 1.020 1.010 - 1.025   Blood, UA Negative    pH, UA 6.0 5.0 - 8.0   Protein, UA Positive (A) Negative    Comment: trace   Urobilinogen, UA 0.2 0.2 or 1.0 E.U./dL   Nitrite, UA Positive    Leukocytes, UA Small (1+) (A) Negative   Appearance     Odor    POC CREATINE & ALBUMIN,URINE     Status: Abnormal   Collection Time: 01/09/23 10:28 AM  Result Value Ref Range    Microalbumin Ur, POC 80 mg/L   Creatinine, POC 100 mg/dL   Albumin/Creatinine Ratio, Urine, POC 30-300        Assessment & Plan:   Problem List Items Addressed This Visit       Active Problems   Type 2 diabetes mellitus with diabetic neuropathy, with long-term current use of insulin (HCC) - Primary    Checking labs today. Will call pt. With results  Sending referral for ophthalmology.   Continue current diabetes POC, as patient has been well controlled on current regimen.  Will adjust meds if needed based on labs.       Relevant Orders   POCT CBG (Fasting - Glucose) (Completed)   Ambulatory referral to Ophthalmology   CBC With Differential   CMP14+EGFR   Hemoglobin A1c   POC CREATINE & ALBUMIN,URINE (Completed)   Essential hypertension    Blood pressure well controlled with current medications.  Continue current therapy.  Will reassess at follow up.       Relevant Medications   metoprolol tartrate (LOPRESSOR) 25 MG tablet   Other Relevant Orders   CBC With Differential   CMP14+EGFR   Hyperlipidemia associated with type 2 diabetes mellitus (HCC)    Checking labs today.  Continue current therapy for lipid control. Will modify as needed based on labwork results.       Relevant Medications   metoprolol tartrate (LOPRESSOR) 25 MG tablet   Other Relevant Orders   Lipid  panel   CBC With Differential   CMP14+EGFR   B12 deficiency    Checking labs today.  Will continue supplements as needed.       Relevant Orders   CBC With Differential   CMP14+EGFR   Vitamin B12   Chronic kidney disease, stage III (moderate) (HCC)    Patient has appointment with nephrology in July.  Microalbumin rechecked today - stable.   Will Check labs today and adjust medications as needed.  Reassess at follow up.        Relevant Orders   CBC With Differential   CMP14+EGFR   POCT Urinalysis Dipstick (81002) (Completed)   Paroxysmal SVT (supraventricular tachycardia)    SVT is well  controlled with metoprolol.  She is not having any symptoms at this time.   Will reassess at follow up.       Relevant Medications   metoprolol tartrate (LOPRESSOR) 25 MG tablet   Moderate episode of recurrent major depressive disorder (HCC)    Patient is dealing with increased levels of stress due to her husband's recent illness.  As she has been dealing with this since February, her anxiety and depression are still affecting her,  but she says she hasn't really had time to think about them because she has been too busy.   She is doing well with the cymbalta. Will continue this.  Reassess at follow up.        Relevant Medications   DULoxetine (CYMBALTA) 60 MG capsule   Other Visit Diagnoses     Vitamin D deficiency       Relevant Orders   VITAMIN D 25 Hydroxy (Vit-D Deficiency, Fractures)   CBC With Differential   CMP14+EGFR   Other fatigue       Relevant Orders   CBC With Differential   CMP14+EGFR   TSH   POCT Urinalysis Dipstick (81002) (Completed)   Nocturnal leg cramps       Relevant Orders   CBC With Differential   CMP14+EGFR   Magnesium   Dysuria       Relevant Medications   ciprofloxacin (CIPRO) 250 MG tablet   Other Relevant Orders   CBC With Differential   CMP14+EGFR   Urinalysis, Routine w reflex microscopic   Urine Culture   Flank pain       Relevant Medications   ciprofloxacin (CIPRO) 250 MG tablet   Other Relevant Orders   CBC With Differential   CMP14+EGFR   Urinalysis, Routine w reflex microscopic   Urine Culture       Return in about 3 months (around 04/11/2023) for F/U.   Total time spent: 30 minutes  Miki Kins, FNP  01/09/2023

## 2023-01-09 NOTE — Assessment & Plan Note (Signed)
SVT is well controlled with metoprolol.  She is not having any symptoms at this time.   Will reassess at follow up.

## 2023-01-09 NOTE — Assessment & Plan Note (Addendum)
Checking labs today.  Continue current therapy for lipid control. Will modify as needed based on labwork results.  

## 2023-01-09 NOTE — Assessment & Plan Note (Signed)
Checking labs today. Will call pt. With results  Sending referral for ophthalmology.   Continue current diabetes POC, as patient has been well controlled on current regimen.  Will adjust meds if needed based on labs.

## 2023-01-10 LAB — CMP14+EGFR
ALT: 31 IU/L (ref 0–32)
AST: 22 IU/L (ref 0–40)
Albumin/Globulin Ratio: 1.7 (ref 1.2–2.2)
Albumin: 3.4 g/dL — ABNORMAL LOW (ref 3.9–4.9)
Alkaline Phosphatase: 79 IU/L (ref 44–121)
BUN/Creatinine Ratio: 17 (ref 12–28)
BUN: 17 mg/dL (ref 8–27)
Bilirubin Total: 0.3 mg/dL (ref 0.0–1.2)
CO2: 23 mmol/L (ref 20–29)
Calcium: 8.4 mg/dL — ABNORMAL LOW (ref 8.7–10.3)
Chloride: 104 mmol/L (ref 96–106)
Creatinine, Ser: 1.03 mg/dL — ABNORMAL HIGH (ref 0.57–1.00)
Globulin, Total: 2 g/dL (ref 1.5–4.5)
Glucose: 187 mg/dL — ABNORMAL HIGH (ref 70–99)
Potassium: 5.7 mmol/L — ABNORMAL HIGH (ref 3.5–5.2)
Sodium: 138 mmol/L (ref 134–144)
Total Protein: 5.4 g/dL — ABNORMAL LOW (ref 6.0–8.5)
eGFR: 61 mL/min/{1.73_m2} (ref >59)

## 2023-01-10 LAB — LIPID PANEL
Chol/HDL Ratio: 4.4 ratio (ref 0.0–4.4)
Cholesterol, Total: 145 mg/dL (ref 100–199)
HDL: 33 mg/dL — ABNORMAL LOW (ref >39)
LDL Chol Calc (NIH): 90 mg/dL (ref 0–99)
Triglycerides: 119 mg/dL (ref 0–149)
VLDL Cholesterol Cal: 22 mg/dL (ref 5–40)

## 2023-01-10 LAB — VITAMIN B12: Vitamin B-12: 376 pg/mL (ref 232–1245)

## 2023-01-10 LAB — VITAMIN D 25 HYDROXY (VIT D DEFICIENCY, FRACTURES): Vit D, 25-Hydroxy: 44.7 ng/mL (ref 30.0–100.0)

## 2023-01-10 LAB — URINALYSIS, ROUTINE W REFLEX MICROSCOPIC
Bilirubin, UA: NEGATIVE
Nitrite, UA: NEGATIVE
RBC, UA: NEGATIVE
Specific Gravity, UA: 1.02 (ref 1.005–1.030)
Urobilinogen, Ur: 0.2 mg/dL (ref 0.2–1.0)
pH, UA: 6 (ref 5.0–7.5)

## 2023-01-10 LAB — MICROSCOPIC EXAMINATION
Casts: NONE SEEN /lpf
RBC, Urine: NONE SEEN /hpf (ref 0–2)
WBC, UA: >30 /hpf — AB (ref 0–5)

## 2023-01-10 LAB — HEMOGLOBIN A1C
Est. average glucose Bld gHb Est-mCnc: 200 mg/dL
Hgb A1c MFr Bld: 8.6 % — ABNORMAL HIGH (ref 4.8–5.6)

## 2023-01-10 LAB — CBC WITH DIFFERENTIAL
Basophils Absolute: 0 10*3/uL (ref 0.0–0.2)
Basos: 0 %
EOS (ABSOLUTE): 0.2 10*3/uL (ref 0.0–0.4)
Eos: 3 %
Hematocrit: 31.3 % — ABNORMAL LOW (ref 34.0–46.6)
Hemoglobin: 10.2 g/dL — ABNORMAL LOW (ref 11.1–15.9)
Immature Grans (Abs): 0 10*3/uL (ref 0.0–0.1)
Immature Granulocytes: 0 %
Lymphocytes Absolute: 1.1 10*3/uL (ref 0.7–3.1)
Lymphs: 20 %
MCH: 30.1 pg (ref 26.6–33.0)
MCHC: 32.6 g/dL (ref 31.5–35.7)
MCV: 92 fL (ref 79–97)
Monocytes Absolute: 0.5 10*3/uL (ref 0.1–0.9)
Monocytes: 8 %
Neutrophils Absolute: 4 10*3/uL (ref 1.4–7.0)
Neutrophils: 69 %
RBC: 3.39 x10E6/uL — ABNORMAL LOW (ref 3.77–5.28)
RDW: 15.4 % (ref 11.7–15.4)
WBC: 5.8 10*3/uL (ref 3.4–10.8)

## 2023-01-10 LAB — MAGNESIUM: Magnesium: 2.2 mg/dL (ref 1.6–2.3)

## 2023-01-10 LAB — TSH: TSH: 1.41 u[IU]/mL (ref 0.450–4.500)

## 2023-01-12 LAB — URINE CULTURE

## 2023-01-13 ENCOUNTER — Encounter: Payer: Self-pay | Admitting: Pulmonary Disease

## 2023-01-16 ENCOUNTER — Encounter: Payer: Self-pay | Admitting: Family

## 2023-01-16 ENCOUNTER — Other Ambulatory Visit: Payer: Self-pay

## 2023-01-22 ENCOUNTER — Other Ambulatory Visit: Payer: Self-pay | Admitting: Family

## 2023-03-01 ENCOUNTER — Encounter: Payer: Self-pay | Admitting: Internal Medicine

## 2023-03-01 ENCOUNTER — Other Ambulatory Visit
Admission: RE | Admit: 2023-03-01 | Discharge: 2023-03-01 | Disposition: A | Discharge status: Home / Self Care | Payer: Medicaid Other | Attending: Internal Medicine | Admitting: Internal Medicine

## 2023-03-01 ENCOUNTER — Ambulatory Visit: Payer: Medicaid Other | Attending: Internal Medicine | Admitting: Internal Medicine

## 2023-03-01 VITALS — BP 110/60 | HR 75 | Ht 62.0 in | Wt 176.4 lb

## 2023-03-01 DIAGNOSIS — I2510 Atherosclerotic heart disease of native coronary artery without angina pectoris: Secondary | ICD-10-CM | POA: Diagnosis not present

## 2023-03-01 DIAGNOSIS — E875 Hyperkalemia: Secondary | ICD-10-CM | POA: Insufficient documentation

## 2023-03-01 DIAGNOSIS — Z79899 Other long term (current) drug therapy: Secondary | ICD-10-CM

## 2023-03-01 DIAGNOSIS — E1169 Type 2 diabetes mellitus with other specified complication: Secondary | ICD-10-CM

## 2023-03-01 DIAGNOSIS — Z7984 Long term (current) use of oral hypoglycemic drugs: Secondary | ICD-10-CM

## 2023-03-01 DIAGNOSIS — Z794 Long term (current) use of insulin: Secondary | ICD-10-CM

## 2023-03-01 DIAGNOSIS — D869 Sarcoidosis, unspecified: Secondary | ICD-10-CM | POA: Diagnosis not present

## 2023-03-01 DIAGNOSIS — E785 Hyperlipidemia, unspecified: Secondary | ICD-10-CM | POA: Diagnosis present

## 2023-03-01 DIAGNOSIS — I471 Supraventricular tachycardia, unspecified: Secondary | ICD-10-CM

## 2023-03-01 LAB — RENAL FUNCTION PANEL
Albumin: 2.9 g/dL — ABNORMAL LOW (ref 3.5–5.0)
Anion gap: 7 (ref 5–15)
BUN: 20 mg/dL (ref 8–23)
CO2: 26 mmol/L (ref 22–32)
Calcium: 8.8 mg/dL — ABNORMAL LOW (ref 8.9–10.3)
Chloride: 106 mmol/L (ref 98–111)
Creatinine, Ser: 1.09 mg/dL — ABNORMAL HIGH (ref 0.44–1.00)
GFR, Estimated: 57 mL/min — ABNORMAL LOW (ref >60)
Glucose, Bld: 90 mg/dL (ref 70–99)
Phosphorus: 3.7 mg/dL (ref 2.5–4.6)
Potassium: 4.5 mmol/L (ref 3.5–5.1)
Sodium: 139 mmol/L (ref 135–145)

## 2023-03-01 NOTE — Progress Notes (Signed)
Cardiology Office Note:  .   Date:  03/01/2023  ID:  Renee Bush, DOB 05-13-1961, MRN [id number] PCP: Miki Kins, FNP  [facility] Providers Cardiologist:  Yvonne Kendall, MD     History of Present Illness: .   Renee Bush is a 62 y.o. female history of PSVT, hypertension, hyperlipidemia, type 2 diabetes mellitus, GERD, and sarcoidosis, who presents for follow-up of lightheadedness and cough.  She reported not feeling well for several weeks at our last visit in November, complaining primarily of lightheadedness and polydipsia.  She also reported mild leg edema and cough.  She reported having been started on clonidine last summer for treatment of hot flashes.  We agreed to wean off clonidine.  We did not pursue any further testing.  Today, Renee Bush reports that she has been doing well, feeling better with discontinuation of clonidine.  Polydipsia and lightheadedness have resolved.  She denies chest pain, shortness of breath, and palpitations.  She does not exercise regularly but is quite active caring for her grandchildren.  She is tolerating her medications well.  ROS: See HPI  Studies Reviewed: Marland Kitchen   EKG Interpretation Date/Time:  Wednesday March 01 2023 11:01:46 EDT Ventricular Rate:  75 PR Interval:  148 QRS Duration:  64 QT Interval:  368 QTC Calculation: 410 R Axis:   -2  Text Interpretation: Normal sinus rhythm Possible Anteroseptal infarct , age undetermined versus lead placement Possible Inferior infarct , age undetermined When compared with ECG of 06-Jul-2022 No significant change was found Confirmed by Nayellie Sanseverino, Cristal Deer [phone]) on 03/01/2023 11:06:26 AM    Cath/PCI: None   EP Procedures and Devices: None   Non-Invasive Evaluation(s): ABI (03/29/2022): Normal ABI and TBI bilaterally. Limited echo (03/29/2022): Normal LV size and wall thickness.  LVEF 60-65% with grade 2 diastolic dysfunction.  Normal RV size and function with mild pulmonary  hypertension (RVSP 35-40 mmHg).  Mild-moderate TR. Coronary CTA (11/25/2021): Mild coronary artery plaquing with less than 25% stenoses in the LMCA, LAD, and RCA.  Coronary calcium score 276 (95th percentile for age and sex matched controls).  Stable mildly prominent subcarinal lymph node. TTE (10/29/2021): Normal LV size and wall thickness.  LVEF 60-65% with grade 1 diastolic dysfunction.  GLS -19%.  Normal RV size and function with upper normal PA pressure.  Normal biatrial size.  No pericardial effusion.  Mild-moderate tricuspid regurgitation.  Risk Assessment/Calculations:             Physical Exam:   VS:  BP 110/60 (BP Location: Left Arm, Patient Position: Sitting, Cuff Size: Normal)   Pulse 75   Ht 5\' 2"  (1.575 m)   Wt 176 lb 6 oz (80 kg)   SpO2 98%   BMI 32.26 kg/m    Wt Readings from Last 3 Encounters:  03/01/23 176 lb 6 oz (80 kg)  01/09/23 173 lb (78.5 kg)  01/05/23 174 lb 6.4 oz (79.1 kg)    General:  NAD. Neck: No JVD or HJR. Lungs: Clear to auscultation bilaterally without wheezes or crackles. Heart: Regular rate and rhythm without murmurs, rubs, or gallops. Abdomen: Soft, nontender, nondistended. Extremities: No lower extremity edema.  ASSESSMENT AND PLAN: .    PSVT: No palpitations reported since our last visit.  Continue metoprolol tartrate 50 mg twice daily.  Hyperlipidemia associated with type 2 diabetes mellitus: LDL reasonable on last check in May at 90, though ideally this would be below 70 given her history of DM and nonobstructive CAD.  Continue  rosuvastatin 5 mg daily for now, though dose escalation may need to be considered in the future if LDL, particularly if LDL rises above 100.  Coronary artery disease: Prior coronary CTA with mild, nonobstructive CAD.  No angina reported.  Continue statin therapy to prevent progression of disease with target LDL less than 100, ideally below 70.  Sarcoidosis: Continue close follow-up with Dr. Jayme Cloud in the pulmonary  clinic.  Hyperkalemia: Most recent labs in May notable for potassium of 5.7.  Ms. Keup is not on a potassium supplement.  We will repeat a renal function panel today.  She is also scheduled for follow-up with Dr. Thedore Mins in the nephrology clinic later today.    Dispo: Return to clinic in 1 year.  Signed, Yvonne Kendall, MD

## 2023-03-01 NOTE — Patient Instructions (Signed)
Medication Instructions:  Your physician recommends that you continue on your current medications as directed. Please refer to the Current Medication list given to you today.  *If you need a refill on your cardiac medications before your next appointment, please call your pharmacy*   Lab Work: Your provider would like for you to have following labs drawn: Renal Function Panel.   Please go to the [facility] entrance and check in at the front desk.  You do not need an appointment.  They are open from 7am-6 pm.   If you have labs (blood work) drawn today and your tests are completely normal, you will receive your results only by: MyChart Message (if you have MyChart) OR A paper copy in the mail If you have any lab test that is abnormal or we need to change your treatment, we will call you to review the results.   Testing/Procedures: none   Follow-Up: At [facility], you and your health needs are our priority.  As part of our continuing mission to provide you with exceptional heart care, we have created designated Provider Care Teams.  These Care Teams include your primary Cardiologist (physician) and Advanced Practice Providers (APPs -  Physician Assistants and Nurse Practitioners) who all work together to provide you with the care you need, when you need it.  We recommend signing up for the patient portal called "MyChart".  Sign up information is provided on this After Visit Summary.  MyChart is used to connect with patients for Virtual Visits (Telemedicine).  Patients are able to view lab/test results, encounter notes, upcoming appointments, etc.  Non-urgent messages can be sent to your provider as well.   To learn more about what you can do with MyChart, go to [email or web address].    Your next appointment:   1 year(s)  Provider:   You may see Yvonne Kendall, MD or one of the following Advanced Practice Providers on your designated Care Team:   Nicolasa Ducking, NP Eula Listen, PA-C Cadence Fransico Michael, PA-C Charlsie Quest, NP {

## 2023-03-21 ENCOUNTER — Other Ambulatory Visit: Payer: Self-pay | Admitting: Family

## 2023-03-21 DIAGNOSIS — E1140 Type 2 diabetes mellitus with diabetic neuropathy, unspecified: Secondary | ICD-10-CM

## 2023-04-11 ENCOUNTER — Ambulatory Visit (INDEPENDENT_AMBULATORY_CARE_PROVIDER_SITE_OTHER): Payer: Medicaid Other | Admitting: Family

## 2023-04-11 VITALS — BP 130/70 | HR 75 | Ht 62.0 in | Wt 175.2 lb

## 2023-04-11 DIAGNOSIS — E1169 Type 2 diabetes mellitus with other specified complication: Secondary | ICD-10-CM | POA: Diagnosis not present

## 2023-04-11 DIAGNOSIS — J069 Acute upper respiratory infection, unspecified: Secondary | ICD-10-CM

## 2023-04-11 DIAGNOSIS — I10 Essential (primary) hypertension: Secondary | ICD-10-CM | POA: Diagnosis not present

## 2023-04-11 DIAGNOSIS — D509 Iron deficiency anemia, unspecified: Secondary | ICD-10-CM

## 2023-04-11 DIAGNOSIS — E1140 Type 2 diabetes mellitus with diabetic neuropathy, unspecified: Secondary | ICD-10-CM

## 2023-04-11 DIAGNOSIS — F331 Major depressive disorder, recurrent, moderate: Secondary | ICD-10-CM

## 2023-04-11 DIAGNOSIS — N1831 Chronic kidney disease, stage 3a: Secondary | ICD-10-CM

## 2023-04-11 DIAGNOSIS — R5383 Other fatigue: Secondary | ICD-10-CM

## 2023-04-11 DIAGNOSIS — E785 Hyperlipidemia, unspecified: Secondary | ICD-10-CM

## 2023-04-11 DIAGNOSIS — E538 Deficiency of other specified B group vitamins: Secondary | ICD-10-CM | POA: Diagnosis not present

## 2023-04-11 DIAGNOSIS — E559 Vitamin D deficiency, unspecified: Secondary | ICD-10-CM

## 2023-04-11 DIAGNOSIS — D869 Sarcoidosis, unspecified: Secondary | ICD-10-CM

## 2023-04-11 DIAGNOSIS — Z794 Long term (current) use of insulin: Secondary | ICD-10-CM

## 2023-04-11 NOTE — Progress Notes (Signed)
Established Patient Office Visit  Subjective:  Patient ID: Renee Bush, female    DOB: April 23, 1961  Age: 62 y.o. MRN: [id number]  Chief Complaint  Patient presents with   Follow-up    3 MO F/U    Patient is here today for her 3 months follow up.  She has been feeling well since last appointment.   She does have additional concerns to discuss today.    URI  This is a new problem. The current episode started 1 to 4 weeks ago. The problem has been gradually worsening. There has been no fever. Associated symptoms include coughing, headaches, rhinorrhea, sinus pain and a sore throat. She has tried acetaminophen, decongestant, antihistamine, eating, NSAIDs, sleep, increased fluids and inhaler use for the symptoms. The treatment provided no relief.   Labs are due today. She needs refills.   I have reviewed her active problem list, medication list, allergies, notes from last encounter, lab results for her appointment today.    No other concerns at this time.   Past Medical History:  Diagnosis Date   AKI (acute kidney injury) (HCC) 07/07/2022   Antibiotic-induced yeast infection 01/09/2020   Balance problem 12/18/2019   Blood in stool 12/28/2021   Chest pain 10/14/2021   Chronic kidney disease, stage III (moderate) (HCC) 10/06/2017   Constipation 07/13/2021   Diabetes mellitus without complication (HCC)    DKA, type 2 (HCC) 04/16/2022   ETOH abuse 04/18/2015   Family history of colonic polyps    GERD (gastroesophageal reflux disease)    History of allergy 06/11/2019   Hospital discharge follow-up 01/06/2022   Hypercholesteremia    Hypertension 09/10/2015   Hypokalemia 04/17/2022   Palpitations 10/28/2021   Persistent dry cough 11/16/2021   Sarcoidosis 10/12/2021   self-reported   Shortness of breath 10/28/2021   Urinary frequency 07/02/2020   Urinary tract infection 11/16/2021    Past Surgical History:  Procedure Laterality Date   APPENDECTOMY     CESAREAN  SECTION  1991   COLONOSCOPY WITH PROPOFOL N/A 06/03/2019   Procedure: COLONOSCOPY WITH PROPOFOL;  Surgeon: Wyline Mood, MD;  Location: [facility] ENDOSCOPY;  Service: Gastroenterology;  Laterality: N/A;    Social History   Socioeconomic History   Marital status: Married    Spouse name: Channing Mutters   Number of children: 4   Years of education: Not on file   Highest education level: 11th grade  Occupational History   Occupation: na  Tobacco Use   Smoking status: Former    Current packs/day: 0.00    Average packs/day: 1 pack/day for 5.0 years (5.0 ttl pk-yrs)    Types: Cigarettes    Start date: 04/17/2000    Quit date: 04/17/2005    Years since quitting: 18.0   Smokeless tobacco: Never  Vaping Use   Vaping status: Never Used  Substance and Sexual Activity   Alcohol use: Not Currently    Alcohol/week: 14.0 standard drinks of alcohol    Types: 14 Cans of beer per week    Comment: quit ~2020   Drug use: No   Sexual activity: Not Currently  Other Topics Concern   Not on file  Social History Narrative   Lives in mobile home paid for   Social Determinants of Health   Financial Resource Strain: Low Risk  (05/19/2020)   Overall Financial Resource Strain (CARDIA)    Difficulty of Paying Living Expenses: Not hard at all  Food Insecurity: No Food Insecurity (05/11/2021)   Hunger Vital Sign  Worried About Programme researcher, broadcasting/film/video in the Last Year: Never true    Ran Out of Food in the Last Year: Never true  Transportation Needs: No Transportation Needs (05/11/2021)   PRAPARE - Administrator, Civil Service (Medical): No    Lack of Transportation (Non-Medical): No  Physical Activity: Insufficiently Active (05/19/2020)   Exercise Vital Sign    Days of Exercise per Week: 7 days    Minutes of Exercise per Session: 10 min  Stress: Stress Concern Present (05/28/2020)   [employer] of Occupational Health - Occupational Stress Questionnaire    Feeling of Stress : To some extent  Social  Connections: Moderately Isolated (05/28/2020)   Social Connection and Isolation Panel [NHANES]    Frequency of Communication with Friends and Family: More than three times a week    Frequency of Social Gatherings with Friends and Family: More than three times a week    Attends Religious Services: Never    Database administrator or Organizations: No    Attends Banker Meetings: Never    Marital Status: Married  Catering manager Violence: Not At Risk (05/19/2020)   Humiliation, Afraid, Rape, and Kick questionnaire    Fear of Current or Ex-Partner: No    Emotionally Abused: No    Physically Abused: No    Sexually Abused: No    Family History  Problem Relation Age of Onset   COPD Mother    Alzheimer's disease Mother    Hypertension Father    Hyperlipidemia Father    Heart attack Father    Diabetes type II Father    Lupus Father    Diabetes type II Sister    Diabetes type II Sister    Diabetes type II Sister    Diabetes type II Sister    Gestational diabetes Daughter     Allergies  Allergen Reactions   Etodolac Rash   Penicillin G Rash   Penicillins Rash    Has patient had a PCN reaction causing immediate rash, facial/tongue/throat swelling, SOB or lightheadedness with hypotension: Yes  Has patient had a PCN reaction causing severe rash involving mucus membranes or skin necrosis: No  Has patient had a PCN reaction that required hospitalization: No  Has patient had a PCN reaction occurring within the last 10 years: No  If all of the above answers are "NO", then may proceed with Cephalosporin use.  Has patient had a PCN reaction causing immediate rash, facial/tongue/throat swelling, SOB or lightheadedness with hypotension: Yes Has patient had a PCN reaction causing severe rash involving mucus membranes or skin necrosis: No Has patient had a PCN reaction that required hospitalization: No Has patient had a PCN reaction occurring within the last 10 years: No If all of  the above answers are "NO", then may proceed with Cephalosporin use.    Review of Systems  HENT:  Positive for rhinorrhea, sinus pain and sore throat.   Respiratory:  Positive for cough.   Neurological:  Positive for headaches.  All other systems reviewed and are negative.      Objective:   BP 130/70   Pulse 75   Ht 5\' 2"  (1.575 m)   Wt 175 lb 3.2 oz (79.5 kg)   SpO2 98%   BMI 32.04 kg/m   Vitals:   04/11/23 1003  BP: 130/70  Pulse: 75  Height: 5\' 2"  (1.575 m)  Weight: 175 lb 3.2 oz (79.5 kg)  SpO2: 98%  BMI (Calculated): 32.04  Physical Exam Vitals and nursing note reviewed.  Constitutional:      Appearance: Normal appearance. She is obese.  HENT:     Head: Normocephalic.     Right Ear: Tympanic membrane and external ear normal.     Left Ear: Tympanic membrane and external ear normal.     Nose: Nasal tenderness, mucosal edema, congestion and rhinorrhea present.  Eyes:     Pupils: Pupils are equal, round, and reactive to light.  Cardiovascular:     Rate and Rhythm: Normal rate.  Pulmonary:     Effort: Pulmonary effort is normal.  Neurological:     Mental Status: She is alert.      Results for orders placed or performed in visit on 04/11/23  Lipid panel  Result Value Ref Range   Cholesterol, Total 120 100 - 199 mg/dL   Triglycerides 782 (H) 0 - 149 mg/dL   HDL 28 (L) >95 mg/dL   VLDL Cholesterol Cal 28 5 - 40 mg/dL   LDL Chol Calc (NIH) 64 0 - 99 mg/dL   Chol/HDL Ratio 4.3 0.0 - 4.4 ratio  VITAMIN D 25 Hydroxy (Vit-D Deficiency, Fractures)  Result Value Ref Range   Vit D, 25-Hydroxy 64.6 30.0 - 100.0 ng/mL  CMP14+EGFR  Result Value Ref Range   Glucose 170 (H) 70 - 99 mg/dL   BUN 18 8 - 27 mg/dL   Creatinine, Ser 6.21 (H) 0.57 - 1.00 mg/dL   eGFR 52 (L) >30 QM/VHQ/4.69   BUN/Creatinine Ratio 15 12 - 28   Sodium 137 134 - 144 mmol/L   Potassium 5.6 (H) 3.5 - 5.2 mmol/L   Chloride 104 96 - 106 mmol/L   CO2 23 20 - 29 mmol/L   Calcium 8.7 8.7 -  10.3 mg/dL   Total Protein 5.0 (L) 6.0 - 8.5 g/dL   Albumin 3.4 (L) 3.9 - 4.9 g/dL   Globulin, Total 1.6 1.5 - 4.5 g/dL   Bilirubin Total <6.2 0.0 - 1.2 mg/dL   Alkaline Phosphatase 74 44 - 121 IU/L   AST 16 0 - 40 IU/L   ALT 19 0 - 32 IU/L  TSH  Result Value Ref Range   TSH 1.650 0.450 - 4.500 uIU/mL  Hemoglobin A1c  Result Value Ref Range   Hgb A1c MFr Bld 7.3 (H) 4.8 - 5.6 %   Est. average glucose Bld gHb Est-mCnc 163 mg/dL  Vitamin X52  Result Value Ref Range   Vitamin B-12 326 232 - 1,245 pg/mL    Recent Results (from the past 2160 hour(s))  Renal Function Panel     Status: Abnormal   Collection Time: 03/01/23 11:50 AM  Result Value Ref Range   Sodium 139 135 - 145 mmol/L   Potassium 4.5 3.5 - 5.1 mmol/L   Chloride 106 98 - 111 mmol/L   CO2 26 22 - 32 mmol/L   Glucose, Bld 90 70 - 99 mg/dL    Comment: Glucose reference range applies only to samples taken after fasting for at least 8 hours.   BUN 20 8 - 23 mg/dL   Creatinine, Ser 8.41 (H) 0.44 - 1.00 mg/dL   Calcium 8.8 (L) 8.9 - 10.3 mg/dL   Phosphorus 3.7 2.5 - 4.6 mg/dL   Albumin 2.9 (L) 3.5 - 5.0 g/dL   GFR, Estimated 57 (L) >60 mL/min    Comment: (NOTE) Calculated using the CKD-EPI Creatinine Equation (2021)    Anion gap 7 5 - 15    Comment: Performed at  [facility], [address] [place], [place] [address]  Lipid panel     Status: Abnormal   Collection Time: 04/11/23 10:55 AM  Result Value Ref Range   Cholesterol, Total 120 100 - 199 mg/dL   Triglycerides 604 (H) 0 - 149 mg/dL   HDL 28 (L) >54 mg/dL   VLDL Cholesterol Cal 28 5 - 40 mg/dL   LDL Chol Calc (NIH) 64 0 - 99 mg/dL   Chol/HDL Ratio 4.3 0.0 - 4.4 ratio    Comment:                                   T. Chol/HDL Ratio                                             Men  Women                               1/2 Avg.Risk  3.4    3.3                                   Avg.Risk  5.0    4.4                                2X Avg.Risk  9.6     7.1                                3X Avg.Risk 23.4   11.0   VITAMIN D 25 Hydroxy (Vit-D Deficiency, Fractures)     Status: None   Collection Time: 04/11/23 10:55 AM  Result Value Ref Range   Vit D, 25-Hydroxy 64.6 30.0 - 100.0 ng/mL    Comment: Vitamin D deficiency has been defined by the Institute of Medicine and an Endocrine Society practice guideline as a level of serum 25-OH vitamin D less than 20 ng/mL (1,2). The Endocrine Society went on to further define vitamin D insufficiency as a level between 21 and 29 ng/mL (2). 1. IOM (Institute of Medicine). 2010. Dietary reference    intakes for calcium and D. [place]: The    [employer]. 2. Holick MF, Binkley [place], Bischoff-Ferrari HA, et al.    Evaluation, treatment, and prevention of vitamin D    deficiency: an Endocrine Society clinical practice    guideline. JCEM. 2011 Jul; 96(7):1911-30.   CMP14+EGFR     Status: Abnormal   Collection Time: 04/11/23 10:55 AM  Result Value Ref Range   Glucose 170 (H) 70 - 99 mg/dL   BUN 18 8 - 27 mg/dL   Creatinine, Ser 0.98 (H) 0.57 - 1.00 mg/dL   eGFR 52 (L) >11 BJ/YNW/2.95   BUN/Creatinine Ratio 15 12 - 28   Sodium 137 134 - 144 mmol/L   Potassium 5.6 (H) 3.5 - 5.2 mmol/L   Chloride 104 96 - 106 mmol/L   CO2 23 20 - 29 mmol/L   Calcium 8.7 8.7 - 10.3 mg/dL   Total Protein 5.0 (L) 6.0 - 8.5 g/dL   Albumin 3.4 (  L) 3.9 - 4.9 g/dL   Globulin, Total 1.6 1.5 - 4.5 g/dL   Bilirubin Total <8.6 0.0 - 1.2 mg/dL   Alkaline Phosphatase 74 44 - 121 IU/L   AST 16 0 - 40 IU/L   ALT 19 0 - 32 IU/L  TSH     Status: None   Collection Time: 04/11/23 10:55 AM  Result Value Ref Range   TSH 1.650 0.450 - 4.500 uIU/mL  Hemoglobin A1c     Status: Abnormal   Collection Time: 04/11/23 10:55 AM  Result Value Ref Range   Hgb A1c MFr Bld 7.3 (H) 4.8 - 5.6 %    Comment:          Prediabetes: 5.7 - 6.4          Diabetes: >6.4          Glycemic control for adults with diabetes: <7.0    Est.  average glucose Bld gHb Est-mCnc 163 mg/dL  Vitamin V78     Status: None   Collection Time: 04/11/23 10:55 AM  Result Value Ref Range   Vitamin B-12 326 232 - 1,245 pg/mL  CMP14+EGFR     Status: Abnormal   Collection Time: 04/19/23  9:40 AM  Result Value Ref Range   Glucose 161 (H) 70 - 99 mg/dL   BUN 20 8 - 27 mg/dL   Creatinine, Ser 4.69 (H) 0.57 - 1.00 mg/dL   eGFR 56 (L) >62 XB/MWU/1.32   BUN/Creatinine Ratio 18 12 - 28   Sodium 138 134 - 144 mmol/L   Potassium 5.2 3.5 - 5.2 mmol/L   Chloride 104 96 - 106 mmol/L   CO2 24 20 - 29 mmol/L   Calcium 8.4 (L) 8.7 - 10.3 mg/dL   Total Protein 4.9 (L) 6.0 - 8.5 g/dL   Albumin 3.3 (L) 3.9 - 4.9 g/dL   Globulin, Total 1.6 1.5 - 4.5 g/dL   Bilirubin Total 0.3 0.0 - 1.2 mg/dL   Alkaline Phosphatase 64 44 - 121 IU/L   AST 36 0 - 40 IU/L   ALT 35 (H) 0 - 32 IU/L       Assessment & Plan:   Problem List Items Addressed This Visit       Active Problems   Type 2 diabetes mellitus with diabetic neuropathy, with long-term current use of insulin (HCC) - Primary    Checking labs today. Will call pt. With results  Continue current diabetes POC, as patient has been well controlled on current regimen.  Will adjust meds if needed based on labs.        Relevant Orders   CMP14+EGFR (Completed)   Hemoglobin A1c (Completed)   CBC with Differential/Platelet   Essential hypertension    Blood pressure well controlled with current medications.  Continue current therapy.  Will reassess at follow up.        Relevant Orders   CMP14+EGFR (Completed)   CBC with Differential/Platelet   Hyperlipidemia associated with type 2 diabetes mellitus (HCC)    Checking labs today.  Continue current therapy for lipid control. Will modify as needed based on labwork results.       Relevant Orders   Lipid panel (Completed)   CMP14+EGFR (Completed)   CBC with Differential/Platelet   B12 deficiency    Checking labs today.  Will continue supplements as  needed.        Relevant Orders   CMP14+EGFR (Completed)   Vitamin B12 (Completed)   CBC with Differential/Platelet   Chronic  kidney disease, stage III (moderate) (HCC)    Patient is seen by Nephrology, who manage this condition.  She is well controlled with current therapy.   Will defer to them for further changes to plan of care.       Sarcoidosis    Patient stable.  Well controlled with current therapy.   Continue current meds.       Relevant Orders   CMP14+EGFR (Completed)   CBC with Differential/Platelet   Iron deficiency anemia   Relevant Orders   CMP14+EGFR (Completed)   CBC with Differential/Platelet   Moderate episode of recurrent major depressive disorder (HCC)    Patient stable.  Well controlled with current therapy.   Continue current meds.       Other Visit Diagnoses     Vitamin D deficiency, unspecified       Checking labs today.  Will continue supplements as needed.   Relevant Orders   VITAMIN D 25 Hydroxy (Vit-D Deficiency, Fractures) (Completed)   CMP14+EGFR (Completed)   CBC with Differential/Platelet   Other fatigue       Relevant Orders   TSH (Completed)   Upper respiratory tract infection, unspecified type       Will send meds for patient as needed. She will let me know if not improved in the next week or so.       Return in about 3 months (around 07/12/2023).   Total time spent: 30 minutes  Miki Kins, FNP  04/11/2023   This document may have been prepared by [facility] Voice Recognition software and as such may include unintentional dictation errors.

## 2023-04-13 ENCOUNTER — Ambulatory Visit: Payer: Medicaid Other | Admitting: Pulmonary Disease

## 2023-04-14 ENCOUNTER — Encounter: Payer: Self-pay | Admitting: Family

## 2023-04-17 ENCOUNTER — Other Ambulatory Visit: Payer: Self-pay

## 2023-04-17 DIAGNOSIS — I10 Essential (primary) hypertension: Secondary | ICD-10-CM

## 2023-04-19 ENCOUNTER — Other Ambulatory Visit: Payer: Medicaid Other

## 2023-04-19 ENCOUNTER — Ambulatory Visit (INDEPENDENT_AMBULATORY_CARE_PROVIDER_SITE_OTHER): Payer: Medicaid Other | Admitting: Family

## 2023-04-19 ENCOUNTER — Other Ambulatory Visit: Payer: Self-pay | Admitting: Family

## 2023-04-19 DIAGNOSIS — E538 Deficiency of other specified B group vitamins: Secondary | ICD-10-CM

## 2023-04-19 MED ORDER — CYANOCOBALAMIN 1000 MCG/ML IJ SOLN
1000.0000 ug | Freq: Once | INTRAMUSCULAR | Status: AC
Start: 2023-04-19 — End: 2023-04-19
  Administered 2023-04-19: 1000 ug via INTRAMUSCULAR

## 2023-04-19 NOTE — Progress Notes (Signed)
   CHIEF COMPLAINT  B12 Shot     REASON FOR VISIT  B12 Injection     ASSESSMENT  B12 Deficiency, Unspecified     PLAN  Diagnoses and all orders for this visit:  B12 deficiency -     cyanocobalamin (VITAMIN B12) injection 1,000 mcg     Pt. given B12 injection in clinic.  Return for next injection per provider instructions.   Total time spent: 5 minutes  Miki Kins, FNP  04/19/2023

## 2023-04-20 LAB — CMP14+EGFR
ALT: 35 IU/L — ABNORMAL HIGH (ref 0–32)
AST: 36 IU/L (ref 0–40)
Albumin: 3.3 g/dL — ABNORMAL LOW (ref 3.9–4.9)
Alkaline Phosphatase: 64 IU/L (ref 44–121)
BUN/Creatinine Ratio: 18 (ref 12–28)
BUN: 20 mg/dL (ref 8–27)
Bilirubin Total: 0.3 mg/dL (ref 0.0–1.2)
CO2: 24 mmol/L (ref 20–29)
Calcium: 8.4 mg/dL — ABNORMAL LOW (ref 8.7–10.3)
Chloride: 104 mmol/L (ref 96–106)
Creatinine, Ser: 1.11 mg/dL — ABNORMAL HIGH (ref 0.57–1.00)
Globulin, Total: 1.6 g/dL (ref 1.5–4.5)
Glucose: 161 mg/dL — ABNORMAL HIGH (ref 70–99)
Potassium: 5.2 mmol/L (ref 3.5–5.2)
Sodium: 138 mmol/L (ref 134–144)
Total Protein: 4.9 g/dL — ABNORMAL LOW (ref 6.0–8.5)
eGFR: 56 mL/min/{1.73_m2} — ABNORMAL LOW (ref >59)

## 2023-04-29 ENCOUNTER — Encounter: Payer: Self-pay | Admitting: Family

## 2023-04-29 NOTE — Assessment & Plan Note (Signed)
Checking labs today. Will call pt. With results  Continue current diabetes POC, as patient has been well controlled on current regimen.  Will adjust meds if needed based on labs.  

## 2023-04-29 NOTE — Assessment & Plan Note (Signed)
Checking labs today.  Will continue supplements as needed.  

## 2023-04-29 NOTE — Assessment & Plan Note (Signed)
Patient is seen by Nephrology, who manage this condition.  She is well controlled with current therapy.   Will defer to them for further changes to plan of care.

## 2023-04-29 NOTE — Assessment & Plan Note (Signed)
Checking labs today.  Continue current therapy for lipid control. Will modify as needed based on labwork results.  

## 2023-04-29 NOTE — Assessment & Plan Note (Signed)
Blood pressure well controlled with current medications.  Continue current therapy.  Will reassess at follow up.  

## 2023-04-29 NOTE — Assessment & Plan Note (Signed)
Patient stable.  Well controlled with current therapy.   Continue current meds.  

## 2023-05-02 ENCOUNTER — Other Ambulatory Visit: Payer: Self-pay | Admitting: Family

## 2023-05-03 ENCOUNTER — Other Ambulatory Visit: Payer: Self-pay | Admitting: Family

## 2023-05-05 ENCOUNTER — Encounter: Payer: Self-pay | Admitting: Intensive Care

## 2023-05-05 ENCOUNTER — Other Ambulatory Visit: Payer: Self-pay

## 2023-05-05 ENCOUNTER — Emergency Department
Admission: EM | Admit: 2023-05-05 | Discharge: 2023-05-05 | Disposition: A | Discharge status: Home / Self Care | Payer: Medicaid Other | Attending: Emergency Medicine | Admitting: Emergency Medicine

## 2023-05-05 DIAGNOSIS — Z23 Encounter for immunization: Secondary | ICD-10-CM | POA: Diagnosis not present

## 2023-05-05 DIAGNOSIS — I10 Essential (primary) hypertension: Secondary | ICD-10-CM | POA: Insufficient documentation

## 2023-05-05 DIAGNOSIS — S6992XA Unspecified injury of left wrist, hand and finger(s), initial encounter: Secondary | ICD-10-CM | POA: Diagnosis present

## 2023-05-05 DIAGNOSIS — S61211A Laceration without foreign body of left index finger without damage to nail, initial encounter: Secondary | ICD-10-CM | POA: Diagnosis not present

## 2023-05-05 DIAGNOSIS — E119 Type 2 diabetes mellitus without complications: Secondary | ICD-10-CM | POA: Insufficient documentation

## 2023-05-05 DIAGNOSIS — W260XXA Contact with knife, initial encounter: Secondary | ICD-10-CM | POA: Insufficient documentation

## 2023-05-05 MED ORDER — TETANUS-DIPHTH-ACELL PERTUSSIS 5-2.5-18.5 LF-MCG/0.5 IM SUSY
0.5000 mL | PREFILLED_SYRINGE | Freq: Once | INTRAMUSCULAR | Status: AC
  Administered 2023-05-05: 0.5 mL via INTRAMUSCULAR
  Filled 2023-05-05: qty 0.5

## 2023-05-05 MED ORDER — LIDOCAINE HCL (PF) 1 % IJ SOLN
5.0000 mL | Freq: Once | INTRAMUSCULAR | Status: DC
  Filled 2023-05-05: qty 5

## 2023-05-05 NOTE — ED Triage Notes (Signed)
Patient presents with left hand, index finger laceration. Injury happened today. Patient cut finger with knife

## 2023-05-05 NOTE — ED Provider Notes (Signed)
[facility] Emergency Department Provider Note     Event Date/Time   First MD Initiated Contact with Patient 05/05/23 1416     (approximate)   History   Laceration   HPI  Renee Bush is a 62 y.o. female with a history of diabetes, HTN, HLD, and GERD, presents to the ED for a laceration to the left index finger.  Patient was using a knife and accidentally cut the finger.  No other injury reported at this time.   Physical Exam   Triage Vital Signs: ED Triage Vitals  Encounter Vitals Group     BP 05/05/23 1311 127/84     Systolic BP Percentile --      Diastolic BP Percentile --      Pulse Rate 05/05/23 1311 85     Resp 05/05/23 1311 18     Temp 05/05/23 1311 98.4 F (36.9 C)     Temp Source 05/05/23 1311 Oral     SpO2 05/05/23 1311 100 %     Weight 05/05/23 1312 174 lb (78.9 kg)     Height 05/05/23 1312 5\' 2"  (1.575 m)     Head Circumference --      Peak Flow --      Pain Score 05/05/23 1316 7     Pain Loc --      Pain Education --      Exclude from Growth Chart --     Most recent vital signs: Vitals:   05/05/23 1311  BP: 127/84  Pulse: 85  Resp: 18  Temp: 98.4 F (36.9 C)  SpO2: 100%    General Awake, no distress. NAD CV:  Good peripheral perfusion.  RESP:  Normal effort.  ABD:  No distention.  MSK:  Normal composite fist on the left.  Dorsal laceration of the middle phalanx of the left index finger.  No active bleeding noted at this time.  Wound edges are well-approximated.  No nail injury noted.  ED Results / Procedures / Treatments   Labs (all labs ordered are listed, but only abnormal results are displayed) Labs Reviewed - No data to display   EKG   RADIOLOGY  No results found.   PROCEDURES:  Critical Care performed: No  ..Laceration Repair  Date/Time: 05/05/2023 2:30 PM  Performed by: Lissa Hoard, PA-C Authorized by: Lissa Hoard, PA-C   Consent:    Consent obtained:   Verbal   Consent given by:  Patient   Risks discussed:  Pain and poor wound healing   Alternatives discussed:  No treatment Universal protocol:    Site/side marked: yes     Patient identity confirmed:  Verbally with patient Anesthesia:    Anesthesia method:  None Laceration details:    Location:  Finger   Finger location:  L index finger   Length (cm):  1   Depth (mm):  3 Exploration:    Limited defect created (wound extended): no     Contaminated: no   Treatment:    Area cleansed with:  Saline   Amount of cleaning:  Standard   Irrigation solution:  Sterile saline   Irrigation volume:  5   Irrigation method:  Syringe   Visualized foreign bodies/material removed: no     Debridement:  None   Undermining:  None   Scar revision: no   Skin repair:    Repair method:  Tissue adhesive Approximation:    Approximation:  Close Repair type:  Repair type:  Simple Post-procedure details:    Dressing:  Non-adherent dressing   Procedure completion:  Tolerated well, no immediate complications    MEDICATIONS ORDERED IN ED: Medications  lidocaine (PF) (XYLOCAINE) 1 % injection 5 mL (5 mLs Infiltration Not Given 05/05/23 1439)  Tdap (BOOSTRIX) injection 0.5 mL (has no administration in time range)     IMPRESSION / MDM / ASSESSMENT AND PLAN / ED COURSE  I reviewed the triage vital signs and the nursing notes.                              Differential diagnosis includes, but is not limited to, finger laceration, nail avulsion, subungual hemorrhage, crush injury, finger amputation  Patient's presentation is most consistent with acute, uncomplicated illness.  Patient's diagnosis is consistent with left finger laceration. Patient will be discharged home with wound care instructions.  Tetanus booster was updated at this visit.  Patient is to follow up with primary provider, as needed or otherwise directed. Patient is given ED precautions to return to the ED for any worsening or new  symptoms.  FINAL CLINICAL IMPRESSION(S) / ED DIAGNOSES   Final diagnoses:  Laceration of left index finger without foreign body without damage to nail, initial encounter     Rx / DC Orders   ED Discharge Orders     None        Note:  This document was prepared using Dragon voice recognition software and may include unintentional dictation errors.    Lissa Hoard, PA-C 05/05/23 1442    Merwyn Katos, MD 05/05/23 [phone]

## 2023-05-05 NOTE — Discharge Instructions (Addendum)
Keep the wound clean, dry, and covered.  Avoid any lotion, cream, ointment over the glued finger.

## 2023-05-08 NOTE — Group Note (Deleted)

## 2023-05-22 ENCOUNTER — Ambulatory Visit (INDEPENDENT_AMBULATORY_CARE_PROVIDER_SITE_OTHER): Payer: Medicaid Other | Admitting: Family

## 2023-05-22 DIAGNOSIS — E538 Deficiency of other specified B group vitamins: Secondary | ICD-10-CM

## 2023-05-22 MED ORDER — CYANOCOBALAMIN 1000 MCG/ML IJ SOLN
1000.0000 ug | Freq: Once | INTRAMUSCULAR | Status: AC
Start: 2023-05-22 — End: 2023-05-22
  Administered 2023-05-22: 1000 ug via INTRAMUSCULAR

## 2023-05-25 ENCOUNTER — Other Ambulatory Visit: Payer: Self-pay | Admitting: Family

## 2023-05-30 ENCOUNTER — Encounter: Payer: Self-pay | Admitting: Pulmonary Disease

## 2023-05-30 ENCOUNTER — Other Ambulatory Visit
Admission: RE | Admit: 2023-05-30 | Discharge: 2023-05-30 | Disposition: A | Discharge status: Home / Self Care | Payer: Medicaid Other | Attending: Pulmonary Disease | Admitting: Pulmonary Disease

## 2023-05-30 ENCOUNTER — Ambulatory Visit: Payer: Medicaid Other | Admitting: Pulmonary Disease

## 2023-05-30 VITALS — BP 110/64 | HR 82 | Temp 98.4°F | Ht 62.0 in | Wt 170.8 lb

## 2023-05-30 DIAGNOSIS — D869 Sarcoidosis, unspecified: Secondary | ICD-10-CM | POA: Diagnosis not present

## 2023-05-30 DIAGNOSIS — Z23 Encounter for immunization: Secondary | ICD-10-CM

## 2023-05-30 DIAGNOSIS — D521 Drug-induced folate deficiency anemia: Secondary | ICD-10-CM

## 2023-05-30 DIAGNOSIS — R591 Generalized enlarged lymph nodes: Secondary | ICD-10-CM | POA: Diagnosis not present

## 2023-05-30 DIAGNOSIS — Z5181 Encounter for therapeutic drug level monitoring: Secondary | ICD-10-CM

## 2023-05-30 DIAGNOSIS — J683 Other acute and subacute respiratory conditions due to chemicals, gases, fumes and vapors: Secondary | ICD-10-CM | POA: Diagnosis not present

## 2023-05-30 DIAGNOSIS — N183 Chronic kidney disease, stage 3 unspecified: Secondary | ICD-10-CM

## 2023-05-30 NOTE — Patient Instructions (Signed)
We are checking blood work today to check on your level of folate which is a vitamin and also on the ACE level which is what is a reflection of your sarcoidosis activity.  Continue taking the methotrexate at 3 pills once a week.  We will see you in follow-up in 3 months time, call sooner should any new problems arise.

## 2023-05-30 NOTE — Progress Notes (Signed)
Subjective:    Patient ID: Renee Bush, female    DOB: 07-30-1961, 62 y.o.   MRN: [id number]  Patient Care Team: Miki Kins, FNP as PCP - General (Family Medicine) End, Cristal Deer, MD as PCP - Cardiology (Cardiology) Salena Saner, MD as Consulting Physician (Pulmonary Disease)  Chief Complaint  Patient presents with   Follow-up    No SOB, wheezing or cough.    HPI Signe is a 62 year old remote former smoker (minimal smoking exposure) who presents for follow-up on the issue of sarcoidosis. This is a scheduled visit.  She was last seen here on 63 May 2024 at that time she was continued on low-dose methotrexate. Methotrexate was started on 01 June 2022 due to evidence of increased retroperitoneal and porta hepatic lymph node involvement. The patient was initially seen by me on 12 October 2021 for abnormal chest CT with mediastinal and hilar adenopathy.  This was hypermetabolic on PET/CT.  A lymph node biopsy of the right supraclavicular area ensued and it was consistent with sarcoidosis. Initially she was treated with low-dose steroids however, she has significant diabetes and has had issues with diabetic ketoacidosis so steroids are being avoided.  She has been taking 7.5 mg of methotrexate once a week on Sundays, as noted this was started on 01 June 2022.  She has been tolerating this medication.  She was started at the lower dose due to mild AKI noted.  Repeat basic metabolic panel performed by primary care on 19 April 2023 shows that her renal function on this medication is stable.  Of note, the patient also had hypercalcemia that resolved on the methotrexate.   She has been doing well, she has had no dyspnea, wheezing or cough.  She has not had any fevers, chills or sweats. She does not endorse any other complaint.  She is coming on a year of being on methotrexate.    TEST/EVENTS  October 04, 2021 Right supraclavicular lymph node biopsy showed nonnecrotizing  granulomatous inflammation  September 22, 2021 CT chest negative for PE, mediastinal and right hilar adenopathy small nodes are present at the porta hepatis,  September 29, 2021 PET scan showed hypermetabolic lymphadenopathy in both hilar regions in the mediastinal and associated hypermetabolic lymph nodes in the right supraclavicular region and left thoracic inlet upper portal lymph nodes in the abdomen show low-level hypermetabolism October 29, 2021 2D echo showed EF at 60-65%, grade 1 diastolic dysfunction, normal pulmonary artery systolic pressure, normal right ventricular size.  October 28, 2021 PFTs showed normal lung function with mild bronchodilator response FEV1 101%, ratio 82, FVC 95%, 10% bronchodilator change, DLCO 104%. February 08, 2022 CT chest: Prominent/enlarged thoracic lymph nodes creased in size from prior exam there is porta hepatic lymph node within upper abdomen which has increased in size from prior exams findings are nonspecific and may be related to the history of sarcoid.  4 mm left solid pulmonary nodule. 13 April 2022 CT abdomen and pelvis: Increased size of the retroperitoneal and porta hepatic lymph nodes of the upper abdomen. 01 June 2022 ACE level:133 U/L (Ref.14-82 U/L) 01 June 2022: Methotrexate initiated, 7.5 mg weekly 30 August 2022 ACE level: 84 U/L 25 November 2022 ACE level: 79 U/L 05 Jan 2023 ACE level: 80 U/L    Review of Systems A 10 point review of systems was performed and it is as noted above otherwise negative.   Patient Active Problem List   Diagnosis Date Noted   Coronary artery disease involving  native coronary artery of native heart without angina pectoris 03/01/2023   Hyperkalemia 03/01/2023   Iron deficiency anemia 10/10/2022   Moderate episode of recurrent major depressive disorder (HCC) 10/10/2022   Sarcoidosis 11/12/2021   PSVT (paroxysmal supraventricular tachycardia) (HCC) 10/28/2021   Bilateral edema of lower extremity 05/16/2019   Generalized  anxiety disorder 02/07/2019   Insomnia 10/06/2017   GERD (gastroesophageal reflux disease) 10/06/2017   Chronic kidney disease, stage III (moderate) (HCC) 10/06/2017   B12 deficiency 06/09/2016   Hyperlipidemia associated with type 2 diabetes mellitus (HCC) 05/05/2016   Type 2 diabetes mellitus with diabetic neuropathy, with long-term current use of insulin (HCC) 09/10/2015   Essential hypertension 09/10/2015    Social History   Tobacco Use   Smoking status: Former    Current packs/day: 0.00    Average packs/day: 1 pack/day for 5.0 years (5.0 ttl pk-yrs)    Types: Cigarettes    Start date: 04/17/2000    Quit date: 04/17/2005    Years since quitting: 18.1   Smokeless tobacco: Never  Substance Use Topics   Alcohol use: Not Currently    Alcohol/week: 14.0 standard drinks of alcohol    Types: 14 Cans of beer per week    Comment: quit ~2020    Allergies  Allergen Reactions   Etodolac Rash   Penicillin G Rash   Penicillins Rash    Has patient had a PCN reaction causing immediate rash, facial/tongue/throat swelling, SOB or lightheadedness with hypotension: Yes  Has patient had a PCN reaction causing severe rash involving mucus membranes or skin necrosis: No  Has patient had a PCN reaction that required hospitalization: No  Has patient had a PCN reaction occurring within the last 10 years: No  If all of the above answers are "NO", then may proceed with Cephalosporin use.  Has patient had a PCN reaction causing immediate rash, facial/tongue/throat swelling, SOB or lightheadedness with hypotension: Yes Has patient had a PCN reaction causing severe rash involving mucus membranes or skin necrosis: No Has patient had a PCN reaction that required hospitalization: No Has patient had a PCN reaction occurring within the last 10 years: No If all of the above answers are "NO", then may proceed with Cephalosporin use.    Current Meds  Medication Sig   ACCRUFER 30 MG CAPS Take 1 capsule (30  mg total) by mouth 2 (two) times daily.   albuterol (PROVENTIL HFA) 108 (90 Base) MCG/ACT inhaler Inhale 2 puffs into the lungs every 6 (six) hours as needed for wheezing or shortness of breath.   azelastine (ASTELIN) 0.1 % nasal spray Place 1 spray into both nostrils 2 (two) times daily. Use in each nostril as directed   Blood Glucose Monitoring Suppl (ONETOUCH VERIO) w/Device KIT Use to check glucose up to twice daily   cetirizine (ZYRTEC) 10 MG tablet Take 1 tablet (10 mg total) by mouth daily.   diclofenac (VOLTAREN) 75 MG EC tablet TAKE 1 TABLET(75 MG) BY MOUTH TWICE DAILY   DULoxetine (CYMBALTA) 60 MG capsule Take 1 capsule (60 mg total) by mouth 2 (two) times daily.   fexofenadine (ALLEGRA) 180 MG tablet Take 1 tablet (180 mg total) by mouth daily.   Finerenone (KERENDIA) 10 MG TABS TAKE 1 TABLET BY MOUTH EVERY DAY   gabapentin (NEURONTIN) 400 MG capsule TAKE 1 CAPSULE BY MOUTH TWICE DAILY AND 2 CAPSULES BY MOUTH AT BEDTIME   insulin glargine (LANTUS SOLOSTAR) 100 UNIT/ML Solostar Pen ADMINISTER 17 UNITS UNDER THE SKIN INTO SKIN EVERY  DAY AT BEDTIME   insulin lispro (HUMALOG) 100 UNIT/ML injection INJECT 2 UNITS UNDER THE SKIN THREE TIMES DAILY BEFORE MEALS SLIDING SCALE MAX 30 UNITS PER DAY DISCARD VIAL AFTER 28 DAYS ONCE PUNCTURED   Insulin Pen Needle 32G X 6 MM MISC Use as directed.   Magnesium Oxide 400 MG CAPS Take 1 capsule (400 mg total) by mouth daily in the afternoon.   Melatonin 10 MG TABS Take 1 tablet by mouth at bedtime.   metFORMIN (GLUCOPHAGE) 500 MG tablet TAKE 1 TABLET BY MOUTH TWICE DAILY   methotrexate (RHEUMATREX) 2.5 MG tablet Take 3 tablets (7.5 mg total) by mouth once a week. Caution:Chemotherapy. Protect from light.   metoprolol tartrate (LOPRESSOR) 25 MG tablet Take 2 tablets (50 mg total) by mouth 2 (two) times daily.   montelukast (SINGULAIR) 10 MG tablet Take 1 tablet (10 mg total) by mouth daily.   Omega-3 Fatty Acids (FISH OIL) 1000 MG CAPS Take 1,000 mg by  mouth 2 (two) times daily.   omeprazole (PRILOSEC) 20 MG capsule TAKE ONE CAPSULE BY MOUTH TWICE DAILY   OZEMPIC, 1 MG/DOSE, 4 MG/3ML SOPN INJECT 1 MG UNDER THE SKIN ONE DAY A WEEK   rosuvastatin (CRESTOR) 5 MG tablet TAKE ONE TABLET BY MOUTH ONCE DAILY   SYMBICORT 160-4.5 MCG/ACT inhaler Inhale 2 puffs into the lungs 2 (two) times daily. Rinse your mouth well after use.   XIIDRA 5 % SOLN Apply 1 drop to eye 2 (two) times daily.    Immunization History  Administered Date(s) Administered   Influenza,inj,Quad PF,6+ Mos 05/15/2019, 06/01/2022   Influenza-Unspecified 06/04/2015, 06/10/2016, 06/08/2017, 05/25/2018, 06/01/2020, 05/20/2021   PFIZER(Purple Top)SARS-COV-2 Vaccination 12/02/2019, 12/23/2019   Pneumococcal Polysaccharide-23 05/16/2019   Tdap 06/04/2015, 05/05/2023        Objective:     BP 110/64 (BP Location: Right Arm, Cuff Size: Normal)   Pulse 82   Temp 98.4 F (36.9 C)   Ht 5\' 2"  (1.575 m)   Wt 170 lb 12.8 oz (77.5 kg)   SpO2 95%   BMI 31.24 kg/m   SpO2: 95 % O2 Device: None (Room air)  GENERAL: Obese, well-developed woman, no acute distress.  Fully ambulatory, no conversational dyspnea. HEAD: Normocephalic, atraumatic. EYES: Pupils equal, round, reactive to light.  No scleral icterus. MOUTH: Poor dentition, oral mucosa moist.  No thrush. NECK: Supple. No thyromegaly. Trachea midline. No JVD.  No adenopathy. PULMONARY: Good air entry bilaterally.  No adventitious sounds. CARDIOVASCULAR: S1 and S2. Regular rate and rhythm.  No rubs, murmurs or gallops heard. ABDOMEN: Obese otherwise benign. MUSCULOSKELETAL: No joint deformity, no clubbing, no edema.  NEUROLOGIC: No overt focal deficit, no gait disturbance, speech is fluent. SKIN: Intact,warm,dry.  PSYCH: Mood and behavior normal.     Assessment & Plan:     ICD-10-CM   1. Sarcoidosis  D86.9 Angiotensin converting enzyme    CANCELED: Angiotensin converting enzyme   Continue methotrexate 7.5 mg/week Check  ACE level and RBC folate level today Pending ACE level determine need for repeat CT    2. Lymphadenopathy, generalized  R59.1    ACE level being checked Will repeat CT pending level    3. 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 Stage 3 chronic kidney disease, unspecified whether stage 3a or 3b CKD (HCC)  N18.30    Renal function is stable    5. Medication monitoring encounter  Z51.81 RBC Folate   Check RBC folate level    6. Drug-induced folate deficiency anemia - Rule out  D52.1 RBC Folate   Check RBC folate level    7. Need for influenza vaccination  Z23 Flu vaccine trivalent PF, 6mos and older(Flulaval,Afluria,Fluarix,Fluzone)   Patient received flu vaccine today      Orders Placed This Encounter  Procedures   Flu vaccine trivalent PF, 6mos and older(Flulaval,Afluria,Fluarix,Fluzone)   RBC Folate    Standing Status:   Future    Number of Occurrences:   1    Standing Expiration Date:   05/29/2024   Angiotensin converting enzyme    Standing Status:   Future    Number of Occurrences:   1    Standing Expiration Date:   05/29/2024   Pending ACE level will determine timing for repeat CT.  We may be able to discontinue methotrexate if she is showing significant response.  Will see the patient in follow-up in 3 months time.  We will notify her of the results of the blood work once these are available.  Gailen Shelter, MD [facility]  [facility]    *This note was dictated using voice recognition software/Dragon.  Despite best efforts to proofread, errors can occur which can change the meaning. Any transcriptional errors that result from this process are unintentional and may not be fully corrected at the time of dictation.

## 2023-05-31 LAB — FOLATE RBC
Folate, Hemolysate: 278 ng/mL
Folate, RBC: 745 ng/mL (ref >498)
Hematocrit: 37.3 % (ref 34.0–46.6)

## 2023-05-31 LAB — ANGIOTENSIN CONVERTING ENZYME: Angiotensin-Converting Enzyme: 77 U/L (ref 14–82)

## 2023-06-02 ENCOUNTER — Other Ambulatory Visit: Payer: Self-pay

## 2023-06-02 DIAGNOSIS — D869 Sarcoidosis, unspecified: Secondary | ICD-10-CM

## 2023-06-02 NOTE — Addendum Note (Signed)
Addended by: Lajoyce Lauber A on: 06/02/2023 09:39 AM   Modules accepted: Orders

## 2023-06-03 ENCOUNTER — Encounter: Payer: Self-pay | Admitting: Family

## 2023-06-03 NOTE — Progress Notes (Signed)
   CHIEF COMPLAINT  B12 Shot     REASON FOR VISIT  B12 Injection     ASSESSMENT  B12 Deficiency, Unspecified     PLAN  Diagnoses and all orders for this visit:  B12 deficiency -     cyanocobalamin (VITAMIN B12) injection 1,000 mcg     Pt. given B12 injection in clinic.  Return for next injection per provider instructions.   Total time spent: 5 minutes  Miki Kins, FNP  05/22/2023

## 2023-06-12 ENCOUNTER — Ambulatory Visit
Admission: RE | Admit: 2023-06-12 | Discharge: 2023-06-12 | Disposition: A | Discharge status: Home / Self Care | Payer: Medicaid Other | Source: Ambulatory Visit | Attending: Pulmonary Disease | Admitting: Pulmonary Disease

## 2023-06-12 DIAGNOSIS — D869 Sarcoidosis, unspecified: Secondary | ICD-10-CM | POA: Insufficient documentation

## 2023-06-12 LAB — POCT I-STAT CREATININE: Creatinine, Ser: 1 mg/dL (ref 0.44–1.00)

## 2023-06-12 MED ORDER — IOHEXOL 300 MG/ML  SOLN
100.0000 mL | Freq: Once | INTRAMUSCULAR | Status: AC | PRN
  Administered 2023-06-12: 100 mL via INTRAVENOUS

## 2023-06-19 ENCOUNTER — Ambulatory Visit (INDEPENDENT_AMBULATORY_CARE_PROVIDER_SITE_OTHER): Payer: Medicaid Other | Admitting: Family

## 2023-06-19 DIAGNOSIS — E538 Deficiency of other specified B group vitamins: Secondary | ICD-10-CM | POA: Diagnosis not present

## 2023-06-19 MED ORDER — CYANOCOBALAMIN 1000 MCG/ML IJ SOLN
1000.0000 ug | Freq: Once | INTRAMUSCULAR | Status: AC
Start: 2023-06-19 — End: 2023-06-19
  Administered 2023-06-19: 1000 ug via INTRAMUSCULAR

## 2023-06-19 NOTE — Progress Notes (Signed)
   CHIEF COMPLAINT  B12 Shot     REASON FOR VISIT  B12 Injection     ASSESSMENT  B12 Deficiency, Unspecified     PLAN  Diagnoses and all orders for this visit:  B12 deficiency -     cyanocobalamin (VITAMIN B12) injection 1,000 mcg     Pt. given B12 injection in clinic.  Return for next injection per provider instructions.   Total time spent: 5 minutes  Miki Kins, FNP  06/19/2023

## 2023-06-24 IMAGING — US US EXTREM LOW VENOUS
1 series · 14 of 24 positions shown · non-contrast
Comparison: None Available.

CLINICAL DATA: Bilateral lower extremity edema for 1 week.

EXAM:
BILATERAL LOWER EXTREMITY VENOUS DOPPLER ULTRASOUND
TECHNIQUE: Gray-scale sonography with compression, as well as color and duplex
ultrasound, were performed to evaluate the deep venous system(s)
from the level of the common femoral vein through the popliteal and
proximal calf veins.

[Series 1: us venous img lower bilat (dvt) · portal-venous · 14 of 56 slices shown]
[im 1/56]
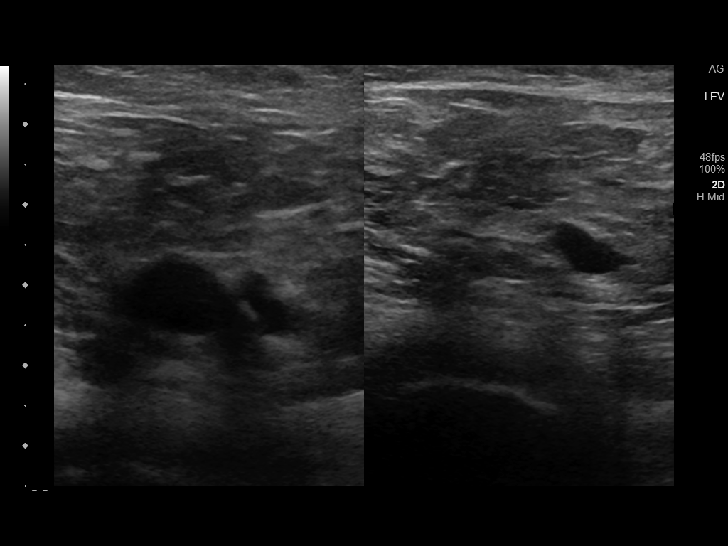
[im 5/56]
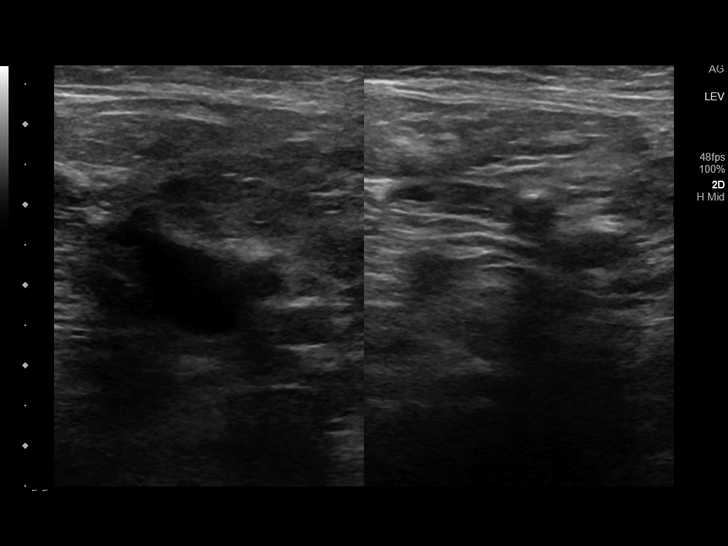
[im 10/56]
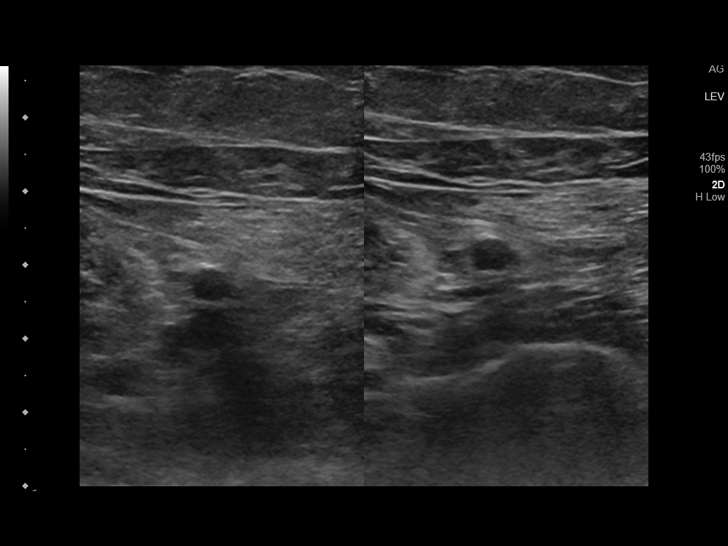
[im 15/56]
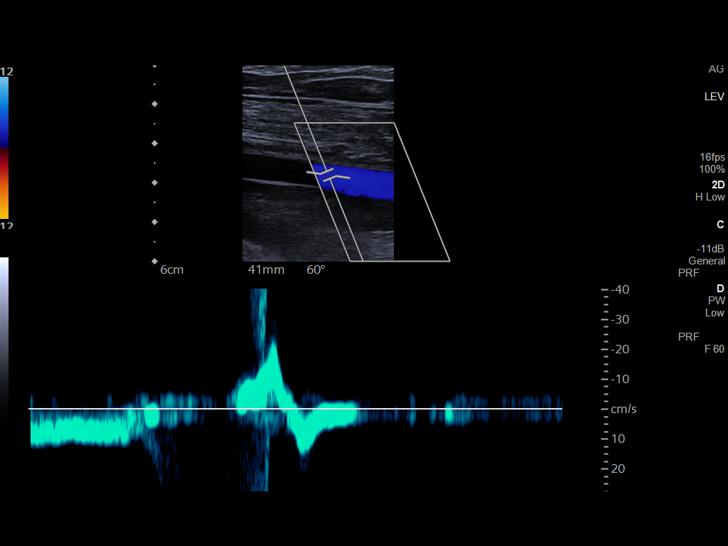
[im 17/56]
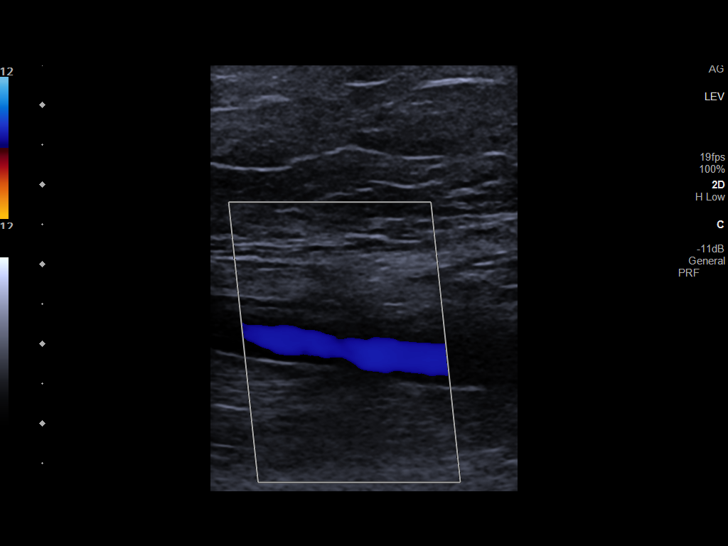
[im 22/56]
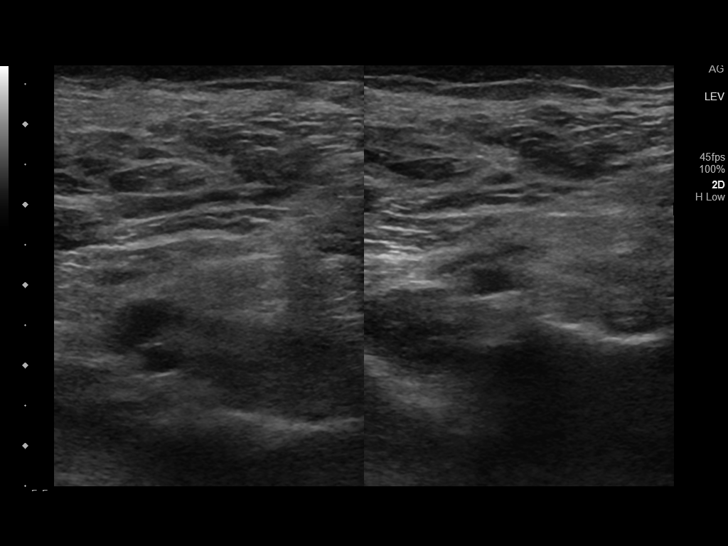
[im 27/56]
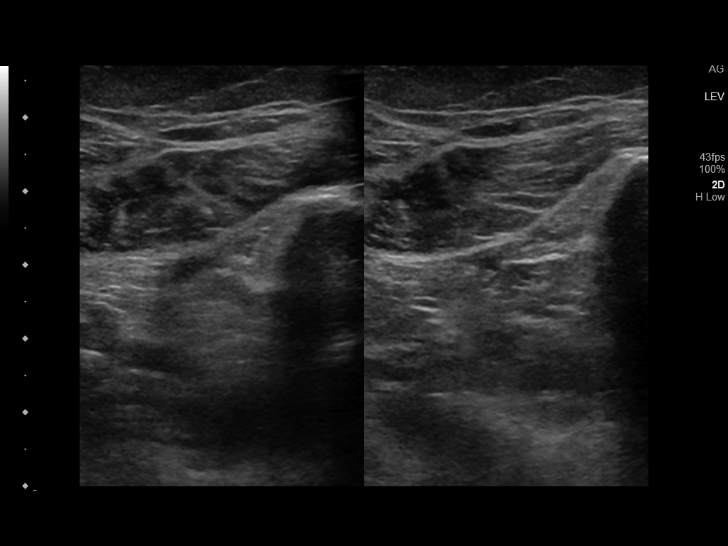
[im 29/56]
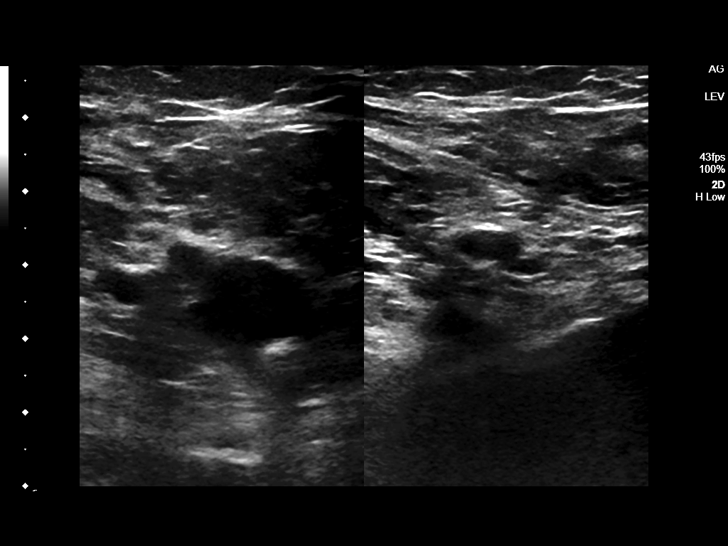
[im 34/56]
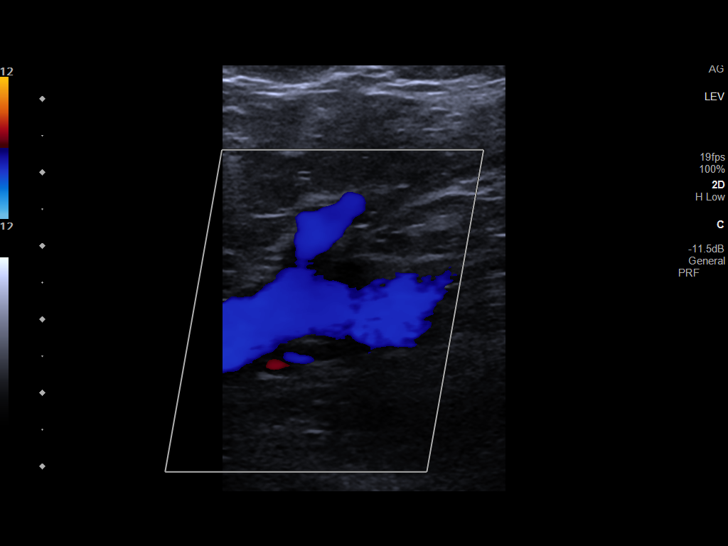
[im 39/56]
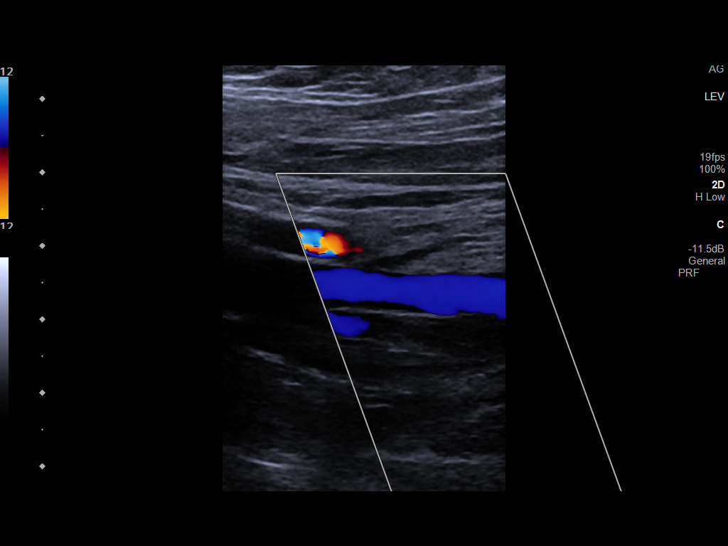
[im 44/56]
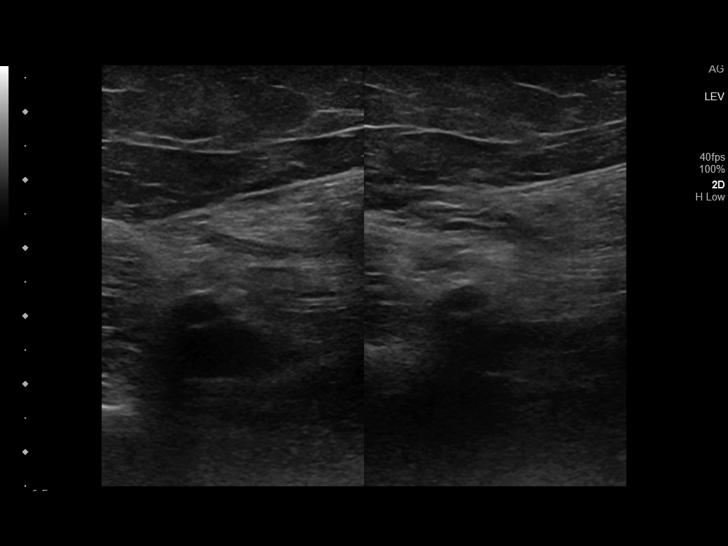
[im 46/56]
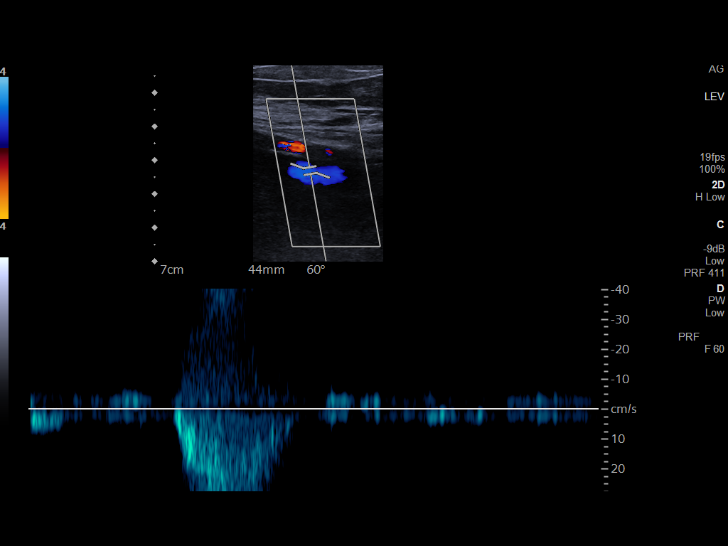
[im 51/56]
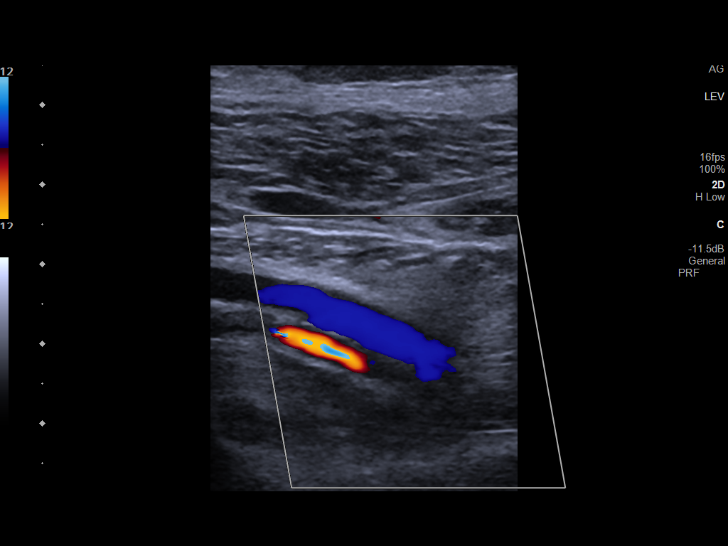
[im 56/56]
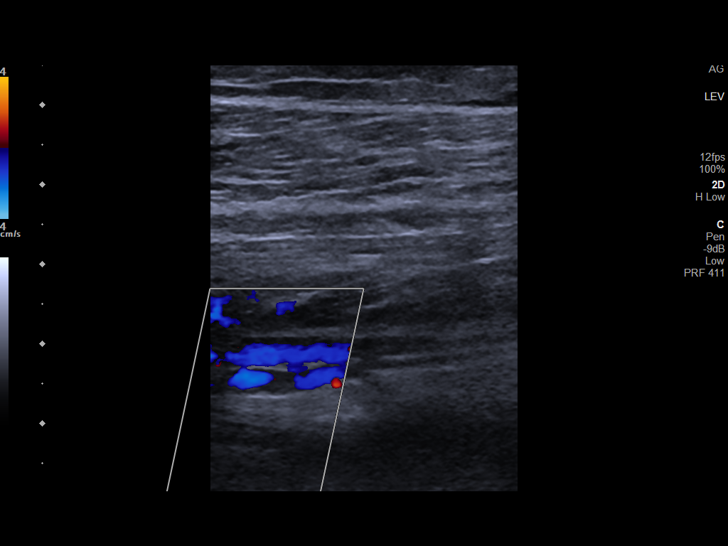

[14 of 24 positions shown; findings below may reference images not displayed]

FINDINGS: VENOUS

Normal compressibility of the common femoral, superficial femoral,
and popliteal veins, as well as the visualized calf veins.
Visualized portions of profunda femoral vein and great saphenous
vein unremarkable. No filling defects to suggest DVT on grayscale or
color Doppler imaging. Doppler waveforms show normal direction of
venous flow, normal respiratory plasticity and response to
augmentation.

Limited views of the contralateral common femoral vein are
unremarkable.

OTHER

None.

Limitations: none
IMPRESSION: Negative.

## 2023-06-24 IMAGING — CR DG CHEST 2V
2 series · 2 of 2 positions shown · non-contrast
Comparison: September 22, 2021

CLINICAL DATA: Leg swelling.  Suspect heart failure.

EXAM:
CHEST - 2 VIEW

[chest lat]
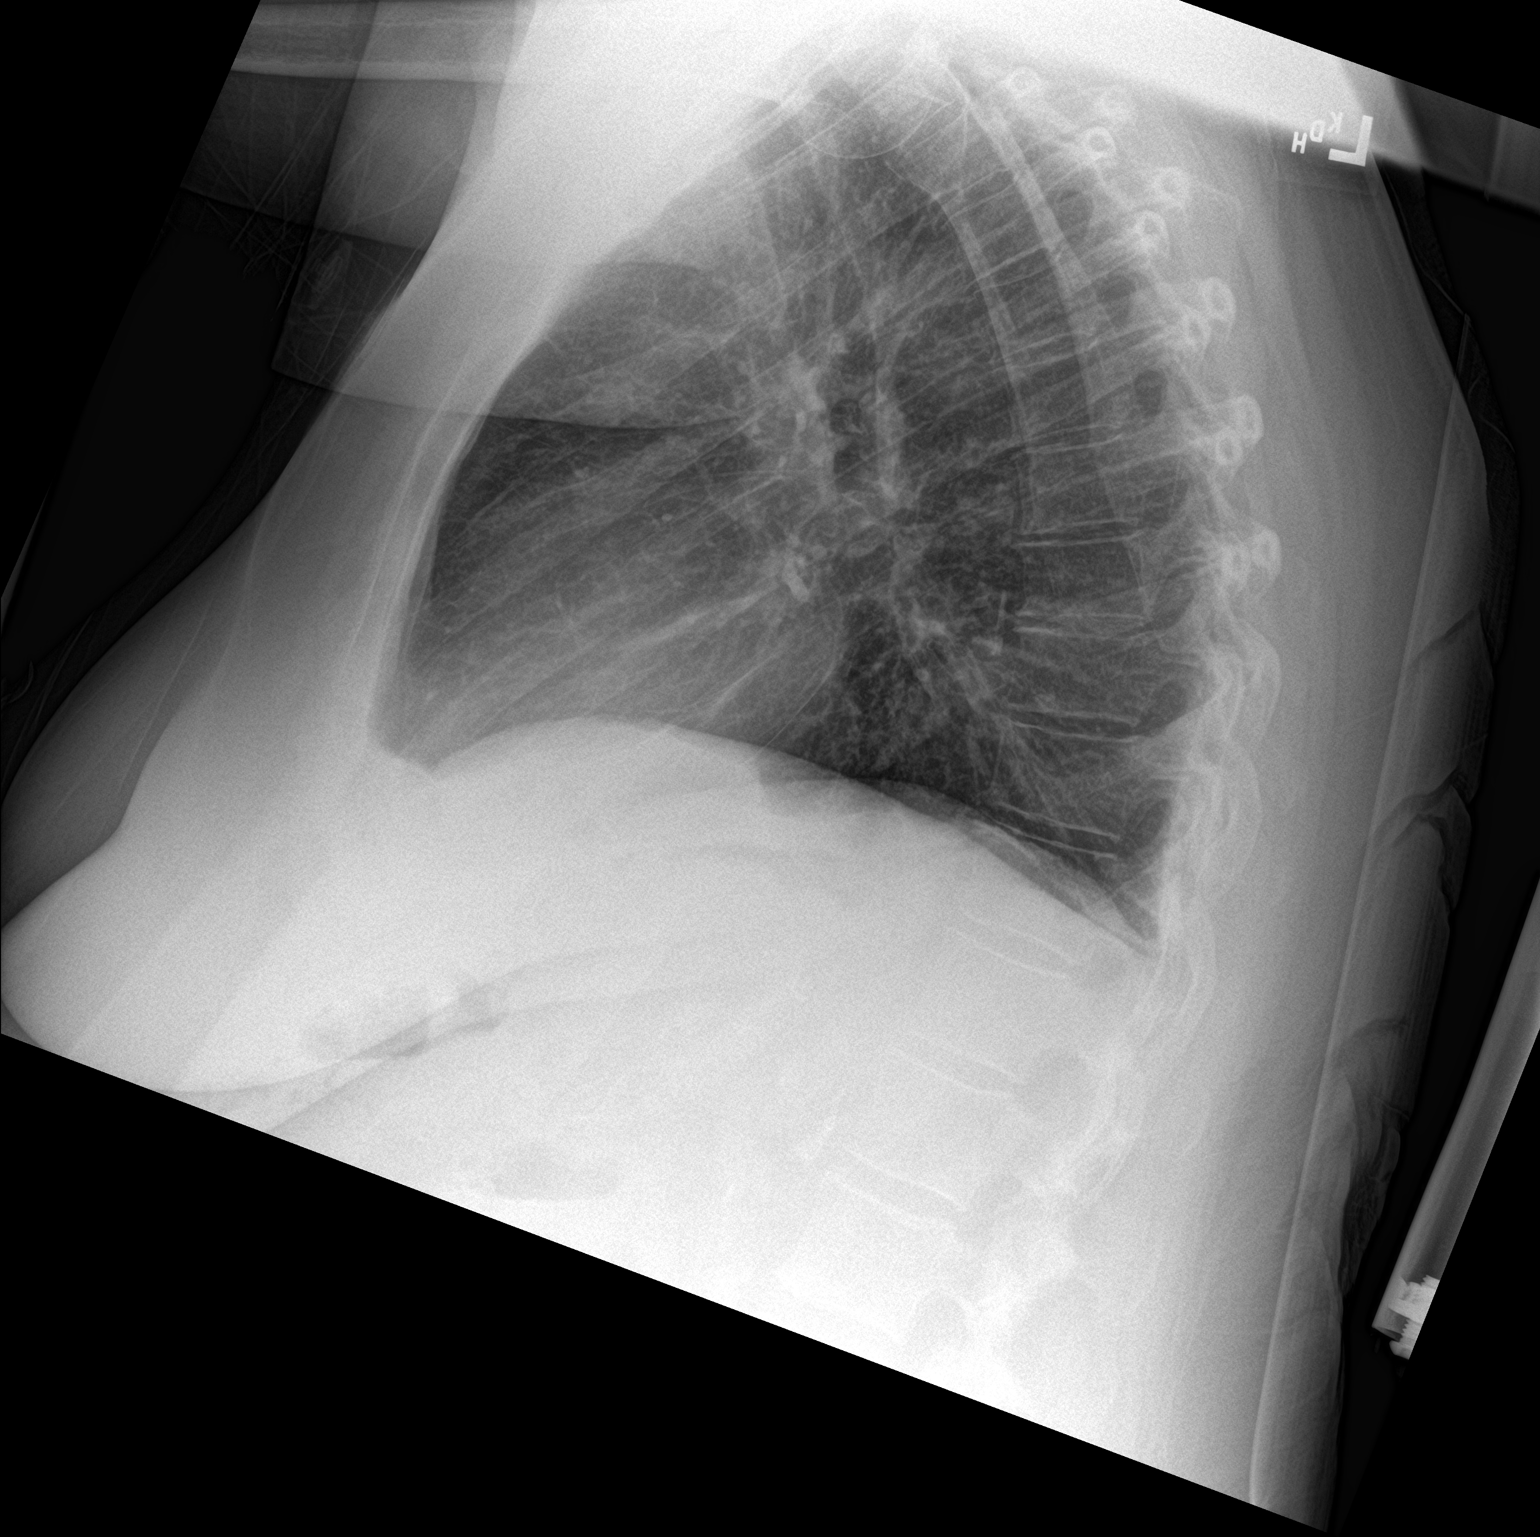

[chest ap]
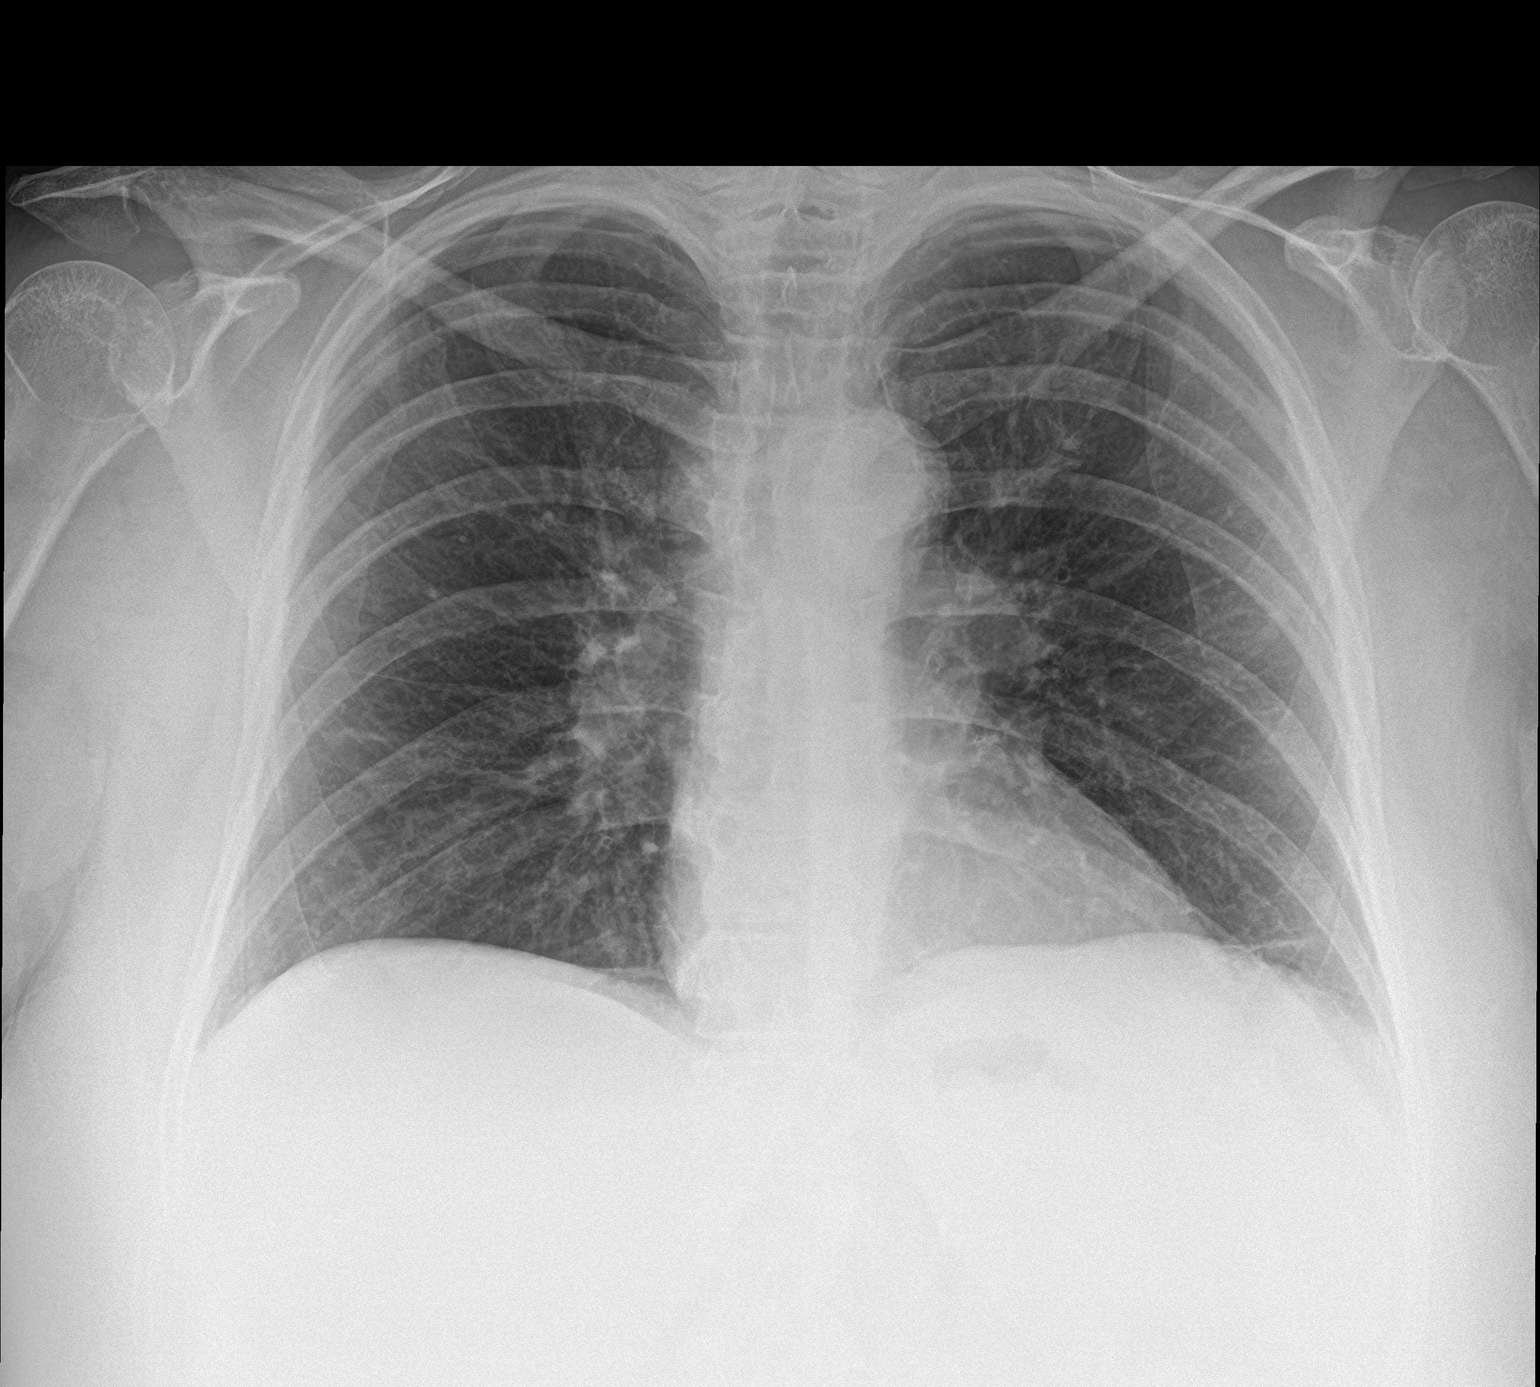

[2 of 2 positions shown; findings below may reference images not displayed]

FINDINGS: Cardiomediastinal silhouette is normal. Mediastinal contours appear
intact.

There is no evidence of focal airspace consolidation, pleural
effusion or pneumothorax.

Osseous structures are without acute abnormality. Soft tissues are
grossly normal.
IMPRESSION: No active cardiopulmonary disease.

## 2023-06-28 ENCOUNTER — Ambulatory Visit: Payer: Medicaid Other | Admitting: Family

## 2023-06-28 ENCOUNTER — Encounter: Payer: Self-pay | Admitting: Family

## 2023-06-28 VITALS — BP 108/68 | HR 76 | Ht 62.0 in | Wt 171.4 lb

## 2023-06-28 DIAGNOSIS — N390 Urinary tract infection, site not specified: Secondary | ICD-10-CM

## 2023-06-28 DIAGNOSIS — E1140 Type 2 diabetes mellitus with diabetic neuropathy, unspecified: Secondary | ICD-10-CM

## 2023-06-28 DIAGNOSIS — R350 Frequency of micturition: Secondary | ICD-10-CM

## 2023-06-28 DIAGNOSIS — E7800 Pure hypercholesterolemia, unspecified: Secondary | ICD-10-CM

## 2023-06-28 DIAGNOSIS — R3589 Other polyuria: Secondary | ICD-10-CM

## 2023-06-28 DIAGNOSIS — Z794 Long term (current) use of insulin: Secondary | ICD-10-CM

## 2023-06-28 DIAGNOSIS — R300 Dysuria: Secondary | ICD-10-CM

## 2023-06-28 LAB — POCT URINALYSIS DIPSTICK
Bilirubin, UA: NEGATIVE
Blood, UA: NEGATIVE
Glucose, UA: POSITIVE — AB
Ketones, UA: NEGATIVE
Leukocytes, UA: NEGATIVE
Nitrite, UA: POSITIVE
Protein, UA: NEGATIVE
Spec Grav, UA: 1.015 (ref 1.010–1.025)
Urobilinogen, UA: 0.2 U/dL
pH, UA: 6 (ref 5.0–8.0)

## 2023-06-28 MED ORDER — GABAPENTIN 400 MG PO CAPS: ORAL_CAPSULE | ORAL | 1 refills | Status: DC

## 2023-06-28 MED ORDER — SYMBICORT 160-4.5 MCG/ACT IN AERO: 2.0000 | INHALATION_SPRAY | Freq: Two times a day (BID) | RESPIRATORY_TRACT | 12 refills | Status: DC

## 2023-06-28 MED ORDER — ROSUVASTATIN CALCIUM 5 MG PO TABS
ORAL_TABLET | ORAL | 2 refills | Status: DC
Start: 2023-06-28 — End: 2023-11-14

## 2023-06-28 MED ORDER — ACCRUFER 30 MG PO CAPS: 1.0000 | ORAL_CAPSULE | Freq: Two times a day (BID) | ORAL | 4 refills | Status: DC

## 2023-06-28 MED ORDER — DULOXETINE HCL 60 MG PO CPEP: 60.0000 mg | ORAL_CAPSULE | Freq: Two times a day (BID) | ORAL | 3 refills | Status: DC

## 2023-06-28 MED ORDER — CETIRIZINE HCL 10 MG PO TABS: 10.0000 mg | ORAL_TABLET | Freq: Every day | ORAL | 3 refills | Status: DC

## 2023-06-28 MED ORDER — KERENDIA 10 MG PO TABS: 1.0000 | ORAL_TABLET | Freq: Every day | ORAL | 3 refills | Status: DC

## 2023-06-28 MED ORDER — CIPROFLOXACIN HCL 500 MG PO TABS: 500.0000 mg | ORAL_TABLET | Freq: Every day | ORAL | 0 refills | Status: AC

## 2023-06-28 MED ORDER — MONTELUKAST SODIUM 10 MG PO TABS: 10.0000 mg | ORAL_TABLET | Freq: Every day | ORAL | 3 refills | Status: DC

## 2023-06-28 MED ORDER — METFORMIN HCL 500 MG PO TABS
500.0000 mg | ORAL_TABLET | Freq: Two times a day (BID) | ORAL | 0 refills | Status: DC
Start: 2023-06-28 — End: 2023-10-02

## 2023-06-28 NOTE — Progress Notes (Signed)
Acute Office Visit  Subjective:     Patient ID: Renee Bush, female    DOB: [birth year], 62 y.o.   MRN: [id number]  Patient is in today for  Chief Complaint  Patient presents with   Urinary Frequency    Urinary Frequency  This is a new problem. The current episode started 1 to 4 weeks ago. The problem occurs every urination. The problem has been waxing and waning. The quality of the pain is described as burning. There has been no fever. Associated symptoms include frequency and urgency. She has tried nothing for the symptoms. The treatment provided no relief.     Review of Systems  Genitourinary:  Positive for dysuria, frequency and urgency.  All other systems reviewed and are negative.       Objective:    BP 108/68   Pulse 76   Ht 5\' 2"  (1.575 m)   Wt 171 lb 6.4 oz (77.7 kg)   SpO2 100%   BMI 31.35 kg/m   Physical Exam Vitals and nursing note reviewed.  Constitutional:      Appearance: Normal appearance. She is normal weight.  HENT:     Head: Normocephalic.  Eyes:     Pupils: Pupils are equal, round, and reactive to light.  Cardiovascular:     Rate and Rhythm: Normal rate.  Pulmonary:     Effort: Pulmonary effort is normal.  Neurological:     General: No focal deficit present.     Mental Status: She is alert and oriented to person, place, and time. Mental status is at baseline.  Psychiatric:        Mood and Affect: Mood normal.        Behavior: Behavior normal.        Thought Content: Thought content normal.        Judgment: Judgment normal.     Results for orders placed or performed in visit on 06/28/23  Urine Culture   Specimen: Urine, Clean Catch   UC  Result Value Ref Range   Urine Culture, Routine Final report (A)    Organism ID, Bacteria Escherichia coli (A)    Antimicrobial Susceptibility Comment   POCT Urinalysis Dipstick (84166)  Result Value Ref Range   Color, UA yellow    Clarity, UA cloudy    Glucose, UA Positive (A) Negative    Bilirubin, UA neg    Ketones, UA neg    Spec Grav, UA 1.015 1.010 - 1.025   Blood, UA neg    pH, UA 6.0 5.0 - 8.0   Protein, UA Negative Negative   Urobilinogen, UA 0.2 0.2 or 1.0 E.U./dL   Nitrite, UA pos    Leukocytes, UA Negative Negative   Appearance cloudy    Odor yes     Recent Results (from the past 2160 hour(s))  RBC Folate     Status: None   Collection Time: 05/30/23 11:02 AM  Result Value Ref Range   Folate, Hemolysate 278.0 Not Estab. ng/mL   Hematocrit 37.3 34.0 - 46.6 %   Folate, RBC 745 >498 ng/mL    Comment: (NOTE) Performed At: [facility] [place] [address] [place], [place] [id number] Jolene Schimke MD [id number]   Angiotensin converting enzyme     Status: None   Collection Time: 05/30/23 11:02 AM  Result Value Ref Range   Angiotensin-Converting Enzyme 77 14 - 82 U/L    Comment: (NOTE) Performed At: [facility] [address] [place], [place] [id number] Jolene Schimke  MD [id number]   I-STAT creatinine     Status: None   Collection Time: 06/12/23 11:17 AM  Result Value Ref Range   Creatinine, Ser 1.00 0.44 - 1.00 mg/dL  POCT Urinalysis Dipstick (81191)     Status: Abnormal   Collection Time: 06/28/23 12:59 PM  Result Value Ref Range   Color, UA yellow    Clarity, UA cloudy    Glucose, UA Positive (A) Negative   Bilirubin, UA neg    Ketones, UA neg    Spec Grav, UA 1.015 1.010 - 1.025   Blood, UA neg    pH, UA 6.0 5.0 - 8.0   Protein, UA Negative Negative   Urobilinogen, UA 0.2 0.2 or 1.0 E.U./dL   Nitrite, UA pos    Leukocytes, UA Negative Negative   Appearance cloudy    Odor yes   Urine Culture     Status: Abnormal   Collection Time: 06/28/23  1:37 PM   Specimen: Urine, Clean Catch   UC  Result Value Ref Range   Urine Culture, Routine Final report (A)    Organism ID, Bacteria Escherichia coli (A)     Comment: Cefazolin <=4 ug/mL Cefazolin with an MIC <=16 predicts susceptibility to the oral agents cefaclor,  cefdinir, cefpodoxime, cefprozil, cefuroxime, cephalexin, and loracarbef when used for therapy of uncomplicated urinary tract infections due to E. coli, Klebsiella pneumoniae, and Proteus mirabilis. Greater than 100,000 colony forming units per mL    Antimicrobial Susceptibility Comment     Comment:       ** S = Susceptible; I = Intermediate; R = Resistant **                    P = Positive; N = Negative             MICS are expressed in micrograms per mL    Antibiotic                 RSLT#1    RSLT#2    RSLT#3    RSLT#4 Amoxicillin/Clavulanic Acid    S Ampicillin                     S Cefepime                       S Ceftriaxone                    S Cefuroxime                     S Ciprofloxacin                  S Ertapenem                      S Gentamicin                     S Imipenem                       S Levofloxacin                   S Meropenem                      S Nitrofurantoin                 S Piperacillin/Tazobactam  S Tetracycline                   S Tobramycin                     S Trimethoprim/Sulfa             S        Assessment & Plan:   Problem List Items Addressed This Visit       Active Problems   Type 2 diabetes mellitus with diabetic neuropathy, with long-term current use of insulin (HCC)   Relevant Medications   metFORMIN (GLUCOPHAGE) 500 MG tablet   rosuvastatin (CRESTOR) 5 MG tablet   Other Visit Diagnoses     Urinary tract infection without hematuria, site unspecified    -  Primary   sending UA/UC today.  Given abnormal in office, will treat and will call if need to change course.  Pt aware and understands POC.   Urinary frequency       Relevant Orders   POCT Urinalysis Dipstick (81002) (Completed)   Urinalysis, Routine w reflex microscopic   Urine Culture (Completed)   Hypercholesteremia       Relevant Medications   rosuvastatin (CRESTOR) 5 MG tablet   Dysuria       Frequency of urination and polyuria             Return as scheduled unless not improving..  Total time spent: 20 minutes  Miki Kins, FNP  06/28/2023   This document may have been prepared by [facility] Voice Recognition software and as such may include unintentional dictation errors.

## 2023-06-28 NOTE — Patient Instructions (Signed)
Urinary Tract Infection, Adult  A urinary tract infection (UTI) is an infection of any part of the urinary tract. The urinary tract includes the kidneys, ureters, bladder, and urethra. These organs make, store, and get rid of urine in the body. An upper UTI affects the ureters and kidneys. A lower UTI affects the bladder and urethra. What are the causes? Most urinary tract infections are caused by bacteria in your genital area around your urethra, where urine leaves your body. These bacteria grow and cause inflammation of your urinary tract. What increases the risk? You are more likely to develop this condition if: You have a urinary catheter that stays in place. You are not able to control when you urinate or have a bowel movement (incontinence). You are female and you: Use a spermicide or diaphragm for birth control. Have low estrogen levels. Are pregnant. You have certain genes that increase your risk. You are sexually active. You take antibiotic medicines. You have a condition that causes your flow of urine to slow down, such as: An enlarged prostate, if you are female. Blockage in your urethra. A kidney stone. A nerve condition that affects your bladder control (neurogenic bladder). Not getting enough to drink, or not urinating often. You have certain medical conditions, such as: Diabetes. A weak disease-fighting system (immunesystem). Sickle cell disease. Gout. Spinal cord injury. What are the signs or symptoms? Symptoms of this condition include: Needing to urinate right away (urgency). Frequent urination. This may include small amounts of urine each time you urinate. Pain or burning with urination. Blood in the urine. Urine that smells bad or unusual. Trouble urinating. Cloudy urine. Vaginal discharge, if you are female. Pain in the abdomen or the lower back. You may also have: Vomiting or a decreased appetite. Confusion. Irritability or tiredness. A fever or  chills. Diarrhea. The first symptom in older adults may be confusion. In some cases, they may not have any symptoms until the infection has worsened. How is this diagnosed? This condition is diagnosed based on your medical history and a physical exam. You may also have other tests, including: Urine tests. Blood tests. Tests for STIs (sexually transmitted infections). If you have had more than one UTI, a cystoscopy or imaging studies may be done to determine the cause of the infections. How is this treated? Treatment for this condition includes: Antibiotic medicine. Over-the-counter medicines to treat discomfort. Drinking enough water to stay hydrated. If you have frequent infections or have other conditions such as a kidney stone, you may need to see a health care provider who specializes in the urinary tract (urologist). In rare cases, urinary tract infections can cause sepsis. Sepsis is a life-threatening condition that occurs when the body responds to an infection. Sepsis is treated in the hospital with IV antibiotics, fluids, and other medicines. Follow these instructions at home:  Medicines Take over-the-counter and prescription medicines only as told by your health care provider. If you were prescribed an antibiotic medicine, take it as told by your health care provider. Do not stop using the antibiotic even if you start to feel better. General instructions Make sure you: Empty your bladder often and completely. Do not hold urine for long periods of time. Empty your bladder after sex. Wipe from front to back after urinating or having a bowel movement if you are female. Use each tissue only one time when you wipe. Drink enough fluid to keep your urine pale yellow. Keep all follow-up visits. This is important. Contact a health   care provider if: Your symptoms do not get better after 1-2 days. Your symptoms go away and then return. Get help right away if: You have severe pain in  your back or your lower abdomen. You have a fever or chills. You have nausea or vomiting. Summary A urinary tract infection (UTI) is an infection of any part of the urinary tract, which includes the kidneys, ureters, bladder, and urethra. Most urinary tract infections are caused by bacteria in your genital area. Treatment for this condition often includes antibiotic medicines. If you were prescribed an antibiotic medicine, take it as told by your health care provider. Do not stop using the antibiotic even if you start to feel better. Keep all follow-up visits. This is important. This information is not intended to replace advice given to you by your health care provider. Make sure you discuss any questions you have with your health care provider. Document Revised: 03/22/2020 Document Reviewed: 03/27/2020 Elsevier Patient Education  2024 Elsevier Inc.  

## 2023-07-02 LAB — URINE CULTURE

## 2023-07-12 ENCOUNTER — Ambulatory Visit: Payer: Medicaid Other | Admitting: Family

## 2023-07-19 ENCOUNTER — Ambulatory Visit (INDEPENDENT_AMBULATORY_CARE_PROVIDER_SITE_OTHER): Payer: Medicaid Other | Admitting: Family

## 2023-07-19 DIAGNOSIS — E538 Deficiency of other specified B group vitamins: Secondary | ICD-10-CM | POA: Diagnosis not present

## 2023-07-19 MED ORDER — CYANOCOBALAMIN 1000 MCG/ML IJ SOLN
1000.0000 ug | Freq: Once | INTRAMUSCULAR | Status: AC
Start: 2023-07-19 — End: 2023-07-19
  Administered 2023-07-19: 1000 ug via INTRAMUSCULAR

## 2023-07-20 ENCOUNTER — Ambulatory Visit: Payer: Medicaid Other

## 2023-07-22 ENCOUNTER — Encounter: Payer: Self-pay | Admitting: Family

## 2023-07-22 NOTE — Progress Notes (Signed)
   CHIEF COMPLAINT  B12 Shot     REASON FOR VISIT  B12 Injection     ASSESSMENT  B12 Deficiency, Unspecified     PLAN  Diagnoses and all orders for this visit:  B12 deficiency -     cyanocobalamin (VITAMIN B12) injection 1,000 mcg     Pt. given B12 injection in clinic.  Return for next injection per provider instructions.   Total time spent: 5 minutes  Miki Kins, FNP  07/19/2023

## 2023-07-24 ENCOUNTER — Other Ambulatory Visit: Payer: Self-pay | Admitting: Family

## 2023-07-30 ENCOUNTER — Encounter: Payer: Self-pay | Admitting: Family

## 2023-08-03 ENCOUNTER — Other Ambulatory Visit: Payer: Self-pay | Admitting: Family

## 2023-08-16 ENCOUNTER — Ambulatory Visit: Payer: Medicaid Other | Admitting: Family

## 2023-08-16 ENCOUNTER — Encounter: Payer: Self-pay | Admitting: Family

## 2023-08-16 VITALS — BP 106/70 | HR 81 | Ht 62.0 in | Wt 170.8 lb

## 2023-08-16 DIAGNOSIS — E1169 Type 2 diabetes mellitus with other specified complication: Secondary | ICD-10-CM

## 2023-08-16 DIAGNOSIS — E1140 Type 2 diabetes mellitus with diabetic neuropathy, unspecified: Secondary | ICD-10-CM

## 2023-08-16 DIAGNOSIS — D509 Iron deficiency anemia, unspecified: Secondary | ICD-10-CM

## 2023-08-16 DIAGNOSIS — Z794 Long term (current) use of insulin: Secondary | ICD-10-CM

## 2023-08-16 DIAGNOSIS — E559 Vitamin D deficiency, unspecified: Secondary | ICD-10-CM

## 2023-08-16 DIAGNOSIS — E538 Deficiency of other specified B group vitamins: Secondary | ICD-10-CM | POA: Diagnosis not present

## 2023-08-16 DIAGNOSIS — I10 Essential (primary) hypertension: Secondary | ICD-10-CM | POA: Diagnosis not present

## 2023-08-16 DIAGNOSIS — E785 Hyperlipidemia, unspecified: Secondary | ICD-10-CM

## 2023-08-16 MED ORDER — OZEMPIC (1 MG/DOSE) 4 MG/3ML ~~LOC~~ SOPN: 1.0000 mg | PEN_INJECTOR | SUBCUTANEOUS | 3 refills | Status: DC

## 2023-08-16 MED ORDER — AZITHROMYCIN 250 MG PO TABS: ORAL_TABLET | ORAL | 0 refills | Status: AC

## 2023-08-16 MED ORDER — CYANOCOBALAMIN 1000 MCG/ML IJ SOLN
1000.0000 ug | Freq: Once | INTRAMUSCULAR | Status: AC
  Administered 2023-08-16: 1000 ug via INTRAMUSCULAR

## 2023-08-16 MED ORDER — LANTUS SOLOSTAR 100 UNIT/ML ~~LOC~~ SOPN: 17.0000 [IU] | PEN_INJECTOR | Freq: Every day | SUBCUTANEOUS | 3 refills | Status: DC

## 2023-08-16 MED ORDER — SYMBICORT 160-4.5 MCG/ACT IN AERO: 2.0000 | INHALATION_SPRAY | Freq: Two times a day (BID) | RESPIRATORY_TRACT | 12 refills | Status: DC

## 2023-08-16 MED ORDER — DICLOFENAC SODIUM 75 MG PO TBEC: 75.0000 mg | DELAYED_RELEASE_TABLET | Freq: Two times a day (BID) | ORAL | 1 refills | Status: DC

## 2023-08-16 MED ORDER — INSULIN PEN NEEDLE 32G X 6 MM MISC: 99 refills | Status: AC

## 2023-08-16 NOTE — Progress Notes (Signed)
 Established Patient Office Visit  Subjective:  Patient ID: Renee Bush, female    DOB: September 11, 1960  Age: 62 y.o. MRN: [id number]  Chief Complaint  Patient presents with   Follow-up    2 month follow up    Patient is here today for her 2 months follow up.  She has been feeling fairly well since last appointment.   She does have additional concerns to discuss today.  She thinks she may have an upper respiratory infection.  She has been having some nasal congestion,  Labs are due today. She needs refills.   I have reviewed her active problem list, medication list, allergies, family history, health maintenance, notes from last encounter, lab results for her appointment today.      No other concerns at this time.   Past Medical History:  Diagnosis Date   AKI (acute kidney injury) (HCC) 07/07/2022   Antibiotic-induced yeast infection 01/09/2020   Balance problem 12/18/2019   Blood in stool 12/28/2021   Chest pain 10/14/2021   Chronic kidney disease, stage III (moderate) (HCC) 10/06/2017   Constipation 07/13/2021   Diabetes mellitus without complication (HCC)    DKA, type 2 (HCC) 04/16/2022   ETOH abuse 04/18/2015   Family history of colonic polyps    GERD (gastroesophageal reflux disease)    History of allergy 06/11/2019   Hospital discharge follow-up 01/06/2022   Hypercholesteremia    Hypertension 09/10/2015   Hypokalemia 04/17/2022   Palpitations 10/28/2021   Persistent dry cough 11/16/2021   Sarcoidosis 10/12/2021   self-reported   Shortness of breath 10/28/2021   Urinary frequency 07/02/2020   Urinary tract infection 11/16/2021    Past Surgical History:  Procedure Laterality Date   APPENDECTOMY     CESAREAN SECTION  1991   COLONOSCOPY WITH PROPOFOL N/A 06/03/2019   Procedure: COLONOSCOPY WITH PROPOFOL;  Surgeon: Wyline Mood, MD;  Location: [facility] ENDOSCOPY;  Service: Gastroenterology;  Laterality: N/A;    Social History   Socioeconomic History    Marital status: Married    Spouse name: Channing Mutters   Number of children: 4   Years of education: Not on file   Highest education level: 11th grade  Occupational History   Occupation: na  Tobacco Use   Smoking status: Former    Current packs/day: 0.00    Average packs/day: 1 pack/day for 5.0 years (5.0 ttl pk-yrs)    Types: Cigarettes    Start date: 04/17/2000    Quit date: 04/17/2005    Years since quitting: 18.5   Smokeless tobacco: Never  Vaping Use   Vaping status: Never Used  Substance and Sexual Activity   Alcohol use: Not Currently    Alcohol/week: 14.0 standard drinks of alcohol    Types: 14 Cans of beer per week    Comment: quit ~2020   Drug use: No   Sexual activity: Not Currently  Other Topics Concern   Not on file  Social History Narrative   Lives in mobile home paid for   Social Drivers of Health   Financial Resource Strain: Low Risk  (05/19/2020)   Overall Financial Resource Strain (CARDIA)    Difficulty of Paying Living Expenses: Not hard at all  Food Insecurity: No Food Insecurity (05/11/2021)   Hunger Vital Sign    Worried About Running Out of Food in the Last Year: Never true    Ran Out of Food in the Last Year: Never true  Transportation Needs: No Transportation Needs (05/11/2021)   PRAPARE - Transportation  Lack of Transportation (Medical): No    Lack of Transportation (Non-Medical): No  Physical Activity: Insufficiently Active (05/19/2020)   Exercise Vital Sign    Days of Exercise per Week: 7 days    Minutes of Exercise per Session: 10 min  Stress: Stress Concern Present (05/28/2020)   [employer] of Occupational Health - Occupational Stress Questionnaire    Feeling of Stress : To some extent  Social Connections: Moderately Isolated (05/28/2020)   Social Connection and Isolation Panel [NHANES]    Frequency of Communication with Friends and Family: More than three times a week    Frequency of Social Gatherings with Friends and Family: More than three  times a week    Attends Religious Services: Never    Database administrator or Organizations: No    Attends Banker Meetings: Never    Marital Status: Married  Catering manager Violence: Not At Risk (05/19/2020)   Humiliation, Afraid, Rape, and Kick questionnaire    Fear of Current or Ex-Partner: No    Emotionally Abused: No    Physically Abused: No    Sexually Abused: No    Family History  Problem Relation Age of Onset   COPD Mother    Alzheimer's disease Mother    Hypertension Father    Hyperlipidemia Father    Heart attack Father    Diabetes type II Father    Lupus Father    Diabetes type II Sister    Diabetes type II Sister    Diabetes type II Sister    Diabetes type II Sister    Gestational diabetes Daughter     Allergies  Allergen Reactions   Etodolac Rash   Penicillin G Rash   Penicillins Rash    Has patient had a PCN reaction causing immediate rash, facial/tongue/throat swelling, SOB or lightheadedness with hypotension: Yes  Has patient had a PCN reaction causing severe rash involving mucus membranes or skin necrosis: No  Has patient had a PCN reaction that required hospitalization: No  Has patient had a PCN reaction occurring within the last 10 years: No  If all of the above answers are "NO", then may proceed with Cephalosporin use.  Has patient had a PCN reaction causing immediate rash, facial/tongue/throat swelling, SOB or lightheadedness with hypotension: Yes Has patient had a PCN reaction causing severe rash involving mucus membranes or skin necrosis: No Has patient had a PCN reaction that required hospitalization: No Has patient had a PCN reaction occurring within the last 10 years: No If all of the above answers are "NO", then may proceed with Cephalosporin use.    Review of Systems  HENT:  Positive for sinus pain.   Respiratory:  Positive for cough.   All other systems reviewed and are negative.      Objective:   BP 106/70   Pulse  81   Ht 5\' 2"  (1.575 m)   Wt 170 lb 12.8 oz (77.5 kg)   SpO2 97%   BMI 31.24 kg/m   Vitals:   08/16/23 1055  BP: 106/70  Pulse: 81  Height: 5\' 2"  (1.575 m)  Weight: 170 lb 12.8 oz (77.5 kg)  SpO2: 97%  BMI (Calculated): 31.23    Physical Exam Vitals and nursing note reviewed.  Constitutional:      Appearance: Normal appearance. She is normal weight.  HENT:     Head: Normocephalic.  Eyes:     Extraocular Movements: Extraocular movements intact.     Conjunctiva/sclera: Conjunctivae normal.  Pupils: Pupils are equal, round, and reactive to light.  Cardiovascular:     Rate and Rhythm: Normal rate.  Pulmonary:     Effort: Pulmonary effort is normal.  Neurological:     General: No focal deficit present.     Mental Status: She is alert and oriented to person, place, and time. Mental status is at baseline.  Psychiatric:        Mood and Affect: Mood normal.        Behavior: Behavior normal.        Thought Content: Thought content normal.        Judgment: Judgment normal.      Results for orders placed or performed in visit on 08/16/23  Lipid panel  Result Value Ref Range   Cholesterol, Total 138 100 - 199 mg/dL   Triglycerides 161 0 - 149 mg/dL   HDL 38 (L) >09 mg/dL   VLDL Cholesterol Cal 20 5 - 40 mg/dL   LDL Chol Calc (NIH) 80 0 - 99 mg/dL   Chol/HDL Ratio 3.6 0.0 - 4.4 ratio  VITAMIN D 25 Hydroxy (Vit-D Deficiency, Fractures)  Result Value Ref Range   Vit D, 25-Hydroxy 63.8 30.0 - 100.0 ng/mL  CMP14+EGFR  Result Value Ref Range   Glucose 257 (H) 70 - 99 mg/dL   BUN 22 8 - 27 mg/dL   Creatinine, Ser 6.04 (H) 0.57 - 1.00 mg/dL   eGFR 58 (L) >54 UJ/WJX/9.14   BUN/Creatinine Ratio 20 12 - 28   Sodium 136 134 - 144 mmol/L   Potassium 5.3 (H) 3.5 - 5.2 mmol/L   Chloride 97 96 - 106 mmol/L   CO2 26 20 - 29 mmol/L   Calcium 9.6 8.7 - 10.3 mg/dL   Total Protein 6.2 6.0 - 8.5 g/dL   Albumin 3.8 (L) 3.9 - 4.9 g/dL   Globulin, Total 2.4 1.5 - 4.5 g/dL    Bilirubin Total 0.3 0.0 - 1.2 mg/dL   Alkaline Phosphatase 77 44 - 121 IU/L   AST 29 0 - 40 IU/L   ALT 23 0 - 32 IU/L  TSH  Result Value Ref Range   TSH 2.010 0.450 - 4.500 uIU/mL  Hemoglobin A1c  Result Value Ref Range   Hgb A1c MFr Bld 11.3 (H) 4.8 - 5.6 %   Est. average glucose Bld gHb Est-mCnc 278 mg/dL  Vitamin N82  Result Value Ref Range   Vitamin B-12 >2000 (H) 232 - 1245 pg/mL  CBC with Diff  Result Value Ref Range   WBC 5.9 3.4 - 10.8 x10E3/uL   RBC 4.63 3.77 - 5.28 x10E6/uL   Hemoglobin 13.2 11.1 - 15.9 g/dL   Hematocrit 95.6 21.3 - 46.6 %   MCV 88 79 - 97 fL   MCH 28.5 26.6 - 33.0 pg   MCHC 32.6 31.5 - 35.7 g/dL   RDW 08.6 57.8 - 46.9 %   Platelets 166 150 - 450 x10E3/uL   Neutrophils 55 Not Estab. %   Lymphs 33 Not Estab. %   Monocytes 10 Not Estab. %   Eos 2 Not Estab. %   Basos 0 Not Estab. %   Neutrophils Absolute 3.3 1.4 - 7.0 x10E3/uL   Lymphocytes Absolute 1.9 0.7 - 3.1 x10E3/uL   Monocytes Absolute 0.6 0.1 - 0.9 x10E3/uL   EOS (ABSOLUTE) 0.1 0.0 - 0.4 x10E3/uL   Basophils Absolute 0.0 0.0 - 0.2 x10E3/uL   Immature Granulocytes 0 Not Estab. %   Immature Grans (Abs) 0.0 0.0 -  0.1 x10E3/uL    Recent Results (from the past 2160 hours)  Lipid panel     Status: Abnormal   Collection Time: 08/16/23 11:37 AM  Result Value Ref Range   Cholesterol, Total 138 100 - 199 mg/dL   Triglycerides 161 0 - 149 mg/dL   HDL 38 (L) >09 mg/dL   VLDL Cholesterol Cal 20 5 - 40 mg/dL   LDL Chol Calc (NIH) 80 0 - 99 mg/dL   Chol/HDL Ratio 3.6 0.0 - 4.4 ratio    Comment:                                   T. Chol/HDL Ratio                                             Men  Women                               1/2 Avg.Risk  3.4    3.3                                   Avg.Risk  5.0    4.4                                2X Avg.Risk  9.6    7.1                                3X Avg.Risk 23.4   11.0   VITAMIN D 25 Hydroxy (Vit-D Deficiency, Fractures)     Status: None    Collection Time: 08/16/23 11:37 AM  Result Value Ref Range   Vit D, 25-Hydroxy 63.8 30.0 - 100.0 ng/mL    Comment: Vitamin D deficiency has been defined by the Institute of Medicine and an Endocrine Society practice guideline as a level of serum 25-OH vitamin D less than 20 ng/mL (1,2). The Endocrine Society went on to further define vitamin D insufficiency as a level between 21 and 29 ng/mL (2). 1. IOM (Institute of Medicine). 2010. Dietary reference    intakes for calcium and D. [place]: The    [employer]. 2. Holick MF, Binkley , Bischoff-Ferrari HA, et al.    Evaluation, treatment, and prevention of vitamin D    deficiency: an Endocrine Society clinical practice    guideline. JCEM. 2011 Jul; 96(7):1911-30.   CMP14+EGFR     Status: Abnormal   Collection Time: 08/16/23 11:37 AM  Result Value Ref Range   Glucose 257 (H) 70 - 99 mg/dL   BUN 22 8 - 27 mg/dL   Creatinine, Ser 6.04 (H) 0.57 - 1.00 mg/dL   eGFR 58 (L) >54 UJ/WJX/9.14   BUN/Creatinine Ratio 20 12 - 28   Sodium 136 134 - 144 mmol/L   Potassium 5.3 (H) 3.5 - 5.2 mmol/L   Chloride 97 96 - 106 mmol/L   CO2 26 20 - 29 mmol/L   Calcium 9.6 8.7 - 10.3 mg/dL   Total Protein 6.2 6.0 - 8.5 g/dL   Albumin 3.8 (  L) 3.9 - 4.9 g/dL   Globulin, Total 2.4 1.5 - 4.5 g/dL   Bilirubin Total 0.3 0.0 - 1.2 mg/dL   Alkaline Phosphatase 77 44 - 121 IU/L   AST 29 0 - 40 IU/L   ALT 23 0 - 32 IU/L  TSH     Status: None   Collection Time: 08/16/23 11:37 AM  Result Value Ref Range   TSH 2.010 0.450 - 4.500 uIU/mL  Hemoglobin A1c     Status: Abnormal   Collection Time: 08/16/23 11:37 AM  Result Value Ref Range   Hgb A1c MFr Bld 11.3 (H) 4.8 - 5.6 %    Comment:          Prediabetes: 5.7 - 6.4          Diabetes: >6.4          Glycemic control for adults with diabetes: <7.0    Est. average glucose Bld gHb Est-mCnc 278 mg/dL  Vitamin Z61     Status: Abnormal   Collection Time: 08/16/23 11:37 AM  Result Value Ref  Range   Vitamin B-12 >2000 (H) 232 - 1245 pg/mL  CBC with Diff     Status: None   Collection Time: 08/16/23 11:37 AM  Result Value Ref Range   WBC 5.9 3.4 - 10.8 x10E3/uL   RBC 4.63 3.77 - 5.28 x10E6/uL   Hemoglobin 13.2 11.1 - 15.9 g/dL   Hematocrit 09.6 04.5 - 46.6 %   MCV 88 79 - 97 fL   MCH 28.5 26.6 - 33.0 pg   MCHC 32.6 31.5 - 35.7 g/dL   RDW 40.9 81.1 - 91.4 %   Platelets 166 150 - 450 x10E3/uL   Neutrophils 55 Not Estab. %   Lymphs 33 Not Estab. %   Monocytes 10 Not Estab. %   Eos 2 Not Estab. %   Basos 0 Not Estab. %   Neutrophils Absolute 3.3 1.4 - 7.0 x10E3/uL   Lymphocytes Absolute 1.9 0.7 - 3.1 x10E3/uL   Monocytes Absolute 0.6 0.1 - 0.9 x10E3/uL   EOS (ABSOLUTE) 0.1 0.0 - 0.4 x10E3/uL   Basophils Absolute 0.0 0.0 - 0.2 x10E3/uL   Immature Granulocytes 0 Not Estab. %   Immature Grans (Abs) 0.0 0.0 - 0.1 x10E3/uL  POCT Urinalysis Dipstick ([id number])     Status: Abnormal   Collection Time: 09/29/23  1:52 PM  Result Value Ref Range   Color, UA     Clarity, UA     Glucose, UA Positive (A) Negative   Bilirubin, UA Negative    Ketones, UA Negative    Spec Grav, UA 1.010 1.010 - 1.025   Blood, UA Negative    pH, UA 6.0 5.0 - 8.0   Protein, UA Negative Negative   Urobilinogen, UA 0.2 0.2 or 1.0 E.U./dL   Nitrite, UA Negative    Leukocytes, UA Negative Negative   Appearance     Odor         Assessment & Plan:   Problem List Items Addressed This Visit       Cardiovascular and Mediastinum   Essential hypertension   Blood pressure well controlled with current medications.  Continue current therapy.  Will reassess at follow up.        Relevant Orders   CMP14+EGFR (Completed)   TSH (Completed)   CBC with Diff (Completed)     Endocrine   Type 2 diabetes mellitus with diabetic neuropathy, with long-term current use of insulin (HCC) - Primary  Checking labs today. Will call pt. With results  Continue current diabetes POC, as patient has been well  controlled on current regimen.  Will adjust meds if needed based on labs.        Relevant Medications   insulin glargine (LANTUS SOLOSTAR) 100 UNIT/ML Solostar Pen   Semaglutide, 1 MG/DOSE, (OZEMPIC, 1 MG/DOSE,) 4 MG/3ML SOPN   Other Relevant Orders   CMP14+EGFR (Completed)   TSH (Completed)   Hemoglobin A1c (Completed)   CBC with Diff (Completed)   Hyperlipidemia associated with type 2 diabetes mellitus (HCC)   Checking labs today.  Continue current therapy for lipid control. Will modify as needed based on labwork results.        Relevant Medications   insulin glargine (LANTUS SOLOSTAR) 100 UNIT/ML Solostar Pen   Semaglutide, 1 MG/DOSE, (OZEMPIC, 1 MG/DOSE,) 4 MG/3ML SOPN   Other Relevant Orders   Lipid panel (Completed)   CMP14+EGFR (Completed)   TSH (Completed)   CBC with Diff (Completed)     Other   B12 deficiency   B12 injection given in office today.  Return for next injection per provider instructions.       Relevant Orders   CMP14+EGFR (Completed)   TSH (Completed)   Vitamin B12 (Completed)   CBC with Diff (Completed)   Iron deficiency anemia   Checking labs today.  Will continue supplements as needed.        Relevant Orders   CMP14+EGFR (Completed)   TSH (Completed)   CBC with Diff (Completed)   Vitamin D deficiency, unspecified   Checking labs today.  Will continue supplements as needed.        Relevant Orders   VITAMIN D 25 Hydroxy (Vit-D Deficiency, Fractures) (Completed)   CMP14+EGFR (Completed)   TSH (Completed)   CBC with Diff (Completed)    Return in about 3 months (around 11/14/2023).   Total time spent: 20 minutes  Miki Kins, FNP  08/16/2023   This document may have been prepared by [facility] Voice Recognition software and as such may include unintentional dictation errors.

## 2023-08-17 LAB — CMP14+EGFR
ALT: 23 [IU]/L (ref 0–32)
AST: 29 [IU]/L (ref 0–40)
Albumin: 3.8 g/dL — ABNORMAL LOW (ref 3.9–4.9)
Alkaline Phosphatase: 77 [IU]/L (ref 44–121)
BUN/Creatinine Ratio: 20 (ref 12–28)
BUN: 22 mg/dL (ref 8–27)
Bilirubin Total: 0.3 mg/dL (ref 0.0–1.2)
CO2: 26 mmol/L (ref 20–29)
Calcium: 9.6 mg/dL (ref 8.7–10.3)
Chloride: 97 mmol/L (ref 96–106)
Creatinine, Ser: 1.08 mg/dL — ABNORMAL HIGH (ref 0.57–1.00)
Globulin, Total: 2.4 g/dL (ref 1.5–4.5)
Glucose: 257 mg/dL — ABNORMAL HIGH (ref 70–99)
Potassium: 5.3 mmol/L — ABNORMAL HIGH (ref 3.5–5.2)
Sodium: 136 mmol/L (ref 134–144)
Total Protein: 6.2 g/dL (ref 6.0–8.5)
eGFR: 58 mL/min/{1.73_m2} — ABNORMAL LOW (ref >59)

## 2023-08-17 LAB — CBC WITH DIFFERENTIAL/PLATELET
Basophils Absolute: 0 10*3/uL (ref 0.0–0.2)
Basos: 0 %
EOS (ABSOLUTE): 0.1 10*3/uL (ref 0.0–0.4)
Eos: 2 %
Hematocrit: 40.5 % (ref 34.0–46.6)
Hemoglobin: 13.2 g/dL (ref 11.1–15.9)
Immature Grans (Abs): 0 10*3/uL (ref 0.0–0.1)
Immature Granulocytes: 0 %
Lymphocytes Absolute: 1.9 10*3/uL (ref 0.7–3.1)
Lymphs: 33 %
MCH: 28.5 pg (ref 26.6–33.0)
MCHC: 32.6 g/dL (ref 31.5–35.7)
MCV: 88 fL (ref 79–97)
Monocytes Absolute: 0.6 10*3/uL (ref 0.1–0.9)
Monocytes: 10 %
Neutrophils Absolute: 3.3 10*3/uL (ref 1.4–7.0)
Neutrophils: 55 %
Platelets: 166 10*3/uL (ref 150–450)
RBC: 4.63 x10E6/uL (ref 3.77–5.28)
RDW: 14.6 % (ref 11.7–15.4)
WBC: 5.9 10*3/uL (ref 3.4–10.8)

## 2023-08-17 LAB — LIPID PANEL
Chol/HDL Ratio: 3.6 {ratio} (ref 0.0–4.4)
Cholesterol, Total: 138 mg/dL (ref 100–199)
HDL: 38 mg/dL — ABNORMAL LOW (ref >39)
LDL Chol Calc (NIH): 80 mg/dL (ref 0–99)
Triglycerides: 107 mg/dL (ref 0–149)
VLDL Cholesterol Cal: 20 mg/dL (ref 5–40)

## 2023-08-17 LAB — HEMOGLOBIN A1C
Est. average glucose Bld gHb Est-mCnc: 278 mg/dL
Hgb A1c MFr Bld: 11.3 % — ABNORMAL HIGH (ref 4.8–5.6)

## 2023-08-17 LAB — VITAMIN D 25 HYDROXY (VIT D DEFICIENCY, FRACTURES): Vit D, 25-Hydroxy: 63.8 ng/mL (ref 30.0–100.0)

## 2023-08-17 LAB — TSH: TSH: 2.01 u[IU]/mL (ref 0.450–4.500)

## 2023-08-17 LAB — VITAMIN B12: Vitamin B-12: >2000 pg/mL — ABNORMAL HIGH (ref 232–1245)

## 2023-09-02 ENCOUNTER — Other Ambulatory Visit: Payer: Self-pay | Admitting: Family

## 2023-09-18 ENCOUNTER — Ambulatory Visit: Payer: Medicaid Other | Admitting: Family

## 2023-09-18 DIAGNOSIS — E538 Deficiency of other specified B group vitamins: Secondary | ICD-10-CM

## 2023-09-18 MED ORDER — CYANOCOBALAMIN 1000 MCG/ML IJ SOLN
1000.0000 ug | Freq: Once | INTRAMUSCULAR | Status: AC
  Administered 2023-09-18: 1000 ug via INTRAMUSCULAR

## 2023-09-18 NOTE — Progress Notes (Signed)
   CHIEF COMPLAINT  B12 Shot     REASON FOR VISIT  B12 Injection     ASSESSMENT  B12 Deficiency, Unspecified     PLAN  Diagnoses and all orders for this visit:  B12 deficiency -     cyanocobalamin (VITAMIN B12) injection 1,000 mcg     Pt. given B12 injection in clinic.  Return for next injection per provider instructions.   Total time spent: 5 minutes  Miki Kins, FNP  09/18/2023

## 2023-09-29 ENCOUNTER — Ambulatory Visit: Payer: Medicaid Other | Admitting: Cardiology

## 2023-09-29 ENCOUNTER — Encounter: Payer: Self-pay | Admitting: Cardiology

## 2023-09-29 VITALS — BP 128/60 | HR 81 | Ht 62.0 in | Wt 173.0 lb

## 2023-09-29 DIAGNOSIS — R109 Unspecified abdominal pain: Secondary | ICD-10-CM

## 2023-09-29 DIAGNOSIS — J0140 Acute pansinusitis, unspecified: Secondary | ICD-10-CM | POA: Diagnosis not present

## 2023-09-29 DIAGNOSIS — Z0130 Encounter for examination of blood pressure without abnormal findings: Secondary | ICD-10-CM

## 2023-09-29 LAB — POCT URINALYSIS DIPSTICK
Bilirubin, UA: NEGATIVE
Blood, UA: NEGATIVE
Glucose, UA: POSITIVE — AB
Ketones, UA: NEGATIVE
Leukocytes, UA: NEGATIVE
Nitrite, UA: NEGATIVE
Protein, UA: NEGATIVE
Spec Grav, UA: 1.01 (ref 1.010–1.025)
Urobilinogen, UA: 0.2 U/dL
pH, UA: 6 (ref 5.0–8.0)

## 2023-09-29 MED ORDER — METHYLPREDNISOLONE 4 MG PO TBPK: ORAL_TABLET | ORAL | 0 refills | Status: DC

## 2023-09-29 MED ORDER — DOXYCYCLINE HYCLATE 100 MG PO TABS: 100.0000 mg | ORAL_TABLET | Freq: Two times a day (BID) | ORAL | 0 refills | Status: AC

## 2023-09-29 NOTE — Progress Notes (Signed)
Established Patient Office Visit  Subjective:  Patient ID: Renee Bush, female    DOB: 1960-09-10  Age: 63 y.o. MRN: [id number]  Chief Complaint  Patient presents with   Acute Visit    Lower Back Pain and Possible Sinus Infection    Patient in office for an acute visit, complaining of lower back pain and sinus issues. Patient complaining of PND, rhinorrhea, congestion, sinus pain, sore throat, sneezing, cough, congestion, and shortness of breath. Will send in Doxycycline and medrol dose pack, increase water intake. Patient also complaining of flank pain. UA normal. Will send for culture.   Sinus Problem This is a new problem. The current episode started in the past 7 days. The problem is unchanged. There has been no fever. Associated symptoms include congestion, coughing, shortness of breath, sinus pressure, sneezing and a sore throat. Pertinent negatives include no ear pain. Treatments tried: Mucinex. The treatment provided no relief.  Flank Pain This is a new problem. The current episode started in the past 7 days. The problem occurs intermittently. The problem is unchanged. The quality of the pain is described as aching. The pain does not radiate. The pain is mild. Pertinent negatives include no dysuria or fever. The treatment provided no relief.    No other concerns at this time.   Past Medical History:  Diagnosis Date   AKI (acute kidney injury) (HCC) 07/07/2022   Antibiotic-induced yeast infection 01/09/2020   Balance problem 12/18/2019   Blood in stool 12/28/2021   Chest pain 10/14/2021   Chronic kidney disease, stage III (moderate) (HCC) 10/06/2017   Constipation 07/13/2021   Diabetes mellitus without complication (HCC)    DKA, type 2 (HCC) 04/16/2022   ETOH abuse 04/18/2015   Family history of colonic polyps    GERD (gastroesophageal reflux disease)    History of allergy 06/11/2019   Hospital discharge follow-up 01/06/2022   Hypercholesteremia     Hypertension 09/10/2015   Hypokalemia 04/17/2022   Palpitations 10/28/2021   Persistent dry cough 11/16/2021   Sarcoidosis 10/12/2021   self-reported   Shortness of breath 10/28/2021   Urinary frequency 07/02/2020   Urinary tract infection 11/16/2021    Past Surgical History:  Procedure Laterality Date   APPENDECTOMY     CESAREAN SECTION  1991   COLONOSCOPY WITH PROPOFOL N/A 06/03/2019   Procedure: COLONOSCOPY WITH PROPOFOL;  Surgeon: Wyline Mood, MD;  Location: [facility] ENDOSCOPY;  Service: Gastroenterology;  Laterality: N/A;    Social History   Socioeconomic History   Marital status: Married    Spouse name: Channing Mutters   Number of children: 4   Years of education: Not on file   Highest education level: 11th grade  Occupational History   Occupation: na  Tobacco Use   Smoking status: Former    Current packs/day: 0.00    Average packs/day: 1 pack/day for 5.0 years (5.0 ttl pk-yrs)    Types: Cigarettes    Start date: 04/17/2000    Quit date: 04/17/2005    Years since quitting: 18.4   Smokeless tobacco: Never  Vaping Use   Vaping status: Never Used  Substance and Sexual Activity   Alcohol use: Not Currently    Alcohol/week: 14.0 standard drinks of alcohol    Types: 14 Cans of beer per week    Comment: quit ~2020   Drug use: No   Sexual activity: Not Currently  Other Topics Concern   Not on file  Social History Narrative   Lives in mobile home paid for  Social Drivers of Corporate investment banker Strain: Low Risk  (05/19/2020)   Overall Financial Resource Strain (CARDIA)    Difficulty of Paying Living Expenses: Not hard at all  Food Insecurity: No Food Insecurity (05/11/2021)   Hunger Vital Sign    Worried About Running Out of Food in the Last Year: Never true    Ran Out of Food in the Last Year: Never true  Transportation Needs: No Transportation Needs (05/11/2021)   PRAPARE - Administrator, Civil Service (Medical): No    Lack of Transportation (Non-Medical):  No  Physical Activity: Insufficiently Active (05/19/2020)   Exercise Vital Sign    Days of Exercise per Week: 7 days    Minutes of Exercise per Session: 10 min  Stress: Stress Concern Present (05/28/2020)   [employer] of Occupational Health - Occupational Stress Questionnaire    Feeling of Stress : To some extent  Social Connections: Moderately Isolated (05/28/2020)   Social Connection and Isolation Panel [NHANES]    Frequency of Communication with Friends and Family: More than three times a week    Frequency of Social Gatherings with Friends and Family: More than three times a week    Attends Religious Services: Never    Database administrator or Organizations: No    Attends Banker Meetings: Never    Marital Status: Married  Catering manager Violence: Not At Risk (05/19/2020)   Humiliation, Afraid, Rape, and Kick questionnaire    Fear of Current or Ex-Partner: No    Emotionally Abused: No    Physically Abused: No    Sexually Abused: No    Family History  Problem Relation Age of Onset   COPD Mother    Alzheimer's disease Mother    Hypertension Father    Hyperlipidemia Father    Heart attack Father    Diabetes type II Father    Lupus Father    Diabetes type II Sister    Diabetes type II Sister    Diabetes type II Sister    Diabetes type II Sister    Gestational diabetes Daughter     Allergies  Allergen Reactions   Etodolac Rash   Penicillin G Rash   Penicillins Rash    Has patient had a PCN reaction causing immediate rash, facial/tongue/throat swelling, SOB or lightheadedness with hypotension: Yes  Has patient had a PCN reaction causing severe rash involving mucus membranes or skin necrosis: No  Has patient had a PCN reaction that required hospitalization: No  Has patient had a PCN reaction occurring within the last 10 years: No  If all of the above answers are "NO", then may proceed with Cephalosporin use.  Has patient had a PCN reaction causing  immediate rash, facial/tongue/throat swelling, SOB or lightheadedness with hypotension: Yes Has patient had a PCN reaction causing severe rash involving mucus membranes or skin necrosis: No Has patient had a PCN reaction that required hospitalization: No Has patient had a PCN reaction occurring within the last 10 years: No If all of the above answers are "NO", then may proceed with Cephalosporin use.    Outpatient Medications Prior to Visit  Medication Sig   ACCRUFER 30 MG CAPS TAKE 1 CAPSULE(30 MG) BY MOUTH TWICE DAILY   albuterol (PROVENTIL HFA) 108 (90 Base) MCG/ACT inhaler Inhale 2 puffs into the lungs every 6 (six) hours as needed for wheezing or shortness of breath.   azelastine (ASTELIN) 0.1 % nasal spray Place 1 spray into both  nostrils 2 (two) times daily. Use in each nostril as directed   Blood Glucose Monitoring Suppl (ONETOUCH VERIO) w/Device KIT Use to check glucose up to twice daily (Patient not taking: Reported on 08/16/2023)   cetirizine (ZYRTEC) 10 MG tablet Take 1 tablet (10 mg total) by mouth daily.   diclofenac (VOLTAREN) 75 MG EC tablet Take 1 tablet (75 mg total) by mouth 2 (two) times daily.   DULoxetine (CYMBALTA) 60 MG capsule Take 1 capsule (60 mg total) by mouth 2 (two) times daily.   Finerenone (KERENDIA) 10 MG TABS Take 1 tablet (10 mg total) by mouth daily.   gabapentin (NEURONTIN) 400 MG capsule TAKE ONE CAPSULE BY MOUTH TWICE DAILY AND 2 AT BEDTIME AS DIRECTED   insulin glargine (LANTUS SOLOSTAR) 100 UNIT/ML Solostar Pen Inject 17 Units into the skin at bedtime.   insulin lispro (HUMALOG) 100 UNIT/ML injection INJECT 2 UNITS UNDER THE SKIN THREE TIMES DAILY BEFORE MEALS SLIDING SCALE MAX 30 UNITS PER DAY DISCARD VIAL AFTER 28 DAYS ONCE PUNCTURED   Insulin Pen Needle 32G X 6 MM MISC Use as directed.   Magnesium Oxide 400 MG CAPS Take 1 capsule (400 mg total) by mouth daily in the afternoon.   metFORMIN (GLUCOPHAGE) 500 MG tablet Take 1 tablet (500 mg total) by mouth 2  (two) times daily.   metoprolol tartrate (LOPRESSOR) 25 MG tablet TAKE 2 TABLETS(50 MG) BY MOUTH TWICE DAILY   montelukast (SINGULAIR) 10 MG tablet Take 1 tablet (10 mg total) by mouth daily.   nitroGLYCERIN (NITROSTAT) 0.4 MG SL tablet DISSOLVE 1 TABLET UNDER TONGUE AS NEEDED FOR CHEST PAIN EVERY 5 MINUTES FOR MAX OF 3 DOSES IN 15 MINUTES. IF NO RELIEF AFTER FIRST DOSE CALL 911. (Patient not taking: Reported on 08/16/2023)   Omega-3 Fatty Acids (FISH OIL) 1000 MG CAPS Take 1,000 mg by mouth 2 (two) times daily.   omeprazole (PRILOSEC) 20 MG capsule TAKE ONE CAPSULE BY MOUTH TWICE DAILY   rosuvastatin (CRESTOR) 5 MG tablet TAKE ONE TABLET BY MOUTH ONCE DAILY   Semaglutide, 1 MG/DOSE, (OZEMPIC, 1 MG/DOSE,) 4 MG/3ML SOPN Inject 1 mg into the skin once a week.   SYMBICORT 160-4.5 MCG/ACT inhaler Inhale 2 puffs into the lungs 2 (two) times daily. Rinse your mouth well after use.   XIIDRA 5 % SOLN Apply 1 drop to eye 2 (two) times daily.   No facility-administered medications prior to visit.    Review of Systems  Constitutional: Negative.  Negative for fever.  HENT:  Positive for congestion, sinus pressure, sinus pain, sneezing and sore throat. Negative for ear pain.   Eyes: Negative.   Respiratory:  Positive for cough, sputum production and shortness of breath.   Cardiovascular: Negative.   Gastrointestinal: Negative.  Negative for constipation and diarrhea.  Genitourinary:  Positive for flank pain, frequency and urgency. Negative for dysuria.  Skin: Negative.   Endo/Heme/Allergies: Negative.   All other systems reviewed and are negative.      Objective:   BP 128/60   Pulse 81   Ht 5\' 2"  (1.575 m)   Wt 173 lb (78.5 kg)   SpO2 98%   BMI 31.64 kg/m   Vitals:   09/29/23 1349  BP: 128/60  Pulse: 81  Height: 5\' 2"  (1.575 m)  Weight: 173 lb (78.5 kg)  SpO2: 98%  BMI (Calculated): 31.63    Physical Exam Vitals and nursing note reviewed.  Constitutional:      Appearance:  Normal appearance. She is normal  weight.  HENT:     Head: Normocephalic and atraumatic.     Nose: Nose normal.     Mouth/Throat:     Mouth: Mucous membranes are moist.  Eyes:     Extraocular Movements: Extraocular movements intact.     Conjunctiva/sclera: Conjunctivae normal.     Pupils: Pupils are equal, round, and reactive to light.  Cardiovascular:     Rate and Rhythm: Normal rate and regular rhythm.     Pulses: Normal pulses.     Heart sounds: Normal heart sounds.  Pulmonary:     Effort: Pulmonary effort is normal.     Breath sounds: Normal breath sounds.  Abdominal:     General: Abdomen is flat. Bowel sounds are normal.     Palpations: Abdomen is soft.  Musculoskeletal:        General: Normal range of motion.     Cervical back: Normal range of motion.  Skin:    General: Skin is warm and dry.  Neurological:     General: No focal deficit present.     Mental Status: She is alert and oriented to person, place, and time.  Psychiatric:        Mood and Affect: Mood normal.        Behavior: Behavior normal.        Thought Content: Thought content normal.        Judgment: Judgment normal.      Results for orders placed or performed in visit on 09/29/23  POCT Urinalysis Dipstick (81002)  Result Value Ref Range   Color, UA     Clarity, UA     Glucose, UA Positive (A) Negative   Bilirubin, UA Negative    Ketones, UA Negative    Spec Grav, UA 1.010 1.010 - 1.025   Blood, UA Negative    pH, UA 6.0 5.0 - 8.0   Protein, UA Negative Negative   Urobilinogen, UA 0.2 0.2 or 1.0 E.U./dL   Nitrite, UA Negative    Leukocytes, UA Negative Negative   Appearance     Odor      Recent Results (from the past 2160 hours)  Lipid panel     Status: Abnormal   Collection Time: 08/16/23 11:37 AM  Result Value Ref Range   Cholesterol, Total 138 100 - 199 mg/dL   Triglycerides 191 0 - 149 mg/dL   HDL 38 (L) >47 mg/dL   VLDL Cholesterol Cal 20 5 - 40 mg/dL   LDL Chol Calc (NIH) 80 0 -  99 mg/dL   Chol/HDL Ratio 3.6 0.0 - 4.4 ratio    Comment:                                   T. Chol/HDL Ratio                                             Men  Women                               1/2 Avg.Risk  3.4    3.3  Avg.Risk  5.0    4.4                                2X Avg.Risk  9.6    7.1                                3X Avg.Risk 23.4   11.0   VITAMIN D 25 Hydroxy (Vit-D Deficiency, Fractures)     Status: None   Collection Time: 08/16/23 11:37 AM  Result Value Ref Range   Vit D, 25-Hydroxy 63.8 30.0 - 100.0 ng/mL    Comment: Vitamin D deficiency has been defined by the Institute of Medicine and an Endocrine Society practice guideline as a level of serum 25-OH vitamin D less than 20 ng/mL (1,2). The Endocrine Society went on to further define vitamin D insufficiency as a level between 21 and 29 ng/mL (2). 1. IOM (Institute of Medicine). 2010. Dietary reference    intakes for calcium and D. [place]: The    [employer]. 2. Holick MF, [place], Bischoff-Ferrari HA, et al.    Evaluation, treatment, and prevention of vitamin D    deficiency: an Endocrine Society clinical practice    guideline. JCEM. 2011 Jul; 96(7):1911-30.   CMP14+EGFR     Status: Abnormal   Collection Time: 08/16/23 11:37 AM  Result Value Ref Range   Glucose 257 (H) 70 - 99 mg/dL   BUN 22 8 - 27 mg/dL   Creatinine, Ser 1.30 (H) 0.57 - 1.00 mg/dL   eGFR 58 (L) >86 VH/QIO/9.62   BUN/Creatinine Ratio 20 12 - 28   Sodium 136 134 - 144 mmol/L   Potassium 5.3 (H) 3.5 - 5.2 mmol/L   Chloride 97 96 - 106 mmol/L   CO2 26 20 - 29 mmol/L   Calcium 9.6 8.7 - 10.3 mg/dL   Total Protein 6.2 6.0 - 8.5 g/dL   Albumin 3.8 (L) 3.9 - 4.9 g/dL   Globulin, Total 2.4 1.5 - 4.5 g/dL   Bilirubin Total 0.3 0.0 - 1.2 mg/dL   Alkaline Phosphatase 77 44 - 121 IU/L   AST 29 0 - 40 IU/L   ALT 23 0 - 32 IU/L  TSH     Status: None   Collection Time: 08/16/23 11:37 AM  Result  Value Ref Range   TSH 2.010 0.450 - 4.500 uIU/mL  Hemoglobin A1c     Status: Abnormal   Collection Time: 08/16/23 11:37 AM  Result Value Ref Range   Hgb A1c MFr Bld 11.3 (H) 4.8 - 5.6 %    Comment:          Prediabetes: 5.7 - 6.4          Diabetes: >6.4          Glycemic control for adults with diabetes: <7.0    Est. average glucose Bld gHb Est-mCnc 278 mg/dL  Vitamin X52     Status: Abnormal   Collection Time: 08/16/23 11:37 AM  Result Value Ref Range   Vitamin B-12 >2000 (H) 232 - 1245 pg/mL  CBC with Diff     Status: None   Collection Time: 08/16/23 11:37 AM  Result Value Ref Range   WBC 5.9 3.4 - 10.8 x10E3/uL   RBC 4.63 3.77 - 5.28 x10E6/uL   Hemoglobin 13.2 11.1 - 15.9 g/dL   Hematocrit 84.1 32.4 -  46.6 %   MCV 88 79 - 97 fL   MCH 28.5 26.6 - 33.0 pg   MCHC 32.6 31.5 - 35.7 g/dL   RDW 16.1 09.6 - 04.5 %   Platelets 166 150 - 450 x10E3/uL   Neutrophils 55 Not Estab. %   Lymphs 33 Not Estab. %   Monocytes 10 Not Estab. %   Eos 2 Not Estab. %   Basos 0 Not Estab. %   Neutrophils Absolute 3.3 1.4 - 7.0 x10E3/uL   Lymphocytes Absolute 1.9 0.7 - 3.1 x10E3/uL   Monocytes Absolute 0.6 0.1 - 0.9 x10E3/uL   EOS (ABSOLUTE) 0.1 0.0 - 0.4 x10E3/uL   Basophils Absolute 0.0 0.0 - 0.2 x10E3/uL   Immature Granulocytes 0 Not Estab. %   Immature Grans (Abs) 0.0 0.0 - 0.1 x10E3/uL  POCT Urinalysis Dipstick (40981)     Status: Abnormal   Collection Time: 09/29/23  1:52 PM  Result Value Ref Range   Color, UA     Clarity, UA     Glucose, UA Positive (A) Negative   Bilirubin, UA Negative    Ketones, UA Negative    Spec Grav, UA 1.010 1.010 - 1.025   Blood, UA Negative    pH, UA 6.0 5.0 - 8.0   Protein, UA Negative Negative   Urobilinogen, UA 0.2 0.2 or 1.0 E.U./dL   Nitrite, UA Negative    Leukocytes, UA Negative Negative   Appearance     Odor        Assessment & Plan:  Doxycycline Medrol dose pack Increase fluid intake Urine culture  Problem List Items Addressed This  Visit       Respiratory   Acute non-recurrent pansinusitis   Relevant Medications   doxycycline (VIBRA-TABS) 100 MG tablet   methylPREDNISolone (MEDROL DOSEPAK) 4 MG TBPK tablet     Other   Flank pain - Primary   Relevant Orders   POCT Urinalysis Dipstick (19147) (Completed)   Urine Culture    Return if symptoms worsen or fail to improve.   Total time spent: 25 minutes  [employer], NP  09/29/2023   This document may have been prepared by Dragon Voice Recognition software and as such may include unintentional dictation errors.

## 2023-10-02 ENCOUNTER — Other Ambulatory Visit: Payer: Self-pay | Admitting: Family

## 2023-10-02 DIAGNOSIS — Z794 Long term (current) use of insulin: Secondary | ICD-10-CM

## 2023-10-07 ENCOUNTER — Other Ambulatory Visit: Payer: Self-pay | Admitting: Family

## 2023-10-07 DIAGNOSIS — E1140 Type 2 diabetes mellitus with diabetic neuropathy, unspecified: Secondary | ICD-10-CM

## 2023-10-18 ENCOUNTER — Ambulatory Visit (INDEPENDENT_AMBULATORY_CARE_PROVIDER_SITE_OTHER): Payer: Medicaid Other | Admitting: Family

## 2023-10-18 DIAGNOSIS — E538 Deficiency of other specified B group vitamins: Secondary | ICD-10-CM

## 2023-10-18 MED ORDER — CYANOCOBALAMIN 1000 MCG/ML IJ SOLN
1000.0000 ug | Freq: Once | INTRAMUSCULAR | Status: AC
  Administered 2023-10-18: 1000 ug via INTRAMUSCULAR

## 2023-10-19 ENCOUNTER — Ambulatory Visit: Payer: Medicaid Other

## 2023-10-29 ENCOUNTER — Other Ambulatory Visit: Payer: Self-pay | Admitting: Family

## 2023-11-12 ENCOUNTER — Encounter: Payer: Self-pay | Admitting: Family

## 2023-11-12 DIAGNOSIS — E559 Vitamin D deficiency, unspecified: Secondary | ICD-10-CM | POA: Insufficient documentation

## 2023-11-12 NOTE — Assessment & Plan Note (Signed)
 Checking labs today.  Continue current therapy for lipid control. Will modify as needed based on labwork results.

## 2023-11-12 NOTE — Assessment & Plan Note (Signed)
 Checking labs today.  Will continue supplements as needed.

## 2023-11-12 NOTE — Assessment & Plan Note (Signed)
 Checking labs today. Will call pt. With results  Continue current diabetes POC, as patient has been well controlled on current regimen.  Will adjust meds if needed based on labs.

## 2023-11-12 NOTE — Assessment & Plan Note (Signed)
 Blood pressure well controlled with current medications.  Continue current therapy.  Will reassess at follow up.

## 2023-11-12 NOTE — Assessment & Plan Note (Signed)
 B12 injection given in office today.  Return for next injection per provider instructions.

## 2023-11-14 ENCOUNTER — Ambulatory Visit: Payer: Medicaid Other | Admitting: Family

## 2023-11-14 ENCOUNTER — Encounter: Payer: Self-pay | Admitting: Family

## 2023-11-14 VITALS — BP 132/72 | HR 84 | Ht 62.0 in | Wt 183.2 lb

## 2023-11-14 DIAGNOSIS — R109 Unspecified abdominal pain: Secondary | ICD-10-CM

## 2023-11-14 DIAGNOSIS — D509 Iron deficiency anemia, unspecified: Secondary | ICD-10-CM

## 2023-11-14 DIAGNOSIS — I10 Essential (primary) hypertension: Secondary | ICD-10-CM | POA: Diagnosis not present

## 2023-11-14 DIAGNOSIS — N1831 Chronic kidney disease, stage 3a: Secondary | ICD-10-CM

## 2023-11-14 DIAGNOSIS — E1140 Type 2 diabetes mellitus with diabetic neuropathy, unspecified: Secondary | ICD-10-CM

## 2023-11-14 DIAGNOSIS — E559 Vitamin D deficiency, unspecified: Secondary | ICD-10-CM

## 2023-11-14 DIAGNOSIS — Z794 Long term (current) use of insulin: Secondary | ICD-10-CM

## 2023-11-14 DIAGNOSIS — E538 Deficiency of other specified B group vitamins: Secondary | ICD-10-CM

## 2023-11-14 DIAGNOSIS — E1169 Type 2 diabetes mellitus with other specified complication: Secondary | ICD-10-CM

## 2023-11-14 DIAGNOSIS — M545 Low back pain, unspecified: Secondary | ICD-10-CM

## 2023-11-14 DIAGNOSIS — N3946 Mixed incontinence: Secondary | ICD-10-CM

## 2023-11-14 DIAGNOSIS — J0140 Acute pansinusitis, unspecified: Secondary | ICD-10-CM

## 2023-11-14 DIAGNOSIS — E7800 Pure hypercholesterolemia, unspecified: Secondary | ICD-10-CM

## 2023-11-14 DIAGNOSIS — E785 Hyperlipidemia, unspecified: Secondary | ICD-10-CM

## 2023-11-14 LAB — POCT URINALYSIS DIPSTICK
Bilirubin, UA: NEGATIVE
Blood, UA: NEGATIVE
Glucose, UA: POSITIVE — AB
Ketones, UA: NEGATIVE
Nitrite, UA: NEGATIVE
Protein, UA: NEGATIVE
Spec Grav, UA: 1.015 (ref 1.010–1.025)
Urobilinogen, UA: 0.2 U/dL
pH, UA: 6.5 (ref 5.0–8.0)

## 2023-11-14 MED ORDER — METFORMIN HCL 500 MG PO TABS: 500.0000 mg | ORAL_TABLET | Freq: Two times a day (BID) | ORAL | 1 refills | Status: DC

## 2023-11-14 MED ORDER — OMEPRAZOLE 20 MG PO CPDR: 20.0000 mg | DELAYED_RELEASE_CAPSULE | Freq: Two times a day (BID) | ORAL | 3 refills | Status: DC

## 2023-11-14 MED ORDER — FEXOFENADINE HCL 180 MG PO TABS: 180.0000 mg | ORAL_TABLET | Freq: Every day | ORAL | 1 refills | Status: DC

## 2023-11-14 MED ORDER — GABAPENTIN 400 MG PO CAPS: ORAL_CAPSULE | ORAL | 1 refills | Status: DC

## 2023-11-14 MED ORDER — LANTUS SOLOSTAR 100 UNIT/ML ~~LOC~~ SOPN: 17.0000 [IU] | PEN_INJECTOR | Freq: Every day | SUBCUTANEOUS | 3 refills | Status: DC

## 2023-11-14 MED ORDER — INSULIN SYRINGE 31G X 5/16" 0.5 ML MISC: 1.0000 | Freq: Three times a day (TID) | 1 refills | Status: AC

## 2023-11-14 MED ORDER — MOUNJARO 5 MG/0.5ML ~~LOC~~ SOAJ
5.0000 mg | SUBCUTANEOUS | 3 refills | Status: AC
Start: 2023-11-14 — End: ?

## 2023-11-14 MED ORDER — SYMBICORT 160-4.5 MCG/ACT IN AERO: 2.0000 | INHALATION_SPRAY | Freq: Two times a day (BID) | RESPIRATORY_TRACT | 12 refills | Status: AC

## 2023-11-14 MED ORDER — DULOXETINE HCL 60 MG PO CPEP: 60.0000 mg | ORAL_CAPSULE | Freq: Two times a day (BID) | ORAL | 3 refills | Status: DC

## 2023-11-14 MED ORDER — MONTELUKAST SODIUM 10 MG PO TABS: 10.0000 mg | ORAL_TABLET | Freq: Every day | ORAL | 3 refills | Status: DC

## 2023-11-14 MED ORDER — ROSUVASTATIN CALCIUM 5 MG PO TABS: ORAL_TABLET | ORAL | 2 refills | Status: DC

## 2023-11-14 MED ORDER — METOPROLOL TARTRATE 25 MG PO TABS: 50.0000 mg | ORAL_TABLET | Freq: Two times a day (BID) | ORAL | 1 refills | Status: DC

## 2023-11-14 MED ORDER — KERENDIA 10 MG PO TABS: 1.0000 | ORAL_TABLET | Freq: Every day | ORAL | 3 refills | Status: DC

## 2023-11-14 MED ORDER — INSULIN LISPRO 100 UNIT/ML IJ SOLN: INTRAMUSCULAR | 11 refills | Status: AC

## 2023-11-15 LAB — URINALYSIS, ROUTINE W REFLEX MICROSCOPIC
Bilirubin, UA: NEGATIVE
Ketones, UA: NEGATIVE
Nitrite, UA: NEGATIVE
Protein,UA: NEGATIVE
RBC, UA: NEGATIVE
Specific Gravity, UA: 1.02 (ref 1.005–1.030)
Urobilinogen, Ur: 0.2 mg/dL (ref 0.2–1.0)
pH, UA: 6.5 (ref 5.0–7.5)

## 2023-11-15 LAB — CBC WITH DIFFERENTIAL/PLATELET
Basophils Absolute: 0 10*3/uL (ref 0.0–0.2)
Basos: 0 %
EOS (ABSOLUTE): 0.1 10*3/uL (ref 0.0–0.4)
Eos: 1 %
Hematocrit: 37.9 % (ref 34.0–46.6)
Hemoglobin: 12 g/dL (ref 11.1–15.9)
Immature Grans (Abs): 0 10*3/uL (ref 0.0–0.1)
Immature Granulocytes: 0 %
Lymphocytes Absolute: 2 10*3/uL (ref 0.7–3.1)
Lymphs: 26 %
MCH: 29.1 pg (ref 26.6–33.0)
MCHC: 31.7 g/dL (ref 31.5–35.7)
MCV: 92 fL (ref 79–97)
Monocytes Absolute: 0.6 10*3/uL (ref 0.1–0.9)
Monocytes: 8 %
Neutrophils Absolute: 4.9 10*3/uL (ref 1.4–7.0)
Neutrophils: 65 %
Platelets: 169 10*3/uL (ref 150–450)
RBC: 4.12 x10E6/uL (ref 3.77–5.28)
RDW: 14.2 % (ref 11.7–15.4)
WBC: 7.6 10*3/uL (ref 3.4–10.8)

## 2023-11-15 LAB — CMP14+EGFR
ALT: 20 IU/L (ref 0–32)
AST: 28 IU/L (ref 0–40)
Albumin: 3.7 g/dL — ABNORMAL LOW (ref 3.9–4.9)
Alkaline Phosphatase: 74 IU/L (ref 44–121)
BUN/Creatinine Ratio: 13 (ref 12–28)
BUN: 17 mg/dL (ref 8–27)
Bilirubin Total: 0.3 mg/dL (ref 0.0–1.2)
CO2: 25 mmol/L (ref 20–29)
Calcium: 9.6 mg/dL (ref 8.7–10.3)
Chloride: 97 mmol/L (ref 96–106)
Creatinine, Ser: 1.26 mg/dL — ABNORMAL HIGH (ref 0.57–1.00)
Globulin, Total: 1.9 g/dL (ref 1.5–4.5)
Glucose: 231 mg/dL — ABNORMAL HIGH (ref 70–99)
Potassium: 4.6 mmol/L (ref 3.5–5.2)
Sodium: 135 mmol/L (ref 134–144)
Total Protein: 5.6 g/dL — ABNORMAL LOW (ref 6.0–8.5)
eGFR: 48 mL/min/{1.73_m2} — ABNORMAL LOW (ref >59)

## 2023-11-15 LAB — URINE CULTURE

## 2023-11-15 LAB — LIPID PANEL
Chol/HDL Ratio: 3.6 ratio (ref 0.0–4.4)
Cholesterol, Total: 163 mg/dL (ref 100–199)
HDL: 45 mg/dL (ref >39)
LDL Chol Calc (NIH): 94 mg/dL (ref 0–99)
Triglycerides: 138 mg/dL (ref 0–149)
VLDL Cholesterol Cal: 24 mg/dL (ref 5–40)

## 2023-11-15 LAB — VITAMIN D 25 HYDROXY (VIT D DEFICIENCY, FRACTURES): Vit D, 25-Hydroxy: 51.1 ng/mL (ref 30.0–100.0)

## 2023-11-15 LAB — TSH: TSH: 1.52 u[IU]/mL (ref 0.450–4.500)

## 2023-11-15 LAB — MICROSCOPIC EXAMINATION
Bacteria, UA: NONE SEEN
Casts: NONE SEEN /LPF
RBC, Urine: NONE SEEN /HPF (ref 0–2)

## 2023-11-15 LAB — VITAMIN B12: Vitamin B-12: 484 pg/mL (ref 232–1245)

## 2023-11-15 LAB — HEMOGLOBIN A1C
Est. average glucose Bld gHb Est-mCnc: 283 mg/dL
Hgb A1c MFr Bld: 11.5 % — ABNORMAL HIGH (ref 4.8–5.6)

## 2023-11-24 NOTE — Progress Notes (Signed)
 Informed via Mychart message

## 2023-12-02 ENCOUNTER — Other Ambulatory Visit: Payer: Self-pay | Admitting: Family

## 2023-12-31 ENCOUNTER — Other Ambulatory Visit: Payer: Self-pay | Admitting: Family

## 2024-01-04 ENCOUNTER — Other Ambulatory Visit: Payer: Self-pay | Admitting: Family

## 2024-01-04 DIAGNOSIS — E1140 Type 2 diabetes mellitus with diabetic neuropathy, unspecified: Secondary | ICD-10-CM

## 2024-01-12 ENCOUNTER — Other Ambulatory Visit: Payer: Self-pay | Admitting: Family

## 2024-01-12 DIAGNOSIS — E7800 Pure hypercholesterolemia, unspecified: Secondary | ICD-10-CM

## 2024-01-14 ENCOUNTER — Encounter: Payer: Self-pay | Admitting: Family

## 2024-01-14 DIAGNOSIS — N3946 Mixed incontinence: Secondary | ICD-10-CM | POA: Insufficient documentation

## 2024-01-14 NOTE — Assessment & Plan Note (Signed)
 Checking labs today.  Will continue supplements as needed.

## 2024-01-14 NOTE — Assessment & Plan Note (Signed)
 Patient is seen by Nephrology, who manage this condition.  She is well controlled with current therapy.   Will defer to them for further changes to plan of care.

## 2024-01-14 NOTE — Assessment & Plan Note (Signed)
 Checking labs today. Will call pt. With results  Continue current diabetes POC, as patient has been well controlled on current regimen.  Will adjust meds if needed based on labs.

## 2024-01-14 NOTE — Progress Notes (Signed)
 Established Patient Office Visit  Subjective:  Patient ID: Renee Bush, female    DOB: 1960/10/31  Age: 63 y.o. MRN: [id number]  Chief Complaint  Patient presents with   Follow-up    3 month follow up    Patient is here today for her 3 months follow up.  She has been feeling fairly well since last appointment.   She does have additional concerns to discuss today.  She is having significant issues with urinary incontinence, and she says that she did find out from someone that she was eligible to get incontinence supplies with her insurance.  She reports difficulty with mixed stress and urge incontinence.  She has been buying briefs, but they are expensive, and she cannot afford to continue.   Labs are due today. She needs refills.   I have reviewed her active problem list, medication list, allergies, notes from last encounter, lab results for her appointment today.      No other concerns at this time.   Past Medical History:  Diagnosis Date   AKI (acute kidney injury) (HCC) 07/07/2022   Antibiotic-induced yeast infection 01/09/2020   Balance problem 12/18/2019   Blood in stool 12/28/2021   Chest pain 10/14/2021   Chronic kidney disease, stage III (moderate) (HCC) 10/06/2017   Constipation 07/13/2021   Diabetes mellitus without complication (HCC)    DKA, type 2 (HCC) 04/16/2022   ETOH abuse 04/18/2015   Family history of colonic polyps    GERD (gastroesophageal reflux disease)    History of allergy 06/11/2019   Hospital discharge follow-up 01/06/2022   Hypercholesteremia    Hypertension 09/10/2015   Hypokalemia 04/17/2022   Palpitations 10/28/2021   Persistent dry cough 11/16/2021   Sarcoidosis 10/12/2021   self-reported   Shortness of breath 10/28/2021   Urinary frequency 07/02/2020   Urinary tract infection 11/16/2021    Past Surgical History:  Procedure Laterality Date   APPENDECTOMY     CESAREAN SECTION  1991   COLONOSCOPY WITH PROPOFOL  N/A  06/03/2019   Procedure: COLONOSCOPY WITH PROPOFOL ;  Surgeon: Luke Salaam, MD;  Location: [facility] ENDOSCOPY;  Service: Gastroenterology;  Laterality: N/A;    Social History   Socioeconomic History   Marital status: Married    Spouse name: Joanette Moynahan   Number of children: 4   Years of education: Not on file   Highest education level: 11th grade  Occupational History   Occupation: na  Tobacco Use   Smoking status: Former    Current packs/day: 0.00    Average packs/day: 1 pack/day for 5.0 years (5.0 ttl pk-yrs)    Types: Cigarettes    Start date: 04/17/2000    Quit date: 04/17/2005    Years since quitting: 18.7   Smokeless tobacco: Never  Vaping Use   Vaping status: Never Used  Substance and Sexual Activity   Alcohol use: Not Currently    Alcohol/week: 14.0 standard drinks of alcohol    Types: 14 Cans of beer per week    Comment: quit ~2020   Drug use: No   Sexual activity: Not Currently  Other Topics Concern   Not on file  Social History Narrative   Lives in mobile home paid for   Social Drivers of Health   Financial Resource Strain: Low Risk  (05/19/2020)   Overall Financial Resource Strain (CARDIA)    Difficulty of Paying Living Expenses: Not hard at all  Food Insecurity: No Food Insecurity (05/11/2021)   Hunger Vital Sign    Worried About  Running Out of Food in the Last Year: Never true    Ran Out of Food in the Last Year: Never true  Transportation Needs: No Transportation Needs (05/11/2021)   PRAPARE - Administrator, Civil Service (Medical): No    Lack of Transportation (Non-Medical): No  Physical Activity: Insufficiently Active (05/19/2020)   Exercise Vital Sign    Days of Exercise per Week: 7 days    Minutes of Exercise per Session: 10 min  Stress: Stress Concern Present (05/28/2020)   [employer] of Occupational Health - Occupational Stress Questionnaire    Feeling of Stress : To some extent  Social Connections: Moderately Isolated (05/28/2020)   Social  Connection and Isolation Panel [NHANES]    Frequency of Communication with Friends and Family: More than three times a week    Frequency of Social Gatherings with Friends and Family: More than three times a week    Attends Religious Services: Never    Database administrator or Organizations: No    Attends Banker Meetings: Never    Marital Status: Married  Catering manager Violence: Not At Risk (05/19/2020)   Humiliation, Afraid, Rape, and Kick questionnaire    Fear of Current or Ex-Partner: No    Emotionally Abused: No    Physically Abused: No    Sexually Abused: No    Family History  Problem Relation Age of Onset   COPD Mother    Alzheimer's disease Mother    Hypertension Father    Hyperlipidemia Father    Heart attack Father    Diabetes type II Father    Lupus Father    Diabetes type II Sister    Diabetes type II Sister    Diabetes type II Sister    Diabetes type II Sister    Gestational diabetes Daughter     Allergies  Allergen Reactions   Etodolac Rash   Penicillin G Rash   Penicillins Rash    Has patient had a PCN reaction causing immediate rash, facial/tongue/throat swelling, SOB or lightheadedness with hypotension: Yes  Has patient had a PCN reaction causing severe rash involving mucus membranes or skin necrosis: No  Has patient had a PCN reaction that required hospitalization: No  Has patient had a PCN reaction occurring within the last 10 years: No  If all of the above answers are "NO", then may proceed with Cephalosporin use.  Has patient had a PCN reaction causing immediate rash, facial/tongue/throat swelling, SOB or lightheadedness with hypotension: Yes Has patient had a PCN reaction causing severe rash involving mucus membranes or skin necrosis: No Has patient had a PCN reaction that required hospitalization: No Has patient had a PCN reaction occurring within the last 10 years: No If all of the above answers are "NO", then may proceed with  Cephalosporin use.    Review of Systems  Genitourinary:  Positive for frequency and urgency.       Incontinence  All other systems reviewed and are negative.      Objective:   BP 132/72   Pulse 84   Ht 5\' 2"  (1.575 m)   Wt 183 lb 3.2 oz (83.1 kg)   SpO2 95%   BMI 33.51 kg/m   Vitals:   11/14/23 1053  BP: 132/72  Pulse: 84  Height: 5\' 2"  (1.575 m)  Weight: 183 lb 3.2 oz (83.1 kg)  SpO2: 95%  BMI (Calculated): 33.5    Physical Exam Vitals and nursing note reviewed.  Constitutional:  Appearance: Normal appearance. She is obese.  HENT:     Head: Normocephalic.  Eyes:     Extraocular Movements: Extraocular movements intact.     Conjunctiva/sclera: Conjunctivae normal.     Pupils: Pupils are equal, round, and reactive to light.  Cardiovascular:     Rate and Rhythm: Normal rate.  Pulmonary:     Effort: Pulmonary effort is normal.  Neurological:     General: No focal deficit present.     Mental Status: She is alert and oriented to person, place, and time. Mental status is at baseline.  Psychiatric:        Mood and Affect: Mood normal.        Behavior: Behavior normal.        Thought Content: Thought content normal.        Judgment: Judgment normal.      Results for orders placed or performed in visit on 11/14/23  Urine Culture   Specimen: Urine, Clean Catch   UR  Result Value Ref Range   Urine Culture, Routine Final report    Organism ID, Bacteria Comment   Microscopic Examination  Result Value Ref Range   WBC, UA 0-5 0 - 5 /hpf   RBC, Urine None seen 0 - 2 /hpf   Epithelial Cells (non renal) 0-10 0 - 10 /hpf   Casts None seen None seen /lpf   Bacteria, UA None seen None seen/Few  Urinalysis, Routine w reflex microscopic  Result Value Ref Range   Specific Gravity, UA 1.020 1.005 - 1.030   pH, UA 6.5 5.0 - 7.5   Color, UA Yellow Yellow   Appearance Ur Clear Clear   Leukocytes,UA 2+ (A) Negative   Protein,UA Negative Negative/Trace   Glucose,  UA 3+ (A) Negative   Ketones, UA Negative Negative   RBC, UA Negative Negative   Bilirubin, UA Negative Negative   Urobilinogen, Ur 0.2 0.2 - 1.0 mg/dL   Nitrite, UA Negative Negative   Microscopic Examination See below:   Lipid panel  Result Value Ref Range   Cholesterol, Total 163 100 - 199 mg/dL   Triglycerides 098 0 - 149 mg/dL   HDL 45 >11 mg/dL   VLDL Cholesterol Cal 24 5 - 40 mg/dL   LDL Chol Calc (NIH) 94 0 - 99 mg/dL   Chol/HDL Ratio 3.6 0.0 - 4.4 ratio  VITAMIN D  25 Hydroxy (Vit-D Deficiency, Fractures)  Result Value Ref Range   Vit D, 25-Hydroxy 51.1 30.0 - 100.0 ng/mL  CMP14+EGFR  Result Value Ref Range   Glucose 231 (H) 70 - 99 mg/dL   BUN 17 8 - 27 mg/dL   Creatinine, Ser 9.14 (H) 0.57 - 1.00 mg/dL   eGFR 48 (L) >78 GN/FAO/1.30   BUN/Creatinine Ratio 13 12 - 28   Sodium 135 134 - 144 mmol/L   Potassium 4.6 3.5 - 5.2 mmol/L   Chloride 97 96 - 106 mmol/L   CO2 25 20 - 29 mmol/L   Calcium  9.6 8.7 - 10.3 mg/dL   Total Protein 5.6 (L) 6.0 - 8.5 g/dL   Albumin 3.7 (L) 3.9 - 4.9 g/dL   Globulin, Total 1.9 1.5 - 4.5 g/dL   Bilirubin Total 0.3 0.0 - 1.2 mg/dL   Alkaline Phosphatase 74 44 - 121 IU/L   AST 28 0 - 40 IU/L   ALT 20 0 - 32 IU/L  TSH  Result Value Ref Range   TSH 1.520 0.450 - 4.500 uIU/mL  Hemoglobin A1c  Result  Value Ref Range   Hgb A1c MFr Bld 11.5 (H) 4.8 - 5.6 %   Est. average glucose Bld gHb Est-mCnc 283 mg/dL  Vitamin B12  Result Value Ref Range   Vitamin B-12 484 232 - 1,245 pg/mL  CBC with Diff  Result Value Ref Range   WBC 7.6 3.4 - 10.8 x10E3/uL   RBC 4.12 3.77 - 5.28 x10E6/uL   Hemoglobin 12.0 11.1 - 15.9 g/dL   Hematocrit 16.1 09.6 - 46.6 %   MCV 92 79 - 97 fL   MCH 29.1 26.6 - 33.0 pg   MCHC 31.7 31.5 - 35.7 g/dL   RDW 04.5 40.9 - 81.1 %   Platelets 169 150 - 450 x10E3/uL   Neutrophils 65 Not Estab. %   Lymphs 26 Not Estab. %   Monocytes 8 Not Estab. %   Eos 1 Not Estab. %   Basos 0 Not Estab. %   Neutrophils Absolute 4.9  1.4 - 7.0 x10E3/uL   Lymphocytes Absolute 2.0 0.7 - 3.1 x10E3/uL   Monocytes Absolute 0.6 0.1 - 0.9 x10E3/uL   EOS (ABSOLUTE) 0.1 0.0 - 0.4 x10E3/uL   Basophils Absolute 0.0 0.0 - 0.2 x10E3/uL   Immature Granulocytes 0 Not Estab. %   Immature Grans (Abs) 0.0 0.0 - 0.1 x10E3/uL  POCT Urinalysis Dipstick (81002)  Result Value Ref Range   Color, UA Yellow    Clarity, UA Cloudy    Glucose, UA Positive (A) Negative   Bilirubin, UA Negative    Ketones, UA Negative    Spec Grav, UA 1.015 1.010 - 1.025   Blood, UA Negative    pH, UA 6.5 5.0 - 8.0   Protein, UA Negative Negative   Urobilinogen, UA 0.2 0.2 or 1.0 E.U./dL   Nitrite, UA Negative    Leukocytes, UA Small (1+) (A) Negative   Appearance Cloudy    Odor Yes     Recent Results (from the past 2160 hours)  POCT Urinalysis Dipstick (91478)     Status: Abnormal   Collection Time: 11/14/23 11:09 AM  Result Value Ref Range   Color, UA Yellow    Clarity, UA Cloudy    Glucose, UA Positive (A) Negative   Bilirubin, UA Negative    Ketones, UA Negative    Spec Grav, UA 1.015 1.010 - 1.025   Blood, UA Negative    pH, UA 6.5 5.0 - 8.0   Protein, UA Negative Negative   Urobilinogen, UA 0.2 0.2 or 1.0 E.U./dL   Nitrite, UA Negative    Leukocytes, UA Small (1+) (A) Negative   Appearance Cloudy    Odor Yes   Lipid panel     Status: None   Collection Time: 11/14/23 11:44 AM  Result Value Ref Range   Cholesterol, Total 163 100 - 199 mg/dL   Triglycerides 295 0 - 149 mg/dL   HDL 45 >62 mg/dL   VLDL Cholesterol Cal 24 5 - 40 mg/dL   LDL Chol Calc (NIH) 94 0 - 99 mg/dL   Chol/HDL Ratio 3.6 0.0 - 4.4 ratio    Comment:                                   T. Chol/HDL Ratio  Men  Women                               1/2 Avg.Risk  3.4    3.3                                   Avg.Risk  5.0    4.4                                2X Avg.Risk  9.6    7.1                                3X Avg.Risk 23.4    11.0   VITAMIN D  25 Hydroxy (Vit-D Deficiency, Fractures)     Status: None   Collection Time: 11/14/23 11:44 AM  Result Value Ref Range   Vit D, 25-Hydroxy 51.1 30.0 - 100.0 ng/mL    Comment: Vitamin D  deficiency has been defined by the Institute of Medicine and an Endocrine Society practice guideline as a level of serum 25-OH vitamin D  less than 20 ng/mL (1,2). The Endocrine Society went on to further define vitamin D  insufficiency as a level between 21 and 29 ng/mL (2). 1. IOM (Institute of Medicine). 2010. Dietary reference    intakes for calcium  and D. [place]: The    [employer]. 2. Holick MF, Binkley O[place], Bischoff-Ferrari HA, et al.    Evaluation, treatment, and prevention of vitamin D     deficiency: an Endocrine Society clinical practice    guideline. JCEM. 2011 Jul; 96(7):1911-30.   CMP14+EGFR     Status: Abnormal   Collection Time: 11/14/23 11:44 AM  Result Value Ref Range   Glucose 231 (H) 70 - 99 mg/dL   BUN 17 8 - 27 mg/dL   Creatinine, Ser 1.61 (H) 0.57 - 1.00 mg/dL   eGFR 48 (L) >09 UE/AVW/0.98   BUN/Creatinine Ratio 13 12 - 28   Sodium 135 134 - 144 mmol/L   Potassium 4.6 3.5 - 5.2 mmol/L   Chloride 97 96 - 106 mmol/L   CO2 25 20 - 29 mmol/L   Calcium  9.6 8.7 - 10.3 mg/dL   Total Protein 5.6 (L) 6.0 - 8.5 g/dL   Albumin 3.7 (L) 3.9 - 4.9 g/dL   Globulin, Total 1.9 1.5 - 4.5 g/dL   Bilirubin Total 0.3 0.0 - 1.2 mg/dL   Alkaline Phosphatase 74 44 - 121 IU/L   AST 28 0 - 40 IU/L   ALT 20 0 - 32 IU/L  TSH     Status: None   Collection Time: 11/14/23 11:44 AM  Result Value Ref Range   TSH 1.520 0.450 - 4.500 uIU/mL  Hemoglobin A1c     Status: Abnormal   Collection Time: 11/14/23 11:44 AM  Result Value Ref Range   Hgb A1c MFr Bld 11.5 (H) 4.8 - 5.6 %    Comment:          Prediabetes: 5.7 - 6.4          Diabetes: >6.4          Glycemic control for adults with diabetes: <7.0    Est. average glucose Bld gHb Est-mCnc 283 mg/dL  Vitamin B12  Status: None   Collection Time: 11/14/23 11:44 AM  Result Value Ref Range   Vitamin B-12 484 232 - 1,245 pg/mL  CBC with Diff     Status: None   Collection Time: 11/14/23 11:44 AM  Result Value Ref Range   WBC 7.6 3.4 - 10.8 x10E3/uL   RBC 4.12 3.77 - 5.28 x10E6/uL   Hemoglobin 12.0 11.1 - 15.9 g/dL   Hematocrit 16.1 09.6 - 46.6 %   MCV 92 79 - 97 fL   MCH 29.1 26.6 - 33.0 pg   MCHC 31.7 31.5 - 35.7 g/dL   RDW 04.5 40.9 - 81.1 %   Platelets 169 150 - 450 x10E3/uL   Neutrophils 65 Not Estab. %   Lymphs 26 Not Estab. %   Monocytes 8 Not Estab. %   Eos 1 Not Estab. %   Basos 0 Not Estab. %   Neutrophils Absolute 4.9 1.4 - 7.0 x10E3/uL   Lymphocytes Absolute 2.0 0.7 - 3.1 x10E3/uL   Monocytes Absolute 0.6 0.1 - 0.9 x10E3/uL   EOS (ABSOLUTE) 0.1 0.0 - 0.4 x10E3/uL   Basophils Absolute 0.0 0.0 - 0.2 x10E3/uL   Immature Granulocytes 0 Not Estab. %   Immature Grans (Abs) 0.0 0.0 - 0.1 x10E3/uL  Urine Culture     Status: None   Collection Time: 11/14/23  2:27 PM   Specimen: Urine, Clean Catch   UR  Result Value Ref Range   Urine Culture, Routine Final report    Organism ID, Bacteria Comment     Comment: Mixed urogenital flora Less than 10,000 colonies/mL   Urinalysis, Routine w reflex microscopic     Status: Abnormal   Collection Time: 11/14/23  2:28 PM  Result Value Ref Range   Specific Gravity, UA 1.020 1.005 - 1.030   pH, UA 6.5 5.0 - 7.5   Color, UA Yellow Yellow   Appearance Ur Clear Clear   Leukocytes,UA 2+ (A) Negative   Protein,UA Negative Negative/Trace   Glucose, UA 3+ (A) Negative   Ketones, UA Negative Negative   RBC, UA Negative Negative   Bilirubin, UA Negative Negative   Urobilinogen, Ur 0.2 0.2 - 1.0 mg/dL   Nitrite, UA Negative Negative   Microscopic Examination See below:     Comment: Microscopic was indicated and was performed.  Microscopic Examination     Status: None   Collection Time: 11/14/23  2:28 PM  Result Value Ref Range   WBC, UA 0-5 0 - 5  /hpf   RBC, Urine None seen 0 - 2 /hpf   Epithelial Cells (non renal) 0-10 0 - 10 /hpf   Casts None seen None seen /lpf   Bacteria, UA None seen None seen/Few       Assessment & Plan:   Problem List Items Addressed This Visit       Cardiovascular and Mediastinum   Essential hypertension   Blood pressure well controlled with current medications.  Continue current therapy.  Will reassess at follow up.        Relevant Medications   rosuvastatin  (CRESTOR ) 5 MG tablet   metoprolol  tartrate (LOPRESSOR ) 25 MG tablet   Other Relevant Orders   CMP14+EGFR (Completed)   TSH (Completed)   CBC with Diff (Completed)     Endocrine   Type 2 diabetes mellitus with diabetic neuropathy, with long-term current use of insulin  (HCC)   Checking labs today. Will call pt. With results  Continue current diabetes POC, as patient has been well controlled on current  regimen.  Will adjust meds if needed based on labs.        Relevant Medications   tirzepatide (MOUNJARO) 5 MG/0.5ML Pen   rosuvastatin  (CRESTOR ) 5 MG tablet   insulin  glargine (LANTUS  SOLOSTAR) 100 UNIT/ML Solostar Pen   insulin  lispro (HUMALOG ) 100 UNIT/ML injection   Other Relevant Orders   CMP14+EGFR (Completed)   TSH (Completed)   Hemoglobin A1c (Completed)   CBC with Diff (Completed)   Hyperlipidemia associated with type 2 diabetes mellitus (HCC)   Checking labs today.  Continue current therapy for lipid control. Will modify as needed based on labwork results.        Relevant Medications   tirzepatide (MOUNJARO) 5 MG/0.5ML Pen   rosuvastatin  (CRESTOR ) 5 MG tablet   insulin  glargine (LANTUS  SOLOSTAR) 100 UNIT/ML Solostar Pen   insulin  lispro (HUMALOG ) 100 UNIT/ML injection   metoprolol  tartrate (LOPRESSOR ) 25 MG tablet   Other Relevant Orders   Lipid panel (Completed)   CMP14+EGFR (Completed)   TSH (Completed)   CBC with Diff (Completed)     Genitourinary   Chronic kidney disease, stage III (moderate) (HCC)    Patient is seen by Nephrology, who manage this condition.  She is well controlled with current therapy.   Will defer to them for further changes to plan of care.       Relevant Orders   CMP14+EGFR (Completed)   TSH (Completed)   CBC with Diff (Completed)     Other   B12 deficiency   Checking labs today.  Will continue supplements as needed.        Relevant Orders   CMP14+EGFR (Completed)   TSH (Completed)   Vitamin B12 (Completed)   CBC with Diff (Completed)   Flank pain   UA in office.  Will send for UA at [facility] and will call with results when available.       Iron deficiency anemia   Checking labs today.  Will continue supplements as needed.        Relevant Orders   CMP14+EGFR (Completed)   TSH (Completed)   CBC with Diff (Completed)   Vitamin D  deficiency, unspecified   Checking labs today.  Will continue supplements as needed.       Relevant Orders   VITAMIN D  25 Hydroxy (Vit-D Deficiency, Fractures) (Completed)   CMP14+EGFR (Completed)   TSH (Completed)   CBC with Diff (Completed)   Mixed stress and urge urinary incontinence   Sending orders for patient for incontinence supplies.  She needs briefs, and may also benefit from wipes and underpads as well.  These supplies are necessary for patient to prevent skin breakdown and deterioration of her condition.       Other Visit Diagnoses       Low back pain, unspecified back pain laterality, unspecified chronicity, unspecified whether sciatica present    -  Primary   Relevant Orders   POCT Urinalysis Dipstick (81002) (Completed)   Urinalysis, Routine w reflex microscopic (Completed)   Urine Culture (Completed)   CMP14+EGFR (Completed)   TSH (Completed)   CBC with Diff (Completed)   Microscopic Examination (Completed)       Return in about 3 months (around 02/14/2024).   Total time spent: 30 minutes  Trenda Frisk, FNP  11/14/2023   This document may have been prepared by [facility] Voice  Recognition software and as such may include unintentional dictation errors.

## 2024-01-14 NOTE — Assessment & Plan Note (Signed)
 Sending orders for patient for incontinence supplies.  She needs briefs, and may also benefit from wipes and underpads as well.  These supplies are necessary for patient to prevent skin breakdown and deterioration of her condition.

## 2024-01-14 NOTE — Assessment & Plan Note (Signed)
 Checking labs today.  Continue current therapy for lipid control. Will modify as needed based on labwork results.

## 2024-01-14 NOTE — Assessment & Plan Note (Signed)
 Blood pressure well controlled with current medications.  Continue current therapy.  Will reassess at follow up.

## 2024-01-14 NOTE — Assessment & Plan Note (Signed)
 UA in office.  Will send for UA at [facility] and will call with results when available.

## 2024-01-25 ENCOUNTER — Encounter: Payer: Self-pay | Admitting: Pulmonary Disease

## 2024-01-25 ENCOUNTER — Ambulatory Visit: Payer: Self-pay | Admitting: Pulmonary Disease

## 2024-01-25 ENCOUNTER — Other Ambulatory Visit
Admission: RE | Admit: 2024-01-25 | Discharge: 2024-01-25 | Disposition: A | Discharge status: Home / Self Care | Source: Ambulatory Visit | Attending: Pulmonary Disease | Admitting: Pulmonary Disease

## 2024-01-25 VITALS — BP 112/66 | HR 95 | Ht 63.0 in | Wt 170.0 lb

## 2024-01-25 DIAGNOSIS — R0602 Shortness of breath: Secondary | ICD-10-CM | POA: Diagnosis not present

## 2024-01-25 DIAGNOSIS — D869 Sarcoidosis, unspecified: Secondary | ICD-10-CM | POA: Diagnosis present

## 2024-01-25 DIAGNOSIS — N183 Chronic kidney disease, stage 3 unspecified: Secondary | ICD-10-CM

## 2024-01-25 DIAGNOSIS — E1140 Type 2 diabetes mellitus with diabetic neuropathy, unspecified: Secondary | ICD-10-CM | POA: Diagnosis not present

## 2024-01-25 DIAGNOSIS — Z794 Long term (current) use of insulin: Secondary | ICD-10-CM

## 2024-01-25 LAB — NITRIC OXIDE: Nitric Oxide: 30

## 2024-01-25 LAB — CBC WITH DIFFERENTIAL/PLATELET
Abs Immature Granulocytes: 0.01 10*3/uL (ref 0.00–0.07)
Basophils Absolute: 0 10*3/uL (ref 0.0–0.1)
Basophils Relative: 0 %
Eosinophils Absolute: 0.1 10*3/uL (ref 0.0–0.5)
Eosinophils Relative: 2 %
HCT: 40.9 % (ref 36.0–46.0)
Hemoglobin: 13.4 g/dL (ref 12.0–15.0)
Immature Granulocytes: 0 %
Lymphocytes Relative: 33 %
Lymphs Abs: 2 10*3/uL (ref 0.7–4.0)
MCH: 30.1 pg (ref 26.0–34.0)
MCHC: 32.8 g/dL (ref 30.0–36.0)
MCV: 91.9 fL (ref 80.0–100.0)
Monocytes Absolute: 0.5 10*3/uL (ref 0.1–1.0)
Monocytes Relative: 8 %
Neutro Abs: 3.4 10*3/uL (ref 1.7–7.7)
Neutrophils Relative %: 57 %
Platelets: 204 10*3/uL (ref 150–400)
RBC: 4.45 MIL/uL (ref 3.87–5.11)
RDW: 13.8 % (ref 11.5–15.5)
WBC: 6.1 10*3/uL (ref 4.0–10.5)
nRBC: 0 % (ref 0.0–0.2)

## 2024-01-25 LAB — RENAL FUNCTION PANEL
Albumin: 3.6 g/dL (ref 3.5–5.0)
Anion gap: 8 (ref 5–15)
BUN: 17 mg/dL (ref 8–23)
CO2: 27 mmol/L (ref 22–32)
Calcium: 9.1 mg/dL (ref 8.9–10.3)
Chloride: 104 mmol/L (ref 98–111)
Creatinine, Ser: 1.2 mg/dL — ABNORMAL HIGH (ref 0.44–1.00)
GFR, Estimated: 51 mL/min — ABNORMAL LOW (ref >60)
Glucose, Bld: 208 mg/dL — ABNORMAL HIGH (ref 70–99)
Phosphorus: 2.9 mg/dL (ref 2.5–4.6)
Potassium: 5.2 mmol/L — ABNORMAL HIGH (ref 3.5–5.1)
Sodium: 139 mmol/L (ref 135–145)

## 2024-01-25 NOTE — Progress Notes (Signed)
 Subjective:    Patient ID: Renee Bush, female    DOB: 02/21/1961, 63 y.o.   MRN: [id number]  Patient Care Team: Trenda Frisk, FNP as PCP - General (Family Medicine) End, Veryl Gottron, MD as PCP - Cardiology (Cardiology) Marc Senior, MD as Consulting Physician (Pulmonary Disease)  Chief Complaint  Patient presents with   Follow-up    Breathing is overall doing well and no new respiratory co's.     BACKGROUND/INTERVAL:Renee Bush is a 63 year old remote former smoker (minimal smoking exposure) who presents for follow-up on the issue of sarcoidosis. This is a scheduled visit.  She was last seen here on 30 May 2023.  Recall that methotrexate  was started on 01 June 2022 due to evidence of increased retroperitoneal and porta hepatic lymph node involvement. The patient was initially seen by me on 12 October 2021 for abnormal chest CT with mediastinal and hilar adenopathy.  This was hypermetabolic on PET/CT.  A lymph node biopsy of the right supraclavicular area ensued and it was consistent with sarcoidosis. Initially she was treated with low-dose steroids however, she has significant diabetes and has had issues with diabetic ketoacidosis so steroids are being avoided.  She has been taking 7.5 mg of methotrexate  once a week on Sundays, as noted this was started on 01 June 2022.  She has been tolerating this medication.  She was started at the lower dose due to mild AKI noted.  Repeat basic metabolic panel performed by primary care on 19 April 2023 shows that her renal function on this medication is stable.  Of note, the patient also had hypercalcemia that resolved on the methotrexate .  After her visit of October she was able to come off of methotrexate  has a CT chest abdomen and pelvis showed no evidence of active sarcoid.  HPI Discussed the use of AI scribe software for clinical note transcription with the patient, who gave verbal consent to proceed.  History of Present  Illness   Renee Bush is a 63 year old female with sarcoidosis who presents for follow-up.  She has experienced increased use of her inhalers, particularly during activities such as cleaning. She frequently uses her rescue inhaler and is on Symbicort  as a maintenance inhaler. Despite this, her symptoms have persisted.  She does not elaborate any further on any other symptomatology with regards to her respiratory status.  She has not had any fevers, chills or sweats.  No cough or sputum production.  She has a history of asthma, which contributes to her respiratory symptoms. She continues to use Symbicort  without issues and uses albuterol  as needed.  She had been on methotrexate  for lymphadenopathy due to sarcoidosis however, was able to come off the medication in October 2024.  She is following up with her nephrologist as she has stage III kidney disease.      TEST/EVENTS  October 04, 2021 Right supraclavicular lymph node biopsy showed nonnecrotizing granulomatous inflammation  September 22, 2021 CT chest negative for PE, mediastinal and right hilar adenopathy small nodes are present at the porta hepatis,  September 29, 2021 PET scan showed hypermetabolic lymphadenopathy in both hilar regions in the mediastinal and associated hypermetabolic lymph nodes in the right supraclavicular region and left thoracic inlet upper portal lymph nodes in the abdomen show low-level hypermetabolism October 29, 2021 2D echo showed EF at 60-65%, grade 1 diastolic dysfunction, normal pulmonary artery systolic pressure, normal right ventricular size.  October 28, 2021 PFTs showed normal lung function with mild bronchodilator response  FEV1 101%, ratio 82, FVC 95%, 10% bronchodilator change, DLCO 104%. February 08, 2022 CT chest: Prominent/enlarged thoracic lymph nodes creased in size from prior exam there is porta hepatic lymph node within upper abdomen which has increased in size from prior exams findings are nonspecific and may  be related to the history of sarcoid.  4 mm left solid pulmonary nodule. 13 April 2022 CT abdomen and pelvis: Increased size of the retroperitoneal and porta hepatic lymph nodes of the upper abdomen. 01 June 2022 ACE level:133 U/L (Ref.14-82 U/L) 01 June 2022: Methotrexate  initiated, 7.5 mg weekly 30 August 2022 ACE level: 84 U/L 25 November 2022 ACE level: 79 U/L 05 Jan 2023 ACE level: 80 U/L 12 June 2023 CT chest abdomen and pelvis with contrast: No CT evidence of active sarcoidosis in the chest abdomen and pelvis.  Decreased size of the thoracic abdominal and pelvic lymph nodes.  Solid left lower lobe pulmonary nodule measures 3 mm previously 4 mm, favored benign.  Pleural-parenchymal scarring in the peripheral right middle lobe lingula and paramedian right lower lobe.    Review of Systems A 10 point review of systems was performed and it is as noted above otherwise negative.   Patient Active Problem List   Diagnosis Date Noted   Mixed stress and urge urinary incontinence 01/14/2024   Vitamin D  deficiency, unspecified 11/12/2023   Coronary artery disease involving native coronary artery of native heart without angina pectoris 03/01/2023   Hyperkalemia 03/01/2023   Iron deficiency anemia 10/10/2022   Moderate episode of recurrent major depressive disorder (HCC) 10/10/2022   Sarcoidosis 11/12/2021   PSVT (paroxysmal supraventricular tachycardia) (HCC) 10/28/2021   Acute non-recurrent pansinusitis 08/15/2019   Bilateral edema of lower extremity 05/16/2019   Generalized anxiety disorder 02/07/2019   Flank pain 11/09/2017   Insomnia 10/06/2017   GERD (gastroesophageal reflux disease) 10/06/2017   Chronic kidney disease, stage III (moderate) (HCC) 10/06/2017   B12 deficiency 06/09/2016   Hyperlipidemia associated with type 2 diabetes mellitus (HCC) 05/05/2016   Type 2 diabetes mellitus with diabetic neuropathy, with long-term current use of insulin  (HCC) 09/10/2015   Essential  hypertension 09/10/2015    Social History   Tobacco Use   Smoking status: Former    Current packs/day: 0.00    Average packs/day: 1 pack/day for 5.0 years (5.0 ttl pk-yrs)    Types: Cigarettes    Start date: 04/17/2000    Quit date: 04/17/2005    Years since quitting: 18.7   Smokeless tobacco: Never  Substance Use Topics   Alcohol use: Not Currently    Alcohol/week: 14.0 standard drinks of alcohol    Types: 14 Cans of beer per week    Comment: quit ~2020    Allergies  Allergen Reactions   Etodolac Rash   Penicillin G Rash   Penicillins Rash    Has patient had a PCN reaction causing immediate rash, facial/tongue/throat swelling, SOB or lightheadedness with hypotension: Yes  Has patient had a PCN reaction causing severe rash involving mucus membranes or skin necrosis: No  Has patient had a PCN reaction that required hospitalization: No  Has patient had a PCN reaction occurring within the last 10 years: No  If all of the above answers are "NO", then may proceed with Cephalosporin use.  Has patient had a PCN reaction causing immediate rash, facial/tongue/throat swelling, SOB or lightheadedness with hypotension: Yes Has patient had a PCN reaction causing severe rash involving mucus membranes or skin necrosis: No Has patient had a PCN  reaction that required hospitalization: No Has patient had a PCN reaction occurring within the last 10 years: No If all of the above answers are "NO", then may proceed with Cephalosporin use.    Current Meds  Medication Sig   ACCRUFER  30 MG CAPS TAKE 1 CAPSULE(30 MG) BY MOUTH TWICE DAILY   albuterol  (PROVENTIL  HFA) 108 (90 Base) MCG/ACT inhaler Inhale 2 puffs into the lungs every 6 (six) hours as needed for wheezing or shortness of breath.   Blood Glucose Monitoring Suppl (ONETOUCH VERIO) w/Device KIT Use to check glucose up to twice daily   cetirizine  (ZYRTEC ) 10 MG tablet Take 1 tablet (10 mg total) by mouth daily.   diclofenac  (VOLTAREN ) 75 MG EC  tablet Take 1 tablet (75 mg total) by mouth 2 (two) times daily.   DULoxetine  (CYMBALTA ) 60 MG capsule Take 1 capsule (60 mg total) by mouth 2 (two) times daily.   fexofenadine  (ALLEGRA ) 180 MG tablet Take 1 tablet (180 mg total) by mouth daily.   Finerenone  (KERENDIA ) 10 MG TABS Take 1 tablet (10 mg total) by mouth daily.   gabapentin  (NEURONTIN ) 400 MG capsule TAKE 1 CAPSULE BY MOUTH TWICE DAILY AND 2 CAPSULES BY MOUTH AT BEDTIME   insulin  glargine (LANTUS  SOLOSTAR) 100 UNIT/ML Solostar Pen Inject 17 Units into the skin at bedtime.   insulin  lispro (HUMALOG ) 100 UNIT/ML injection INJECT 2 UNITS UNDER THE SKIN THREE TIMES DAILY BEFORE MEALS SLIDING SCALE MAX 30 UNITS PER DAY DISCARD VIAL AFTER 28 DAYS ONCE PUNCTURED   Insulin  Pen Needle 32G X 6 MM MISC Use as directed.   Insulin  Syringe-Needle U-100 (INSULIN  SYRINGE .5CC/31GX5/16") 31G X 5/16" 0.5 ML MISC 1 each by Does not apply route 3 (three) times daily.   Magnesium  Oxide 400 MG CAPS Take 1 capsule (400 mg total) by mouth daily in the afternoon.   metFORMIN  (GLUCOPHAGE ) 500 MG tablet TAKE 1 TABLET(500 MG) BY MOUTH TWICE DAILY   metoprolol  tartrate (LOPRESSOR ) 25 MG tablet Take 2 tablets (50 mg total) by mouth 2 (two) times daily.   montelukast  (SINGULAIR ) 10 MG tablet Take 1 tablet (10 mg total) by mouth daily.   Omega-3 Fatty Acids (FISH OIL) 1000 MG CAPS Take 1,000 mg by mouth 2 (two) times daily.   omeprazole  (PRILOSEC) 20 MG capsule Take 1 capsule (20 mg total) by mouth 2 (two) times daily.   rosuvastatin  (CRESTOR ) 5 MG tablet TAKE 1 TABLET BY MOUTH DAILY   SYMBICORT  160-4.5 MCG/ACT inhaler Inhale 2 puffs into the lungs 2 (two) times daily. Rinse your mouth well after use.   tirzepatide (MOUNJARO) 5 MG/0.5ML Pen Inject 5 mg into the skin once a week.   XIIDRA 5 % SOLN Apply 1 drop to eye 2 (two) times daily.    Immunization History  Administered Date(s) Administered   Influenza, Seasonal, Injecte, Preservative Fre 05/30/2023    Influenza,inj,Quad PF,6+ Mos 05/15/2019, 06/01/2022   Influenza-Unspecified 06/04/2015, 06/10/2016, 06/08/2017, 05/25/2018, 06/01/2020, 05/20/2021   PFIZER(Purple Top)SARS-COV-2 Vaccination 12/02/2019, 12/23/2019   Pneumococcal Polysaccharide-23 05/16/2019   Tdap 06/04/2015, 05/05/2023        Objective:     BP 112/66 (BP Location: Right Arm, Cuff Size: Normal)   Pulse 95   Ht 5\' 3"  (1.6 m)   Wt 170 lb (77.1 kg)   SpO2 97%   BMI 30.11 kg/m   SpO2: 97 % O2 Device: None (Room air)  GENERAL: Obese, well-developed woman, no acute distress.  Fully ambulatory, no conversational dyspnea. HEAD: Normocephalic, atraumatic. EYES: Pupils  equal, round, reactive to light.  No scleral icterus. MOUTH: Poor dentition, oral mucosa moist.  No thrush. NECK: Supple. No thyromegaly. Trachea midline. No JVD.  No adenopathy. PULMONARY: Good air entry bilaterally.  No adventitious sounds. CARDIOVASCULAR: S1 and S2. Regular rate and rhythm.  No rubs, murmurs or gallops heard. ABDOMEN: Obese otherwise benign. MUSCULOSKELETAL: No joint deformity, no clubbing, no edema.  NEUROLOGIC: No overt focal deficit, no gait disturbance, speech is fluent. SKIN: Intact,warm,dry.  PSYCH: Mood and behavior normal.  Lab Results  Component Value Date   NITRICOXIDE 30 01/25/2024  *Type II inflammation in indeterminate range       Assessment & Plan:     ICD-10-CM   1. Shortness of breath  R06.02 Nitric oxide    Pulmonary function test    ECHOCARDIOGRAM COMPLETE    2. Sarcoidosis  D86.9 Pulmonary function test    ECHOCARDIOGRAM COMPLETE    Angiotensin converting enzyme    Renal Function Panel    CBC with Differential/Platelet    3. Stage 3 chronic kidney disease, unspecified whether stage 3a or 3b CKD (HCC)  N18.30 Renal Function Panel    4. Type 2 diabetes mellitus with diabetic neuropathy, with long-term current use of insulin  (HCC)  E11.40    Z79.4       Orders Placed This Encounter  Procedures    Angiotensin converting enzyme    Standing Status:   Future    Expiration Date:   01/24/2025   Renal Function Panel    Standing Status:   Future    Expiration Date:   01/24/2025   CBC with Differential/Platelet    Standing Status:   Future    Expected Date:   01/25/2024    Expiration Date:   01/24/2025   Nitric oxide   ECHOCARDIOGRAM COMPLETE    Standing Status:   Future    Expected Date:   02/25/2024    Expiration Date:   01/24/2025    Where should this test be performed:   [facility]    Perflutren DEFINITY (image enhancing agent) should be administered unless hypersensitivity or allergy exist:   Administer Perflutren    Reason for exam-Echo:   Dyspnea  R06.00    Reason for exam-Echo:   Cardiac Sarcoidosis D86.85   Pulmonary function test    Standing Status:   Future    Expected Date:   02/08/2024    Expiration Date:   01/24/2025    Where should this test be performed?:   Outpatient Pulmonary    What type of PFT is being ordered?:   Full PFT   Discussion:    Sarcoidosis Sarcoidosis is well-controlled based on the last CT scan from October, which showed no marked decrease in lymphadenopathy. There is concern for potential reactivation, particularly cardiac involvement given her recent issues with dyspnea.  If she requires retreatment we will consider Plaquenil due to renal concerns with the trach site and glucose control concerns with prednisone . - Order blood work: CBC, c-Met, ACE level - Repeat echocardiogram to assess cardiac involvement - Consider switching to Plaquenil for sarcoidosis management - Follow up with nephrologist for renal monitoring  Asthma Asthma symptoms persist with increased use of rescue inhaler during activities, indicating poor control. Current use of Symbicort  is effective. - Continue Symbicort  - Use albuterol  as needed - Perform pulmonary function tests to reassess     Advised if symptoms do not improve or worsen, to please contact office for  sooner follow up or seek  emergency care.    I spent 40 minutes of dedicated to the care of this patient on the date of this encounter to include pre-visit review of records, face-to-face time with the patient discussing conditions above, post visit ordering of testing, clinical documentation with the electronic health record, making appropriate referrals as documented, and communicating necessary findings to members of the patients care team.  C. Chloe Counter, MD Advanced Bronchoscopy P[facility]    *This note was generated using voice recognition software/Dragon and/or AI transcription program.  Despite best efforts to proofread, errors can occur which can change the meaning. Any transcriptional errors that result from this process are unintentional and may not be fully corrected at the time of dictation.

## 2024-01-25 NOTE — Patient Instructions (Signed)
 VISIT SUMMARY:  You came in today for a follow-up visit regarding your sarcoidosis and asthma. You mentioned increased use of your inhalers, especially during activities like cleaning. We discussed your current medications and symptoms, and planned some adjustments and tests to better manage your conditions.  YOUR PLAN:  -SARCOIDOSIS: Sarcoidosis is a condition where inflammatory cells grow in different parts of your body, often affecting the lungs. Your sarcoidosis appears well-controlled based on your last CT scan, but we are concerned about potential reactivation, especially in your heart. We will order blood work and repeat an echocardiogram to check for any cardiac involvement. We are also considering switching your medication from methotrexate  to Plaquenil due to concerns about your kidneys. Please continue to follow up with your kidney doctor for ongoing monitoring.  -ASTHMA: Asthma is a condition that causes your airways to narrow and swell, leading to difficulty breathing. Your asthma symptoms have been persistent, with increased use of your rescue inhaler during activities. We recommend continuing your current use of Symbicort  and using albuterol  as needed. We will also perform pulmonary function tests to better assess your lung function.  INSTRUCTIONS:  Please complete the blood work and echocardiogram as ordered. Continue using Symbicort  and albuterol  as directed. Follow up with your nephrologist for renal monitoring. We will also schedule pulmonary function tests to evaluate your asthma. Make sure to attend all follow-up appointments to ensure proper management of your conditions.

## 2024-01-26 LAB — ANGIOTENSIN CONVERTING ENZYME: Angiotensin-Converting Enzyme: 66 U/L (ref 14–82)

## 2024-02-01 ENCOUNTER — Ambulatory Visit: Payer: Self-pay | Admitting: Pulmonary Disease

## 2024-02-04 ENCOUNTER — Other Ambulatory Visit: Payer: Self-pay | Admitting: Family

## 2024-02-07 ENCOUNTER — Ambulatory Visit: Attending: Pulmonary Disease

## 2024-02-07 DIAGNOSIS — D869 Sarcoidosis, unspecified: Secondary | ICD-10-CM | POA: Insufficient documentation

## 2024-02-07 DIAGNOSIS — R0602 Shortness of breath: Secondary | ICD-10-CM | POA: Diagnosis not present

## 2024-02-07 LAB — ECHOCARDIOGRAM COMPLETE
AR max vel: 2.16 cm2
AV Area VTI: 2.46 cm2
AV Area mean vel: 2.26 cm2
AV Mean grad: 2 mmHg
AV Peak grad: 3.6 mmHg
Ao pk vel: 0.95 m/s
Area-P 1/2: 3.17 cm2
Calc EF: 70.1 %
S' Lateral: 2.1 cm
Single Plane A2C EF: 74.5 %
Single Plane A4C EF: 67.5 %

## 2024-02-14 ENCOUNTER — Encounter: Payer: Self-pay | Admitting: Family

## 2024-02-14 ENCOUNTER — Ambulatory Visit (INDEPENDENT_AMBULATORY_CARE_PROVIDER_SITE_OTHER): Admitting: Family

## 2024-02-14 VITALS — BP 118/78 | HR 77 | Ht 63.0 in | Wt 166.8 lb

## 2024-02-14 DIAGNOSIS — Z1231 Encounter for screening mammogram for malignant neoplasm of breast: Secondary | ICD-10-CM | POA: Diagnosis not present

## 2024-02-14 DIAGNOSIS — E538 Deficiency of other specified B group vitamins: Secondary | ICD-10-CM

## 2024-02-14 DIAGNOSIS — Z794 Long term (current) use of insulin: Secondary | ICD-10-CM

## 2024-02-14 DIAGNOSIS — E1140 Type 2 diabetes mellitus with diabetic neuropathy, unspecified: Secondary | ICD-10-CM

## 2024-02-14 DIAGNOSIS — E785 Hyperlipidemia, unspecified: Secondary | ICD-10-CM

## 2024-02-14 DIAGNOSIS — E1169 Type 2 diabetes mellitus with other specified complication: Secondary | ICD-10-CM

## 2024-02-14 DIAGNOSIS — I10 Essential (primary) hypertension: Secondary | ICD-10-CM

## 2024-02-14 DIAGNOSIS — E559 Vitamin D deficiency, unspecified: Secondary | ICD-10-CM

## 2024-02-14 DIAGNOSIS — N1831 Chronic kidney disease, stage 3a: Secondary | ICD-10-CM

## 2024-02-14 LAB — POC CREATINE & ALBUMIN,URINE
Creatinine, POC: 100 mg/dL
Microalbumin Ur, POC: 80 mg/L

## 2024-02-14 LAB — POCT CBG (FASTING - GLUCOSE)-MANUAL ENTRY: Glucose Fasting, POC: 123 mg/dL — AB (ref 70–99)

## 2024-02-14 MED ORDER — MOUNJARO 7.5 MG/0.5ML ~~LOC~~ SOAJ: 7.5000 mg | SUBCUTANEOUS | 2 refills | Status: DC

## 2024-02-14 MED ORDER — ALBUTEROL SULFATE HFA 108 (90 BASE) MCG/ACT IN AERS: 2.0000 | INHALATION_SPRAY | Freq: Four times a day (QID) | RESPIRATORY_TRACT | 3 refills | Status: AC | PRN

## 2024-02-14 MED ORDER — CETIRIZINE HCL 10 MG PO TABS: 10.0000 mg | ORAL_TABLET | Freq: Every day | ORAL | 3 refills | Status: DC

## 2024-02-14 NOTE — Assessment & Plan Note (Signed)
 Checking labs today.  Will continue supplements as needed.

## 2024-02-14 NOTE — Progress Notes (Signed)
 Established Patient Office Visit  Subjective:  Patient ID: Renee Bush, female    DOB: 07-Nov-1960  Age: 63 y.o. MRN: [id number]  Chief Complaint  Patient presents with   Follow-up    3 month follow up    Patient is here today for her 3 months follow up.  She has been feeling well since last appointment.   She does not have additional concerns to discuss today.  Labs are due today. She needs refills.   I have reviewed her active problem list, medication list, allergies, health maintenance, notes from last encounter, lab results for her appointment today.  She is due for her mammogram and kidney screening today.      No other concerns at this time.   Past Medical History:  Diagnosis Date   AKI (acute kidney injury) (HCC) 07/07/2022   Antibiotic-induced yeast infection 01/09/2020   Balance problem 12/18/2019   Blood in stool 12/28/2021   Chest pain 10/14/2021   Chronic kidney disease, stage III (moderate) (HCC) 10/06/2017   Constipation 07/13/2021   Diabetes mellitus without complication (HCC)    DKA, type 2 (HCC) 04/16/2022   ETOH abuse 04/18/2015   Family history of colonic polyps    GERD (gastroesophageal reflux disease)    History of allergy 06/11/2019   Hospital discharge follow-up 01/06/2022   Hypercholesteremia    Hypertension 09/10/2015   Hypokalemia 04/17/2022   Palpitations 10/28/2021   Persistent dry cough 11/16/2021   Sarcoidosis 10/12/2021   self-reported   Shortness of breath 10/28/2021   Urinary frequency 07/02/2020   Urinary tract infection 11/16/2021    Past Surgical History:  Procedure Laterality Date   APPENDECTOMY     CESAREAN SECTION  1991   COLONOSCOPY WITH PROPOFOL  N/A 06/03/2019   Procedure: COLONOSCOPY WITH PROPOFOL ;  Surgeon: Luke Salaam, MD;  Location: [facility] ENDOSCOPY;  Service: Gastroenterology;  Laterality: N/A;    Social History   Socioeconomic History   Marital status: Married    Spouse name: Joanette Moynahan   Number of  children: 4   Years of education: Not on file   Highest education level: 11th grade  Occupational History   Occupation: na  Tobacco Use   Smoking status: Former    Current packs/day: 0.00    Average packs/day: 1 pack/day for 5.0 years (5.0 ttl pk-yrs)    Types: Cigarettes    Start date: 04/17/2000    Quit date: 04/17/2005    Years since quitting: 18.8   Smokeless tobacco: Never  Vaping Use   Vaping status: Never Used  Substance and Sexual Activity   Alcohol use: Not Currently    Alcohol/week: 14.0 standard drinks of alcohol    Types: 14 Cans of beer per week    Comment: quit ~2020   Drug use: No   Sexual activity: Not Currently  Other Topics Concern   Not on file  Social History Narrative   Lives in mobile home paid for   Social Drivers of Health   Financial Resource Strain: Low Risk  (05/19/2020)   Overall Financial Resource Strain (CARDIA)    Difficulty of Paying Living Expenses: Not hard at all  Food Insecurity: No Food Insecurity (05/11/2021)   Hunger Vital Sign    Worried About Running Out of Food in the Last Year: Never true    Ran Out of Food in the Last Year: Never true  Transportation Needs: No Transportation Needs (05/11/2021)   PRAPARE - Administrator, Civil Service (Medical): No  Lack of Transportation (Non-Medical): No  Physical Activity: Insufficiently Active (05/19/2020)   Exercise Vital Sign    Days of Exercise per Week: 7 days    Minutes of Exercise per Session: 10 min  Stress: Stress Concern Present (05/28/2020)   [employer] of Occupational Health - Occupational Stress Questionnaire    Feeling of Stress : To some extent  Social Connections: Moderately Isolated (05/28/2020)   Social Connection and Isolation Panel    Frequency of Communication with Friends and Family: More than three times a week    Frequency of Social Gatherings with Friends and Family: More than three times a week    Attends Religious Services: Never    Loss adjuster, chartered or Organizations: No    Attends Banker Meetings: Never    Marital Status: Married  Catering manager Violence: Not At Risk (05/19/2020)   Humiliation, Afraid, Rape, and Kick questionnaire    Fear of Current or Ex-Partner: No    Emotionally Abused: No    Physically Abused: No    Sexually Abused: No    Family History  Problem Relation Age of Onset   COPD Mother    Alzheimer's disease Mother    Hypertension Father    Hyperlipidemia Father    Heart attack Father    Diabetes type II Father    Lupus Father    Diabetes type II Sister    Diabetes type II Sister    Diabetes type II Sister    Diabetes type II Sister    Gestational diabetes Daughter     Allergies  Allergen Reactions   Etodolac Rash   Penicillin G Rash   Penicillins Rash    Has patient had a PCN reaction causing immediate rash, facial/tongue/throat swelling, SOB or lightheadedness with hypotension: Yes  Has patient had a PCN reaction causing severe rash involving mucus membranes or skin necrosis: No  Has patient had a PCN reaction that required hospitalization: No  Has patient had a PCN reaction occurring within the last 10 years: No  If all of the above answers are NO, then may proceed with Cephalosporin use.  Has patient had a PCN reaction causing immediate rash, facial/tongue/throat swelling, SOB or lightheadedness with hypotension: Yes Has patient had a PCN reaction causing severe rash involving mucus membranes or skin necrosis: No Has patient had a PCN reaction that required hospitalization: No Has patient had a PCN reaction occurring within the last 10 years: No If all of the above answers are NO, then may proceed with Cephalosporin use.    Review of Systems  All other systems reviewed and are negative.      Objective:   BP 118/78   Pulse 77   Ht 5' 3 (1.6 m)   Wt 166 lb 12.8 oz (75.7 kg)   SpO2 99%   BMI 29.55 kg/m   Vitals:   02/14/24 1100  BP: 118/78  Pulse: 77   Height: 5' 3 (1.6 m)  Weight: 166 lb 12.8 oz (75.7 kg)  SpO2: 99%  BMI (Calculated): 29.55    Physical Exam Vitals and nursing note reviewed.  Constitutional:      Appearance: Normal appearance. She is normal weight.  HENT:     Head: Normocephalic.   Eyes:     Extraocular Movements: Extraocular movements intact.     Conjunctiva/sclera: Conjunctivae normal.     Pupils: Pupils are equal, round, and reactive to light.    Cardiovascular:     Rate and Rhythm:  Normal rate.  Pulmonary:     Effort: Pulmonary effort is normal.   Neurological:     General: No focal deficit present.     Mental Status: She is alert and oriented to person, place, and time. Mental status is at baseline.   Psychiatric:        Mood and Affect: Mood normal.        Behavior: Behavior normal.        Thought Content: Thought content normal.      Results for orders placed or performed in visit on 02/14/24  POCT CBG (Fasting - Glucose)  Result Value Ref Range   Glucose Fasting, POC 123 (A) 70 - 99 mg/dL  POC CREATINE & ALBUMIN,URINE  Result Value Ref Range   Microalbumin Ur, POC 80 mg/L   Creatinine, POC 100 mg/dL   Albumin/Creatinine Ratio, Urine, POC 30-300     Recent Results (from the past 2160 hours)  Nitric oxide      Status: None   Collection Time: 01/25/24  9:19 AM  Result Value Ref Range   Nitric Oxide  30   CBC with Differential/Platelet     Status: None   Collection Time: 01/25/24  9:51 AM  Result Value Ref Range   WBC 6.1 4.0 - 10.5 K/uL   RBC 4.45 3.87 - 5.11 MIL/uL   Hemoglobin 13.4 12.0 - 15.0 g/dL   HCT 96.0 45.4 - 09.8 %   MCV 91.9 80.0 - 100.0 fL   MCH 30.1 26.0 - 34.0 pg   MCHC 32.8 30.0 - 36.0 g/dL   RDW 11.9 14.7 - 82.9 %   Platelets 204 150 - 400 K/uL   nRBC 0.0 0.0 - 0.2 %   Neutrophils Relative % 57 %   Neutro Abs 3.4 1.7 - 7.7 K/uL   Lymphocytes Relative 33 %   Lymphs Abs 2.0 0.7 - 4.0 K/uL   Monocytes Relative 8 %   Monocytes Absolute 0.5 0.1 - 1.0 K/uL    Eosinophils Relative 2 %   Eosinophils Absolute 0.1 0.0 - 0.5 K/uL   Basophils Relative 0 %   Basophils Absolute 0.0 0.0 - 0.1 K/uL   Immature Granulocytes 0 %   Abs Immature Granulocytes 0.01 0.00 - 0.07 K/uL    Comment: Performed at [facility], [address], [place], [place] [address]  Renal Function Panel     Status: Abnormal   Collection Time: 01/25/24  9:51 AM  Result Value Ref Range   Sodium 139 135 - 145 mmol/L   Potassium 5.2 (H) 3.5 - 5.1 mmol/L   Chloride 104 98 - 111 mmol/L   CO2 27 22 - 32 mmol/L   Glucose, Bld 208 (H) 70 - 99 mg/dL    Comment: Glucose reference range applies only to samples taken after fasting for at least 8 hours.   BUN 17 8 - 23 mg/dL   Creatinine, Ser 0.86 (H) 0.44 - 1.00 mg/dL   Calcium  9.1 8.9 - 10.3 mg/dL   Phosphorus 2.9 2.5 - 4.6 mg/dL   Albumin 3.6 3.5 - 5.0 g/dL   GFR, Estimated 51 (L) >60 mL/min    Comment: (NOTE) Calculated using the CKD-EPI Creatinine Equation (2021)    Anion gap 8 5 - 15    Comment: Performed at [facility], [address] [place], [place] [address]  Angiotensin converting enzyme     Status: None   Collection Time: 01/25/24  9:51 AM  Result Value Ref Range   Angiotensin-Converting Enzyme  66 14 - 82 U/L    Comment: (NOTE) Performed At: [facility] [address] [place], [place] [id number] Pearlean Botts MD IO:[id number]   ECHOCARDIOGRAM COMPLETE     Status: None   Collection Time: 02/07/24  1:11 PM  Result Value Ref Range   AR max vel 2.16 cm2   AV Peak grad 3.6 mmHg   Ao pk vel 0.95 m/s   S' Lateral 2.10 cm   Area-P 1/2 3.17 cm2   AV Area VTI 2.46 cm2   AV Mean grad 2.0 mmHg   Single Plane A4C EF 67.5 %   Single Plane A2C EF 74.5 %   Calc EF 70.1 %   AV Area mean vel 2.26 cm2   Est EF 60 - 65%   POCT CBG (Fasting - Glucose)     Status: Abnormal   Collection Time: 02/14/24 11:13 AM  Result Value Ref Range   Glucose Fasting, POC 123 (A) 70 - 99 mg/dL  POC CREATINE &  ALBUMIN,URINE     Status: Abnormal   Collection Time: 02/14/24 11:36 AM  Result Value Ref Range   Microalbumin Ur, POC 80 mg/L   Creatinine, POC 100 mg/dL   Albumin/Creatinine Ratio, Urine, POC 30-300         Assessment & Plan Type 2 diabetes mellitus with diabetic neuropathy, with long-term current use of insulin  (HCC) Checking labs today. Will call pt. With results  Increasing Mounjaro  dose to 7.5 mg.  Will adjust meds if needed based on labs.  Screening mammogram for breast cancer Order sent for mammogram.  Will await results.  Pt. Aware they will call her with results.  Essential hypertension Blood pressure well controlled with current medications.  Continue current therapy.  Will reassess at follow up.  B12 deficiency Vitamin D  deficiency, unspecified Checking labs today.  Will continue supplements as needed.  Hyperlipidemia associated with type 2 diabetes mellitus (HCC) Checking labs today.  Continue current therapy for lipid control. Will modify as needed based on labwork results.  Stage 3a chronic kidney disease (HCC) Patient is seen by Nephrology, who manage this condition.  She is well controlled with current therapy.   Will defer to them for further changes to plan of care.    Return in about 3 months (around 05/16/2024).   Total time spent: 20 minutes  Trenda Frisk, FNP  02/14/2024   This document may have been prepared by [facility] Voice Recognition software and as such may include unintentional dictation errors.

## 2024-02-14 NOTE — Assessment & Plan Note (Signed)
 Checking labs today.  Continue current therapy for lipid control. Will modify as needed based on labwork results.

## 2024-02-14 NOTE — Assessment & Plan Note (Signed)
 Checking labs today. Will call pt. With results  Increasing Mounjaro  dose to 7.5 mg.  Will adjust meds if needed based on labs.

## 2024-02-14 NOTE — Assessment & Plan Note (Signed)
 Patient is seen by Nephrology, who manage this condition.  She is well controlled with current therapy.   Will defer to them for further changes to plan of care.

## 2024-02-14 NOTE — Assessment & Plan Note (Signed)
 Blood pressure well controlled with current medications.  Continue current therapy.  Will reassess at follow up.

## 2024-02-15 LAB — LIPID PANEL
Chol/HDL Ratio: 3.5 ratio (ref 0.0–4.4)
Cholesterol, Total: 128 mg/dL (ref 100–199)
HDL: 37 mg/dL — ABNORMAL LOW (ref >39)
LDL Chol Calc (NIH): 72 mg/dL (ref 0–99)
Triglycerides: 101 mg/dL (ref 0–149)
VLDL Cholesterol Cal: 19 mg/dL (ref 5–40)

## 2024-02-15 LAB — CBC WITH DIFFERENTIAL/PLATELET
Basophils Absolute: 0 10*3/uL (ref 0.0–0.2)
Basos: 0 %
EOS (ABSOLUTE): 0.1 10*3/uL (ref 0.0–0.4)
Eos: 1 %
Hematocrit: 39.5 % (ref 34.0–46.6)
Hemoglobin: 13.1 g/dL (ref 11.1–15.9)
Immature Grans (Abs): 0 10*3/uL (ref 0.0–0.1)
Immature Granulocytes: 0 %
Lymphocytes Absolute: 2.3 10*3/uL (ref 0.7–3.1)
Lymphs: 30 %
MCH: 32 pg (ref 26.6–33.0)
MCHC: 33.2 g/dL (ref 31.5–35.7)
MCV: 96 fL (ref 79–97)
Monocytes Absolute: 0.6 10*3/uL (ref 0.1–0.9)
Monocytes: 7 %
Neutrophils Absolute: 4.6 10*3/uL (ref 1.4–7.0)
Neutrophils: 62 %
Platelets: 205 10*3/uL (ref 150–450)
RBC: 4.1 x10E6/uL (ref 3.77–5.28)
RDW: 13.6 % (ref 11.7–15.4)
WBC: 7.5 10*3/uL (ref 3.4–10.8)

## 2024-02-15 LAB — CMP14+EGFR
ALT: 14 IU/L (ref 0–32)
AST: 23 IU/L (ref 0–40)
Albumin: 3.7 g/dL — ABNORMAL LOW (ref 3.9–4.9)
Alkaline Phosphatase: 59 IU/L (ref 44–121)
BUN/Creatinine Ratio: 8 — ABNORMAL LOW (ref 12–28)
BUN: 10 mg/dL (ref 8–27)
Bilirubin Total: 0.3 mg/dL (ref 0.0–1.2)
CO2: 23 mmol/L (ref 20–29)
Calcium: 9.3 mg/dL (ref 8.7–10.3)
Chloride: 102 mmol/L (ref 96–106)
Creatinine, Ser: 1.22 mg/dL — ABNORMAL HIGH (ref 0.57–1.00)
Globulin, Total: 2.4 g/dL (ref 1.5–4.5)
Glucose: 109 mg/dL — ABNORMAL HIGH (ref 70–99)
Potassium: 4.1 mmol/L (ref 3.5–5.2)
Sodium: 139 mmol/L (ref 134–144)
Total Protein: 6.1 g/dL (ref 6.0–8.5)
eGFR: 50 mL/min/{1.73_m2} — ABNORMAL LOW (ref >59)

## 2024-02-15 LAB — VITAMIN B12: Vitamin B-12: 1842 pg/mL — ABNORMAL HIGH (ref 232–1245)

## 2024-02-15 LAB — HEMOGLOBIN A1C
Est. average glucose Bld gHb Est-mCnc: 148 mg/dL
Hgb A1c MFr Bld: 6.8 % — ABNORMAL HIGH (ref 4.8–5.6)

## 2024-02-15 LAB — TSH: TSH: 1.28 u[IU]/mL (ref 0.450–4.500)

## 2024-02-15 LAB — VITAMIN D 25 HYDROXY (VIT D DEFICIENCY, FRACTURES): Vit D, 25-Hydroxy: 80.4 ng/mL (ref 30.0–100.0)

## 2024-02-27 ENCOUNTER — Telehealth: Payer: Self-pay

## 2024-02-27 NOTE — Telephone Encounter (Signed)
 [facility] Pharmacist is requesting that the patient get a Occupational hygienist, Receiver and Sensor  or [employer] to improve monitoring of glycemic control please advise

## 2024-02-29 ENCOUNTER — Other Ambulatory Visit: Payer: Self-pay

## 2024-02-29 ENCOUNTER — Ambulatory Visit: Payer: Self-pay

## 2024-02-29 MED ORDER — DEXCOM G6 RECEIVER DEVI: 1.0000 | Freq: Every day | 0 refills | Status: AC

## 2024-02-29 MED ORDER — DEXCOM G6 TRANSMITTER MISC: 1.0000 | Freq: Every day | 0 refills | Status: AC

## 2024-02-29 MED ORDER — DEXCOM G6 SENSOR MISC
1.0000 | 0 refills | Status: AC | PRN
Start: 2024-02-29 — End: 2024-05-29

## 2024-02-29 NOTE — Telephone Encounter (Signed)
 Pended to pcp

## 2024-03-13 ENCOUNTER — Telehealth: Payer: Self-pay

## 2024-03-13 NOTE — Telephone Encounter (Signed)
 [employer] faxed over paperwork for a size change in her incontinence supply and wants it faxed back at [phone]

## 2024-03-27 ENCOUNTER — Other Ambulatory Visit
Admission: RE | Admit: 2024-03-27 | Discharge: 2024-03-27 | Disposition: A | Discharge status: Home / Self Care | Source: Ambulatory Visit | Attending: Pulmonary Disease | Admitting: Pulmonary Disease

## 2024-03-27 ENCOUNTER — Ambulatory Visit: Admitting: Pulmonary Disease

## 2024-03-27 ENCOUNTER — Encounter

## 2024-03-27 ENCOUNTER — Encounter: Payer: Self-pay | Admitting: Pulmonary Disease

## 2024-03-27 VITALS — BP 110/58 | HR 82 | Temp 97.5°F | Ht 63.0 in | Wt 157.6 lb

## 2024-03-27 DIAGNOSIS — D869 Sarcoidosis, unspecified: Secondary | ICD-10-CM | POA: Diagnosis present

## 2024-03-27 DIAGNOSIS — N1831 Chronic kidney disease, stage 3a: Secondary | ICD-10-CM | POA: Diagnosis not present

## 2024-03-27 DIAGNOSIS — R0602 Shortness of breath: Secondary | ICD-10-CM

## 2024-03-27 LAB — RENAL FUNCTION PANEL
Albumin: 3.6 g/dL (ref 3.5–5.0)
Anion gap: 9 (ref 5–15)
BUN: 14 mg/dL (ref 8–23)
CO2: 26 mmol/L (ref 22–32)
Calcium: 9.3 mg/dL (ref 8.9–10.3)
Chloride: 104 mmol/L (ref 98–111)
Creatinine, Ser: 1.2 mg/dL — ABNORMAL HIGH (ref 0.44–1.00)
GFR, Estimated: 51 mL/min — ABNORMAL LOW (ref >60)
Glucose, Bld: 77 mg/dL (ref 70–99)
Phosphorus: 3.9 mg/dL (ref 2.5–4.6)
Potassium: 4.2 mmol/L (ref 3.5–5.1)
Sodium: 139 mmol/L (ref 135–145)

## 2024-03-27 NOTE — Patient Instructions (Signed)
 VISIT SUMMARY:  You came in today for a follow-up on your sarcoidosis. You mentioned having more trouble breathing, especially during physical activities, since stopping methotrexate . You are currently using Enbrel and Symbicort , but Symbicort  only helps sometimes. Your previous echocardiogram was normal, and you are also seeing Dr. Dennise for your kidney issues.  YOUR PLAN:  -SARCOIDOSIS: Sarcoidosis is a condition where inflammatory cells grow in different parts of your body, often affecting the lungs. Since stopping methotrexate , your breathing has worsened. We will reschedule your pulmonary function tests and check your ACE levels. If your ACE levels are high, we might start you on Plaquenil. Depending on your pulmonary test results, we may also consider pulmonary rehabilitation.  -CHRONIC KIDNEY DISEASE: Chronic kidney disease means your kidneys are not working as well as they should. Dr. Dennise is managing this condition. We will order a renal panel to check your kidney function.  INSTRUCTIONS:  Please make sure to reschedule your pulmonary function tests. We will also need to check your ACE levels and perform a renal panel to assess your kidney function. Follow up with Dr. Dennise as needed.

## 2024-03-27 NOTE — Progress Notes (Signed)
 Subjective:    Patient ID: Renee Bush, female    DOB: [birth year], 63 y.o.   MRN: [id number]  Patient Care Team: Orlean Alan HERO, FNP as PCP - General (Family Medicine) End, Lonni, MD as PCP - Cardiology (Cardiology) Tamea Dedra CROME, MD as Consulting Physician (Pulmonary Disease)  Chief Complaint  Patient presents with   Follow-up    DOE. No wheezing or cough.     BACKGROUND/INTERVAL:Renee Bush is a 63 year old remote former smoker (minimal smoking exposure) who presents for follow-up on the issue of sarcoidosis. This is a scheduled visit.  She was last seen here on 25 Jan 2024.  Recall that methotrexate  was started on 01 June 2022 due to evidence of increased retroperitoneal and porta hepatic lymph node involvement as well as hypercalcemia. The patient was initially seen by me on 12 October 2021 for abnormal chest CT with mediastinal and hilar adenopathy.  These findings were hypermetabolic on PET/CT.  A lymph node biopsy of the right supraclavicular area ensued and it was consistent with sarcoidosis. Initially she was treated with low-dose steroids however, she has significant diabetes and has had issues with diabetic ketoacidosis so steroids are being avoided.  She has been taking 7.5 mg of methotrexate  once a week on Sundays, as noted this was started on 01 June 2022.  She has been tolerating this medication.  She was started at the lower dose due to mild AKI noted.  Repeat basic metabolic panel performed by primary care on 19 April 2023 shows that her renal function on this medication is stable.  Of note, the patient also had hypercalcemia that resolved on the methotrexate .  After her visit of October 2024 she was able to come off of methotrexate  as a CT chest abdomen and pelvis showed no evidence of active sarcoid.  Today she presents for follow-up.   HPI Discussed the use of AI scribe software for clinical note transcription with the patient, who gave verbal  consent to proceed.  History of Present Illness   Renee Bush is a 63 year old female with sarcoidosis who presents for follow-up of her condition. She is accompanied by her husband, Gaither.  She experiences increased difficulty with breathing, particularly during physical activities such as house chores, since discontinuing methotrexate . She uses Symbicort , noting that Symbicort  provides inconsistent relief.  She uses albuterol  as rescue.  He does have mild persistent asthma as well.  She had an echocardiogram on 11 June that showed only some mild left atrial enlargement but the remainder of the study was normal.  She is under the care of a kidney specialist, Dr. Dennise, and had blood work done approximately a month ago. No recent blood work was performed during her last visit with Dr. Dennise.   She does not endorse any other symptomatology today.  She was supposed to have PFTs today however the lab had to reschedule her study due to the technician being ill.  TEST/EVENTS  October 04, 2021 Right supraclavicular lymph node biopsy showed nonnecrotizing granulomatous inflammation  September 22, 2021 CT chest negative for PE, mediastinal and right hilar adenopathy small nodes are present at the porta hepatis,  September 29, 2021 PET scan showed hypermetabolic lymphadenopathy in both hilar regions in the mediastinal and associated hypermetabolic lymph nodes in the right supraclavicular region and left thoracic inlet upper portal lymph nodes in the abdomen show low-level hypermetabolism October 29, 2021 2D echo showed EF at 60-65%, grade 1 diastolic dysfunction, normal pulmonary artery systolic pressure, normal  right ventricular size.  October 28, 2021 PFTs showed normal lung function with mild bronchodilator response FEV1 101%, ratio 82, FVC 95%, 10% bronchodilator change, DLCO 104%. February 08, 2022 CT chest: Prominent/enlarged thoracic lymph nodes creased in size from prior exam there is porta hepatic lymph  node within upper abdomen which has increased in size from prior exams findings are nonspecific and may be related to the history of sarcoid.  4 mm left solid pulmonary nodule. 13 April 2022 CT abdomen and pelvis: Increased size of the retroperitoneal and porta hepatic lymph nodes of the upper abdomen. 01 June 2022 ACE level:133 U/L (Ref.14-82 U/L) 01 June 2022: Methotrexate  initiated, 7.5 mg weekly 30 August 2022 ACE level: 84 U/L 25 November 2022 ACE level: 79 U/L 05 Jan 2023 ACE level: 80 U/L 12 June 2023 CT chest abdomen and pelvis with contrast: No CT evidence of active sarcoidosis in the chest abdomen and pelvis.  Decreased size of the thoracic abdominal and pelvic lymph nodes.  Solid left lower lobe pulmonary nodule measures 3 mm previously 4 mm, favored benign.  Pleural-parenchymal scarring in the peripheral right middle lobe lingula and paramedian right lower lobe. 07 February 2024 echocardiogram: LVEF 60 to 65%, normal LV function.  No diastolic dysfunction.  RV function normal.  Left atrium mildly dilated.  No valvular abnormalities.  Review of Systems A 10 point review of systems was performed and it is as noted above otherwise negative.   Patient Active Problem List   Diagnosis Date Noted   Mixed stress and urge urinary incontinence 01/14/2024   Vitamin D  deficiency, unspecified 11/12/2023   Coronary artery disease involving native coronary artery of native heart without angina pectoris 03/01/2023   Hyperkalemia 03/01/2023   Iron deficiency anemia 10/10/2022   Moderate episode of recurrent major depressive disorder (HCC) 10/10/2022   Sarcoidosis 11/12/2021   PSVT (paroxysmal supraventricular tachycardia) (HCC) 10/28/2021   Acute non-recurrent pansinusitis 08/15/2019   Bilateral edema of lower extremity 05/16/2019   Generalized anxiety disorder 02/07/2019   Flank pain 11/09/2017   Insomnia 10/06/2017   GERD (gastroesophageal reflux disease) 10/06/2017   Chronic kidney  disease, stage III (moderate) (HCC) 10/06/2017   B12 deficiency 06/09/2016   Hyperlipidemia associated with type 2 diabetes mellitus (HCC) 05/05/2016   Type 2 diabetes mellitus with diabetic neuropathy, with long-term current use of insulin  (HCC) 09/10/2015   Essential hypertension 09/10/2015    Social History   Tobacco Use   Smoking status: Former    Current packs/day: 0.00    Average packs/day: 1 pack/day for 5.0 years (5.0 ttl pk-yrs)    Types: Cigarettes    Start date: 04/17/2000    Quit date: 04/17/2005    Years since quitting: 18.9   Smokeless tobacco: Never  Substance Use Topics   Alcohol use: Not Currently    Alcohol/week: 14.0 standard drinks of alcohol    Types: 14 Cans of beer per week    Comment: quit ~2020    Allergies  Allergen Reactions   Etodolac Rash   Penicillin G Rash   Penicillins Rash    Has patient had a PCN reaction causing immediate rash, facial/tongue/throat swelling, SOB or lightheadedness with hypotension: Yes  Has patient had a PCN reaction causing severe rash involving mucus membranes or skin necrosis: No  Has patient had a PCN reaction that required hospitalization: No  Has patient had a PCN reaction occurring within the last 10 years: No  If all of the above answers are NO, then may proceed  with Cephalosporin use.  Has patient had a PCN reaction causing immediate rash, facial/tongue/throat swelling, SOB or lightheadedness with hypotension: Yes Has patient had a PCN reaction causing severe rash involving mucus membranes or skin necrosis: No Has patient had a PCN reaction that required hospitalization: No Has patient had a PCN reaction occurring within the last 10 years: No If all of the above answers are NO, then may proceed with Cephalosporin use.    Current Meds  Medication Sig   ACCRUFER  30 MG CAPS TAKE 1 CAPSULE(30 MG) BY MOUTH TWICE DAILY   Acetaminophen  500 MG capsule Take by mouth every 6 (six) hours as needed.   albuterol   (PROVENTIL  HFA) 108 (90 Base) MCG/ACT inhaler Inhale 2 puffs into the lungs every 6 (six) hours as needed for wheezing or shortness of breath.   Blood Glucose Monitoring Suppl (ONETOUCH VERIO) w/Device KIT Use to check glucose up to twice daily   cetirizine  (ZYRTEC ) 10 MG tablet Take 1 tablet (10 mg total) by mouth daily.   Continuous Glucose Receiver (DEXCOM G6 RECEIVER) DEVI 1 Device by Does not apply route daily.   Continuous Glucose Sensor (DEXCOM G6 SENSOR) MISC 1 patch by Does not apply route as needed (apply every 10d). Apply one patch every 10 days   Continuous Glucose Transmitter (DEXCOM G6 TRANSMITTER) MISC 1 Device by Does not apply route daily.   cyanocobalamin  (VITAMIN B12) 1000 MCG tablet Take 1,000 mcg by mouth daily.   DULoxetine  (CYMBALTA ) 60 MG capsule Take 1 capsule (60 mg total) by mouth 2 (two) times daily.   fexofenadine  (ALLEGRA ) 180 MG tablet Take 1 tablet (180 mg total) by mouth daily.   Finerenone  (KERENDIA ) 10 MG TABS Take 1 tablet (10 mg total) by mouth daily.   gabapentin  (NEURONTIN ) 400 MG capsule TAKE 1 CAPSULE BY MOUTH TWICE DAILY AND 2 CAPSULES AT BEDTIME AS DIRECTED   insulin  glargine (LANTUS  SOLOSTAR) 100 UNIT/ML Solostar Pen Inject 17 Units into the skin at bedtime.   insulin  lispro (HUMALOG ) 100 UNIT/ML injection INJECT 2 UNITS UNDER THE SKIN THREE TIMES DAILY BEFORE MEALS SLIDING SCALE MAX 30 UNITS PER DAY DISCARD VIAL AFTER 28 DAYS ONCE PUNCTURED   Insulin  Pen Needle 32G X 6 MM MISC Use as directed.   Insulin  Syringe-Needle U-100 (INSULIN  SYRINGE .5CC/31GX5/16) 31G X 5/16 0.5 ML MISC 1 each by Does not apply route 3 (three) times daily.   Magnesium  Oxide 400 MG CAPS Take 1 capsule (400 mg total) by mouth daily in the afternoon.   metFORMIN  (GLUCOPHAGE ) 500 MG tablet TAKE 1 TABLET(500 MG) BY MOUTH TWICE DAILY   metoprolol  tartrate (LOPRESSOR ) 25 MG tablet Take 2 tablets (50 mg total) by mouth 2 (two) times daily.   montelukast  (SINGULAIR ) 10 MG tablet Take 1  tablet (10 mg total) by mouth daily.   Omega-3 Fatty Acids (FISH OIL) 1000 MG CAPS Take 1,000 mg by mouth 2 (two) times daily.   omeprazole  (PRILOSEC) 20 MG capsule Take 1 capsule (20 mg total) by mouth 2 (two) times daily.   rosuvastatin  (CRESTOR ) 5 MG tablet TAKE 1 TABLET BY MOUTH DAILY   SYMBICORT  160-4.5 MCG/ACT inhaler Inhale 2 puffs into the lungs 2 (two) times daily. Rinse your mouth well after use.   tirzepatide  (MOUNJARO ) 7.5 MG/0.5ML Pen Inject 7.5 mg into the skin once a week.   traZODone  (DESYREL ) 50 MG tablet Take 25-50 mg by mouth at bedtime.   XIIDRA 5 % SOLN Apply 1 drop to eye 2 (two) times daily.  Immunization History  Administered Date(s) Administered   Influenza, Seasonal, Injecte, Preservative Fre 05/30/2023   Influenza,inj,Quad PF,6+ Mos 05/15/2019, 06/01/2022   Influenza-Unspecified 06/04/2015, 06/10/2016, 06/08/2017, 05/25/2018, 06/01/2020, 05/20/2021   PFIZER(Purple Top)SARS-COV-2 Vaccination 12/02/2019, 12/23/2019   Pneumococcal Polysaccharide-23 05/16/2019   Tdap 06/04/2015, 05/05/2023        Objective:     BP (!) 110/58 (BP Location: Right Arm, Cuff Size: Normal)   Pulse 82   Temp (!) 97.5 F (36.4 C)   Ht 5' 3 (1.6 m)   Wt 157 lb 9.6 oz (71.5 kg)   SpO2 97%   BMI 27.92 kg/m   SpO2: 97 % O2 Device: None (Room air)  GENERAL: Obese, well-developed woman, no acute distress.  Fully ambulatory, no conversational dyspnea. HEAD: Normocephalic, atraumatic. EYES: Pupils equal, round, reactive to light.  No scleral icterus. MOUTH: Poor dentition, oral mucosa moist.  No thrush. NECK: Supple. No thyromegaly. Trachea midline. No JVD.  No adenopathy. PULMONARY: Good air entry bilaterally.  No adventitious sounds. CARDIOVASCULAR: S1 and S2. Regular rate and rhythm.  No rubs, murmurs or gallops heard. ABDOMEN: Obese otherwise benign. MUSCULOSKELETAL: No joint deformity, no clubbing, no edema.  NEUROLOGIC: No overt focal deficit, no gait disturbance,  speech is fluent. SKIN: Intact,warm,dry.  PSYCH: Mood and behavior normal.   Assessment & Plan:     ICD-10-CM   1. Sarcoidosis  D86.9 Pulmonary function test    Angiotensin converting enzyme    Renal function panel    2. Shortness of breath  R06.02 Pulmonary function test    3. Stage 3a chronic kidney disease (HCC)  N18.31       Orders Placed This Encounter  Procedures   Angiotensin converting enzyme    Standing Status:   Future    Number of Occurrences:   1    Expiration Date:   03/27/2025   Renal function panel    Standing Status:   Future    Number of Occurrences:   1    Expiration Date:   03/27/2025   Discussion:    Sarcoidosis Sarcoidosis with respiratory symptoms exacerbated after discontinuation of methotrexate . Echocardiogram is normal, indicating symptoms are not cardiac in origin. Symbicort  provides intermittent relief. - Reschedule pulmonary function tests - Order ACE level - Consider starting Plaquenil if ACE level is elevated - Consider pulmonary rehabilitation based on pulmonary function test results  Chronic kidney disease Chronic kidney disease managed by nephrologist Dr. Dennise. Recent blood work was done a month ago, but no new tests were performed during the last nephrology visit. - Order renal panel to assess kidney function    Advised if symptoms do not improve or worsen, to please contact office for sooner follow up or seek emergency care.    I spent 40 minutes of dedicated to the care of this patient on the date of this encounter to include pre-visit review of records, face-to-face time with the patient discussing conditions above, post visit ordering of testing, clinical documentation with the electronic health record, making appropriate referrals as documented, and communicating necessary findings to members of the patients care team.     C. Leita Sanders, MD Advanced Bronchoscopy P[facility]    *This note was  generated using voice recognition software/Dragon and/or AI transcription program.  Despite best efforts to proofread, errors can occur which can change the meaning. Any transcriptional errors that result from this process are unintentional and may not be fully corrected at the time of dictation.

## 2024-03-28 LAB — ANGIOTENSIN CONVERTING ENZYME: Angiotensin-Converting Enzyme: 47 U/L (ref 14–82)

## 2024-04-15 ENCOUNTER — Other Ambulatory Visit: Payer: Self-pay | Admitting: Family

## 2024-04-16 ENCOUNTER — Emergency Department

## 2024-04-16 ENCOUNTER — Emergency Department
Admission: EM | Admit: 2024-04-16 | Discharge: 2024-04-16 | Disposition: A | Discharge status: Home / Self Care | Attending: Emergency Medicine | Admitting: Emergency Medicine

## 2024-04-16 ENCOUNTER — Other Ambulatory Visit: Payer: Self-pay

## 2024-04-16 DIAGNOSIS — I10 Essential (primary) hypertension: Secondary | ICD-10-CM | POA: Insufficient documentation

## 2024-04-16 DIAGNOSIS — K2900 Acute gastritis without bleeding: Secondary | ICD-10-CM | POA: Diagnosis not present

## 2024-04-16 DIAGNOSIS — N3000 Acute cystitis without hematuria: Secondary | ICD-10-CM | POA: Insufficient documentation

## 2024-04-16 DIAGNOSIS — I2510 Atherosclerotic heart disease of native coronary artery without angina pectoris: Secondary | ICD-10-CM | POA: Diagnosis not present

## 2024-04-16 DIAGNOSIS — R1010 Upper abdominal pain, unspecified: Secondary | ICD-10-CM | POA: Diagnosis present

## 2024-04-16 DIAGNOSIS — E119 Type 2 diabetes mellitus without complications: Secondary | ICD-10-CM | POA: Diagnosis not present

## 2024-04-16 LAB — COMPREHENSIVE METABOLIC PANEL WITH GFR
ALT: 21 U/L (ref 0–44)
AST: 31 U/L (ref 15–41)
Albumin: 3.9 g/dL (ref 3.5–5.0)
Alkaline Phosphatase: 59 U/L (ref 38–126)
Anion gap: 15 (ref 5–15)
BUN: 21 mg/dL (ref 8–23)
CO2: 20 mmol/L — ABNORMAL LOW (ref 22–32)
Calcium: 10.1 mg/dL (ref 8.9–10.3)
Chloride: 101 mmol/L (ref 98–111)
Creatinine, Ser: 1.28 mg/dL — ABNORMAL HIGH (ref 0.44–1.00)
GFR, Estimated: 47 mL/min — ABNORMAL LOW (ref >60)
Glucose, Bld: 188 mg/dL — ABNORMAL HIGH (ref 70–99)
Potassium: 5.5 mmol/L — ABNORMAL HIGH (ref 3.5–5.1)
Sodium: 136 mmol/L (ref 135–145)
Total Bilirubin: 1.2 mg/dL (ref 0.0–1.2)
Total Protein: 7.3 g/dL (ref 6.5–8.1)

## 2024-04-16 LAB — CBC
HCT: 45.4 % (ref 36.0–46.0)
Hemoglobin: 15.9 g/dL — ABNORMAL HIGH (ref 12.0–15.0)
MCH: 31.4 pg (ref 26.0–34.0)
MCHC: 35 g/dL (ref 30.0–36.0)
MCV: 89.7 fL (ref 80.0–100.0)
Platelets: 249 K/uL (ref 150–400)
RBC: 5.06 MIL/uL (ref 3.87–5.11)
RDW: 12.8 % (ref 11.5–15.5)
WBC: 9.4 K/uL (ref 4.0–10.5)
nRBC: 0 % (ref 0.0–0.2)

## 2024-04-16 LAB — URINALYSIS, ROUTINE W REFLEX MICROSCOPIC
Glucose, UA: NEGATIVE mg/dL
Hgb urine dipstick: NEGATIVE
Ketones, ur: 20 mg/dL — AB
Nitrite: NEGATIVE
Protein, ur: 100 mg/dL — AB
Specific Gravity, Urine: 1.031 — ABNORMAL HIGH (ref 1.005–1.030)
WBC, UA: >50 WBC/hpf (ref 0–5)
pH: 5 (ref 5.0–8.0)

## 2024-04-16 LAB — LIPASE, BLOOD: Lipase: 51 U/L (ref 11–51)

## 2024-04-16 MED ORDER — SULFAMETHOXAZOLE-TRIMETHOPRIM 800-160 MG PO TABS
1.0000 | ORAL_TABLET | Freq: Once | ORAL | Status: AC
  Administered 2024-04-16: 1 via ORAL
  Filled 2024-04-16: qty 1

## 2024-04-16 MED ORDER — IOHEXOL 350 MG/ML SOLN
75.0000 mL | Freq: Once | INTRAVENOUS | Status: AC | PRN
  Administered 2024-04-16: 75 mL via INTRAVENOUS

## 2024-04-16 MED ORDER — SULFAMETHOXAZOLE-TRIMETHOPRIM 800-160 MG PO TABS: 1.0000 | ORAL_TABLET | Freq: Two times a day (BID) | ORAL | 0 refills | Status: DC

## 2024-04-16 MED ORDER — PANTOPRAZOLE SODIUM 20 MG PO TBEC: 20.0000 mg | DELAYED_RELEASE_TABLET | Freq: Every day | ORAL | 0 refills | Status: DC

## 2024-04-16 MED ORDER — SODIUM CHLORIDE 0.9 % IV BOLUS
1000.0000 mL | Freq: Once | INTRAVENOUS | Status: AC
  Administered 2024-04-16: 1000 mL via INTRAVENOUS

## 2024-04-16 NOTE — ED Notes (Signed)
 Pt given ice per request - okay per provider.

## 2024-04-16 NOTE — ED Triage Notes (Signed)
 Pt sts that she has been having abd pain with N/V for the last three days.

## 2024-04-16 NOTE — ED Provider Notes (Signed)
 [facility] Provider Note    Event Date/Time   First MD Initiated Contact with Patient 04/16/24 1834     (approximate)   History   Abdominal Pain   HPI  Renee Bush is a 63 y.o. female with history of type 2 diabetes, hypertension, IDA, CAD and as listed in EMR presents to the emergency department for treatment and evaluation of upper abdominal pain with nausea and vomiting for the last 3 days.  No fever.  She has also had some burning with urination today.     Physical Exam    Vitals:   04/16/24 1835 04/16/24 2120  BP: 122/79 118/84  Pulse: (!) 118 (!) 101  Resp: 18 20  Temp:  98 F (36.7 C)  SpO2: 100% 100%    General: Awake, no distress.  CV:  Good peripheral perfusion.  Resp:  Normal effort.  Abd:  No distention.  No focal abdominal tenderness. Other:     ED Results / Procedures / Treatments   Labs (all labs ordered are listed, but only abnormal results are displayed)  Labs Reviewed  COMPREHENSIVE METABOLIC PANEL WITH GFR - Abnormal; Notable for the following components:      Result Value   Potassium 5.5 (*)    CO2 20 (*)    Glucose, Bld 188 (*)    Creatinine, Ser 1.28 (*)    GFR, Estimated 47 (*)    All other components within normal limits  CBC - Abnormal; Notable for the following components:   Hemoglobin 15.9 (*)    All other components within normal limits  URINALYSIS, ROUTINE W REFLEX MICROSCOPIC - Abnormal; Notable for the following components:   Color, Urine AMBER (*)    APPearance CLOUDY (*)    Specific Gravity, Urine 1.031 (*)    Bilirubin Urine MODERATE (*)    Ketones, ur 20 (*)    Protein, ur 100 (*)    Leukocytes,Ua MODERATE (*)    Bacteria, UA MANY (*)    All other components within normal limits  LIPASE, BLOOD     EKG  Not indicated   RADIOLOGY  Image and radiology report reviewed and interpreted by me. Radiology report consistent with the same.  CT abdomen pelvis with contrast shows  no acute intra-abdominal or pelvic abnormality.  There is slightly thickened appearance of the pylorus potentially due to the gastric contraction or possible gastritis.  PROCEDURES:  Critical Care performed: No  Procedures   MEDICATIONS ORDERED IN ED:  Medications  sodium chloride  0.9 % bolus 1,000 mL (0 mLs Intravenous Stopped 04/16/24 2120)  iohexol  (OMNIPAQUE ) 350 MG/ML injection 75 mL (75 mLs Intravenous Contrast Given 04/16/24 1945)  sulfamethoxazole -trimethoprim  (BACTRIM  DS) 800-160 MG per tablet 1 tablet (1 tablet Oral Given 04/16/24 2039)     IMPRESSION / MDM / ASSESSMENT AND PLAN / ED COURSE   I have reviewed the triage note and vital signs. Vital signs stable, slightly tachycardic   Differential diagnosis includes, but is not limited to, gastritis, viral syndrome, influenza, obstruction.  Acute cystitis, nephrolithiasis, ureterolithiasis,, pyelonephritis.  Patient's presentation is most consistent with acute presentation with potential threat to life or bodily function.  63 year old female presenting to the emergency department for treatment and evaluation of upper abdominal pain with nausea and vomiting in addition to dysuria.  See HPI for further details.  Lab studies show a normal CBC.  CMP shows a mild hyperkalemia at 5.5 which appears to be at patient's baseline based on review of  chart.  Glucose nonfasting is 188, CO2 20, chloride of 101, anion gap of 15, BUN and creatinine are at baseline GFR is 47. Urine is cloudy with moderate bilirubin, ketones, protein, moderate leukocytes, >50 WBC, and many bacteria.  CT abdomen pelvis shows no evidence for acute intraabdominal or pelvic abnormality but does show slightly thickened appearance of pylorus which could be due to vigorous contraction or possible gastritis.  There is also mild diverticular disease on the colon without any acute inflammatory process.  Results reviewed with the patient.  While here she received 1 L of normal  saline and first dose of antibiotic. No vomiting while here.  She has a documented allergy to penicillin with high risk for anaphylaxis therefore no Keflex was ordered.  She will be placed on Bactrim  for her acute cystitis. Protonix  ordered for treatment of gastritis.  Outpatient follow-up and ER return precautions discussed.  Patient discharged in stable condition.      FINAL CLINICAL IMPRESSION(S) / ED DIAGNOSES   Final diagnoses:  Acute gastritis without hemorrhage, unspecified gastritis type  Acute cystitis without hematuria     Rx / DC Orders   ED Discharge Orders          Ordered    sulfamethoxazole -trimethoprim  (BACTRIM  DS) 800-160 MG tablet  2 times daily        04/16/24 2108    pantoprazole  (PROTONIX ) 20 MG tablet  Daily        04/16/24 2108             Note:  This document was prepared using Dragon voice recognition software and may include unintentional dictation errors.   Herlinda Kirk NOVAK, FNP 04/16/24 2239    Bradler, Evan K, MD 04/17/24 [id number]

## 2024-04-17 ENCOUNTER — Telehealth: Payer: Self-pay | Admitting: Family

## 2024-04-17 MED ORDER — ONDANSETRON 4 MG PO TBDP: 4.0000 mg | ORAL_TABLET | Freq: Three times a day (TID) | ORAL | 1 refills | Status: AC | PRN

## 2024-04-17 NOTE — Telephone Encounter (Signed)
 Patient was seen in ED last night for n/v. They d/c her without prescribing her anything for nausea. Can you send in some Zofran  or something for her?

## 2024-04-18 ENCOUNTER — Other Ambulatory Visit: Payer: Self-pay

## 2024-04-18 ENCOUNTER — Inpatient Hospital Stay
Admission: EM | Admit: 2024-04-18 | Discharge: 2024-04-20 | DRG: 690 | Disposition: A | Discharge status: Home / Self Care | Attending: Internal Medicine | Admitting: Internal Medicine

## 2024-04-18 ENCOUNTER — Emergency Department

## 2024-04-18 DIAGNOSIS — E7800 Pure hypercholesterolemia, unspecified: Secondary | ICD-10-CM | POA: Diagnosis present

## 2024-04-18 DIAGNOSIS — Z794 Long term (current) use of insulin: Secondary | ICD-10-CM

## 2024-04-18 DIAGNOSIS — N3000 Acute cystitis without hematuria: Secondary | ICD-10-CM

## 2024-04-18 DIAGNOSIS — E1122 Type 2 diabetes mellitus with diabetic chronic kidney disease: Secondary | ICD-10-CM | POA: Diagnosis present

## 2024-04-18 DIAGNOSIS — E1143 Type 2 diabetes mellitus with diabetic autonomic (poly)neuropathy: Secondary | ICD-10-CM | POA: Diagnosis present

## 2024-04-18 DIAGNOSIS — A419 Sepsis, unspecified organism: Principal | ICD-10-CM

## 2024-04-18 DIAGNOSIS — Z833 Family history of diabetes mellitus: Secondary | ICD-10-CM

## 2024-04-18 DIAGNOSIS — Z8269 Family history of other diseases of the musculoskeletal system and connective tissue: Secondary | ICD-10-CM

## 2024-04-18 DIAGNOSIS — N390 Urinary tract infection, site not specified: Principal | ICD-10-CM | POA: Diagnosis present

## 2024-04-18 DIAGNOSIS — E869 Volume depletion, unspecified: Secondary | ICD-10-CM | POA: Diagnosis present

## 2024-04-18 DIAGNOSIS — I129 Hypertensive chronic kidney disease with stage 1 through stage 4 chronic kidney disease, or unspecified chronic kidney disease: Secondary | ICD-10-CM | POA: Diagnosis present

## 2024-04-18 DIAGNOSIS — N179 Acute kidney failure, unspecified: Secondary | ICD-10-CM | POA: Diagnosis present

## 2024-04-18 DIAGNOSIS — K5730 Diverticulosis of large intestine without perforation or abscess without bleeding: Secondary | ICD-10-CM | POA: Diagnosis present

## 2024-04-18 DIAGNOSIS — Z880 Allergy status to penicillin: Secondary | ICD-10-CM

## 2024-04-18 DIAGNOSIS — K219 Gastro-esophageal reflux disease without esophagitis: Secondary | ICD-10-CM | POA: Diagnosis present

## 2024-04-18 DIAGNOSIS — Z87891 Personal history of nicotine dependence: Secondary | ICD-10-CM

## 2024-04-18 DIAGNOSIS — Z820 Family history of epilepsy and other diseases of the nervous system: Secondary | ICD-10-CM

## 2024-04-18 DIAGNOSIS — Z7985 Long-term (current) use of injectable non-insulin antidiabetic drugs: Secondary | ICD-10-CM

## 2024-04-18 DIAGNOSIS — Z83719 Family history of colon polyps, unspecified: Secondary | ICD-10-CM

## 2024-04-18 DIAGNOSIS — Z79899 Other long term (current) drug therapy: Secondary | ICD-10-CM

## 2024-04-18 DIAGNOSIS — D869 Sarcoidosis, unspecified: Secondary | ICD-10-CM | POA: Diagnosis present

## 2024-04-18 DIAGNOSIS — Z7951 Long term (current) use of inhaled steroids: Secondary | ICD-10-CM

## 2024-04-18 DIAGNOSIS — Z825 Family history of asthma and other chronic lower respiratory diseases: Secondary | ICD-10-CM

## 2024-04-18 DIAGNOSIS — E872 Acidosis, unspecified: Secondary | ICD-10-CM | POA: Diagnosis present

## 2024-04-18 DIAGNOSIS — Z886 Allergy status to analgesic agent status: Secondary | ICD-10-CM

## 2024-04-18 DIAGNOSIS — R112 Nausea with vomiting, unspecified: Secondary | ICD-10-CM

## 2024-04-18 DIAGNOSIS — Z8249 Family history of ischemic heart disease and other diseases of the circulatory system: Secondary | ICD-10-CM

## 2024-04-18 DIAGNOSIS — N182 Chronic kidney disease, stage 2 (mild): Secondary | ICD-10-CM | POA: Diagnosis present

## 2024-04-18 DIAGNOSIS — K3184 Gastroparesis: Secondary | ICD-10-CM | POA: Diagnosis present

## 2024-04-18 DIAGNOSIS — Z8349 Family history of other endocrine, nutritional and metabolic diseases: Secondary | ICD-10-CM

## 2024-04-18 DIAGNOSIS — K760 Fatty (change of) liver, not elsewhere classified: Secondary | ICD-10-CM | POA: Diagnosis present

## 2024-04-18 DIAGNOSIS — Z7984 Long term (current) use of oral hypoglycemic drugs: Secondary | ICD-10-CM

## 2024-04-18 LAB — CBC WITH DIFFERENTIAL/PLATELET
Abs Immature Granulocytes: 0.03 K/uL (ref 0.00–0.07)
Basophils Absolute: 0 K/uL (ref 0.0–0.1)
Basophils Relative: 0 %
Eosinophils Absolute: 0.1 K/uL (ref 0.0–0.5)
Eosinophils Relative: 1 %
HCT: 42.6 % (ref 36.0–46.0)
Hemoglobin: 14.7 g/dL (ref 12.0–15.0)
Immature Granulocytes: 0 %
Lymphocytes Relative: 21 %
Lymphs Abs: 2.1 K/uL (ref 0.7–4.0)
MCH: 31.5 pg (ref 26.0–34.0)
MCHC: 34.5 g/dL (ref 30.0–36.0)
MCV: 91.4 fL (ref 80.0–100.0)
Monocytes Absolute: 0.8 K/uL (ref 0.1–1.0)
Monocytes Relative: 8 %
Neutro Abs: 6.8 K/uL (ref 1.7–7.7)
Neutrophils Relative %: 70 %
Platelets: 216 K/uL (ref 150–400)
RBC: 4.66 MIL/uL (ref 3.87–5.11)
RDW: 12.9 % (ref 11.5–15.5)
WBC: 9.7 K/uL (ref 4.0–10.5)
nRBC: 0 % (ref 0.0–0.2)

## 2024-04-18 LAB — LACTIC ACID, PLASMA: Lactic Acid, Venous: 1.9 mmol/L (ref 0.5–1.9)

## 2024-04-18 LAB — CBG MONITORING, ED: Glucose-Capillary: 157 mg/dL — ABNORMAL HIGH (ref 70–99)

## 2024-04-18 MED ORDER — ONDANSETRON HCL 4 MG/2ML IJ SOLN
4.0000 mg | INTRAMUSCULAR | Status: AC
  Administered 2024-04-18: 4 mg via INTRAVENOUS
  Filled 2024-04-18: qty 2

## 2024-04-18 MED ORDER — LACTATED RINGERS IV BOLUS (SEPSIS)
1000.0000 mL | Freq: Once | INTRAVENOUS | Status: AC
  Administered 2024-04-18: 1000 mL via INTRAVENOUS

## 2024-04-18 NOTE — ED Triage Notes (Signed)
 Pt BIB [facility] from home for N/V and general malaise since Sat. Seen here for UTI on 8/19, unable to be compliant with meds r/t she can't keep anything down. Hx DM and HTN. BP for EMS 204/120, HR 168.

## 2024-04-18 NOTE — ED Provider Notes (Signed)
 [facility] Provider Note    Event Date/Time   First MD Initiated Contact with Patient 04/18/24 2309     (approximate)   History   Nausea   HPI Renee Bush is a 63 y.o. female who presents for evaluation of worsening nausea and vomiting as well as some generalized abdominal pain.  The symptoms have been present for about 5 days.  She came to the ED proximately 2 to 3 days ago for similar symptoms.  She had a CT scan at that time that was reassuring other than some mild pyloric inflammation and a urinary tract infection.  Unfortunately conservative treatment at home has not worked.  She has continued to have severe nausea and vomiting, vomiting multiple times a day and unable to take much of anything by mouth.  She said that she has been trying to take her antibiotics for her UTI but she has been vomiting them back up most of the time.  The generalized abdominal pain and discomfort was getting worse by tonight so she came to the ED.  She denies alcohol use and has had no other new recent medications other than the antibiotic for her UTI that she was just recently prescribed.  No fever, chest pain, nor shortness of breath.  No history of blood clots in the legs of the lungs and no history of cardiac disease.     Physical Exam   Triage Vital Signs: ED Triage Vitals  Encounter Vitals Group     BP 04/18/24 2304 (!) 146/104     Girls Systolic BP Percentile --      Girls Diastolic BP Percentile --      Boys Systolic BP Percentile --      Boys Diastolic BP Percentile --      Pulse Rate 04/18/24 2256 (!) 142     Resp 04/18/24 2256 14     Temp 04/18/24 2256 99.1 F (37.3 C)     Temp Source 04/18/24 2256 Oral     SpO2 04/18/24 2256 100 %     Weight 04/18/24 2304 68.5 kg (151 lb 1.6 oz)     Height 04/18/24 2304 1.575 m (5' 2)     Head Circumference --      Peak Flow --      Pain Score 04/18/24 2304 8     Pain Loc --      Pain Education --      Exclude  from Growth Chart --     Most recent vital signs: Vitals:   04/19/24 0500 04/19/24 0554  BP: (!) 170/101   Pulse: (!) 126   Resp: 16   Temp:  98.2 F (36.8 C)  SpO2: 99%     General: Awake, alert, ill-appearing though nontoxic.  CV:  Good peripheral perfusion.  Tachycardia, regular rhythm, in the 130s. Resp:  Normal effort. Speaking easily and comfortably, no accessory muscle usage nor intercostal retractions.  Lungs are clear to auscultation bilaterally.  No tachypnea. Abd:  Soft with no distention.  Tender to palpation throughout the abdomen, slightly more tender in the epigastrium but with negative Murphy sign.  Minimal guarding but not peritoneal abdomen. Other:  Appears volume depleted with dry mucous membranes.   ED Results / Procedures / Treatments   Labs (all labs ordered are listed, but only abnormal results are displayed) Labs Reviewed  COMPREHENSIVE METABOLIC PANEL WITH GFR - Abnormal; Notable for the following components:      Result Value  CO2 19 (*)    Glucose, Bld 166 (*)    Creatinine, Ser 1.26 (*)    GFR, Estimated 48 (*)    Anion gap 16 (*)    All other components within normal limits  URINALYSIS, W/ REFLEX TO CULTURE (INFECTION SUSPECTED) - Abnormal; Notable for the following components:   Color, Urine YELLOW (*)    APPearance HAZY (*)    Glucose, UA 50 (*)    Ketones, ur 80 (*)    Leukocytes,Ua LARGE (*)    Bacteria, UA RARE (*)    All other components within normal limits  LIPASE, BLOOD - Abnormal; Notable for the following components:   Lipase 67 (*)    All other components within normal limits  D-DIMER, QUANTITATIVE - Abnormal; Notable for the following components:   D-Dimer, Quant 0.85 (*)    All other components within normal limits  T4, FREE - Abnormal; Notable for the following components:   Free T4 1.18 (*)    All other components within normal limits  CBG MONITORING, ED - Abnormal; Notable for the following components:    Glucose-Capillary 157 (*)    All other components within normal limits  CULTURE, BLOOD (SINGLE)  CULTURE, BLOOD (SINGLE)  LACTIC ACID, PLASMA  CBC WITH DIFFERENTIAL/PLATELET  HIV ANTIBODY (ROUTINE TESTING W REFLEX)  THYROID  PANEL WITH TSH  TROPONIN I (HIGH SENSITIVITY)     EKG  ED ECG REPORT I, Darleene Dome, the attending physician, personally viewed and interpreted this ECG.  Date: 04/18/2024 EKG Time: 22: 51 Rate: 149 Rhythm: sinus tachycardia QRS Axis: normal Intervals: normal, QTc 468 ms ST/T Wave abnormalities: Non-specific ST segment / T-wave changes, but no clear evidence of acute ischemia. Narrative Interpretation: no definitive evidence of acute ischemia; does not meet STEMI criteria.    RADIOLOGY See ED course for details   PROCEDURES:  Critical Care performed: Yes, see critical care procedure note(s)  .1-3 Lead EKG Interpretation  Performed by: Dome Darleene, MD Authorized by: Dome Darleene, MD     Interpretation: abnormal     ECG rate:  135   ECG rate assessment: tachycardic     Rhythm: sinus tachycardia     Ectopy: none     Conduction: normal   .Critical Care  Performed by: Dome Darleene, MD Authorized by: Dome Darleene, MD   Critical care provider statement:    Critical care time (minutes):  30   Critical care time was exclusive of:  Separately billable procedures and treating other patients   Critical care was necessary to treat or prevent imminent or life-threatening deterioration of the following conditions:  Sepsis   Critical care was time spent personally by me on the following activities:  Development of treatment plan with patient or surrogate, evaluation of patient's response to treatment, examination of patient, obtaining history from patient or surrogate, ordering and performing treatments and interventions, ordering and review of laboratory studies, ordering and review of radiographic studies, pulse oximetry, re-evaluation of patient's  condition and review of old charts     IMPRESSION / MDM / ASSESSMENT AND PLAN / ED COURSE  I reviewed the triage vital signs and the nursing notes.                              Differential diagnosis includes, but is not limited to, gastritis, pancreatitis, biliary disease, SBO/ileus, diverticulitis, appendicitis medication or drug side effects, sepsis.,   Patient's presentation is most consistent with  acute presentation with potential threat to life or bodily function.  Labs/studies ordered: CBC with differential, lactic acid, high-sensitivity troponin, urinalysis, CMP, lipase, blood culture, CT of the abdomen and pelvis with contrast, 1 view chest x-ray  Interventions/Medications given:  Medications  metoprolol  tartrate (LOPRESSOR ) tablet 50 mg (has no administration in time range)  nitroGLYCERIN  (NITROSTAT ) SL tablet 0.4 mg (has no administration in time range)  rosuvastatin  (CRESTOR ) tablet 5 mg (has no administration in time range)  DULoxetine  (CYMBALTA ) DR capsule 60 mg (has no administration in time range)  traZODone  (DESYREL ) tablet 25-50 mg (has no administration in time range)  Finerenone  TABS 10 mg (has no administration in time range)  insulin  glargine (LANTUS ) injection 10 Units (has no administration in time range)  pantoprazole  (PROTONIX ) EC tablet 20 mg (has no administration in time range)  cyanocobalamin  (VITAMIN B12) tablet 1,000 mcg (has no administration in time range)  gabapentin  (NEURONTIN ) capsule 300 mg (has no administration in time range)  omega-3 acid ethyl esters (LOVAZA ) capsule 1 g (has no administration in time range)  montelukast  (SINGULAIR ) tablet 10 mg (has no administration in time range)  fluticasone  furoate-vilanterol (BREO ELLIPTA ) 200-25 MCG/ACT 1 puff (has no administration in time range)  insulin  aspart (novoLOG ) injection 0-15 Units (has no administration in time range)  heparin  injection 5,000 Units (5,000 Units Subcutaneous Given 04/19/24  0528)  0.9 %  sodium chloride  infusion ( Intravenous New Bag/Given 04/19/24 0550)  oxyCODONE  (Oxy IR/ROXICODONE ) immediate release tablet 5 mg (has no administration in time range)  acetaminophen  (TYLENOL ) tablet 650 mg (has no administration in time range)    Or  acetaminophen  (TYLENOL ) suppository 650 mg (has no administration in time range)  morphine  (PF) 2 MG/ML injection 2 mg (has no administration in time range)  senna-docusate (Senokot-S) tablet 1 tablet (has no administration in time range)  metoprolol  tartrate (LOPRESSOR ) injection 5 mg (has no administration in time range)  albuterol  (PROVENTIL ) (2.5 MG/3ML) 0.083% nebulizer solution 2.5 mg (has no administration in time range)  lactated ringers  bolus 1,000 mL (0 mLs Intravenous Stopped 04/19/24 0122)  ondansetron  (ZOFRAN ) injection 4 mg (4 mg Intravenous Given 04/18/24 2335)  lactated ringers  bolus 1,000 mL (0 mLs Intravenous Stopped 04/19/24 0400)  iohexol  (OMNIPAQUE ) 300 MG/ML solution 100 mL (100 mLs Intravenous Contrast Given 04/19/24 0132)  cefTRIAXone  (ROCEPHIN ) 1 g in sodium chloride  0.9 % 100 mL IVPB (0 g Intravenous Stopped 04/19/24 0306)  prochlorperazine  (COMPAZINE ) injection 10 mg (10 mg Intravenous Given 04/19/24 0145)  sodium chloride  0.9 % bolus 500 mL (0 mLs Intravenous Stopped 04/19/24 0544)  cefTRIAXone  (ROCEPHIN ) 1 g in sodium chloride  0.9 % 100 mL IVPB (0 g Intravenous Stopped 04/19/24 0543)    (Note:  hospital course my include additional interventions and/or labs/studies not listed above.)   Patient is tachycardic at nearly 150 upon arrival and still in the 130s after about 1/2 L of fluid.  She has gotten 4 mg of Zofran  by EMS and I ordered another 4 mg of Zofran  because her primary symptom is nausea and vomiting.  I reviewed her past ED visit and she had a CT scan which showed some pyloric inflammation but no other acute abnormality.  However she continues to feel worse and has not been able to tolerate her oral  antibiotics for her UTI.  Labs are notable for a slight increase in lipase up to 67 but this may be volume depletion as well.  Urinalysis is showing persistent UTI with large leukocytes, greater than 50  WBCs, as well as showing ketonuria, further indicative of her volume depletion.  Interestingly she does not meet sepsis criteria; she is tachycardic but otherwise meets no other SIRS criterion.  No leukocytosis.  2 L of IV fluids have been ordered.  I ordered Reglan  10 mg IV because the patient is still nauseated and I am repeating a CT of the abdomen and pelvis to see if she now has radiographic evidence to suggest pancreatitis.  The patient is on the cardiac monitor to evaluate for evidence of arrhythmia and/or significant heart rate changes.   Clinical Course as of 04/19/24 9360  Thu Apr 18, 2024  2355 I independently viewed and interpreted the patient's chest x-ray(s), and I also reviewed the radiologist's report.  No indication of pneumonia nor free air beneath the diaphragm.  Radiology confirms no acute findings. [CF]  Fri Apr 19, 2024  0230 CT ABDOMEN PELVIS W CONTRAST I independently viewed and interpreted the patient's CT of the abdomen and pelvis.  I see no evidence of diverticulitis, SBO or ileus, or any other obvious acute abnormality.  The radiology confirmed no acute findings [CF]  0347 I reassessed the patient and she still feels a little bit nauseated but feels better than she did before the Reglan .  She is still not able to tolerate anything by mouth.  She is almost done with her second liter of fluids and her heart rate is still in the 130s.  She continues to have no chest pain or shortness of breath. [CF]  0350 Given the patient's persistent tachycardia in the 130s even after appropriate fluid resuscitation and her obvious UTI with failure of outpatient antibiotics due to her intractable nausea and vomiting, I am consulting the hospitalist team for admission.  Of note, she now meets  sepsis criteria based on her tachycardia, tachypnea, and urinary tract infection.  She does not meet criteria for severe sepsis.  However I ordered more than 30 mL/kg of IV fluids by adding an additional 500 mL of normal saline to the 2 L of LR I already ordered (the target is 2.25 L). [CF]  [phone] I consulted by phone with the admitting hospitalist, and they will admit the patient - Dr. Marca [CF]    Clinical Course User Index [CF] Gordan Huxley, MD     FINAL CLINICAL IMPRESSION(S) / ED DIAGNOSES   Final diagnoses:  Sepsis, due to unspecified organism, unspecified whether acute organ dysfunction present [facility])  Acute cystitis without hematuria  Intractable nausea and vomiting     Rx / DC Orders   ED Discharge Orders     None        Note:  This document was prepared using Dragon voice recognition software and may include unintentional dictation errors.   Gordan Huxley, MD 04/19/24 [phone]

## 2024-04-19 ENCOUNTER — Ambulatory Visit: Admitting: Cardiology

## 2024-04-19 ENCOUNTER — Inpatient Hospital Stay

## 2024-04-19 ENCOUNTER — Emergency Department

## 2024-04-19 ENCOUNTER — Encounter: Payer: Self-pay | Admitting: Internal Medicine

## 2024-04-19 DIAGNOSIS — D869 Sarcoidosis, unspecified: Secondary | ICD-10-CM | POA: Diagnosis present

## 2024-04-19 DIAGNOSIS — R112 Nausea with vomiting, unspecified: Secondary | ICD-10-CM

## 2024-04-19 DIAGNOSIS — E1122 Type 2 diabetes mellitus with diabetic chronic kidney disease: Secondary | ICD-10-CM | POA: Diagnosis present

## 2024-04-19 DIAGNOSIS — Z820 Family history of epilepsy and other diseases of the nervous system: Secondary | ICD-10-CM | POA: Diagnosis not present

## 2024-04-19 DIAGNOSIS — K5730 Diverticulosis of large intestine without perforation or abscess without bleeding: Secondary | ICD-10-CM | POA: Diagnosis present

## 2024-04-19 DIAGNOSIS — Z7951 Long term (current) use of inhaled steroids: Secondary | ICD-10-CM | POA: Diagnosis not present

## 2024-04-19 DIAGNOSIS — N390 Urinary tract infection, site not specified: Secondary | ICD-10-CM | POA: Diagnosis present

## 2024-04-19 DIAGNOSIS — I129 Hypertensive chronic kidney disease with stage 1 through stage 4 chronic kidney disease, or unspecified chronic kidney disease: Secondary | ICD-10-CM | POA: Diagnosis present

## 2024-04-19 DIAGNOSIS — Z886 Allergy status to analgesic agent status: Secondary | ICD-10-CM | POA: Diagnosis not present

## 2024-04-19 DIAGNOSIS — N182 Chronic kidney disease, stage 2 (mild): Secondary | ICD-10-CM | POA: Diagnosis present

## 2024-04-19 DIAGNOSIS — N179 Acute kidney failure, unspecified: Secondary | ICD-10-CM | POA: Diagnosis present

## 2024-04-19 DIAGNOSIS — K760 Fatty (change of) liver, not elsewhere classified: Secondary | ICD-10-CM | POA: Diagnosis present

## 2024-04-19 DIAGNOSIS — A419 Sepsis, unspecified organism: Secondary | ICD-10-CM | POA: Diagnosis not present

## 2024-04-19 DIAGNOSIS — E869 Volume depletion, unspecified: Secondary | ICD-10-CM | POA: Diagnosis present

## 2024-04-19 DIAGNOSIS — E7800 Pure hypercholesterolemia, unspecified: Secondary | ICD-10-CM | POA: Diagnosis present

## 2024-04-19 DIAGNOSIS — N3001 Acute cystitis with hematuria: Secondary | ICD-10-CM | POA: Diagnosis not present

## 2024-04-19 DIAGNOSIS — Z8269 Family history of other diseases of the musculoskeletal system and connective tissue: Secondary | ICD-10-CM | POA: Diagnosis not present

## 2024-04-19 DIAGNOSIS — E872 Acidosis, unspecified: Secondary | ICD-10-CM | POA: Diagnosis present

## 2024-04-19 DIAGNOSIS — E1143 Type 2 diabetes mellitus with diabetic autonomic (poly)neuropathy: Secondary | ICD-10-CM | POA: Diagnosis present

## 2024-04-19 DIAGNOSIS — Z79899 Other long term (current) drug therapy: Secondary | ICD-10-CM | POA: Diagnosis not present

## 2024-04-19 DIAGNOSIS — Z794 Long term (current) use of insulin: Secondary | ICD-10-CM | POA: Diagnosis not present

## 2024-04-19 DIAGNOSIS — K3184 Gastroparesis: Secondary | ICD-10-CM | POA: Diagnosis present

## 2024-04-19 DIAGNOSIS — Z7985 Long-term (current) use of injectable non-insulin antidiabetic drugs: Secondary | ICD-10-CM | POA: Diagnosis not present

## 2024-04-19 DIAGNOSIS — Z83719 Family history of colon polyps, unspecified: Secondary | ICD-10-CM | POA: Diagnosis not present

## 2024-04-19 DIAGNOSIS — K219 Gastro-esophageal reflux disease without esophagitis: Secondary | ICD-10-CM | POA: Diagnosis present

## 2024-04-19 DIAGNOSIS — R110 Nausea: Secondary | ICD-10-CM | POA: Diagnosis present

## 2024-04-19 DIAGNOSIS — N3000 Acute cystitis without hematuria: Secondary | ICD-10-CM

## 2024-04-19 DIAGNOSIS — Z880 Allergy status to penicillin: Secondary | ICD-10-CM | POA: Diagnosis not present

## 2024-04-19 DIAGNOSIS — Z7984 Long term (current) use of oral hypoglycemic drugs: Secondary | ICD-10-CM | POA: Diagnosis not present

## 2024-04-19 LAB — CBG MONITORING, ED
Glucose-Capillary: 149 mg/dL — ABNORMAL HIGH (ref 70–99)
Glucose-Capillary: 122 mg/dL — ABNORMAL HIGH (ref 70–99)

## 2024-04-19 LAB — URINALYSIS, W/ REFLEX TO CULTURE (INFECTION SUSPECTED)
Bilirubin Urine: NEGATIVE
Glucose, UA: 50 mg/dL — AB
Hgb urine dipstick: NEGATIVE
Ketones, ur: 80 mg/dL — AB
Nitrite: NEGATIVE
Protein, ur: NEGATIVE mg/dL
Specific Gravity, Urine: 1.01 (ref 1.005–1.030)
WBC, UA: >50 WBC/hpf (ref 0–5)
pH: 5 (ref 5.0–8.0)

## 2024-04-19 LAB — COMPREHENSIVE METABOLIC PANEL WITH GFR
ALT: 16 U/L (ref 0–44)
AST: 26 U/L (ref 15–41)
Albumin: 3.8 g/dL (ref 3.5–5.0)
Alkaline Phosphatase: 59 U/L (ref 38–126)
Anion gap: 16 — ABNORMAL HIGH (ref 5–15)
BUN: 17 mg/dL (ref 8–23)
CO2: 19 mmol/L — ABNORMAL LOW (ref 22–32)
Calcium: 9.1 mg/dL (ref 8.9–10.3)
Chloride: 101 mmol/L (ref 98–111)
Creatinine, Ser: 1.26 mg/dL — ABNORMAL HIGH (ref 0.44–1.00)
GFR, Estimated: 48 mL/min — ABNORMAL LOW (ref >60)
Glucose, Bld: 166 mg/dL — ABNORMAL HIGH (ref 70–99)
Potassium: 4.2 mmol/L (ref 3.5–5.1)
Sodium: 136 mmol/L (ref 135–145)
Total Bilirubin: 0.6 mg/dL (ref 0.0–1.2)
Total Protein: 7 g/dL (ref 6.5–8.1)

## 2024-04-19 LAB — TROPONIN I (HIGH SENSITIVITY): Troponin I (High Sensitivity): 17 ng/L (ref <18)

## 2024-04-19 LAB — HIV ANTIBODY (ROUTINE TESTING W REFLEX): HIV Screen 4th Generation wRfx: NONREACTIVE

## 2024-04-19 LAB — LIPASE, BLOOD: Lipase: 67 U/L — ABNORMAL HIGH (ref 11–51)

## 2024-04-19 LAB — D-DIMER, QUANTITATIVE: D-Dimer, Quant: 0.85 ug{FEU}/mL — ABNORMAL HIGH (ref 0.00–0.50)

## 2024-04-19 LAB — GLUCOSE, CAPILLARY
Glucose-Capillary: 125 mg/dL — ABNORMAL HIGH (ref 70–99)
Glucose-Capillary: 158 mg/dL — ABNORMAL HIGH (ref 70–99)

## 2024-04-19 LAB — T4, FREE: Free T4: 1.18 ng/dL — ABNORMAL HIGH (ref 0.61–1.12)

## 2024-04-19 MED ORDER — INSULIN GLARGINE 100 UNIT/ML ~~LOC~~ SOLN
10.0000 [IU] | Freq: Every day | SUBCUTANEOUS | Status: DC
  Administered 2024-04-19: 10 [IU] via SUBCUTANEOUS
  Filled 2024-04-19: qty 0.1

## 2024-04-19 MED ORDER — ENSURE MAX PROTEIN PO LIQD: 11.0000 [oz_av] | Freq: Every day | ORAL | Status: DC

## 2024-04-19 MED ORDER — HEPARIN SODIUM (PORCINE) 5000 UNIT/ML IJ SOLN
5000.0000 [IU] | Freq: Three times a day (TID) | INTRAMUSCULAR | Status: DC
  Administered 2024-04-19 – 2024-04-20 (×4): 5000 [IU] via SUBCUTANEOUS
  Filled 2024-04-19 (×4): qty 1

## 2024-04-19 MED ORDER — GLUCERNA SHAKE PO LIQD
237.0000 mL | Freq: Two times a day (BID) | ORAL | Status: DC
  Administered 2024-04-19 – 2024-04-20 (×2): 237 mL via ORAL

## 2024-04-19 MED ORDER — VITAMIN B-12 1000 MCG PO TABS
1000.0000 ug | ORAL_TABLET | Freq: Every day | ORAL | Status: DC
  Administered 2024-04-19 – 2024-04-20 (×2): 1000 ug via ORAL
  Filled 2024-04-19: qty 2
  Filled 2024-04-19: qty 1

## 2024-04-19 MED ORDER — ALBUTEROL SULFATE (2.5 MG/3ML) 0.083% IN NEBU: 2.5000 mg | INHALATION_SOLUTION | Freq: Four times a day (QID) | RESPIRATORY_TRACT | Status: DC | PRN

## 2024-04-19 MED ORDER — SODIUM CHLORIDE 0.9 % IV SOLN
INTRAVENOUS | Status: DC
Start: 2024-04-19 — End: 2024-04-19

## 2024-04-19 MED ORDER — METOPROLOL TARTRATE 50 MG PO TABS
50.0000 mg | ORAL_TABLET | Freq: Two times a day (BID) | ORAL | Status: DC
  Administered 2024-04-19 – 2024-04-20 (×3): 50 mg via ORAL
  Filled 2024-04-19 (×3): qty 1

## 2024-04-19 MED ORDER — METOPROLOL TARTRATE 5 MG/5ML IV SOLN: 5.0000 mg | Freq: Four times a day (QID) | INTRAVENOUS | Status: DC | PRN

## 2024-04-19 MED ORDER — PANTOPRAZOLE SODIUM 40 MG PO TBEC
40.0000 mg | DELAYED_RELEASE_TABLET | Freq: Every day | ORAL | Status: DC
  Administered 2024-04-19 – 2024-04-20 (×2): 40 mg via ORAL
  Filled 2024-04-19 (×2): qty 1

## 2024-04-19 MED ORDER — FINERENONE 10 MG PO TABS: 1.0000 | ORAL_TABLET | Freq: Every day | ORAL | Status: DC

## 2024-04-19 MED ORDER — IOHEXOL 350 MG/ML SOLN: 75.0000 mL | Freq: Once | INTRAVENOUS | Status: DC | PRN

## 2024-04-19 MED ORDER — METOCLOPRAMIDE HCL 5 MG/ML IJ SOLN
10.0000 mg | Freq: Three times a day (TID) | INTRAMUSCULAR | Status: DC
  Administered 2024-04-19 – 2024-04-20 (×4): 10 mg via INTRAVENOUS
  Filled 2024-04-19 (×4): qty 2

## 2024-04-19 MED ORDER — ACETAMINOPHEN 650 MG RE SUPP
650.0000 mg | Freq: Four times a day (QID) | RECTAL | Status: DC | PRN
Start: 2024-04-19 — End: 2024-04-20

## 2024-04-19 MED ORDER — IOHEXOL 300 MG/ML  SOLN
100.0000 mL | Freq: Once | INTRAMUSCULAR | Status: AC | PRN
  Administered 2024-04-19: 100 mL via INTRAVENOUS

## 2024-04-19 MED ORDER — NITROGLYCERIN 0.4 MG SL SUBL: 0.4000 mg | SUBLINGUAL_TABLET | SUBLINGUAL | Status: DC | PRN

## 2024-04-19 MED ORDER — SENNOSIDES-DOCUSATE SODIUM 8.6-50 MG PO TABS: 1.0000 | ORAL_TABLET | Freq: Every evening | ORAL | Status: DC | PRN

## 2024-04-19 MED ORDER — PANTOPRAZOLE SODIUM 20 MG PO TBEC
20.0000 mg | DELAYED_RELEASE_TABLET | Freq: Every day | ORAL | Status: DC
  Filled 2024-04-19: qty 1

## 2024-04-19 MED ORDER — LACTATED RINGERS IV BOLUS
1000.0000 mL | Freq: Once | INTRAVENOUS | Status: AC
  Administered 2024-04-19: 1000 mL via INTRAVENOUS

## 2024-04-19 MED ORDER — SODIUM CHLORIDE 0.9 % IV SOLN
1.0000 g | INTRAVENOUS | Status: DC
  Administered 2024-04-19: 1 g via INTRAVENOUS
  Filled 2024-04-19: qty 10

## 2024-04-19 MED ORDER — TRAZODONE HCL 50 MG PO TABS
25.0000 mg | ORAL_TABLET | Freq: Every day | ORAL | Status: DC
  Filled 2024-04-19: qty 1

## 2024-04-19 MED ORDER — GABAPENTIN 300 MG PO CAPS
300.0000 mg | ORAL_CAPSULE | Freq: Three times a day (TID) | ORAL | Status: DC
Start: 2024-04-19 — End: 2024-04-20
  Administered 2024-04-19 – 2024-04-20 (×4): 300 mg via ORAL
  Filled 2024-04-19 (×4): qty 1

## 2024-04-19 MED ORDER — DULOXETINE HCL 30 MG PO CPEP
60.0000 mg | ORAL_CAPSULE | Freq: Two times a day (BID) | ORAL | Status: DC
  Administered 2024-04-19 – 2024-04-20 (×3): 60 mg via ORAL
  Filled 2024-04-19 (×3): qty 2

## 2024-04-19 MED ORDER — PROCHLORPERAZINE EDISYLATE 10 MG/2ML IJ SOLN
10.0000 mg | INTRAMUSCULAR | Status: AC
  Administered 2024-04-19: 10 mg via INTRAVENOUS
  Filled 2024-04-19: qty 2

## 2024-04-19 MED ORDER — ROSUVASTATIN CALCIUM 10 MG PO TABS
5.0000 mg | ORAL_TABLET | Freq: Every day | ORAL | Status: DC
  Administered 2024-04-19 – 2024-04-20 (×2): 5 mg via ORAL
  Filled 2024-04-19 (×2): qty 1

## 2024-04-19 MED ORDER — FLUTICASONE FUROATE-VILANTEROL 200-25 MCG/ACT IN AEPB
1.0000 | INHALATION_SPRAY | Freq: Every day | RESPIRATORY_TRACT | Status: DC
  Filled 2024-04-19: qty 28

## 2024-04-19 MED ORDER — MORPHINE SULFATE (PF) 2 MG/ML IV SOLN: 2.0000 mg | Freq: Four times a day (QID) | INTRAVENOUS | Status: DC | PRN

## 2024-04-19 MED ORDER — SODIUM CHLORIDE 0.9 % IV SOLN
1.0000 g | INTRAVENOUS | Status: AC
  Administered 2024-04-19: 1 g via INTRAVENOUS
  Filled 2024-04-19: qty 10

## 2024-04-19 MED ORDER — MONTELUKAST SODIUM 10 MG PO TABS
10.0000 mg | ORAL_TABLET | Freq: Every day | ORAL | Status: DC
  Administered 2024-04-19: 10 mg via ORAL
  Filled 2024-04-19: qty 1

## 2024-04-19 MED ORDER — ADULT MULTIVITAMIN W/MINERALS CH
1.0000 | ORAL_TABLET | Freq: Every day | ORAL | Status: DC
  Administered 2024-04-19 – 2024-04-20 (×2): 1 via ORAL
  Filled 2024-04-19 (×2): qty 1

## 2024-04-19 MED ORDER — SODIUM CHLORIDE 0.9 % IV BOLUS (SEPSIS)
500.0000 mL | Freq: Once | INTRAVENOUS | Status: AC
  Administered 2024-04-19: 500 mL via INTRAVENOUS

## 2024-04-19 MED ORDER — OMEGA-3-ACID ETHYL ESTERS 1 G PO CAPS
1.0000 g | ORAL_CAPSULE | Freq: Every day | ORAL | Status: DC
  Administered 2024-04-19 – 2024-04-20 (×2): 1 g via ORAL
  Filled 2024-04-19 (×2): qty 1

## 2024-04-19 MED ORDER — ACETAMINOPHEN 325 MG PO TABS: 650.0000 mg | ORAL_TABLET | Freq: Four times a day (QID) | ORAL | Status: DC | PRN

## 2024-04-19 MED ORDER — OXYCODONE HCL 5 MG PO TABS: 5.0000 mg | ORAL_TABLET | ORAL | Status: DC | PRN

## 2024-04-19 MED ORDER — ALBUTEROL SULFATE HFA 108 (90 BASE) MCG/ACT IN AERS: 2.0000 | INHALATION_SPRAY | Freq: Four times a day (QID) | RESPIRATORY_TRACT | Status: DC | PRN

## 2024-04-19 MED ORDER — INSULIN ASPART 100 UNIT/ML IJ SOLN
0.0000 [IU] | Freq: Three times a day (TID) | INTRAMUSCULAR | Status: DC
  Administered 2024-04-19 (×3): 2 [IU] via SUBCUTANEOUS
  Filled 2024-04-19: qty 2
  Filled 2024-04-19: qty 1
  Filled 2024-04-19: qty 2

## 2024-04-19 NOTE — Hospital Course (Addendum)
 Partly taken from H&P.   Renee Bush is a 63 y.o. female with medical history significant of Type IIDM, CKD III, hypertension, coronary disease, frequent urinary tract infection.  Patient presented to the emergency room 3 days prior on account of intractable abdominal pain, nausea and vomiting.  She was also having increased urinary frequency and dysuria.  CT abdomen and pelvis was done and it revealed no intra-abdominal or pelvic abnormalities, UA concerning for UTI so patient was started on Bactrim  and discharged home.  Patient return again with persistent nausea and vomiting with associated abdominal pain.  On presentation patient was significantly tachycardic, lipase elevated at 67, No leukocytosis and CBC normal, CO2 of 19, creatinine of 1.26 which is around her baseline, UA with ketonuria, large leukocytes and rare bacteria. D-dimer was elevated at 0.85 so CTA chest was ordered. CT abdomen with no acute intra-abdominal or pelvic abnormalities.  8/22: Patient with persistent tachycardia, EKG with sinus tachycardia and nonspecific ST changes, free T4 mildly elevated at 1.18 and thyroid  panel pending. Patient with no respiratory symptoms, no calf pain or tenderness, no recent travel.  She does not want CTA or more contrast at this time, which is reasonable.  CTA chest was canceled. Urine cultures pending.  Nausea and vomiting improving-Reglan  was added as there might be some element of gastroparesis with her history of diabetes-patient will get benefit from outpatient GI evaluation.  8/23: Vital stable, mild metabolic acidosis has been resolved, creatinine at 1.04 today, preliminary blood cultures negative, urine cultures pending, prior urine cultures with pansensitive E. Coli.  She received ceftriaxone  while in the hospital and is being discharged on Keflex  based on prior urine culture results as current culture results are still pending.  Her primary care provider can follow-up and make  changes to antibiotics if needed.  Patient was also taking Mounjaro  at home which can contribute to nausea and vomiting and also exacerbate gastroparesis.  Her symptoms resolved with Reglan  and she was able to tolerate diet.  Patient was given some Reglan  to use as needed before meal and advised to follow-up with her gastroenterologist to rule out any underlying gastroparesis.  She also need to discuss with her primary care provider regarding the use of Mounjaro .  She will continue on current medications and follow-up with her providers closely for further assistance.

## 2024-04-19 NOTE — Progress Notes (Signed)
 No charge progress note.  Renee Bush is a 62 y.o. female with medical history significant of Type IIDM, CKD III, hypertension, coronary disease, frequent urinary tract infection.  Patient presented to the emergency room 3 days prior on account of intractable abdominal pain, nausea and vomiting.  She was also having increased urinary frequency and dysuria.  CT abdomen and pelvis was done and it revealed no intra-abdominal or pelvic abnormalities, UA concerning for UTI so patient was started on Bactrim  and discharged home.  Patient return again with persistent nausea and vomiting with associated abdominal pain.  On presentation patient was significantly tachycardic, lipase elevated at 67, No leukocytosis and CBC normal, CO2 of 19, creatinine of 1.26 which is around her baseline, UA with ketonuria, large leukocytes and rare bacteria. D-dimer was elevated at 0.85 so CTA chest was ordered. CT abdomen with no acute intra-abdominal or pelvic abnormalities.  8/22: Patient with persistent tachycardia, EKG with sinus tachycardia and nonspecific ST changes, free T4 mildly elevated at 1.18 and thyroid  panel pending. Patient with no respiratory symptoms, no calf pain or tenderness, no recent travel.  She does not want CTA or more contrast at this time, which is reasonable.  CTA chest was canceled. Urine cultures pending.  Nausea and vomiting improving-Reglan  was added as there might be some element of gastroparesis with her history of diabetes-patient will get benefit from outpatient GI evaluation.  Her exam was benign, patient was started on soft diet.  Likely can go home tomorrow morning if continues to tolerate diet.

## 2024-04-19 NOTE — Progress Notes (Signed)
 CODE SEPSIS - PHARMACY COMMUNICATION  **Broad Spectrum Antibiotics should be administered within 1 hour of Sepsis diagnosis**  Time Code Sepsis Called/Page Received: 9676  Antibiotics Ordered: Ceftriaxone   Time of 1st antibiotic administration: 0143  Rank[place] CANDIE Dills, PharmD, [facility] 04/19/2024 3:54 AM

## 2024-04-19 NOTE — Plan of Care (Signed)
  Problem: Education: Goal: Ability to describe self-care measures that may prevent or decrease complications (Diabetes Survival Skills Education) will improve Outcome: Progressing Goal: Individualized Educational Video(s) Outcome: Progressing   Problem: Coping: Goal: Ability to adjust to condition or change in health will improve Outcome: Progressing   Problem: Fluid Volume: Goal: Ability to maintain a balanced intake and output will improve Outcome: Progressing   Problem: Health Behavior/Discharge Planning: Goal: Ability to identify and utilize available resources and services will improve Outcome: Progressing Goal: Ability to manage health-related needs will improve Outcome: Progressing   Problem: Metabolic: Goal: Ability to maintain appropriate glucose levels will improve Outcome: Progressing   Problem: Nutritional: Goal: Maintenance of adequate nutrition will improve Outcome: Progressing   Problem: Education: Goal: Knowledge of General Education information will improve Description: Including pain rating scale, medication(s)/side effects and non-pharmacologic comfort measures Outcome: Progressing

## 2024-04-19 NOTE — Progress Notes (Signed)
 Initial Nutrition Assessment  DOCUMENTATION CODES:   Not applicable  INTERVENTION:   -MVI with minerals daily -Glucerna Shake po BID, each supplement provides 220 kcal and 10 grams of protein -Ensure Max po daily, each supplement provides 150 kcal and 30 grams of protein.   -Continue carb modified diet   NUTRITION DIAGNOSIS:   Increased nutrient needs related to acute illness as evidenced by estimated needs.  GOAL:   Patient will meet greater than or equal to 90% of their needs  MONITOR:   PO intake, Supplement acceptance  REASON FOR ASSESSMENT:   Malnutrition Screening Tool    ASSESSMENT:   Pt with medical history significant of Type IIDM, CKD III, hypertension, coronary disease, frequent urinary tract infection. Admitted for persistent nausea and vomiting and abdominal pain.  Pt admitted with acute UTI with sepsis.   Reviewed I/O's: +3.2 L x 24 hours  Pt unavailable at time of visit. Attempted to speak with pt via call to hospital room phone, however, unable to reach. RD unable to obtain further nutrition-related history or complete nutrition-focused physical exam at this time.    Pt present to the ED 3 days PTA with similar symptoms; CT of abdomen revealed no abnormality. Pt was treated UTI and discharged, however, presented again with the same symptoms, which had not improved despite antibiotics (Bactrim ).   Pt currently on a carb modified diet. No meal completion data available to assess at this time.   Reviewed wt hx; pt has experienced a 11.2% wt loss over the past 3 months, which is significant for time frame. Pt is at high risk for malnutrition, however, unable to identify at this time. Pt would greatly benefit from addition of oral nutrition supplements.   Medications reviewed and include vitamin B-12, neurontin , and protonix .   Lab Results  Component Value Date   HGBA1C 6.8 (H) 02/14/2024   PTA DM medications are 17 units insulin  glargine at bedtime and 2  units insulin  lispro TID. Pt also uses a dexcom CGM.   Labs reviewed: CBGS: 122-149.  (inpatient orders for glycemic control are 0-15 units insulin  aspart TID with meals and 10 units insulin  glargine daily at bedtime).     Diet Order:   Diet Order             DIET SOFT Fluid consistency: Thin  Diet effective now                   EDUCATION NEEDS:   No education needs have been identified at this time  Skin:  Skin Assessment: Reviewed RN Assessment  Last BM:  Unknown  Height:   Ht Readings from Last 1 Encounters:  04/18/24 5' 2 (1.575 m)    Weight:   Wt Readings from Last 1 Encounters:  04/18/24 68.5 kg    Ideal Body Weight:  50 kg  BMI:  Body mass index is 27.64 kg/m.  Estimated Nutritional Needs:   Kcal:  1700-1900  Protein:  90-105 grams  Fluid:  1.7-1.9 L    Margery ORN, RD, LDN, CDCES Registered Dietitian III Certified Diabetes Care and Education Specialist If unable to reach this RD, please use RD Inpatient group chat on secure chat between hours of 8am-4 pm daily

## 2024-04-19 NOTE — TOC CM/SW Note (Signed)
 Transition of Care [facility]) - Inpatient Brief Assessment   Patient Details  Name: Renee Bush MRN: [id number] Date of Birth: 1961-03-21  Transition of Care [facility]) CM/SW Contact:    Corean ONEIDA Haddock, RN Phone Number: 04/19/2024, 3:03 PM   Clinical Narrative:   Transition of Care [facility]) Screening Note   Patient Details  Name: Renee Bush Date of Birth: 1961-03-07   Transition of Care [facility]) CM/SW Contact:    Corean ONEIDA Haddock, RN Phone Number: 04/19/2024, 3:03 PM    Transition of Care Department [facility]) has reviewed patient and no TOC needs have been identified at this time. . If new patient transition needs arise, please place a TOC consult.    Transition of Care Asessment: Insurance and Status: Insurance coverage has been reviewed Patient has primary care physician: Yes     Prior/Current Home Services: No current home services Social Drivers of Health Review: SDOH reviewed no interventions necessary Readmission risk has been reviewed: Yes Transition of care needs: no transition of care needs at this time

## 2024-04-19 NOTE — Plan of Care (Signed)
  Problem: Education: Goal: Ability to describe self-care measures that may prevent or decrease complications (Diabetes Survival Skills Education) will improve Outcome: Progressing Goal: Individualized Educational Video(s) 04/19/2024 1759 by Paulo Medford LITTIE Mickey., RN Outcome: Progressing 04/19/2024 1720 by Paulo Medford LITTIE Mickey., RN Outcome: Progressing   Problem: Coping: Goal: Ability to adjust to condition or change in health will improve 04/19/2024 1759 by Paulo Medford LITTIE Mickey., RN Outcome: Progressing 04/19/2024 1720 by Paulo Medford LITTIE Mickey., RN Outcome: Progressing   Problem: Fluid Volume: Goal: Ability to maintain a balanced intake and output will improve 04/19/2024 1759 by Paulo Medford LITTIE Mickey., RN Outcome: Progressing 04/19/2024 1720 by Paulo Medford LITTIE Mickey., RN Outcome: Progressing   Problem: Health Behavior/Discharge Planning: Goal: Ability to identify and utilize available resources and services will improve 04/19/2024 1759 by Paulo Medford LITTIE Mickey., RN Outcome: Progressing 04/19/2024 1720 by Paulo Medford LITTIE Mickey., RN Outcome: Progressing Goal: Ability to manage health-related needs will improve 04/19/2024 1759 by Paulo Medford LITTIE Mickey., RN Outcome: Progressing 04/19/2024 1720 by Paulo Medford LITTIE Mickey., RN Outcome: Progressing   Problem: Metabolic: Goal: Ability to maintain appropriate glucose levels will improve 04/19/2024 1759 by Paulo Medford LITTIE Mickey., RN Outcome: Progressing 04/19/2024 1720 by Paulo Medford LITTIE Mickey., RN Outcome: Progressing   Problem: Nutritional: Goal: Maintenance of adequate nutrition will improve 04/19/2024 1759 by Paulo Medford LITTIE Mickey., RN Outcome: Progressing 04/19/2024 1720 by Paulo Medford LITTIE Mickey., RN Outcome: Progressing Goal: Progress toward achieving an optimal weight will improve Outcome: Progressing   Problem: Tissue Perfusion: Goal: Adequacy of tissue perfusion will improve Outcome: Progressing   Problem: Education: Goal: Knowledge of General  Education information will improve Description: Including pain rating scale, medication(s)/side effects and non-pharmacologic comfort measures 04/19/2024 1759 by Paulo Medford LITTIE Mickey., RN Outcome: Progressing 04/19/2024 1720 by Paulo Medford LITTIE Mickey., RN Outcome: Progressing

## 2024-04-19 NOTE — ED Notes (Signed)
 Patient to CT.

## 2024-04-19 NOTE — H&P (Signed)
 History and Physical    Patient: Renee Bush DOB: 1960/10/22 DOA: 04/18/2024 DOS: the patient was seen and examined on 04/19/2024 PCP: Orlean Alan HERO, FNP  Patient coming from: Home  Chief Complaint: Abdominal pain, nausea and vomiting Chief Complaint  Patient presents with   Nausea   HPI: Renee Bush is a 63 y.o. female with medical history significant of Type IIDM, CKD III, hypertension, coronary disease, frequent urinary tract infection.  Patient presented to the emergency room 3 days prior on account of intractable abdominal pain, nausea and vomiting.  She however did not have any fever.  She however admitted to dysuria and increased frequency of micturition.  CT abdomen and pelvis done revealed no intra-abdominal or pelvic abnormality.  Patient was found to have urinary tract infection and was initiated on antibiotics and discharged home on Bactrim .Patient returns to the emergency room today with complaints of persistent nausea and vomiting with associated abdominal pain.  Her symptoms have not improved despite trial of antibiotics. Patient reports not been able to keep any food or medications down.  Patient returns to the emergency room today for evaluated and treated due to failed outpatient treatment.                 On this admission, patient was noted to be significantly tachycardic on admission.  Denied any fever or chills.  Patient was adequately volume repleted.  He was initiated on IV antibiotics due to inability to keep any antibiotics down.                    Review of Systems: As mentioned in the history of present illness. All other systems reviewed and are negative. Past Medical History:  Diagnosis Date   AKI (acute kidney injury) (HCC) 07/07/2022   Antibiotic-induced yeast infection 01/09/2020   Balance problem 12/18/2019   Blood in stool 12/28/2021   Chest pain 10/14/2021   Chronic kidney disease, stage III (moderate) (HCC) 10/06/2017    Constipation 07/13/2021   Diabetes mellitus without complication (HCC)    DKA, type 2 (HCC) 04/16/2022   ETOH abuse 04/18/2015   Family history of colonic polyps    GERD (gastroesophageal reflux disease)    History of allergy 06/11/2019   Hospital discharge follow-up 01/06/2022   Hypercholesteremia    Hypertension 09/10/2015   Hypokalemia 04/17/2022   Palpitations 10/28/2021   Persistent dry cough 11/16/2021   Sarcoidosis 10/12/2021   self-reported   Shortness of breath 10/28/2021   Urinary frequency 07/02/2020   Urinary tract infection 11/16/2021   Past Surgical History:  Procedure Laterality Date   APPENDECTOMY     CESAREAN SECTION  1991   COLONOSCOPY WITH PROPOFOL  N/A 06/03/2019   Procedure: COLONOSCOPY WITH PROPOFOL ;  Surgeon: Therisa Bi, MD;  Location: [facility] ENDOSCOPY;  Service: Gastroenterology;  Laterality: N/A;   Social History:  reports that she quit smoking about 19 years ago. Her smoking use included cigarettes. She started smoking about 24 years ago. She has a 5 pack-year smoking history. She has never used smokeless tobacco. She reports that she does not currently use alcohol after a past usage of about 14.0 standard drinks of alcohol per week. She reports that she does not use drugs.  Allergies  Allergen Reactions   Etodolac Rash   Penicillin G Rash   Penicillins Rash    Has patient had a PCN reaction causing immediate rash, facial/tongue/throat swelling, SOB or lightheadedness with hypotension: Yes  Has patient had a PCN  reaction causing severe rash involving mucus membranes or skin necrosis: No  Has patient had a PCN reaction that required hospitalization: No  Has patient had a PCN reaction occurring within the last 10 years: No  If all of the above answers are NO, then may proceed with Cephalosporin use.  Has patient had a PCN reaction causing immediate rash, facial/tongue/throat swelling, SOB or lightheadedness with hypotension: Yes Has patient had a PCN  reaction causing severe rash involving mucus membranes or skin necrosis: No Has patient had a PCN reaction that required hospitalization: No Has patient had a PCN reaction occurring within the last 10 years: No If all of the above answers are NO, then may proceed with Cephalosporin use.    Family History  Problem Relation Age of Onset   COPD Mother    Alzheimer's disease Mother    Hypertension Father    Hyperlipidemia Father    Heart attack Father    Diabetes type II Father    Lupus Father    Diabetes type II Sister    Diabetes type II Sister    Diabetes type II Sister    Diabetes type II Sister    Gestational diabetes Daughter     Prior to Admission medications   Medication Sig Start Date End Date Taking? Authorizing Provider  ACCRUFER  30 MG CAPS TAKE 1 CAPSULE(30 MG) BY MOUTH TWICE DAILY 01/01/24   Orlean Alan HERO, FNP  Acetaminophen  500 MG capsule Take by mouth every 6 (six) hours as needed.    [provider]  albuterol  (PROVENTIL  HFA) 108 (90 Base) MCG/ACT inhaler Inhale 2 puffs into the lungs every 6 (six) hours as needed for wheezing or shortness of breath. 02/14/24   Orlean Alan HERO, FNP  azelastine  (ASTELIN ) 0.1 % nasal spray Place 1 spray into both nostrils 2 (two) times daily. Use in each nostril as directed Patient not taking: Reported on 03/27/2024 01/09/23   Orlean Alan HERO, FNP  Blood Glucose Monitoring Suppl [facility] VERIO) w/Device KIT Use to check glucose up to twice daily 10/31/22   Orlean Alan HERO, FNP  cetirizine  (ZYRTEC ) 10 MG tablet Take 1 tablet (10 mg total) by mouth daily. 02/14/24   Orlean Alan HERO, FNP  Continuous Glucose Receiver (DEXCOM G6 RECEIVER) DEVI 1 Device by Does not apply route daily. 02/29/24 02/28/25  Orlean Alan HERO, FNP  Continuous Glucose Sensor (DEXCOM G6 SENSOR) MISC 1 patch by Does not apply route as needed (apply every 10d). Apply one patch every 10 days 02/29/24 05/29/24  Orlean Alan HERO, FNP  Continuous Glucose Transmitter  (DEXCOM G6 TRANSMITTER) MISC 1 Device by Does not apply route daily. 02/29/24 02/28/25  Orlean Alan HERO, FNP  cyanocobalamin  (VITAMIN B12) 1000 MCG tablet Take 1,000 mcg by mouth daily.    [provider]  diclofenac  (VOLTAREN ) 75 MG EC tablet Take 1 tablet (75 mg total) by mouth 2 (two) times daily. Patient not taking: Reported on 03/27/2024 08/16/23   Orlean Alan HERO, FNP  DULoxetine  (CYMBALTA ) 60 MG capsule Take 1 capsule (60 mg total) by mouth 2 (two) times daily. 11/14/23   Orlean Alan HERO, FNP  fexofenadine  (ALLEGRA ) 180 MG tablet Take 1 tablet (180 mg total) by mouth daily. 11/14/23   Orlean Alan HERO, FNP  Finerenone  (KERENDIA ) 10 MG TABS Take 1 tablet (10 mg total) by mouth daily. 11/14/23   Orlean Alan HERO, FNP  gabapentin  (NEURONTIN ) 400 MG capsule TAKE 1 CAPSULE BY MOUTH TWICE DAILY AND 2 CAPSULES AT BEDTIME AS  DIRECTED 04/16/24   Scoggins, Amber, NP  insulin  glargine (LANTUS  SOLOSTAR) 100 UNIT/ML Solostar Pen Inject 17 Units into the skin at bedtime. 11/14/23   Orlean Alan HERO, FNP  insulin  lispro (HUMALOG ) 100 UNIT/ML injection INJECT 2 UNITS UNDER THE SKIN THREE TIMES DAILY BEFORE MEALS SLIDING SCALE MAX 30 UNITS PER DAY DISCARD VIAL AFTER 28 DAYS ONCE PUNCTURED 11/14/23   Orlean Alan HERO, FNP  Insulin  Pen Needle 32G X 6 MM MISC Use as directed. 08/16/23   Orlean Alan HERO, FNP  Insulin  Syringe-Needle U-100 (INSULIN  SYRINGE .5CC/31GX5/16) 31G X 5/16 0.5 ML MISC 1 each by Does not apply route 3 (three) times daily. 11/14/23   Orlean Alan HERO, FNP  Magnesium  Oxide 400 MG CAPS Take 1 capsule (400 mg total) by mouth daily in the afternoon. 08/25/22   End, Lonni, MD  metFORMIN  (GLUCOPHAGE ) 500 MG tablet TAKE 1 TABLET(500 MG) BY MOUTH TWICE DAILY 01/05/24   Orlean Alan HERO, FNP  metoprolol  tartrate (LOPRESSOR ) 25 MG tablet Take 2 tablets (50 mg total) by mouth 2 (two) times daily. 11/14/23   Orlean Alan HERO, FNP  montelukast  (SINGULAIR ) 10 MG tablet Take 1 tablet (10 mg  total) by mouth daily. 11/14/23   Orlean Alan HERO, FNP  nitroGLYCERIN  (NITROSTAT ) 0.4 MG SL tablet DISSOLVE 1 TABLET UNDER TONGUE AS NEEDED FOR CHEST PAIN EVERY 5 MINUTES FOR MAX OF 3 DOSES IN 15 MINUTES. IF NO RELIEF AFTER FIRST DOSE CALL 911. Patient not taking: Reported on 03/27/2024 10/05/21 01/25/24  Iloabachie, Chioma E, NP  Omega-3 Fatty Acids (FISH OIL) 1000 MG CAPS Take 1,000 mg by mouth 2 (two) times daily.    [provider]  ondansetron  (ZOFRAN -ODT) 4 MG disintegrating tablet Take 1 tablet (4 mg total) by mouth every 8 (eight) hours as needed for nausea or vomiting. 04/17/24   Scoggins, Amber, NP  pantoprazole  (PROTONIX ) 20 MG tablet Take 1 tablet (20 mg total) by mouth daily. 04/16/24 04/16/25  Herlinda Belton B, FNP  rosuvastatin  (CRESTOR ) 5 MG tablet TAKE 1 TABLET BY MOUTH DAILY 01/15/24   Orlean Alan HERO, FNP  sulfamethoxazole -trimethoprim  (BACTRIM  DS) 800-160 MG tablet Take 1 tablet by mouth 2 (two) times daily for 5 days. 04/16/24 04/21/24  Triplett, Belton B, FNP  SYMBICORT  160-4.5 MCG/ACT inhaler Inhale 2 puffs into the lungs 2 (two) times daily. Rinse your mouth well after use. 11/14/23   Orlean Alan HERO, FNP  tirzepatide  (MOUNJARO ) 7.5 MG/0.5ML Pen Inject 7.5 mg into the skin once a week. 02/14/24   Orlean Alan HERO, FNP  traZODone  (DESYREL ) 50 MG tablet Take 25-50 mg by mouth at bedtime. 01/30/24   [provider]  XIIDRA 5 % SOLN Apply 1 drop to eye 2 (two) times daily. 04/11/23   [provider]    Physical Exam: Vitals:   04/19/24 0330 04/19/24 0454 04/19/24 0500 04/19/24 0554  BP: (!) 159/95 (!) 162/97 (!) 170/101   Pulse: (!) 136 (!) 126 (!) 126   Resp: (!) 21 18 16    Temp:    98.2 F (36.8 C)  TempSrc:    Oral  SpO2: 99% 99% 99%   Weight:      Height:       Patient looks generally well.  She looks,.  Has had no recurrence of nausea or vomiting. Head is atraumatic pupils equal round reactive to light and accommodation Neck is supple no  JVD Abdomen soft nontender Extremities without pedal edema CNS shows no focal deficits Skin negative for any new  rash. Data Reviewed: Labs significant for sodium 136, potassium 4.2, chloride 101, bicarb 19, glucose 166, creatinine 1.26, anion gap 16.  Troponin 17, lactic   acid1.9.                                                                                                                                       WBCs 9.7, hemoglobin 14.7, hematocrit 42, platelet count 216, neutrophil count 70, D-dimer 0.85, free T1.18.,  Glucose 166  EKG shows sinus tachycardia Assessment and Plan: 63 year old female recently diagnosed with urinary tract infection and was discharged home on Bactrim .  Returns with intractable nausea vomiting and intractable abdominal pain.  Patient was unable to keep antibiotics down.  Acute urinary tract infection with sepsis: Characterized by tachycardia, source of infection and elevated lactate.  Failed outpatient treatment.  Patient was empirically initiated on IV antibiotics with Rocephin .  Will continue with same.  Awaiting cultures to tailor antibiotics accordingly.  Sinus tachycardia: Etiology is unclear.  May likely be related to urinary tract infection.  Free T4 levels ordered was 1.18.  TSH pending.  Patient has mildly elevated D-dimer, will rule out pulmonary emboli.  Anion gap metabolic acidosis: Likely may be contributing to tachycardia.  Patient has been adequately volume repleted.  Will encourage oral intake.  Acute on chronic kidney disease: Likely due to poor oral intake.  Patient will be volume repleted as appropriate.  Mildly elevated lipase level: Symptoms less likely consistent with acute pancreatitis.  CT scan was unremarkable.  History of sarcoidosis.  Her last ACE test was downtrending.  Clinically looks stable.  Incidental finding of hepatic steatosis and diverticular disease.  Asymptomatic at this time.  Hypertension: Resume home medications as  tolerated by blood pressure.  Type 2 diabetes mellitus: Last A1c was 6.8.  Patient will be on insulin  sliding scale.  Fingerstick glucose monitor as per protocol.   Advance Care Planning:   Code Status: Full Code   Consults:   Family Communication: Husband at bedside  Severity of Illness: The appropriate patient status for this patient is INPATIENT. Inpatient status is judged to be reasonable and necessary in order to provide the required intensity of service to ensure the patient's safety. The patient's presenting symptoms, physical exam findings, and initial radiographic and laboratory data in the context of their chronic comorbidities is felt to place them at high risk for further clinical deterioration. Furthermore, it is not anticipated that the patient will be medically stable for discharge from the hospital within 2 midnights of admission.   * I certify that at the point of admission it is my clinical judgment that the patient will require inpatient hospital care spanning beyond 2 midnights from the point of admission due to high intensity of service, high risk for further deterioration and high frequency of surveillance required.*  Author: Maude MARLA Dart, MD 04/19/2024 6:41 AM  For on call review [email or web address].

## 2024-04-19 NOTE — Sepsis Progress Note (Signed)
 Following for sepsis monitoring ?

## 2024-04-19 NOTE — ED Notes (Signed)
 Pt ambulated to the bathroom at this time. Gait steady. Pt escorted by family member into the bathroom. This RN provided standby assist.

## 2024-04-19 NOTE — ED Notes (Signed)
 Answered call light, pt advised needs to use bathroom, disconnected pt from monitor. Family is with pt and assist her to the bathroom. Advised to ring call light once completed so I may connect her back to the monitor.

## 2024-04-20 DIAGNOSIS — N3001 Acute cystitis with hematuria: Secondary | ICD-10-CM | POA: Diagnosis not present

## 2024-04-20 DIAGNOSIS — R112 Nausea with vomiting, unspecified: Secondary | ICD-10-CM | POA: Diagnosis not present

## 2024-04-20 LAB — THYROID PANEL WITH TSH
Free Thyroxine Index: 2.5 (ref 1.2–4.9)
T3 Uptake Ratio: 35 % (ref 24–39)
T4, Total: 7.1 ug/dL (ref 4.5–12.0)
TSH: 1.08 u[IU]/mL (ref 0.450–4.500)

## 2024-04-20 LAB — BASIC METABOLIC PANEL WITH GFR
Anion gap: 6 (ref 5–15)
BUN: 8 mg/dL (ref 8–23)
CO2: 24 mmol/L (ref 22–32)
Calcium: 8.5 mg/dL — ABNORMAL LOW (ref 8.9–10.3)
Chloride: 110 mmol/L (ref 98–111)
Creatinine, Ser: 1.04 mg/dL — ABNORMAL HIGH (ref 0.44–1.00)
GFR, Estimated: >60 mL/min (ref >60)
Glucose, Bld: 98 mg/dL (ref 70–99)
Potassium: 3.7 mmol/L (ref 3.5–5.1)
Sodium: 140 mmol/L (ref 135–145)

## 2024-04-20 LAB — CBC
HCT: 35.3 % — ABNORMAL LOW (ref 36.0–46.0)
Hemoglobin: 12.3 g/dL (ref 12.0–15.0)
MCH: 31.6 pg (ref 26.0–34.0)
MCHC: 34.8 g/dL (ref 30.0–36.0)
MCV: 90.7 fL (ref 80.0–100.0)
Platelets: 164 K/uL (ref 150–400)
RBC: 3.89 MIL/uL (ref 3.87–5.11)
RDW: 13.1 % (ref 11.5–15.5)
WBC: 4.7 K/uL (ref 4.0–10.5)
nRBC: 0 % (ref 0.0–0.2)

## 2024-04-20 LAB — GLUCOSE, CAPILLARY: Glucose-Capillary: 102 mg/dL — ABNORMAL HIGH (ref 70–99)

## 2024-04-20 LAB — URINE CULTURE: Culture: <10000 — AB

## 2024-04-20 MED ORDER — CEPHALEXIN 500 MG PO CAPS: 500.0000 mg | ORAL_CAPSULE | Freq: Four times a day (QID) | ORAL | 0 refills | Status: AC

## 2024-04-20 MED ORDER — METOCLOPRAMIDE HCL 10 MG PO TABS: 10.0000 mg | ORAL_TABLET | Freq: Three times a day (TID) | ORAL | 0 refills | Status: AC

## 2024-04-20 NOTE — Discharge Summary (Signed)
 Physician Discharge Summary   Patient: Renee Bush MRN: [id number] DOB: October 29, 1960  Admit date:     04/18/2024  Discharge date: 04/20/24  Discharge Physician: Amaryllis Dare   PCP: Orlean Alan HERO, FNP   Recommendations at discharge:  Please obtain CBC and BMP on follow-up Please discuss the side effects of Mounjaro  as it can be causing her current symptoms. Patient will also get benefit from gastric emptying studies to rule out any underlying gastroparesis Please follow-up urine culture results and make changes to antibiotics if needed Follow-up with primary care provider within a week  Discharge Diagnoses: Principal Problem:   UTI (urinary tract infection) Active Problems:   Intractable nausea and vomiting   Hospital Course: Partly taken from H&P.   Renee Bush is a 63 y.o. female with medical history significant of Type IIDM, CKD III, hypertension, coronary disease, frequent urinary tract infection.  Patient presented to the emergency room 3 days prior on account of intractable abdominal pain, nausea and vomiting.  She was also having increased urinary frequency and dysuria.  CT abdomen and pelvis was done and it revealed no intra-abdominal or pelvic abnormalities, UA concerning for UTI so patient was started on Bactrim  and discharged home.  Patient return again with persistent nausea and vomiting with associated abdominal pain.  On presentation patient was significantly tachycardic, lipase elevated at 67, No leukocytosis and CBC normal, CO2 of 19, creatinine of 1.26 which is around her baseline, UA with ketonuria, large leukocytes and rare bacteria. D-dimer was elevated at 0.85 so CTA chest was ordered. CT abdomen with no acute intra-abdominal or pelvic abnormalities.  8/22: Patient with persistent tachycardia, EKG with sinus tachycardia and nonspecific ST changes, free T4 mildly elevated at 1.18 and thyroid  panel pending. Patient with no respiratory symptoms,  no calf pain or tenderness, no recent travel.  She does not want CTA or more contrast at this time, which is reasonable.  CTA chest was canceled. Urine cultures pending.  Nausea and vomiting improving-Reglan  was added as there might be some element of gastroparesis with her history of diabetes-patient will get benefit from outpatient GI evaluation.  8/23: Vital stable, mild metabolic acidosis has been resolved, creatinine at 1.04 today, preliminary blood cultures negative, urine cultures pending, prior urine cultures with pansensitive E. Coli.  She received ceftriaxone  while in the hospital and is being discharged on Keflex  based on prior urine culture results as current culture results are still pending.  Her primary care provider can follow-up and make changes to antibiotics if needed.  Patient was also taking Mounjaro  at home which can contribute to nausea and vomiting and also exacerbate gastroparesis.  Her symptoms resolved with Reglan  and she was able to tolerate diet.  Patient was given some Reglan  to use as needed before meal and advised to follow-up with her gastroenterologist to rule out any underlying gastroparesis.  She also need to discuss with her primary care provider regarding the use of Mounjaro .  She will continue on current medications and follow-up with her providers closely for further assistance.      Consultants: None Procedures performed: None Disposition: Home Diet recommendation:  Discharge Diet Orders (From admission, onward)     Start     Ordered   04/20/24 0000  Diet - low sodium heart healthy        04/20/24 1019           Cardiac and Carb modified diet DISCHARGE MEDICATION: Allergies as of 04/20/2024  Reactions   Etodolac Rash   Penicillin G Rash   Penicillins Rash   Has patient had a PCN reaction causing immediate rash, facial/tongue/throat swelling, SOB or lightheadedness with hypotension: Yes Has patient had a PCN reaction causing severe  rash involving mucus membranes or skin necrosis: No Has patient had a PCN reaction that required hospitalization: No Has patient had a PCN reaction occurring within the last 10 years: No If all of the above answers are NO, then may proceed with Cephalosporin use. Has patient had a PCN reaction causing immediate rash, facial/tongue/throat swelling, SOB or lightheadedness with hypotension: Yes Has patient had a PCN reaction causing severe rash involving mucus membranes or skin necrosis: No Has patient had a PCN reaction that required hospitalization: No Has patient had a PCN reaction occurring within the last 10 years: No If all of the above answers are NO, then may proceed with Cephalosporin use.        Medication List     STOP taking these medications    azelastine  0.1 % nasal spray Commonly known as: ASTELIN    cetirizine  10 MG tablet Commonly known as: ZYRTEC    diclofenac  75 MG EC tablet Commonly known as: VOLTAREN    sulfamethoxazole -trimethoprim  800-160 MG tablet Commonly known as: BACTRIM  DS       TAKE these medications    ACCRUFeR  30 MG Caps Generic drug: Ferric Maltol  TAKE 1 CAPSULE(30 MG) BY MOUTH TWICE DAILY   Acetaminophen  500 MG capsule Take by mouth every 6 (six) hours as needed.   albuterol  108 (90 Base) MCG/ACT inhaler Commonly known as: Proventil  HFA Inhale 2 puffs into the lungs every 6 (six) hours as needed for wheezing or shortness of breath.   cephALEXin  500 MG capsule Commonly known as: KEFLEX  Take 1 capsule (500 mg total) by mouth 4 (four) times daily for 3 days.   cyanocobalamin  1000 MCG tablet Commonly known as: VITAMIN B12 Take 1,000 mcg by mouth daily.   Dexcom G6 Receiver Devi 1 Device by Does not apply route daily.   Dexcom G6 Sensor Misc 1 patch by Does not apply route as needed (apply every 10d). Apply one patch every 10 days   Dexcom G6 Transmitter Misc 1 Device by Does not apply route daily.   DULoxetine  60 MG  capsule Commonly known as: CYMBALTA  Take 1 capsule (60 mg total) by mouth 2 (two) times daily.   fexofenadine  180 MG tablet Commonly known as: ALLEGRA  Take 1 tablet (180 mg total) by mouth daily.   Fish Oil 1000 MG Caps Take 1,000 mg by mouth 2 (two) times daily.   gabapentin  400 MG capsule Commonly known as: NEURONTIN  TAKE 1 CAPSULE BY MOUTH TWICE DAILY AND 2 CAPSULES AT BEDTIME AS DIRECTED   insulin  lispro 100 UNIT/ML injection Commonly known as: HUMALOG  INJECT 2 UNITS UNDER THE SKIN THREE TIMES DAILY BEFORE MEALS SLIDING SCALE MAX 30 UNITS PER DAY DISCARD VIAL AFTER 28 DAYS ONCE PUNCTURED   Insulin  Pen Needle 32G X 6 MM Misc Use as directed.   INSULIN  SYRINGE .5CC/31GX5/16 31G X 5/16 0.5 ML Misc 1 each by Does not apply route 3 (three) times daily.   Kerendia  10 MG Tabs Generic drug: Finerenone  Take 1 tablet (10 mg total) by mouth daily.   Lantus  SoloStar 100 UNIT/ML Solostar Pen Generic drug: insulin  glargine Inject 17 Units into the skin at bedtime.   Magnesium  Oxide 400 MG Caps Take 1 capsule (400 mg total) by mouth daily in the afternoon.   metFORMIN  500  MG tablet Commonly known as: GLUCOPHAGE  TAKE 1 TABLET(500 MG) BY MOUTH TWICE DAILY   metoCLOPramide  10 MG tablet Commonly known as: REGLAN  Take 1 tablet (10 mg total) by mouth 3 (three) times daily with meals.   metoprolol  tartrate 25 MG tablet Commonly known as: LOPRESSOR  Take 2 tablets (50 mg total) by mouth 2 (two) times daily.   montelukast  10 MG tablet Commonly known as: SINGULAIR  Take 1 tablet (10 mg total) by mouth daily.   Mounjaro  7.5 MG/0.5ML Pen Generic drug: tirzepatide  Inject 7.5 mg into the skin once a week.   nitroGLYCERIN  0.4 MG SL tablet Commonly known as: NITROSTAT  DISSOLVE 1 TABLET UNDER TONGUE AS NEEDED FOR CHEST PAIN EVERY 5 MINUTES FOR MAX OF 3 DOSES IN 15 MINUTES. IF NO RELIEF AFTER FIRST DOSE CALL 911.   ondansetron  4 MG disintegrating tablet Commonly known as:  ZOFRAN -ODT Take 1 tablet (4 mg total) by mouth every 8 (eight) hours as needed for nausea or vomiting.   OneTouch Verio w/Device Kit Use to check glucose up to twice daily   pantoprazole  20 MG tablet Commonly known as: Protonix  Take 1 tablet (20 mg total) by mouth daily.   rosuvastatin  5 MG tablet Commonly known as: CRESTOR  TAKE 1 TABLET BY MOUTH DAILY   Symbicort  160-4.5 MCG/ACT inhaler Generic drug: budesonide-formoterol Inhale 2 puffs into the lungs 2 (two) times daily. Rinse your mouth well after use.   traZODone  50 MG tablet Commonly known as: DESYREL  Take 25-50 mg by mouth at bedtime.   Xiidra 5 % Soln Generic drug: Lifitegrast Apply 1 drop to eye 2 (two) times daily.        Follow-up Information     Orlean Alan HERO, FNP. Schedule an appointment as soon as possible for a visit in 1 week(s).   Specialty: Family Medicine Contact information: [address] [place] [address] [phone]                Discharge Exam: Fredricka Weights   04/18/24 2304  Weight: 68.5 kg   General.  Well-developed lady, in no acute distress. Pulmonary.  Lungs clear bilaterally, normal respiratory effort. CV.  Regular rate and rhythm, no JVD, rub or murmur. Abdomen.  Soft, nontender, nondistended, BS positive. CNS.  Alert and oriented .  No focal neurologic deficit. Extremities.  No edema, no cyanosis, pulses intact and symmetrical. Psychiatry.  Judgment and insight appears normal.   Condition at discharge: stable  The results of significant diagnostics from this hospitalization (including imaging, microbiology, ancillary and laboratory) are listed below for reference.   Imaging Studies: CT ABDOMEN PELVIS W CONTRAST Result Date: 04/19/2024 CLINICAL DATA:  Abdomen pain nausea vomiting EXAM: CT ABDOMEN AND PELVIS WITH CONTRAST TECHNIQUE: Multidetector CT imaging of the abdomen and pelvis was performed using the standard protocol following bolus administration of  intravenous contrast. RADIATION DOSE REDUCTION: This exam was performed according to the departmental dose-optimization program which includes automated exposure control, adjustment of the mA and/or kV according to patient size and/or use of iterative reconstruction technique. CONTRAST:  OMNIPAQUE  IOHEXOL  300 MG/ML  SOLN COMPARISON:  CT 04/16/2024, 06/12/2023 FINDINGS: Lower chest: Lung bases demonstrate no acute airspace disease. Hepatobiliary: Hepatic steatosis. Hyperdense sludge or vicarious contrast in the gallbladder. No biliary dilatation Pancreas: Unremarkable. No pancreatic ductal dilatation or surrounding inflammatory changes. Spleen: Normal in size without focal abnormality. Adrenals/Urinary Tract: Adrenal glands are normal. No hydronephrosis. The bladder is unremarkable Stomach/Bowel: Stomach within normal limits. No dilated small bowel. Small diverticulum at the  second portion of duodenum. No convincing bowel wall thickening. Diverticular disease of the left colon. Vascular/Lymphatic: Aortic atherosclerosis. No enlarged abdominal or pelvic lymph nodes. Reproductive: Uterus and bilateral adnexa are unremarkable. Other: Negative for pelvic effusion or free air Musculoskeletal: No acute or suspicious osseous abnormality IMPRESSION: 1. No CT evidence for acute intra-abdominal or pelvic abnormality. 2. Hepatic steatosis. 3. Diverticular disease of the left colon without acute inflammatory process. 4. Aortic atherosclerosis. Aortic Atherosclerosis (ICD10-I70.0). Electronically Signed   By: Luke Bun M.D.   On: 04/19/2024 01:47   DG Chest Port 1 View Result Date: 04/18/2024 CLINICAL DATA:  Questionable sepsis, evaluate for abnormality. Nausea and vomiting and generalized malaise EXAM: PORTABLE CHEST 1 VIEW COMPARISON:  02/01/2022 FINDINGS: Stable cardiomediastinal silhouette. Aortic atherosclerotic calcification. No focal consolidation, pleural effusion, or pneumothorax. No displaced rib fractures.  IMPRESSION: No active disease. Electronically Signed   By: Norman Gatlin M.D.   On: 04/18/2024 23:51   CT ABDOMEN PELVIS W CONTRAST Result Date: 04/16/2024 CLINICAL DATA:  Abdomen pain nausea vomiting EXAM: CT ABDOMEN AND PELVIS WITH CONTRAST TECHNIQUE: Multidetector CT imaging of the abdomen and pelvis was performed using the standard protocol following bolus administration of intravenous contrast. RADIATION DOSE REDUCTION: This exam was performed according to the departmental dose-optimization program which includes automated exposure control, adjustment of the mA and/or kV according to patient size and/or use of iterative reconstruction technique. CONTRAST:  75mL OMNIPAQUE  IOHEXOL  350 MG/ML SOLN COMPARISON:  CT 06/12/2023, 04/13/2022 FINDINGS: Lower chest: Lung bases demonstrate no acute airspace disease. Hepatobiliary: No focal liver abnormality is seen. No gallstones, gallbladder wall thickening, or biliary dilatation. Pancreas: Unremarkable. No pancreatic ductal dilatation or surrounding inflammatory changes. Spleen: Normal in size without focal abnormality. Adrenals/Urinary Tract: Adrenal glands are unremarkable. Kidneys are normal, without renal calculi, focal lesion, or hydronephrosis. Bladder is unremarkable. Stomach/Bowel: The pylorus appears slightly thickened. Mild diverticular disease of the colon without acute wall thickening. No evidence of bowel wall thickening, distention, or inflammatory changes. Vascular/Lymphatic: Aortic atherosclerosis. No enlarged abdominal or pelvic lymph nodes. Reproductive: Uterus and bilateral adnexa are unremarkable. Other: No abdominal wall hernia or abnormality. No abdominopelvic ascites. Musculoskeletal: No acute or significant osseous findings. IMPRESSION: 1. No CT evidence for acute intra-abdominal or pelvic abnormality. 2. Slightly thickened appearance of pylorus which could be due to vigorous contraction or possible gastritis 3. Mild diverticular disease of  the colon without acute inflammatory process. 4. Aortic atherosclerosis. Aortic Atherosclerosis (ICD10-I70.0). Electronically Signed   By: Luke Bun M.D.   On: 04/16/2024 20:09    Microbiology: Results for orders placed or performed during the hospital encounter of 04/18/24  Blood culture (routine single)     Status: None (Preliminary result)   Collection Time: 04/18/24 11:36 PM   Specimen: BLOOD  Result Value Ref Range Status   Specimen Description BLOOD BLOOD LEFT ARM  Final   Special Requests   Final    BOTTLES DRAWN AEROBIC ONLY Blood Culture adequate volume   Culture   Final    NO GROWTH 2 DAYS Performed at [facility], [address] [place], [place] [address]    Report Status PENDING  Incomplete  Culture, blood (single)     Status: None (Preliminary result)   Collection Time: 04/19/24  4:09 AM   Specimen: BLOOD  Result Value Ref Range Status   Specimen Description BLOOD BLOOD LEFT ARM  Final   Special Requests   Final    BOTTLES DRAWN AEROBIC ONLY Blood Culture adequate volume  Culture   Final    NO GROWTH 1 DAY Performed at [facility], [address] [place], [place] [address]    Report Status PENDING  Incomplete    Labs: CBC: Recent Labs  Lab 04/16/24 1641 04/18/24 2303 04/20/24 0549  WBC 9.4 9.7 4.7  NEUTROABS  --  6.8  --   HGB 15.9* 14.7 12.3  HCT 45.4 42.6 35.3*  MCV 89.7 91.4 90.7  PLT 249 216 164   Basic Metabolic Panel: Recent Labs  Lab 04/16/24 1641 04/18/24 2303 04/20/24 0549  NA 136 136 140  K 5.5* 4.2 3.7  CL 101 101 110  CO2 20* 19* 24  GLUCOSE 188* 166* 98  BUN 21 17 8   CREATININE 1.28* 1.26* 1.04*  CALCIUM  10.1 9.1 8.5*   Liver Function Tests: Recent Labs  Lab 04/16/24 1641 04/18/24 2303  AST 31 26  ALT 21 16  ALKPHOS 59 59  BILITOT 1.2 0.6  PROT 7.3 7.0  ALBUMIN 3.9 3.8   CBG: Recent Labs  Lab 04/19/24 0727 04/19/24 1238 04/19/24 1637 04/19/24 2241 04/20/24 0749  GLUCAP 149* 122* 125*  158* 102*    Discharge time spent: greater than 30 minutes.  This record has been created using Conservation officer, historic buildings. Errors have been sought and corrected,but may not always be located. Such creation errors do not reflect on the standard of care.   Signed: Amaryllis Dare, MD [facility] 04/20/2024

## 2024-04-20 NOTE — Plan of Care (Signed)

## 2024-04-23 LAB — CULTURE, BLOOD (SINGLE)
Culture: NO GROWTH
Special Requests: ADEQUATE

## 2024-04-24 LAB — CULTURE, BLOOD (SINGLE)
Culture: NO GROWTH
Special Requests: ADEQUATE

## 2024-05-02 ENCOUNTER — Encounter: Payer: Self-pay | Admitting: Cardiology

## 2024-05-02 ENCOUNTER — Ambulatory Visit: Payer: Self-pay | Admitting: Cardiology

## 2024-05-02 ENCOUNTER — Ambulatory Visit: Admitting: Cardiology

## 2024-05-02 VITALS — BP 104/68 | HR 79 | Ht 63.0 in | Wt 150.8 lb

## 2024-05-02 DIAGNOSIS — D509 Iron deficiency anemia, unspecified: Secondary | ICD-10-CM

## 2024-05-02 DIAGNOSIS — N3001 Acute cystitis with hematuria: Secondary | ICD-10-CM | POA: Diagnosis not present

## 2024-05-02 DIAGNOSIS — N1831 Chronic kidney disease, stage 3a: Secondary | ICD-10-CM

## 2024-05-02 DIAGNOSIS — Z0130 Encounter for examination of blood pressure without abnormal findings: Secondary | ICD-10-CM

## 2024-05-02 LAB — POCT URINALYSIS DIPSTICK
Bilirubin, UA: NEGATIVE
Blood, UA: NEGATIVE
Glucose, UA: NEGATIVE
Ketones, UA: NEGATIVE
Nitrite, UA: NEGATIVE
Protein, UA: NEGATIVE
Spec Grav, UA: 1.02 (ref 1.010–1.025)
Urobilinogen, UA: 0.2 U/dL
pH, UA: 6 (ref 5.0–8.0)

## 2024-05-02 MED ORDER — CEPHALEXIN 500 MG PO CAPS: 500.0000 mg | ORAL_CAPSULE | Freq: Four times a day (QID) | ORAL | 0 refills | Status: AC

## 2024-05-02 NOTE — Progress Notes (Signed)
 Established Patient Office Visit  Subjective:  Patient ID: Renee Bush, female    DOB: 11/29/1960  Age: 63 y.o. MRN: [id number]  Chief Complaint  Patient presents with   Hospitalization Follow-up    Hospital Follow Up/ Recent Falls/ UTI Recheck    Patient in office for an acute visit, possible UTI. Was hospitalized from 8/21 to 8/23 for sepsis related to UTI. Patient continues to have symptoms. Complains of flank pain, frequency. Patient also reports dizziness prior to falling twice last Friday night. No falls since then.  Discharge paperwork recommend a CMP and CBC at follow up, will get today.  UA today abnormal. Patient was discharged from the hospital on cephalexin  with no known side effects. Will send in cephalexin  today. Will send urine for culture. Recommend probiotic while taking antibiotic.   Urinary Tract Infection  This is a new problem. The current episode started 1 to 4 weeks ago. The problem has been unchanged. The quality of the pain is described as aching. The pain is mild. There has been no fever. Associated symptoms include flank pain and frequency. Pertinent negatives include no chills. She has tried nothing for the symptoms. The treatment provided no relief. Her past medical history is significant for recurrent UTIs.    No other concerns at this time.   Past Medical History:  Diagnosis Date   AKI (acute kidney injury) (HCC) 07/07/2022   Antibiotic-induced yeast infection 01/09/2020   Balance problem 12/18/2019   Blood in stool 12/28/2021   Chest pain 10/14/2021   Chronic kidney disease, stage III (moderate) (HCC) 10/06/2017   Constipation 07/13/2021   Diabetes mellitus without complication (HCC)    DKA, type 2 (HCC) 04/16/2022   ETOH abuse 04/18/2015   Family history of colonic polyps    GERD (gastroesophageal reflux disease)    History of allergy 06/11/2019   Hospital discharge follow-up 01/06/2022   Hypercholesteremia    Hypertension 09/10/2015    Hypokalemia 04/17/2022   Palpitations 10/28/2021   Persistent dry cough 11/16/2021   Sarcoidosis 10/12/2021   self-reported   Shortness of breath 10/28/2021   Urinary frequency 07/02/2020   Urinary tract infection 11/16/2021    Past Surgical History:  Procedure Laterality Date   APPENDECTOMY     CESAREAN SECTION  1991   COLONOSCOPY WITH PROPOFOL  N/A 06/03/2019   Procedure: COLONOSCOPY WITH PROPOFOL ;  Surgeon: Therisa Bi, MD;  Location: [facility] ENDOSCOPY;  Service: Gastroenterology;  Laterality: N/A;    Social History   Socioeconomic History   Marital status: Married    Spouse name: Gaither   Number of children: 4   Years of education: Not on file   Highest education level: 11th grade  Occupational History   Occupation: na  Tobacco Use   Smoking status: Former    Current packs/day: 0.00    Average packs/day: 1 pack/day for 5.0 years (5.0 ttl pk-yrs)    Types: Cigarettes    Start date: 04/17/2000    Quit date: 04/17/2005    Years since quitting: 19.0   Smokeless tobacco: Never  Vaping Use   Vaping status: Never Used  Substance and Sexual Activity   Alcohol use: Not Currently    Alcohol/week: 14.0 standard drinks of alcohol    Types: 14 Cans of beer per week    Comment: quit ~2020   Drug use: No   Sexual activity: Not Currently  Other Topics Concern   Not on file  Social History Narrative   Lives in mobile home paid  for   Social Drivers of Health   Financial Resource Strain: Low Risk  (05/19/2020)   Overall Financial Resource Strain (CARDIA)    Difficulty of Paying Living Expenses: Not hard at all  Food Insecurity: No Food Insecurity (04/19/2024)   Hunger Vital Sign    Worried About Running Out of Food in the Last Year: Never true    Ran Out of Food in the Last Year: Never true  Transportation Needs: No Transportation Needs (04/19/2024)   PRAPARE - Administrator, Civil Service (Medical): No    Lack of Transportation (Non-Medical): No  Physical Activity:  Insufficiently Active (05/19/2020)   Exercise Vital Sign    Days of Exercise per Week: 7 days    Minutes of Exercise per Session: 10 min  Stress: Stress Concern Present (05/28/2020)   [employer] of Occupational Health - Occupational Stress Questionnaire    Feeling of Stress : To some extent  Social Connections: Moderately Isolated (05/28/2020)   Social Connection and Isolation Panel    Frequency of Communication with Friends and Family: More than three times a week    Frequency of Social Gatherings with Friends and Family: More than three times a week    Attends Religious Services: Never    Database administrator or Organizations: No    Attends Banker Meetings: Never    Marital Status: Married  Catering manager Violence: Not At Risk (04/19/2024)   Humiliation, Afraid, Rape, and Kick questionnaire    Fear of Current or Ex-Partner: No    Emotionally Abused: No    Physically Abused: No    Sexually Abused: No    Family History  Problem Relation Age of Onset   COPD Mother    Alzheimer's disease Mother    Hypertension Father    Hyperlipidemia Father    Heart attack Father    Diabetes type II Father    Lupus Father    Diabetes type II Sister    Diabetes type II Sister    Diabetes type II Sister    Diabetes type II Sister    Gestational diabetes Daughter     Allergies  Allergen Reactions   Etodolac Rash   Penicillin G Rash   Penicillins Rash    Has patient had a PCN reaction causing immediate rash, facial/tongue/throat swelling, SOB or lightheadedness with hypotension: Yes  Has patient had a PCN reaction causing severe rash involving mucus membranes or skin necrosis: No  Has patient had a PCN reaction that required hospitalization: No  Has patient had a PCN reaction occurring within the last 10 years: No  If all of the above answers are NO, then may proceed with Cephalosporin use.  Has patient had a PCN reaction causing immediate rash,  facial/tongue/throat swelling, SOB or lightheadedness with hypotension: Yes Has patient had a PCN reaction causing severe rash involving mucus membranes or skin necrosis: No Has patient had a PCN reaction that required hospitalization: No Has patient had a PCN reaction occurring within the last 10 years: No If all of the above answers are NO, then may proceed with Cephalosporin use.    Outpatient Medications Prior to Visit  Medication Sig   ACCRUFER  30 MG CAPS TAKE 1 CAPSULE(30 MG) BY MOUTH TWICE DAILY   Acetaminophen  500 MG capsule Take by mouth every 6 (six) hours as needed.   albuterol  (PROVENTIL  HFA) 108 (90 Base) MCG/ACT inhaler Inhale 2 puffs into the lungs every 6 (six) hours as needed for wheezing  or shortness of breath.   Blood Glucose Monitoring Suppl (ONETOUCH VERIO) w/Device KIT Use to check glucose up to twice daily   Continuous Glucose Receiver (DEXCOM G6 RECEIVER) DEVI 1 Device by Does not apply route daily.   Continuous Glucose Sensor (DEXCOM G6 SENSOR) MISC 1 patch by Does not apply route as needed (apply every 10d). Apply one patch every 10 days   Continuous Glucose Transmitter (DEXCOM G6 TRANSMITTER) MISC 1 Device by Does not apply route daily.   cyanocobalamin  (VITAMIN B12) 1000 MCG tablet Take 1,000 mcg by mouth daily.   DULoxetine  (CYMBALTA ) 60 MG capsule Take 1 capsule (60 mg total) by mouth 2 (two) times daily.   fexofenadine  (ALLEGRA ) 180 MG tablet Take 1 tablet (180 mg total) by mouth daily.   Finerenone  (KERENDIA ) 10 MG TABS Take 1 tablet (10 mg total) by mouth daily.   gabapentin  (NEURONTIN ) 400 MG capsule TAKE 1 CAPSULE BY MOUTH TWICE DAILY AND 2 CAPSULES AT BEDTIME AS DIRECTED   insulin  glargine (LANTUS  SOLOSTAR) 100 UNIT/ML Solostar Pen Inject 17 Units into the skin at bedtime.   insulin  lispro (HUMALOG ) 100 UNIT/ML injection INJECT 2 UNITS UNDER THE SKIN THREE TIMES DAILY BEFORE MEALS SLIDING SCALE MAX 30 UNITS PER DAY DISCARD VIAL AFTER 28 DAYS ONCE PUNCTURED    Insulin  Pen Needle 32G X 6 MM MISC Use as directed.   Insulin  Syringe-Needle U-100 (INSULIN  SYRINGE .5CC/31GX5/16) 31G X 5/16 0.5 ML MISC 1 each by Does not apply route 3 (three) times daily.   Magnesium  Oxide 400 MG CAPS Take 1 capsule (400 mg total) by mouth daily in the afternoon.   metFORMIN  (GLUCOPHAGE ) 500 MG tablet TAKE 1 TABLET(500 MG) BY MOUTH TWICE DAILY   metoCLOPramide  (REGLAN ) 10 MG tablet Take 1 tablet (10 mg total) by mouth 3 (three) times daily with meals.   metoprolol  tartrate (LOPRESSOR ) 25 MG tablet Take 2 tablets (50 mg total) by mouth 2 (two) times daily.   montelukast  (SINGULAIR ) 10 MG tablet Take 1 tablet (10 mg total) by mouth daily.   nitroGLYCERIN  (NITROSTAT ) 0.4 MG SL tablet DISSOLVE 1 TABLET UNDER TONGUE AS NEEDED FOR CHEST PAIN EVERY 5 MINUTES FOR MAX OF 3 DOSES IN 15 MINUTES. IF NO RELIEF AFTER FIRST DOSE CALL 911.   Omega-3 Fatty Acids (FISH OIL) 1000 MG CAPS Take 1,000 mg by mouth 2 (two) times daily.   ondansetron  (ZOFRAN -ODT) 4 MG disintegrating tablet Take 1 tablet (4 mg total) by mouth every 8 (eight) hours as needed for nausea or vomiting.   pantoprazole  (PROTONIX ) 20 MG tablet Take 1 tablet (20 mg total) by mouth daily.   rosuvastatin  (CRESTOR ) 5 MG tablet TAKE 1 TABLET BY MOUTH DAILY   SYMBICORT  160-4.5 MCG/ACT inhaler Inhale 2 puffs into the lungs 2 (two) times daily. Rinse your mouth well after use.   tirzepatide  (MOUNJARO ) 7.5 MG/0.5ML Pen Inject 7.5 mg into the skin once a week.   traZODone  (DESYREL ) 50 MG tablet Take 25-50 mg by mouth at bedtime.   XIIDRA 5 % SOLN Apply 1 drop to eye 2 (two) times daily.   No facility-administered medications prior to visit.    Review of Systems  Constitutional: Negative.  Negative for chills.  HENT: Negative.    Eyes: Negative.   Respiratory: Negative.  Negative for shortness of breath.   Cardiovascular: Negative.  Negative for chest pain.  Gastrointestinal: Negative.  Negative for abdominal pain, constipation  and diarrhea.  Genitourinary:  Positive for flank pain and frequency.  Musculoskeletal:  Negative for joint pain and myalgias.  Skin: Negative.   Neurological: Negative.  Negative for dizziness and headaches.  Endo/Heme/Allergies: Negative.   All other systems reviewed and are negative.      Objective:   BP 104/68   Pulse 79   Ht 5' 3 (1.6 m)   Wt 150 lb 12.8 oz (68.4 kg)   SpO2 97%   BMI 26.71 kg/m   Vitals:   05/02/24 1042  BP: 104/68  Pulse: 79  Height: 5' 3 (1.6 m)  Weight: 150 lb 12.8 oz (68.4 kg)  SpO2: 97%  BMI (Calculated): 26.72    Physical Exam Vitals and nursing note reviewed.  Constitutional:      Appearance: Normal appearance. She is normal weight.  HENT:     Head: Normocephalic and atraumatic.     Nose: Nose normal.     Mouth/Throat:     Mouth: Mucous membranes are moist.  Eyes:     Extraocular Movements: Extraocular movements intact.     Conjunctiva/sclera: Conjunctivae normal.     Pupils: Pupils are equal, round, and reactive to light.  Cardiovascular:     Rate and Rhythm: Normal rate and regular rhythm.     Pulses: Normal pulses.     Heart sounds: Normal heart sounds.  Pulmonary:     Effort: Pulmonary effort is normal.     Breath sounds: Normal breath sounds.  Abdominal:     General: Abdomen is flat. Bowel sounds are normal.     Palpations: Abdomen is soft.  Musculoskeletal:        General: Normal range of motion.     Cervical back: Normal range of motion.  Skin:    General: Skin is warm and dry.  Neurological:     General: No focal deficit present.     Mental Status: She is alert and oriented to person, place, and time.  Psychiatric:        Mood and Affect: Mood normal.        Behavior: Behavior normal.        Thought Content: Thought content normal.        Judgment: Judgment normal.      Results for orders placed or performed in visit on 05/02/24  POCT Urinalysis Dipstick (81002)  Result Value Ref Range   Color, UA      Clarity, UA     Glucose, UA Negative Negative   Bilirubin, UA Negative    Ketones, UA Negative    Spec Grav, UA 1.020 1.010 - 1.025   Blood, UA Negative    pH, UA 6.0 5.0 - 8.0   Protein, UA Negative Negative   Urobilinogen, UA 0.2 0.2 or 1.0 E.U./dL   Nitrite, UA Negative    Leukocytes, UA Moderate (2+) (A) Negative   Appearance     Odor      Recent Results (from the past 2160 hours)  ECHOCARDIOGRAM COMPLETE     Status: None   Collection Time: 02/07/24  1:11 PM  Result Value Ref Range   AR max vel 2.16 cm2   AV Peak grad 3.6 mmHg   Ao pk vel 0.95 m/s   S' Lateral 2.10 cm   Area-P 1/2 3.17 cm2   AV Area VTI 2.46 cm2   AV Mean grad 2.0 mmHg   Single Plane A4C EF 67.5 %   Single Plane A2C EF 74.5 %   Calc EF 70.1 %   AV Area mean vel 2.26 cm2   Est  EF 60 - 65%   POCT CBG (Fasting - Glucose)     Status: Abnormal   Collection Time: 02/14/24 11:13 AM  Result Value Ref Range   Glucose Fasting, POC 123 (A) 70 - 99 mg/dL  [id number]     Status: Abnormal   Collection Time: 02/14/24 11:36 AM  Result Value Ref Range   Glucose 109 (H) 70 - 99 mg/dL   BUN 10 8 - 27 mg/dL   Creatinine, Ser 8.77 (H) 0.57 - 1.00 mg/dL   eGFR 50 (L) >40 fO/fpw/8.26   BUN/Creatinine Ratio 8 (L) 12 - 28   Sodium 139 134 - 144 mmol/L   Potassium 4.1 3.5 - 5.2 mmol/L   Chloride 102 96 - 106 mmol/L   CO2 23 20 - 29 mmol/L   Calcium  9.3 8.7 - 10.3 mg/dL   Total Protein 6.1 6.0 - 8.5 g/dL   Albumin 3.7 (L) 3.9 - 4.9 g/dL   Globulin, Total 2.4 1.5 - 4.5 g/dL   Bilirubin Total 0.3 0.0 - 1.2 mg/dL   Alkaline Phosphatase 59 44 - 121 IU/L   AST 23 0 - 40 IU/L   ALT 14 0 - 32 IU/L  Lipid panel     Status: Abnormal   Collection Time: 02/14/24 11:36 AM  Result Value Ref Range   Cholesterol, Total 128 100 - 199 mg/dL   Triglycerides 898 0 - 149 mg/dL   HDL 37 (L) >60 mg/dL   VLDL Cholesterol Cal 19 5 - 40 mg/dL   LDL Chol Calc (NIH) 72 0 - 99 mg/dL   Chol/HDL Ratio 3.5 0.0 - 4.4 ratio    Comment:                                    T. Chol/HDL Ratio                                             Men  Women                               1/2 Avg.Risk  3.4    3.3                                   Avg.Risk  5.0    4.4                                2X Avg.Risk  9.6    7.1                                3X Avg.Risk 23.4   11.0   VITAMIN D  25 Hydroxy (Vit-D Deficiency, Fractures)     Status: None   Collection Time: 02/14/24 11:36 AM  Result Value Ref Range   Vit D, 25-Hydroxy 80.4 30.0 - 100.0 ng/mL    Comment: Vitamin D  deficiency has been defined by the Institute of Medicine and an Endocrine Society practice guideline as a level of serum 25-OH vitamin D  less than 20 ng/mL (1,2). The Endocrine Society went on to  further define vitamin D  insufficiency as a level between 21 and 29 ng/mL (2). 1. IOM (Institute of Medicine). 2010. Dietary reference    intakes for calcium  and D. [place]: The    [employer]. 2. Holick MF, Binkley Esterbrook, Bischoff-Ferrari HA, et al.    Evaluation, treatment, and prevention of vitamin D     deficiency: an Endocrine Society clinical practice    guideline. JCEM. 2011 Jul; 96(7):1911-30.   Vitamin B12     Status: Abnormal   Collection Time: 02/14/24 11:36 AM  Result Value Ref Range   Vitamin B-12 1,842 (H) 232 - 1,245 pg/mL  CBC with Diff     Status: None   Collection Time: 02/14/24 11:36 AM  Result Value Ref Range   WBC 7.5 3.4 - 10.8 x10E3/uL   RBC 4.10 3.77 - 5.28 x10E6/uL   Hemoglobin 13.1 11.1 - 15.9 g/dL   Hematocrit 60.4 65.9 - 46.6 %   MCV 96 79 - 97 fL   MCH 32.0 26.6 - 33.0 pg   MCHC 33.2 31.5 - 35.7 g/dL   RDW 86.3 88.2 - 84.5 %   Platelets 205 150 - 450 x10E3/uL   Neutrophils 62 Not Estab. %   Lymphs 30 Not Estab. %   Monocytes 7 Not Estab. %   Eos 1 Not Estab. %   Basos 0 Not Estab. %   Neutrophils Absolute 4.6 1.4 - 7.0 x10E3/uL   Lymphocytes Absolute 2.3 0.7 - 3.1 x10E3/uL   Monocytes Absolute 0.6 0.1 - 0.9 x10E3/uL   EOS  (ABSOLUTE) 0.1 0.0 - 0.4 x10E3/uL   Basophils Absolute 0.0 0.0 - 0.2 x10E3/uL   Immature Granulocytes 0 Not Estab. %   Immature Grans (Abs) 0.0 0.0 - 0.1 x10E3/uL  Hemoglobin A1c     Status: Abnormal   Collection Time: 02/14/24 11:36 AM  Result Value Ref Range   Hgb A1c MFr Bld 6.8 (H) 4.8 - 5.6 %    Comment:          Prediabetes: 5.7 - 6.4          Diabetes: >6.4          Glycemic control for adults with diabetes: <7.0    Est. average glucose Bld gHb Est-mCnc 148 mg/dL  TSH     Status: None   Collection Time: 02/14/24 11:36 AM  Result Value Ref Range   TSH 1.280 0.450 - 4.500 uIU/mL  POC CREATINE & ALBUMIN,URINE     Status: Abnormal   Collection Time: 02/14/24 11:36 AM  Result Value Ref Range   Microalbumin Ur, POC 80 mg/L   Creatinine, POC 100 mg/dL   Albumin/Creatinine Ratio, Urine, POC 30-300   Renal function panel     Status: Abnormal   Collection Time: 03/27/24 10:33 AM  Result Value Ref Range   Sodium 139 135 - 145 mmol/L   Potassium 4.2 3.5 - 5.1 mmol/L   Chloride 104 98 - 111 mmol/L   CO2 26 22 - 32 mmol/L   Glucose, Bld 77 70 - 99 mg/dL    Comment: Glucose reference range applies only to samples taken after fasting for at least 8 hours.   BUN 14 8 - 23 mg/dL   Creatinine, Ser 8.79 (H) 0.44 - 1.00 mg/dL   Calcium  9.3 8.9 - 10.3 mg/dL   Phosphorus 3.9 2.5 - 4.6 mg/dL   Albumin 3.6 3.5 - 5.0 g/dL   GFR, Estimated 51 (L) >60 mL/min    Comment: (NOTE) Calculated using the  CKD-EPI Creatinine Equation (2021)    Anion gap 9 5 - 15    Comment: Performed at [facility], [address] [place], [place] [address]  Angiotensin converting enzyme     Status: None   Collection Time: 03/27/24 10:33 AM  Result Value Ref Range   Angiotensin-Converting Enzyme 47 14 - 82 U/L    Comment: (NOTE) Performed At: [facility] [address] [place], [place] [id number] Jennette Shorter MD Ey:[phone]   Lipase, blood     Status: None   Collection Time: 04/16/24   4:41 PM  Result Value Ref Range   Lipase 51 11 - 51 U/L    Comment: Performed at [facility], [address] [place], [place] [address]  Comprehensive metabolic panel     Status: Abnormal   Collection Time: 04/16/24  4:41 PM  Result Value Ref Range   Sodium 136 135 - 145 mmol/L   Potassium 5.5 (H) 3.5 - 5.1 mmol/L   Chloride 101 98 - 111 mmol/L   CO2 20 (L) 22 - 32 mmol/L   Glucose, Bld 188 (H) 70 - 99 mg/dL    Comment: Glucose reference range applies only to samples taken after fasting for at least 8 hours.   BUN 21 8 - 23 mg/dL   Creatinine, Ser 8.71 (H) 0.44 - 1.00 mg/dL   Calcium  10.1 8.9 - 10.3 mg/dL   Total Protein 7.3 6.5 - 8.1 g/dL   Albumin 3.9 3.5 - 5.0 g/dL   AST 31 15 - 41 U/L   ALT 21 0 - 44 U/L   Alkaline Phosphatase 59 38 - 126 U/L   Total Bilirubin 1.2 0.0 - 1.2 mg/dL   GFR, Estimated 47 (L) >60 mL/min    Comment: (NOTE) Calculated using the CKD-EPI Creatinine Equation (2021)    Anion gap 15 5 - 15    Comment: Performed at [facility], [address] [place], [place] [address]  CBC     Status: Abnormal   Collection Time: 04/16/24  4:41 PM  Result Value Ref Range   WBC 9.4 4.0 - 10.5 K/uL   RBC 5.06 3.87 - 5.11 MIL/uL   Hemoglobin 15.9 (H) 12.0 - 15.0 g/dL   HCT 54.5 63.9 - 53.9 %   MCV 89.7 80.0 - 100.0 fL   MCH 31.4 26.0 - 34.0 pg   MCHC 35.0 30.0 - 36.0 g/dL   RDW 87.1 88.4 - 84.4 %   Platelets 249 150 - 400 K/uL   nRBC 0.0 0.0 - 0.2 %    Comment: Performed at [facility], [address] [place], [place] [address]  Urinalysis, Routine w reflex microscopic -Urine, Clean Catch     Status: Abnormal   Collection Time: 04/16/24  6:50 PM  Result Value Ref Range   Color, Urine Jazlene Bares (A) YELLOW    Comment: BIOCHEMICALS MAY BE AFFECTED BY COLOR   APPearance CLOUDY (A) CLEAR   Specific Gravity, Urine 1.031 (H) 1.005 - 1.030   pH 5.0 5.0 - 8.0   Glucose, UA NEGATIVE NEGATIVE mg/dL   Hgb urine dipstick NEGATIVE NEGATIVE    Bilirubin Urine MODERATE (A) NEGATIVE   Ketones, ur 20 (A) NEGATIVE mg/dL   Protein, ur 899 (A) NEGATIVE mg/dL   Nitrite NEGATIVE NEGATIVE   Leukocytes,Ua MODERATE (A) NEGATIVE   RBC / HPF 0-5 0 - 5 RBC/hpf   WBC, UA >50 0 - 5 WBC/hpf   Bacteria, UA MANY (A) NONE SEEN  Squamous Epithelial / HPF 11-20 0 - 5 /HPF   Mucus PRESENT    Hyaline Casts, UA PRESENT     Comment: Performed at [facility], [address] [place], [place] [address]  CBG monitoring, ED     Status: Abnormal   Collection Time: 04/18/24 10:54 PM  Result Value Ref Range   Glucose-Capillary 157 (H) 70 - 99 mg/dL    Comment: Glucose reference range applies only to samples taken after fasting for at least 8 hours.  Comprehensive metabolic panel     Status: Abnormal   Collection Time: 04/18/24 11:03 PM  Result Value Ref Range   Sodium 136 135 - 145 mmol/L   Potassium 4.2 3.5 - 5.1 mmol/L   Chloride 101 98 - 111 mmol/L   CO2 19 (L) 22 - 32 mmol/L   Glucose, Bld 166 (H) 70 - 99 mg/dL    Comment: Glucose reference range applies only to samples taken after fasting for at least 8 hours.   BUN 17 8 - 23 mg/dL   Creatinine, Ser 8.73 (H) 0.44 - 1.00 mg/dL   Calcium  9.1 8.9 - 10.3 mg/dL   Total Protein 7.0 6.5 - 8.1 g/dL   Albumin 3.8 3.5 - 5.0 g/dL   AST 26 15 - 41 U/L   ALT 16 0 - 44 U/L   Alkaline Phosphatase 59 38 - 126 U/L   Total Bilirubin 0.6 0.0 - 1.2 mg/dL   GFR, Estimated 48 (L) >60 mL/min    Comment: (NOTE) Calculated using the CKD-EPI Creatinine Equation (2021)    Anion gap 16 (H) 5 - 15    Comment: Performed at [id number], [address] [place], [place] [address]  CBC with Differential     Status: None   Collection Time: 04/18/24 11:03 PM  Result Value Ref Range   WBC 9.7 4.0 - 10.5 K/uL   RBC 4.66 3.87 - 5.11 MIL/uL   Hemoglobin 14.7 12.0 - 15.0 g/dL   HCT 57.3 63.9 - 53.9 %   MCV 91.4 80.0 - 100.0 fL   MCH 31.5 26.0 - 34.0 pg   MCHC 34.5 30.0 - 36.0 g/dL   RDW 87.0 88.4 -  84.4 %   Platelets 216 150 - 400 K/uL   nRBC 0.0 0.0 - 0.2 %   Neutrophils Relative % 70 %   Neutro Abs 6.8 1.7 - 7.7 K/uL   Lymphocytes Relative 21 %   Lymphs Abs 2.1 0.7 - 4.0 K/uL   Monocytes Relative 8 %   Monocytes Absolute 0.8 0.1 - 1.0 K/uL   Eosinophils Relative 1 %   Eosinophils Absolute 0.1 0.0 - 0.5 K/uL   Basophils Relative 0 %   Basophils Absolute 0.0 0.0 - 0.1 K/uL   Immature Granulocytes 0 %   Abs Immature Granulocytes 0.03 0.00 - 0.07 K/uL    Comment: Performed at [facility], [address] [place], [place] [address]  Troponin I (High Sensitivity)     Status: None   Collection Time: 04/18/24 11:03 PM  Result Value Ref Range   Troponin I (High Sensitivity) 17 <18 ng/L    Comment: (NOTE) Elevated high sensitivity troponin I (hsTnI) values and significant  changes across serial measurements may suggest ACS but many other  chronic and acute conditions are known to elevate hsTnI results.  Refer to the Links section for chest pain algorithms and additional  guidance. Performed at [facility], [address], [place], [place] [address]  Lipase, blood     Status: Abnormal   Collection Time: 04/18/24 11:19 PM  Result Value Ref Range   Lipase 67 (H) 11 - 51 U/L    Comment: Performed at [facility], [address] [place], [place] [address]  Lactic acid, plasma     Status: None   Collection Time: 04/18/24 11:36 PM  Result Value Ref Range   Lactic Acid, Venous 1.9 0.5 - 1.9 mmol/L    Comment: Performed at [facility], [address] [place], [place] [address]  Blood culture (routine single)     Status: None   Collection Time: 04/18/24 11:36 PM   Specimen: BLOOD  Result Value Ref Range   Specimen Description BLOOD BLOOD LEFT ARM    Special Requests      BOTTLES DRAWN AEROBIC ONLY Blood Culture adequate volume   Culture      NO GROWTH 5 DAYS Performed at [facility], [address] [place], [place]  [address]    Report Status 04/23/2024 FINAL   Urinalysis, w/ Reflex to Culture (Infection Suspected) -Urine, Clean Catch     Status: Abnormal   Collection Time: 04/19/24 12:41 AM  Result Value Ref Range   Specimen Source URINE, RANDOM     Comment: CORRECTED ON 08/22 AT 0101: PREVIOUSLY REPORTED AS URINE, CLEAN CATCH   Color, Urine YELLOW (A) YELLOW   APPearance HAZY (A) CLEAR   Specific Gravity, Urine 1.010 1.005 - 1.030   pH 5.0 5.0 - 8.0   Glucose, UA 50 (A) NEGATIVE mg/dL   Hgb urine dipstick NEGATIVE NEGATIVE   Bilirubin Urine NEGATIVE NEGATIVE   Ketones, ur 80 (A) NEGATIVE mg/dL   Protein, ur NEGATIVE NEGATIVE mg/dL   Nitrite NEGATIVE NEGATIVE   Leukocytes,Ua LARGE (A) NEGATIVE   RBC / HPF 0-5 0 - 5 RBC/hpf   WBC, UA >50 0 - 5 WBC/hpf    Comment:        Reflex urine culture not performed if WBC <=10, OR if Squamous epithelial cells >5. If Squamous epithelial cells >5 suggest recollection.    Bacteria, UA RARE (A) NONE SEEN   Squamous Epithelial / HPF 6-10 0 - 5 /HPF   Mucus PRESENT    Hyaline Casts, UA PRESENT     Comment: Performed at [facility], [address], [place], [place] [address]  Urine Culture (for pregnant, neutropenic or urologic patients or patients with an indwelling urinary catheter)     Status: Abnormal   Collection Time: 04/19/24 12:41 AM   Specimen: Urine, Clean Catch  Result Value Ref Range   Specimen Description      URINE, CLEAN CATCH Performed at [facility], [address] [place], [place] [address]    Special Requests      NONE Performed at [facility], [address], [place], [place] [address]    Culture (A)     <10,000 COLONIES/mL INSIGNIFICANT GROWTH Performed at [facility], [address] [place], [place] [address]    Report Status 04/20/2024 FINAL   Culture, blood (single)     Status: None   Collection Time: 04/19/24  4:09 AM   Specimen: BLOOD  Result Value Ref Range   Specimen Description  BLOOD BLOOD LEFT ARM    Special Requests      BOTTLES DRAWN AEROBIC ONLY Blood Culture adequate volume   Culture      NO GROWTH 5 DAYS Performed at [facility], [address] [place], [place] [address]    Report Status 04/24/2024 FINAL   HIV Antibody (routine testing w rflx)     Status: None   Collection Time: 04/19/24  5:28 AM  Result Value Ref Range   HIV Screen 4th Generation wRfx Non Reactive Non Reactive    Comment: Performed at [facility], [address], [place], [place] [address]  D-dimer, quantitative     Status: Abnormal   Collection Time: 04/19/24  5:28 AM  Result Value Ref Range   D-Dimer, Quant 0.85 (H) 0.00 - 0.50 ug/mL-FEU    Comment: (NOTE) At the manufacturer cut-off value of 0.5 g/mL FEU, this assay has a negative predictive value of 95-100%.This assay is intended for use in conjunction with a clinical pretest probability (PTP) assessment model to exclude pulmonary embolism (PE) and deep venous thrombosis (DVT) in outpatients suspected of PE or DVT. Results should be correlated with clinical presentation. Performed at [facility], [address] [place], [place] [address]   Thyroid  Panel With TSH     Status: None   Collection Time: 04/19/24  5:28 AM  Result Value Ref Range   TSH 1.080 0.450 - 4.500 uIU/mL   T4, Total 7.1 4.5 - 12.0 ug/dL   T3 Uptake Ratio 35 24 - 39 %   Free Thyroxine Index 2.5 1.2 - 4.9    Comment: (NOTE) Performed At: [facility] [address] [place], [place] [id number] Jennette Shorter MD Ey:[id number]   T4, free     Status: Abnormal   Collection Time: 04/19/24  5:28 AM  Result Value Ref Range   Free T4 1.18 (H) 0.61 - 1.12 ng/dL    Comment: (NOTE) Biotin ingestion may interfere with free T4 tests. If the results are inconsistent with the TSH level, previous test results, or the clinical presentation, then consider biotin interference. If needed, order repeat testing after stopping  biotin. Performed at [facility], [address] [place], [place] [address]   CBG monitoring, ED     Status: Abnormal   Collection Time: 04/19/24  7:27 AM  Result Value Ref Range   Glucose-Capillary 149 (H) 70 - 99 mg/dL    Comment: Glucose reference range applies only to samples taken after fasting for at least 8 hours.   Comment 1 Notify RN    Comment 2 Document in Chart   CBG monitoring, ED     Status: Abnormal   Collection Time: 04/19/24 12:38 PM  Result Value Ref Range   Glucose-Capillary 122 (H) 70 - 99 mg/dL    Comment: Glucose reference range applies only to samples taken after fasting for at least 8 hours.  Glucose, capillary     Status: Abnormal   Collection Time: 04/19/24  4:37 PM  Result Value Ref Range   Glucose-Capillary 125 (H) 70 - 99 mg/dL    Comment: Glucose reference range applies only to samples taken after fasting for at least 8 hours.  Glucose, capillary     Status: Abnormal   Collection Time: 04/19/24 10:41 PM  Result Value Ref Range   Glucose-Capillary 158 (H) 70 - 99 mg/dL    Comment: Glucose reference range applies only to samples taken after fasting for at least 8 hours.  Basic metabolic panel     Status: Abnormal   Collection Time: 04/20/24  5:49 AM  Result Value Ref Range   Sodium 140 135 - 145 mmol/L   Potassium 3.7 3.5 - 5.1 mmol/L   Chloride 110 98 -  111 mmol/L   CO2 24 22 - 32 mmol/L   Glucose, Bld 98 70 - 99 mg/dL    Comment: Glucose reference range applies only to samples taken after fasting for at least 8 hours.   BUN 8 8 - 23 mg/dL   Creatinine, Ser 8.95 (H) 0.44 - 1.00 mg/dL   Calcium  8.5 (L) 8.9 - 10.3 mg/dL   GFR, Estimated >39 >39 mL/min    Comment: (NOTE) Calculated using the CKD-EPI Creatinine Equation (2021)    Anion gap 6 5 - 15    Comment: Performed at [facility], [address] [place], [place] [address]  CBC     Status: Abnormal   Collection Time: 04/20/24  5:49 AM  Result Value Ref Range   WBC 4.7  4.0 - 10.5 K/uL   RBC 3.89 3.87 - 5.11 MIL/uL   Hemoglobin 12.3 12.0 - 15.0 g/dL   HCT 64.6 (L) 63.9 - 53.9 %   MCV 90.7 80.0 - 100.0 fL   MCH 31.6 26.0 - 34.0 pg   MCHC 34.8 30.0 - 36.0 g/dL   RDW 86.8 88.4 - 84.4 %   Platelets 164 150 - 400 K/uL   nRBC 0.0 0.0 - 0.2 %    Comment: Performed at [facility], [address] [place], [place] [address]  Glucose, capillary     Status: Abnormal   Collection Time: 04/20/24  7:49 AM  Result Value Ref Range   Glucose-Capillary 102 (H) 70 - 99 mg/dL    Comment: Glucose reference range applies only to samples taken after fasting for at least 8 hours.   Comment 1 Notify RN    Comment 2 Document in Chart   POCT Urinalysis Dipstick (18997)     Status: Abnormal   Collection Time: 05/02/24 10:51 AM  Result Value Ref Range   Color, UA     Clarity, UA     Glucose, UA Negative Negative   Bilirubin, UA Negative    Ketones, UA Negative    Spec Grav, UA 1.020 1.010 - 1.025   Blood, UA Negative    pH, UA 6.0 5.0 - 8.0   Protein, UA Negative Negative   Urobilinogen, UA 0.2 0.2 or 1.0 E.U./dL   Nitrite, UA Negative    Leukocytes, UA Moderate (2+) (A) Negative   Appearance     Odor        Assessment & Plan:  Blood work today Cephalexin  Probiotic Urine culture  Problem List Items Addressed This Visit       Genitourinary   Chronic kidney disease, stage III (moderate) (HCC)   Relevant Orders   CMP14+EGFR   UTI (urinary tract infection) - Primary   Relevant Medications   cephALEXin  (KEFLEX ) 500 MG capsule   Other Relevant Orders   POCT Urinalysis Dipstick (18997) (Completed)   Urine Culture     Other   Iron deficiency anemia   Relevant Orders   CBC with Differential/Platelet    Return if symptoms worsen or fail to improve, for as scheduled with Alan.   Total time spent: 25 minutes  Google, NP  05/02/2024   This document may have been prepared by Dragon Voice Recognition software and as such may include  unintentional dictation errors.

## 2024-05-03 LAB — CMP14+EGFR
ALT: 13 IU/L (ref 0–32)
AST: 14 IU/L (ref 0–40)
Albumin: 3.8 g/dL — ABNORMAL LOW (ref 3.9–4.9)
Alkaline Phosphatase: 54 IU/L (ref 44–121)
BUN/Creatinine Ratio: 13 (ref 12–28)
BUN: 16 mg/dL (ref 8–27)
Bilirubin Total: 0.3 mg/dL (ref 0.0–1.2)
CO2: 25 mmol/L (ref 20–29)
Calcium: 9.9 mg/dL (ref 8.7–10.3)
Chloride: 102 mmol/L (ref 96–106)
Creatinine, Ser: 1.28 mg/dL — ABNORMAL HIGH (ref 0.57–1.00)
Globulin, Total: 2 g/dL (ref 1.5–4.5)
Glucose: 105 mg/dL — ABNORMAL HIGH (ref 70–99)
Potassium: 5.1 mmol/L (ref 3.5–5.2)
Sodium: 140 mmol/L (ref 134–144)
Total Protein: 5.8 g/dL — ABNORMAL LOW (ref 6.0–8.5)
eGFR: 47 mL/min/1.73 — ABNORMAL LOW (ref >59)

## 2024-05-03 LAB — CBC WITH DIFFERENTIAL/PLATELET
Basophils Absolute: 0 x10E3/uL (ref 0.0–0.2)
Basos: 1 %
EOS (ABSOLUTE): 0.1 x10E3/uL (ref 0.0–0.4)
Eos: 2 %
Hematocrit: 38.3 % (ref 34.0–46.6)
Hemoglobin: 12.3 g/dL (ref 11.1–15.9)
Immature Grans (Abs): 0 x10E3/uL (ref 0.0–0.1)
Immature Granulocytes: 0 %
Lymphocytes Absolute: 2.5 x10E3/uL (ref 0.7–3.1)
Lymphs: 41 %
MCH: 31.3 pg (ref 26.6–33.0)
MCHC: 32.1 g/dL (ref 31.5–35.7)
MCV: 98 fL — ABNORMAL HIGH (ref 79–97)
Monocytes Absolute: 0.8 x10E3/uL (ref 0.1–0.9)
Monocytes: 14 %
Neutrophils Absolute: 2.6 x10E3/uL (ref 1.4–7.0)
Neutrophils: 42 %
Platelets: 224 x10E3/uL (ref 150–450)
RBC: 3.93 x10E6/uL (ref 3.77–5.28)
RDW: 13.1 % (ref 11.7–15.4)
WBC: 6 x10E3/uL (ref 3.4–10.8)

## 2024-05-04 LAB — URINE CULTURE

## 2024-05-13 ENCOUNTER — Ambulatory Visit: Payer: Self-pay | Admitting: Cardiology

## 2024-05-13 ENCOUNTER — Encounter: Payer: Self-pay | Admitting: Cardiology

## 2024-05-13 ENCOUNTER — Ambulatory Visit: Admitting: Cardiology

## 2024-05-13 VITALS — BP 114/68 | HR 81 | Ht 63.0 in | Wt 154.0 lb

## 2024-05-13 DIAGNOSIS — Z0130 Encounter for examination of blood pressure without abnormal findings: Secondary | ICD-10-CM

## 2024-05-13 DIAGNOSIS — R109 Unspecified abdominal pain: Secondary | ICD-10-CM | POA: Diagnosis not present

## 2024-05-13 DIAGNOSIS — E1169 Type 2 diabetes mellitus with other specified complication: Secondary | ICD-10-CM

## 2024-05-13 DIAGNOSIS — J069 Acute upper respiratory infection, unspecified: Secondary | ICD-10-CM | POA: Diagnosis not present

## 2024-05-13 DIAGNOSIS — M545 Low back pain, unspecified: Secondary | ICD-10-CM | POA: Diagnosis not present

## 2024-05-13 DIAGNOSIS — R051 Acute cough: Secondary | ICD-10-CM

## 2024-05-13 DIAGNOSIS — R69 Illness, unspecified: Secondary | ICD-10-CM

## 2024-05-13 LAB — POCT URINALYSIS DIPSTICK
Bilirubin, UA: NEGATIVE
Blood, UA: NEGATIVE
Glucose, UA: NEGATIVE
Ketones, UA: NEGATIVE
Leukocytes, UA: NEGATIVE
Nitrite, UA: NEGATIVE
Protein, UA: NEGATIVE
Spec Grav, UA: 1.02 (ref 1.010–1.025)
Urobilinogen, UA: 0.2 U/dL
pH, UA: 6 (ref 5.0–8.0)

## 2024-05-13 LAB — POCT RAPID INFLUENZA A&B
FLU A: NEGATIVE
FLU B: NEGATIVE

## 2024-05-13 MED ORDER — MOUNJARO 10 MG/0.5ML ~~LOC~~ SOAJ: 10.0000 mg | SUBCUTANEOUS | 1 refills | Status: DC

## 2024-05-13 NOTE — Progress Notes (Signed)
 Established Patient Office Visit  Subjective:  Patient ID: Renee Bush, female    DOB: [birth year]  Age: 64 y.o. MRN: [id number]  Chief Complaint  Patient presents with   Acute Visit    Lower Left back pain, cough, congestion and sore throat x 2    Patient in office for an acute visit, complaining of lower back pain, cough, congestion and sore throat for two days. Home Covid test negative. Will swab patient for the flu today. Flu swab negative.  UA unremarkable, will send for culture.  Recommend symptom management.   URI  This is a new problem. The current episode started yesterday. The problem has been unchanged. There has been no fever. Associated symptoms include congestion, coughing, headaches, rhinorrhea, sinus pain and a sore throat. Pertinent negatives include no abdominal pain, chest pain, diarrhea or joint pain. She has tried nothing for the symptoms. The treatment provided no relief.    No other concerns at this time.   Past Medical History:  Diagnosis Date   AKI (acute kidney injury) (HCC) 07/07/2022   Antibiotic-induced yeast infection 01/09/2020   Balance problem 12/18/2019   Blood in stool 12/28/2021   Chest pain 10/14/2021   Chronic kidney disease, stage III (moderate) (HCC) 10/06/2017   Constipation 07/13/2021   Diabetes mellitus without complication (HCC)    DKA, type 2 (HCC) 04/16/2022   ETOH abuse 04/18/2015   Family history of colonic polyps    GERD (gastroesophageal reflux disease)    History of allergy 06/11/2019   Hospital discharge follow-up 01/06/2022   Hypercholesteremia    Hypertension 09/10/2015   Hypokalemia 04/17/2022   Palpitations 10/28/2021   Persistent dry cough 11/16/2021   Sarcoidosis 10/12/2021   self-reported   Shortness of breath 10/28/2021   Urinary frequency 07/02/2020   Urinary tract infection 11/16/2021    Past Surgical History:  Procedure Laterality Date   APPENDECTOMY     CESAREAN SECTION  1991   COLONOSCOPY  WITH PROPOFOL  N/A 06/03/2019   Procedure: COLONOSCOPY WITH PROPOFOL ;  Surgeon: Therisa Bi, MD;  Location: [facility] ENDOSCOPY;  Service: Gastroenterology;  Laterality: N/A;    Social History   Socioeconomic History   Marital status: Married    Spouse name: Gaither   Number of children: 4   Years of education: Not on file   Highest education level: 11th grade  Occupational History   Occupation: na  Tobacco Use   Smoking status: Former    Current packs/day: 0.00    Average packs/day: 1 pack/day for 5.0 years (5.0 ttl pk-yrs)    Types: Cigarettes    Start date: 04/17/2000    Quit date: 04/17/2005    Years since quitting: 19.0   Smokeless tobacco: Never  Vaping Use   Vaping status: Never Used  Substance and Sexual Activity   Alcohol use: Not Currently    Alcohol/week: 14.0 standard drinks of alcohol    Types: 14 Cans of beer per week    Comment: quit ~2020   Drug use: No   Sexual activity: Not Currently  Other Topics Concern   Not on file  Social History Narrative   Lives in mobile home paid for   Social Drivers of Health   Financial Resource Strain: Low Risk  (05/19/2020)   Overall Financial Resource Strain (CARDIA)    Difficulty of Paying Living Expenses: Not hard at all  Food Insecurity: No Food Insecurity (04/19/2024)   Hunger Vital Sign    Worried About Running Out of Food in  the Last Year: Never true    Ran Out of Food in the Last Year: Never true  Transportation Needs: No Transportation Needs (04/19/2024)   PRAPARE - Administrator, Civil Service (Medical): No    Lack of Transportation (Non-Medical): No  Physical Activity: Insufficiently Active (05/19/2020)   Exercise Vital Sign    Days of Exercise per Week: 7 days    Minutes of Exercise per Session: 10 min  Stress: Stress Concern Present (05/28/2020)   [employer] of Occupational Health - Occupational Stress Questionnaire    Feeling of Stress : To some extent  Social Connections: Moderately Isolated  (05/28/2020)   Social Connection and Isolation Panel    Frequency of Communication with Friends and Family: More than three times a week    Frequency of Social Gatherings with Friends and Family: More than three times a week    Attends Religious Services: Never    Database administrator or Organizations: No    Attends Banker Meetings: Never    Marital Status: Married  Catering manager Violence: Not At Risk (04/19/2024)   Humiliation, Afraid, Rape, and Kick questionnaire    Fear of Current or Ex-Partner: No    Emotionally Abused: No    Physically Abused: No    Sexually Abused: No    Family History  Problem Relation Age of Onset   COPD Mother    Alzheimer's disease Mother    Hypertension Father    Hyperlipidemia Father    Heart attack Father    Diabetes type II Father    Lupus Father    Diabetes type II Sister    Diabetes type II Sister    Diabetes type II Sister    Diabetes type II Sister    Gestational diabetes Daughter     Allergies  Allergen Reactions   Etodolac Rash   Penicillin G Rash   Penicillins Rash    Has patient had a PCN reaction causing immediate rash, facial/tongue/throat swelling, SOB or lightheadedness with hypotension: Yes  Has patient had a PCN reaction causing severe rash involving mucus membranes or skin necrosis: No  Has patient had a PCN reaction that required hospitalization: No  Has patient had a PCN reaction occurring within the last 10 years: No  If all of the above answers are NO, then may proceed with Cephalosporin use.  Has patient had a PCN reaction causing immediate rash, facial/tongue/throat swelling, SOB or lightheadedness with hypotension: Yes Has patient had a PCN reaction causing severe rash involving mucus membranes or skin necrosis: No Has patient had a PCN reaction that required hospitalization: No Has patient had a PCN reaction occurring within the last 10 years: No If all of the above answers are NO, then may  proceed with Cephalosporin use.    Outpatient Medications Prior to Visit  Medication Sig   ACCRUFER  30 MG CAPS TAKE 1 CAPSULE(30 MG) BY MOUTH TWICE DAILY   Acetaminophen  500 MG capsule Take by mouth every 6 (six) hours as needed.   albuterol  (PROVENTIL  HFA) 108 (90 Base) MCG/ACT inhaler Inhale 2 puffs into the lungs every 6 (six) hours as needed for wheezing or shortness of breath.   Blood Glucose Monitoring Suppl (ONETOUCH VERIO) w/Device KIT Use to check glucose up to twice daily   Continuous Glucose Receiver (DEXCOM G6 RECEIVER) DEVI 1 Device by Does not apply route daily.   Continuous Glucose Sensor (DEXCOM G6 SENSOR) MISC 1 patch by Does not apply route as needed (  apply every 10d). Apply one patch every 10 days   Continuous Glucose Transmitter (DEXCOM G6 TRANSMITTER) MISC 1 Device by Does not apply route daily.   cyanocobalamin  (VITAMIN B12) 1000 MCG tablet Take 1,000 mcg by mouth daily.   DULoxetine  (CYMBALTA ) 60 MG capsule Take 1 capsule (60 mg total) by mouth 2 (two) times daily.   fexofenadine  (ALLEGRA ) 180 MG tablet Take 1 tablet (180 mg total) by mouth daily.   Finerenone  (KERENDIA ) 10 MG TABS Take 1 tablet (10 mg total) by mouth daily.   gabapentin  (NEURONTIN ) 400 MG capsule TAKE 1 CAPSULE BY MOUTH TWICE DAILY AND 2 CAPSULES AT BEDTIME AS DIRECTED   insulin  glargine (LANTUS  SOLOSTAR) 100 UNIT/ML Solostar Pen Inject 17 Units into the skin at bedtime.   insulin  lispro (HUMALOG ) 100 UNIT/ML injection INJECT 2 UNITS UNDER THE SKIN THREE TIMES DAILY BEFORE MEALS SLIDING SCALE MAX 30 UNITS PER DAY DISCARD VIAL AFTER 28 DAYS ONCE PUNCTURED   Insulin  Pen Needle 32G X 6 MM MISC Use as directed.   Insulin  Syringe-Needle U-100 (INSULIN  SYRINGE .5CC/31GX5/16) 31G X 5/16 0.5 ML MISC 1 each by Does not apply route 3 (three) times daily.   Magnesium  Oxide 400 MG CAPS Take 1 capsule (400 mg total) by mouth daily in the afternoon.   metFORMIN  (GLUCOPHAGE ) 500 MG tablet TAKE 1 TABLET(500 MG) BY  MOUTH TWICE DAILY   metoCLOPramide  (REGLAN ) 10 MG tablet Take 1 tablet (10 mg total) by mouth 3 (three) times daily with meals.   metoprolol  tartrate (LOPRESSOR ) 25 MG tablet Take 2 tablets (50 mg total) by mouth 2 (two) times daily.   montelukast  (SINGULAIR ) 10 MG tablet Take 1 tablet (10 mg total) by mouth daily.   nitroGLYCERIN  (NITROSTAT ) 0.4 MG SL tablet DISSOLVE 1 TABLET UNDER TONGUE AS NEEDED FOR CHEST PAIN EVERY 5 MINUTES FOR MAX OF 3 DOSES IN 15 MINUTES. IF NO RELIEF AFTER FIRST DOSE CALL 911.   Omega-3 Fatty Acids (FISH OIL) 1000 MG CAPS Take 1,000 mg by mouth 2 (two) times daily.   ondansetron  (ZOFRAN -ODT) 4 MG disintegrating tablet Take 1 tablet (4 mg total) by mouth every 8 (eight) hours as needed for nausea or vomiting.   pantoprazole  (PROTONIX ) 20 MG tablet Take 1 tablet (20 mg total) by mouth daily.   rosuvastatin  (CRESTOR ) 5 MG tablet TAKE 1 TABLET BY MOUTH DAILY   SYMBICORT  160-4.5 MCG/ACT inhaler Inhale 2 puffs into the lungs 2 (two) times daily. Rinse your mouth well after use.   tirzepatide  (MOUNJARO ) 7.5 MG/0.5ML Pen Inject 7.5 mg into the skin once a week.   traZODone  (DESYREL ) 50 MG tablet Take 25-50 mg by mouth at bedtime.   XIIDRA 5 % SOLN Apply 1 drop to eye 2 (two) times daily.   No facility-administered medications prior to visit.    Review of Systems  Constitutional: Negative.   HENT:  Positive for congestion, rhinorrhea, sinus pain and sore throat.   Eyes: Negative.   Respiratory:  Positive for cough. Negative for sputum production and shortness of breath.   Cardiovascular: Negative.  Negative for chest pain.  Gastrointestinal: Negative.  Negative for abdominal pain, constipation and diarrhea.  Genitourinary: Negative.   Musculoskeletal:  Negative for joint pain and myalgias.  Skin: Negative.   Neurological:  Positive for headaches. Negative for dizziness.  Endo/Heme/Allergies: Negative.   All other systems reviewed and are negative.      Objective:    BP 114/68   Pulse 81   Ht 5' 3 (1.6 m)  Wt 154 lb (69.9 kg)   SpO2 98%   BMI 27.28 kg/m   Vitals:   05/13/24 1346  BP: 114/68  Pulse: 81  Height: 5' 3 (1.6 m)  Weight: 154 lb (69.9 kg)  SpO2: 98%  BMI (Calculated): 27.29    Physical Exam Vitals and nursing note reviewed.  Constitutional:      Appearance: Normal appearance. She is normal weight.  HENT:     Head: Normocephalic and atraumatic.     Nose: Nose normal.     Mouth/Throat:     Mouth: Mucous membranes are moist.  Eyes:     Extraocular Movements: Extraocular movements intact.     Conjunctiva/sclera: Conjunctivae normal.     Pupils: Pupils are equal, round, and reactive to light.  Cardiovascular:     Rate and Rhythm: Normal rate and regular rhythm.     Pulses: Normal pulses.     Heart sounds: Normal heart sounds.  Pulmonary:     Effort: Pulmonary effort is normal.     Breath sounds: Normal breath sounds.  Abdominal:     General: Abdomen is flat. Bowel sounds are normal.     Palpations: Abdomen is soft.  Musculoskeletal:        General: Normal range of motion.     Cervical back: Normal range of motion.  Skin:    General: Skin is warm and dry.  Neurological:     General: No focal deficit present.     Mental Status: She is alert and oriented to person, place, and time.  Psychiatric:        Mood and Affect: Mood normal.        Behavior: Behavior normal.        Thought Content: Thought content normal.        Judgment: Judgment normal.      Results for orders placed or performed in visit on 05/13/24  POCT Urinalysis Dipstick (81002)  Result Value Ref Range   Color, UA     Clarity, UA     Glucose, UA Negative Negative   Bilirubin, UA Negative    Ketones, UA Negative    Spec Grav, UA 1.020 1.010 - 1.025   Blood, UA Negative    pH, UA 6.0 5.0 - 8.0   Protein, UA Negative Negative   Urobilinogen, UA 0.2 0.2 or 1.0 E.U./dL   Nitrite, UA Negative    Leukocytes, UA Negative Negative   Appearance      Odor    POCT Rapid Influenza A&B  Result Value Ref Range   FLU A Negative    FLU B Negative     Recent Results (from the past 2160 hours)  POCT CBG (Fasting - Glucose)     Status: Abnormal   Collection Time: 02/14/24 11:13 AM  Result Value Ref Range   Glucose Fasting, POC 123 (A) 70 - 99 mg/dL  [id number]     Status: Abnormal   Collection Time: 02/14/24 11:36 AM  Result Value Ref Range   Glucose 109 (H) 70 - 99 mg/dL   BUN 10 8 - 27 mg/dL   Creatinine, Ser 8.77 (H) 0.57 - 1.00 mg/dL   eGFR 50 (L) >40 fO/fpw/8.26   BUN/Creatinine Ratio 8 (L) 12 - 28   Sodium 139 134 - 144 mmol/L   Potassium 4.1 3.5 - 5.2 mmol/L   Chloride 102 96 - 106 mmol/L   CO2 23 20 - 29 mmol/L   Calcium  9.3 8.7 - 10.3 mg/dL  Total Protein 6.1 6.0 - 8.5 g/dL   Albumin 3.7 (L) 3.9 - 4.9 g/dL   Globulin, Total 2.4 1.5 - 4.5 g/dL   Bilirubin Total 0.3 0.0 - 1.2 mg/dL   Alkaline Phosphatase 59 44 - 121 IU/L   AST 23 0 - 40 IU/L   ALT 14 0 - 32 IU/L  Lipid panel     Status: Abnormal   Collection Time: 02/14/24 11:36 AM  Result Value Ref Range   Cholesterol, Total 128 100 - 199 mg/dL   Triglycerides 898 0 - 149 mg/dL   HDL 37 (L) >60 mg/dL   VLDL Cholesterol Cal 19 5 - 40 mg/dL   LDL Chol Calc (NIH) 72 0 - 99 mg/dL   Chol/HDL Ratio 3.5 0.0 - 4.4 ratio    Comment:                                   T. Chol/HDL Ratio                                             Men  Women                               1/2 Avg.Risk  3.4    3.3                                   Avg.Risk  5.0    4.4                                2X Avg.Risk  9.6    7.1                                3X Avg.Risk 23.4   11.0   VITAMIN D  25 Hydroxy (Vit-D Deficiency, Fractures)     Status: None   Collection Time: 02/14/24 11:36 AM  Result Value Ref Range   Vit D, 25-Hydroxy 80.4 30.0 - 100.0 ng/mL    Comment: Vitamin D  deficiency has been defined by the Institute of Medicine and an Endocrine Society practice guideline as a level of  serum 25-OH vitamin D  less than 20 ng/mL (1,2). The Endocrine Society went on to further define vitamin D  insufficiency as a level between 21 and 29 ng/mL (2). 1. IOM (Institute of Medicine). 2010. Dietary reference    intakes for calcium  and D. [place]: The    [employer]. 2. Holick MF, Binkley [place], Bischoff-Ferrari HA, et al.    Evaluation, treatment, and prevention of vitamin D     deficiency: an Endocrine Society clinical practice    guideline. JCEM. 2011 Jul; 96(7):1911-30.   Vitamin B12     Status: Abnormal   Collection Time: 02/14/24 11:36 AM  Result Value Ref Range   Vitamin B-12 1,842 (H) 232 - 1,245 pg/mL  CBC with Diff     Status: None   Collection Time: 02/14/24 11:36 AM  Result Value Ref Range   WBC 7.5 3.4 - 10.8 x10E3/uL   RBC 4.10 3.77 - 5.28 x10E6/uL  Hemoglobin 13.1 11.1 - 15.9 g/dL   Hematocrit 60.4 65.9 - 46.6 %   MCV 96 79 - 97 fL   MCH 32.0 26.6 - 33.0 pg   MCHC 33.2 31.5 - 35.7 g/dL   RDW 86.3 88.2 - 84.5 %   Platelets 205 150 - 450 x10E3/uL   Neutrophils 62 Not Estab. %   Lymphs 30 Not Estab. %   Monocytes 7 Not Estab. %   Eos 1 Not Estab. %   Basos 0 Not Estab. %   Neutrophils Absolute 4.6 1.4 - 7.0 x10E3/uL   Lymphocytes Absolute 2.3 0.7 - 3.1 x10E3/uL   Monocytes Absolute 0.6 0.1 - 0.9 x10E3/uL   EOS (ABSOLUTE) 0.1 0.0 - 0.4 x10E3/uL   Basophils Absolute 0.0 0.0 - 0.2 x10E3/uL   Immature Granulocytes 0 Not Estab. %   Immature Grans (Abs) 0.0 0.0 - 0.1 x10E3/uL  Hemoglobin A1c     Status: Abnormal   Collection Time: 02/14/24 11:36 AM  Result Value Ref Range   Hgb A1c MFr Bld 6.8 (H) 4.8 - 5.6 %    Comment:          Prediabetes: 5.7 - 6.4          Diabetes: >6.4          Glycemic control for adults with diabetes: <7.0    Est. average glucose Bld gHb Est-mCnc 148 mg/dL  TSH     Status: None   Collection Time: 02/14/24 11:36 AM  Result Value Ref Range   TSH 1.280 0.450 - 4.500 uIU/mL  POC CREATINE & ALBUMIN,URINE     Status:  Abnormal   Collection Time: 02/14/24 11:36 AM  Result Value Ref Range   Microalbumin Ur, POC 80 mg/L   Creatinine, POC 100 mg/dL   Albumin/Creatinine Ratio, Urine, POC 30-300   Renal function panel     Status: Abnormal   Collection Time: 03/27/24 10:33 AM  Result Value Ref Range   Sodium 139 135 - 145 mmol/L   Potassium 4.2 3.5 - 5.1 mmol/L   Chloride 104 98 - 111 mmol/L   CO2 26 22 - 32 mmol/L   Glucose, Bld 77 70 - 99 mg/dL    Comment: Glucose reference range applies only to samples taken after fasting for at least 8 hours.   BUN 14 8 - 23 mg/dL   Creatinine, Ser 8.79 (H) 0.44 - 1.00 mg/dL   Calcium  9.3 8.9 - 10.3 mg/dL   Phosphorus 3.9 2.5 - 4.6 mg/dL   Albumin 3.6 3.5 - 5.0 g/dL   GFR, Estimated 51 (L) >60 mL/min    Comment: (NOTE) Calculated using the CKD-EPI Creatinine Equation (2021)    Anion gap 9 5 - 15    Comment: Performed at [facility], [address] [place], [place] [address]  Angiotensin converting enzyme     Status: None   Collection Time: 03/27/24 10:33 AM  Result Value Ref Range   Angiotensin-Converting Enzyme 47 14 - 82 U/L    Comment: (NOTE) Performed At: [facility] [address] [place], [place] [id number] Jennette Shorter MD Ey:[phone]   Lipase, blood     Status: None   Collection Time: 04/16/24  4:41 PM  Result Value Ref Range   Lipase 51 11 - 51 U/L    Comment: Performed at [facility], [address], [place], [place] [address]  Comprehensive metabolic panel     Status: Abnormal   Collection Time: 04/16/24  4:41 PM  Result  Value Ref Range   Sodium 136 135 - 145 mmol/L   Potassium 5.5 (H) 3.5 - 5.1 mmol/L   Chloride 101 98 - 111 mmol/L   CO2 20 (L) 22 - 32 mmol/L   Glucose, Bld 188 (H) 70 - 99 mg/dL    Comment: Glucose reference range applies only to samples taken after fasting for at least 8 hours.   BUN 21 8 - 23 mg/dL   Creatinine, Ser 8.71 (H) 0.44 - 1.00 mg/dL   Calcium  10.1 8.9 - 10.3 mg/dL   Total  Protein 7.3 6.5 - 8.1 g/dL   Albumin 3.9 3.5 - 5.0 g/dL   AST 31 15 - 41 U/L   ALT 21 0 - 44 U/L   Alkaline Phosphatase 59 38 - 126 U/L   Total Bilirubin 1.2 0.0 - 1.2 mg/dL   GFR, Estimated 47 (L) >60 mL/min    Comment: (NOTE) Calculated using the CKD-EPI Creatinine Equation (2021)    Anion gap 15 5 - 15    Comment: Performed at [facility], [address] [place], [place] [address]  CBC     Status: Abnormal   Collection Time: 04/16/24  4:41 PM  Result Value Ref Range   WBC 9.4 4.0 - 10.5 K/uL   RBC 5.06 3.87 - 5.11 MIL/uL   Hemoglobin 15.9 (H) 12.0 - 15.0 g/dL   HCT 54.5 63.9 - 53.9 %   MCV 89.7 80.0 - 100.0 fL   MCH 31.4 26.0 - 34.0 pg   MCHC 35.0 30.0 - 36.0 g/dL   RDW 87.1 88.4 - 84.4 %   Platelets 249 150 - 400 K/uL   nRBC 0.0 0.0 - 0.2 %    Comment: Performed at [facility], [address] [place], [place] [address]  Urinalysis, Routine w reflex microscopic -Urine, Clean Catch     Status: Abnormal   Collection Time: 04/16/24  6:50 PM  Result Value Ref Range   Color, Urine Jamario Colina (A) YELLOW    Comment: BIOCHEMICALS MAY BE AFFECTED BY COLOR   APPearance CLOUDY (A) CLEAR   Specific Gravity, Urine 1.031 (H) 1.005 - 1.030   pH 5.0 5.0 - 8.0   Glucose, UA NEGATIVE NEGATIVE mg/dL   Hgb urine dipstick NEGATIVE NEGATIVE   Bilirubin Urine MODERATE (A) NEGATIVE   Ketones, ur 20 (A) NEGATIVE mg/dL   Protein, ur 899 (A) NEGATIVE mg/dL   Nitrite NEGATIVE NEGATIVE   Leukocytes,Ua MODERATE (A) NEGATIVE   RBC / HPF 0-5 0 - 5 RBC/hpf   WBC, UA >50 0 - 5 WBC/hpf   Bacteria, UA MANY (A) NONE SEEN   Squamous Epithelial / HPF 11-20 0 - 5 /HPF   Mucus PRESENT    Hyaline Casts, UA PRESENT     Comment: Performed at [facility], [address] [place], [place] [address]  CBG monitoring, ED     Status: Abnormal   Collection Time: 04/18/24 10:54 PM  Result Value Ref Range   Glucose-Capillary 157 (H) 70 - 99 mg/dL    Comment: Glucose reference range  applies only to samples taken after fasting for at least 8 hours.  Comprehensive metabolic panel     Status: Abnormal   Collection Time: 04/18/24 11:03 PM  Result Value Ref Range   Sodium 136 135 - 145 mmol/L   Potassium 4.2 3.5 - 5.1 mmol/L   Chloride 101 98 - 111 mmol/L   CO2 19 (L) 22 - 32 mmol/L   Glucose, Bld 166 (  H) 70 - 99 mg/dL    Comment: Glucose reference range applies only to samples taken after fasting for at least 8 hours.   BUN 17 8 - 23 mg/dL   Creatinine, Ser 8.73 (H) 0.44 - 1.00 mg/dL   Calcium  9.1 8.9 - 10.3 mg/dL   Total Protein 7.0 6.5 - 8.1 g/dL   Albumin 3.8 3.5 - 5.0 g/dL   AST 26 15 - 41 U/L   ALT 16 0 - 44 U/L   Alkaline Phosphatase 59 38 - 126 U/L   Total Bilirubin 0.6 0.0 - 1.2 mg/dL   GFR, Estimated 48 (L) >60 mL/min    Comment: (NOTE) Calculated using the CKD-EPI Creatinine Equation (2021)    Anion gap 16 (H) 5 - 15    Comment: Performed at [facility], [address] [place], [place] [address]  CBC with Differential     Status: None   Collection Time: 04/18/24 11:03 PM  Result Value Ref Range   WBC 9.7 4.0 - 10.5 K/uL   RBC 4.66 3.87 - 5.11 MIL/uL   Hemoglobin 14.7 12.0 - 15.0 g/dL   HCT 57.3 63.9 - 53.9 %   MCV 91.4 80.0 - 100.0 fL   MCH 31.5 26.0 - 34.0 pg   MCHC 34.5 30.0 - 36.0 g/dL   RDW 87.0 88.4 - 84.4 %   Platelets 216 150 - 400 K/uL   nRBC 0.0 0.0 - 0.2 %   Neutrophils Relative % 70 %   Neutro Abs 6.8 1.7 - 7.7 K/uL   Lymphocytes Relative 21 %   Lymphs Abs 2.1 0.7 - 4.0 K/uL   Monocytes Relative 8 %   Monocytes Absolute 0.8 0.1 - 1.0 K/uL   Eosinophils Relative 1 %   Eosinophils Absolute 0.1 0.0 - 0.5 K/uL   Basophils Relative 0 %   Basophils Absolute 0.0 0.0 - 0.1 K/uL   Immature Granulocytes 0 %   Abs Immature Granulocytes 0.03 0.00 - 0.07 K/uL    Comment: Performed at [facility], [address] [place], [place] [address]  Troponin I (High Sensitivity)     Status: None   Collection Time: 04/18/24  11:03 PM  Result Value Ref Range   Troponin I (High Sensitivity) 17 <18 ng/L    Comment: (NOTE) Elevated high sensitivity troponin I (hsTnI) values and significant  changes across serial measurements may suggest ACS but many other  chronic and acute conditions are known to elevate hsTnI results.  Refer to the Links section for chest pain algorithms and additional  guidance. Performed at [facility], [address] [place], [place] [address]   Lipase, blood     Status: Abnormal   Collection Time: 04/18/24 11:19 PM  Result Value Ref Range   Lipase 67 (H) 11 - 51 U/L    Comment: Performed at [facility], [address] [place], [place] [address]  Lactic acid, plasma     Status: None   Collection Time: 04/18/24 11:36 PM  Result Value Ref Range   Lactic Acid, Venous 1.9 0.5 - 1.9 mmol/L    Comment: Performed at [facility], [address] [place], [place] [address]  Blood culture (routine single)     Status: None   Collection Time: 04/18/24 11:36 PM   Specimen: BLOOD  Result Value Ref Range   Specimen Description BLOOD BLOOD LEFT ARM    Special Requests      BOTTLES DRAWN AEROBIC ONLY Blood Culture adequate volume  Culture      NO GROWTH 5 DAYS Performed at [facility], [address] [place], [place] [address]    Report Status 04/23/2024 FINAL   Urinalysis, w/ Reflex to Culture (Infection Suspected) -Urine, Clean Catch     Status: Abnormal   Collection Time: 04/19/24 12:41 AM  Result Value Ref Range   Specimen Source URINE, RANDOM     Comment: CORRECTED ON 08/22 AT 0101: PREVIOUSLY REPORTED AS URINE, CLEAN CATCH   Color, Urine YELLOW (A) YELLOW   APPearance HAZY (A) CLEAR   Specific Gravity, Urine 1.010 1.005 - 1.030   pH 5.0 5.0 - 8.0   Glucose, UA 50 (A) NEGATIVE mg/dL   Hgb urine dipstick NEGATIVE NEGATIVE   Bilirubin Urine NEGATIVE NEGATIVE   Ketones, ur 80 (A) NEGATIVE mg/dL   Protein, ur NEGATIVE NEGATIVE mg/dL    Nitrite NEGATIVE NEGATIVE   Leukocytes,Ua LARGE (A) NEGATIVE   RBC / HPF 0-5 0 - 5 RBC/hpf   WBC, UA >50 0 - 5 WBC/hpf    Comment:        Reflex urine culture not performed if WBC <=10, OR if Squamous epithelial cells >5. If Squamous epithelial cells >5 suggest recollection.    Bacteria, UA RARE (A) NONE SEEN   Squamous Epithelial / HPF 6-10 0 - 5 /HPF   Mucus PRESENT    Hyaline Casts, UA PRESENT     Comment: Performed at [facility], [address], [place], [place] [address]  Urine Culture (for pregnant, neutropenic or urologic patients or patients with an indwelling urinary catheter)     Status: Abnormal   Collection Time: 04/19/24 12:41 AM   Specimen: Urine, Clean Catch  Result Value Ref Range   Specimen Description      URINE, CLEAN CATCH Performed at [facility], [address], [place], [place] [address]    Special Requests      NONE Performed at [facility], [address], [place], [place] [address]    Culture (A)     <10,000 COLONIES/mL INSIGNIFICANT GROWTH Performed at [facility], [address] [place], [place] [address]    Report Status 04/20/2024 FINAL   Culture, blood (single)     Status: None   Collection Time: 04/19/24  4:09 AM   Specimen: BLOOD  Result Value Ref Range   Specimen Description BLOOD BLOOD LEFT ARM    Special Requests      BOTTLES DRAWN AEROBIC ONLY Blood Culture adequate volume   Culture      NO GROWTH 5 DAYS Performed at [facility], [address] [place], [place] [address]    Report Status 04/24/2024 FINAL   HIV Antibody (routine testing w rflx)     Status: None   Collection Time: 04/19/24  5:28 AM  Result Value Ref Range   HIV Screen 4th Generation wRfx Non Reactive Non Reactive    Comment: Performed at [facility], [address] [place], [place] [address]  D-dimer, quantitative     Status: Abnormal   Collection Time: 04/19/24  5:28 AM  Result Value Ref Range    D-Dimer, Quant 0.85 (H) 0.00 - 0.50 ug/mL-FEU    Comment: (NOTE) At the manufacturer cut-off value of 0.5 g/mL FEU, this assay has a negative predictive value of 95-100%.This assay is intended for use in conjunction with a clinical pretest probability (PTP) assessment model to exclude pulmonary embolism (PE) and deep venous thrombosis (DVT) in outpatients  suspected of PE or DVT. Results should be correlated with clinical presentation. Performed at [facility], [address] [place], [place] [address]   Thyroid  Panel With TSH     Status: None   Collection Time: 04/19/24  5:28 AM  Result Value Ref Range   TSH 1.080 0.450 - 4.500 uIU/mL   T4, Total 7.1 4.5 - 12.0 ug/dL   T3 Uptake Ratio 35 24 - 39 %   Free Thyroxine Index 2.5 1.2 - 4.9    Comment: (NOTE) Performed At: [facility] [address] [place], [place] [id number] Jennette Shorter MD Ey:[id number]   T4, free     Status: Abnormal   Collection Time: 04/19/24  5:28 AM  Result Value Ref Range   Free T4 1.18 (H) 0.61 - 1.12 ng/dL    Comment: (NOTE) Biotin ingestion may interfere with free T4 tests. If the results are inconsistent with the TSH level, previous test results, or the clinical presentation, then consider biotin interference. If needed, order repeat testing after stopping biotin. Performed at [facility], [address] [place], [place] [address]   CBG monitoring, ED     Status: Abnormal   Collection Time: 04/19/24  7:27 AM  Result Value Ref Range   Glucose-Capillary 149 (H) 70 - 99 mg/dL    Comment: Glucose reference range applies only to samples taken after fasting for at least 8 hours.   Comment 1 Notify RN    Comment 2 Document in Chart   CBG monitoring, ED     Status: Abnormal   Collection Time: 04/19/24 12:38 PM  Result Value Ref Range   Glucose-Capillary 122 (H) 70 - 99 mg/dL    Comment: Glucose reference range applies only to samples taken after fasting for at least 8  hours.  Glucose, capillary     Status: Abnormal   Collection Time: 04/19/24  4:37 PM  Result Value Ref Range   Glucose-Capillary 125 (H) 70 - 99 mg/dL    Comment: Glucose reference range applies only to samples taken after fasting for at least 8 hours.  Glucose, capillary     Status: Abnormal   Collection Time: 04/19/24 10:41 PM  Result Value Ref Range   Glucose-Capillary 158 (H) 70 - 99 mg/dL    Comment: Glucose reference range applies only to samples taken after fasting for at least 8 hours.  Basic metabolic panel     Status: Abnormal   Collection Time: 04/20/24  5:49 AM  Result Value Ref Range   Sodium 140 135 - 145 mmol/L   Potassium 3.7 3.5 - 5.1 mmol/L   Chloride 110 98 - 111 mmol/L   CO2 24 22 - 32 mmol/L   Glucose, Bld 98 70 - 99 mg/dL    Comment: Glucose reference range applies only to samples taken after fasting for at least 8 hours.   BUN 8 8 - 23 mg/dL   Creatinine, Ser 8.95 (H) 0.44 - 1.00 mg/dL   Calcium  8.5 (L) 8.9 - 10.3 mg/dL   GFR, Estimated >39 >39 mL/min    Comment: (NOTE) Calculated using the CKD-EPI Creatinine Equation (2021)    Anion gap 6 5 - 15    Comment: Performed at [facility], [address] [place], [place] [address]  CBC     Status: Abnormal   Collection Time: 04/20/24  5:49 AM  Result Value Ref Range   WBC 4.7 4.0 - 10.5 K/uL   RBC 3.89 3.87 - 5.11 MIL/uL  Hemoglobin 12.3 12.0 - 15.0 g/dL   HCT 64.6 (L) 63.9 - 53.9 %   MCV 90.7 80.0 - 100.0 fL   MCH 31.6 26.0 - 34.0 pg   MCHC 34.8 30.0 - 36.0 g/dL   RDW 86.8 88.4 - 84.4 %   Platelets 164 150 - 400 K/uL   nRBC 0.0 0.0 - 0.2 %    Comment: Performed at [facility], [address] [place], [place] [address]  Glucose, capillary     Status: Abnormal   Collection Time: 04/20/24  7:49 AM  Result Value Ref Range   Glucose-Capillary 102 (H) 70 - 99 mg/dL    Comment: Glucose reference range applies only to samples taken after fasting for at least 8 hours.   Comment 1  Notify RN    Comment 2 Document in Chart   POCT Urinalysis Dipstick (18997)     Status: Abnormal   Collection Time: 05/02/24 10:51 AM  Result Value Ref Range   Color, UA     Clarity, UA     Glucose, UA Negative Negative   Bilirubin, UA Negative    Ketones, UA Negative    Spec Grav, UA 1.020 1.010 - 1.025   Blood, UA Negative    pH, UA 6.0 5.0 - 8.0   Protein, UA Negative Negative   Urobilinogen, UA 0.2 0.2 or 1.0 E.U./dL   Nitrite, UA Negative    Leukocytes, UA Moderate (2+) (A) Negative   Appearance     Odor    Urine Culture     Status: None   Collection Time: 05/02/24 11:14 AM   Specimen: Urine   UR  Result Value Ref Range   Urine Culture, Routine Final report    Organism ID, Bacteria Comment     Comment: Mixed urogenital flora 10,000-25,000 colony forming units per mL   CBC with Differential/Platelet     Status: Abnormal   Collection Time: 05/02/24 11:15 AM  Result Value Ref Range   WBC 6.0 3.4 - 10.8 x10E3/uL   RBC 3.93 3.77 - 5.28 x10E6/uL   Hemoglobin 12.3 11.1 - 15.9 g/dL   Hematocrit 61.6 65.9 - 46.6 %   MCV 98 (H) 79 - 97 fL   MCH 31.3 26.6 - 33.0 pg   MCHC 32.1 31.5 - 35.7 g/dL   RDW 86.8 88.2 - 84.5 %   Platelets 224 150 - 450 x10E3/uL   Neutrophils 42 Not Estab. %   Lymphs 41 Not Estab. %   Monocytes 14 Not Estab. %   Eos 2 Not Estab. %   Basos 1 Not Estab. %   Neutrophils Absolute 2.6 1.4 - 7.0 x10E3/uL   Lymphocytes Absolute 2.5 0.7 - 3.1 x10E3/uL   Monocytes Absolute 0.8 0.1 - 0.9 x10E3/uL   EOS (ABSOLUTE) 0.1 0.0 - 0.4 x10E3/uL   Basophils Absolute 0.0 0.0 - 0.2 x10E3/uL   Immature Granulocytes 0 Not Estab. %   Immature Grans (Abs) 0.0 0.0 - 0.1 x10E3/uL  CMP14+EGFR     Status: Abnormal   Collection Time: 05/02/24 11:15 AM  Result Value Ref Range   Glucose 105 (H) 70 - 99 mg/dL   BUN 16 8 - 27 mg/dL   Creatinine, Ser 8.71 (H) 0.57 - 1.00 mg/dL   eGFR 47 (L) >40 fO/fpw/8.26   BUN/Creatinine Ratio 13 12 - 28   Sodium 140 134 - 144 mmol/L    Potassium 5.1 3.5 - 5.2 mmol/L   Chloride 102 96 - 106 mmol/L   CO2  25 20 - 29 mmol/L   Calcium  9.9 8.7 - 10.3 mg/dL   Total Protein 5.8 (L) 6.0 - 8.5 g/dL   Albumin 3.8 (L) 3.9 - 4.9 g/dL   Globulin, Total 2.0 1.5 - 4.5 g/dL   Bilirubin Total 0.3 0.0 - 1.2 mg/dL   Alkaline Phosphatase 54 44 - 121 IU/L    Comment: **Effective May 13, 2024 Alkaline Phosphatase**   reference interval will be changing to:              Age                Female          Female           0 -  5 days         47 - 127       47 - 127           6 - 10 days         29 - 242       29 - 242          11 - 20 days        109 - 357      109 - 357          21 - 30 days         94 - 494       94 - 494           1 -  2 months      149 - 539      149 - 539           3 -  6 months      131 - 452      131 - 452           7 - 11 months      117 - 401      117 - 401   12 months -  6 years       158 - 369      158 - 369           7 - 12 years       150 - 409      150 - 409               13 years       156 - 435       78 - 227               14 years       114 - 375       64 - 161               15 years        88 - 279       56 - 134               16 years        74 - 207       51 - 121               17 years        63 - 161       47 - 113          18 - 20 years        51 - 125  42 - 106          21 - 50 years         47 - 123       41 - 116          51 - 80 years        49 - 135       51 - 125              >80 years        48 - 129       48 - 129    AST 14 0 - 40 IU/L   ALT 13 0 - 32 IU/L  POCT Urinalysis Dipstick (18997)     Status: Normal   Collection Time: 05/13/24  1:56 PM  Result Value Ref Range   Color, UA     Clarity, UA     Glucose, UA Negative Negative   Bilirubin, UA Negative    Ketones, UA Negative    Spec Grav, UA 1.020 1.010 - 1.025   Blood, UA Negative    pH, UA 6.0 5.0 - 8.0   Protein, UA Negative Negative   Urobilinogen, UA 0.2 0.2 or 1.0 E.U./dL   Nitrite, UA Negative    Leukocytes, UA  Negative Negative   Appearance     Odor    POCT Rapid Influenza A&B     Status: Normal   Collection Time: 05/13/24  2:14 PM  Result Value Ref Range   FLU A Negative    FLU B Negative       Assessment & Plan:  Urine culture Symptom management  Problem List Items Addressed This Visit       Other   Flank pain - Primary   Relevant Orders   POCT Urinalysis Dipstick (18997) (Completed)   Urine Culture   Other Visit Diagnoses       Sick       Relevant Orders   POCT Rapid Influenza A&B (Completed)       Return if symptoms worsen or fail to improve.   Total time spent: 25 minutes  [employer], NP  05/13/2024   This document may have been prepared by Dragon Voice Recognition software and as such may include unintentional dictation errors.

## 2024-05-14 ENCOUNTER — Ambulatory Visit: Admitting: Pulmonary Disease

## 2024-05-14 ENCOUNTER — Encounter

## 2024-05-15 LAB — URINE CULTURE

## 2024-05-16 ENCOUNTER — Ambulatory Visit: Admitting: Family

## 2024-05-16 ENCOUNTER — Ambulatory Visit
Admission: RE | Admit: 2024-05-16 | Discharge: 2024-05-16 | Disposition: A | Discharge status: Home / Self Care | Source: Ambulatory Visit | Attending: Family | Admitting: Family

## 2024-05-16 ENCOUNTER — Encounter: Payer: Self-pay | Admitting: Family

## 2024-05-16 VITALS — BP 136/74 | HR 80 | Ht 63.0 in | Wt 156.2 lb

## 2024-05-16 DIAGNOSIS — E785 Hyperlipidemia, unspecified: Secondary | ICD-10-CM

## 2024-05-16 DIAGNOSIS — R0781 Pleurodynia: Secondary | ICD-10-CM | POA: Diagnosis present

## 2024-05-16 DIAGNOSIS — Z794 Long term (current) use of insulin: Secondary | ICD-10-CM

## 2024-05-16 DIAGNOSIS — M25552 Pain in left hip: Secondary | ICD-10-CM | POA: Insufficient documentation

## 2024-05-16 DIAGNOSIS — E559 Vitamin D deficiency, unspecified: Secondary | ICD-10-CM

## 2024-05-16 DIAGNOSIS — N1831 Chronic kidney disease, stage 3a: Secondary | ICD-10-CM

## 2024-05-16 DIAGNOSIS — E1169 Type 2 diabetes mellitus with other specified complication: Secondary | ICD-10-CM

## 2024-05-16 DIAGNOSIS — I10 Essential (primary) hypertension: Secondary | ICD-10-CM | POA: Diagnosis not present

## 2024-05-16 DIAGNOSIS — E1140 Type 2 diabetes mellitus with diabetic neuropathy, unspecified: Secondary | ICD-10-CM

## 2024-05-16 DIAGNOSIS — E538 Deficiency of other specified B group vitamins: Secondary | ICD-10-CM

## 2024-05-16 MED ORDER — KERENDIA 10 MG PO TABS: 1.0000 | ORAL_TABLET | Freq: Every day | ORAL | 3 refills | Status: AC

## 2024-05-16 NOTE — Assessment & Plan Note (Signed)
 Checking labs today. Will call pt. With results  Continue current diabetes POC, as patient has been well controlled on current regimen.  Will adjust meds if needed based on labs.   -CBC w/Diff -CMP w/eGFR -Hemoglobin A1C

## 2024-05-16 NOTE — Progress Notes (Signed)
Pt informed

## 2024-05-16 NOTE — Assessment & Plan Note (Signed)
 Checking labs today.  Will continue supplements as needed.   - Vitamin D  - Vitamin B12 - TSH

## 2024-05-16 NOTE — Progress Notes (Signed)
 Established Patient Office Visit  Subjective:  Patient ID: Renee Bush, female    DOB: 11-29-1960  Age: 63 y.o. MRN: [id number]  Chief Complaint  Patient presents with   Follow-up    3 month follow up, discuss lab results    Patient is here today for her 3 months follow up.  She has been feeling fairly well since last appointment.   She does have additional concerns to discuss today.  Labs are due today.  She needs refills.   I have reviewed her active problem list, medication list, allergies, health maintenance, notes from last encounter, lab results for her appointment today.      No other concerns at this time.   Past Medical History:  Diagnosis Date   AKI (acute kidney injury) (HCC) 07/07/2022   Antibiotic-induced yeast infection 01/09/2020   Balance problem 12/18/2019   Blood in stool 12/28/2021   Chest pain 10/14/2021   Chronic kidney disease, stage III (moderate) (HCC) 10/06/2017   Constipation 07/13/2021   Diabetes mellitus without complication (HCC)    DKA, type 2 (HCC) 04/16/2022   ETOH abuse 04/18/2015   Family history of colonic polyps    GERD (gastroesophageal reflux disease)    History of allergy 06/11/2019   Hospital discharge follow-up 01/06/2022   Hypercholesteremia    Hypertension 09/10/2015   Hypokalemia 04/17/2022   Palpitations 10/28/2021   Persistent dry cough 11/16/2021   Sarcoidosis 10/12/2021   self-reported   Shortness of breath 10/28/2021   Urinary frequency 07/02/2020   Urinary tract infection 11/16/2021    Past Surgical History:  Procedure Laterality Date   APPENDECTOMY     CESAREAN SECTION  1991   COLONOSCOPY WITH PROPOFOL  N/A 06/03/2019   Procedure: COLONOSCOPY WITH PROPOFOL ;  Surgeon: Therisa Bi, MD;  Location: [facility];  Service: Gastroenterology;  Laterality: N/A;    Social History   Socioeconomic History   Marital status: Married    Spouse name: Gaither   Number of children: 4   Years of education: Not  on file   Highest education level: 11th grade  Occupational History   Occupation: na  Tobacco Use   Smoking status: Former    Current packs/day: 0.00    Average packs/day: 1 pack/day for 5.0 years (5.0 ttl pk-yrs)    Types: Cigarettes    Start date: 04/17/2000    Quit date: 04/17/2005    Years since quitting: 19.0   Smokeless tobacco: Never  Vaping Use   Vaping status: Never Used  Substance and Sexual Activity   Alcohol use: Not Currently    Alcohol/week: 14.0 standard drinks of alcohol    Types: 14 Cans of beer per week    Comment: quit ~2020   Drug use: No   Sexual activity: Not Currently  Other Topics Concern   Not on file  Social History Narrative   Lives in mobile home paid for   Social Drivers of Health   Financial Resource Strain: Low Risk  (05/19/2020)   Overall Financial Resource Strain (CARDIA)    Difficulty of Paying Living Expenses: Not hard at all  Food Insecurity: No Food Insecurity (04/19/2024)   Hunger Vital Sign    Worried About Running Out of Food in the Last Year: Never true    Ran Out of Food in the Last Year: Never true  Transportation Needs: No Transportation Needs (04/19/2024)   PRAPARE - Administrator, Civil Service (Medical): No    Lack of Transportation (Non-Medical): No  Physical Activity: Insufficiently Active (05/19/2020)   Exercise Vital Sign    Days of Exercise per Week: 7 days    Minutes of Exercise per Session: 10 min  Stress: Stress Concern Present (05/28/2020)   [employer] of Occupational Health - Occupational Stress Questionnaire    Feeling of Stress : To some extent  Social Connections: Moderately Isolated (05/28/2020)   Social Connection and Isolation Panel    Frequency of Communication with Friends and Family: More than three times a week    Frequency of Social Gatherings with Friends and Family: More than three times a week    Attends Religious Services: Never    Database administrator or Organizations: No     Attends Banker Meetings: Never    Marital Status: Married  Catering manager Violence: Not At Risk (04/19/2024)   Humiliation, Afraid, Rape, and Kick questionnaire    Fear of Current or Ex-Partner: No    Emotionally Abused: No    Physically Abused: No    Sexually Abused: No    Family History  Problem Relation Age of Onset   COPD Mother    Alzheimer's disease Mother    Hypertension Father    Hyperlipidemia Father    Heart attack Father    Diabetes type II Father    Lupus Father    Diabetes type II Sister    Diabetes type II Sister    Diabetes type II Sister    Diabetes type II Sister    Gestational diabetes Daughter     Allergies  Allergen Reactions   Etodolac Rash   Penicillin G Rash   Penicillins Rash    Has patient had a PCN reaction causing immediate rash, facial/tongue/throat swelling, SOB or lightheadedness with hypotension: Yes  Has patient had a PCN reaction causing severe rash involving mucus membranes or skin necrosis: No  Has patient had a PCN reaction that required hospitalization: No  Has patient had a PCN reaction occurring within the last 10 years: No  If all of the above answers are NO, then may proceed with Cephalosporin use.  Has patient had a PCN reaction causing immediate rash, facial/tongue/throat swelling, SOB or lightheadedness with hypotension: Yes Has patient had a PCN reaction causing severe rash involving mucus membranes or skin necrosis: No Has patient had a PCN reaction that required hospitalization: No Has patient had a PCN reaction occurring within the last 10 years: No If all of the above answers are NO, then may proceed with Cephalosporin use.    Review of Systems  All other systems reviewed and are negative.      Objective:   BP 136/74   Pulse 80   Ht 5' 3 (1.6 m)   Wt 156 lb 3.2 oz (70.9 kg)   SpO2 99%   BMI 27.67 kg/m   Vitals:   05/16/24 0946  BP: 136/74  Pulse: 80  Height: 5' 3 (1.6 m)  Weight: 156  lb 3.2 oz (70.9 kg)  SpO2: 99%  BMI (Calculated): 27.68    Physical Exam Vitals and nursing note reviewed.  Constitutional:      Appearance: Normal appearance. She is normal weight.  HENT:     Head: Normocephalic.     Right Ear: Tympanic membrane normal.     Left Ear: Tympanic membrane normal.  Eyes:     Extraocular Movements: Extraocular movements intact.     Conjunctiva/sclera: Conjunctivae normal.     Pupils: Pupils are equal, round, and reactive to light.  Cardiovascular:     Rate and Rhythm: Normal rate and regular rhythm.     Pulses: Normal pulses.     Heart sounds: Normal heart sounds.  Pulmonary:     Effort: Pulmonary effort is normal.  Musculoskeletal:     Cervical back: Normal range of motion.  Neurological:     General: No focal deficit present.     Mental Status: She is alert and oriented to person, place, and time. Mental status is at baseline.  Psychiatric:        Mood and Affect: Mood normal.        Behavior: Behavior normal.        Thought Content: Thought content normal.        Judgment: Judgment normal.      No results found for any visits on 05/16/24.  Recent Results (from the past 2160 hours)  Renal function panel     Status: Abnormal   Collection Time: 03/27/24 10:33 AM  Result Value Ref Range   Sodium 139 135 - 145 mmol/L   Potassium 4.2 3.5 - 5.1 mmol/L   Chloride 104 98 - 111 mmol/L   CO2 26 22 - 32 mmol/L   Glucose, Bld 77 70 - 99 mg/dL    Comment: Glucose reference range applies only to samples taken after fasting for at least 8 hours.   BUN 14 8 - 23 mg/dL   Creatinine, Ser 8.79 (H) 0.44 - 1.00 mg/dL   Calcium  9.3 8.9 - 10.3 mg/dL   Phosphorus 3.9 2.5 - 4.6 mg/dL   Albumin 3.6 3.5 - 5.0 g/dL   GFR, Estimated 51 (L) >60 mL/min    Comment: (NOTE) Calculated using the CKD-EPI Creatinine Equation (2021)    Anion gap 9 5 - 15    Comment: Performed at [facility], [address] [place], [place] [address]  Angiotensin  converting enzyme     Status: None   Collection Time: 03/27/24 10:33 AM  Result Value Ref Range   Angiotensin-Converting Enzyme 47 14 - 82 U/L    Comment: (NOTE) Performed At: [facility] [address] [place], [place] [id number] Jennette Shorter MD Ey:[id number]   Lipase, blood     Status: None   Collection Time: 04/16/24  4:41 PM  Result Value Ref Range   Lipase 51 11 - 51 U/L    Comment: Performed at [facility], [address] [place], [place] [address]  Comprehensive metabolic panel     Status: Abnormal   Collection Time: 04/16/24  4:41 PM  Result Value Ref Range   Sodium 136 135 - 145 mmol/L   Potassium 5.5 (H) 3.5 - 5.1 mmol/L   Chloride 101 98 - 111 mmol/L   CO2 20 (L) 22 - 32 mmol/L   Glucose, Bld 188 (H) 70 - 99 mg/dL    Comment: Glucose reference range applies only to samples taken after fasting for at least 8 hours.   BUN 21 8 - 23 mg/dL   Creatinine, Ser 8.71 (H) 0.44 - 1.00 mg/dL   Calcium  10.1 8.9 - 10.3 mg/dL   Total Protein 7.3 6.5 - 8.1 g/dL   Albumin 3.9 3.5 - 5.0 g/dL   AST 31 15 - 41 U/L   ALT 21 0 - 44 U/L   Alkaline Phosphatase 59 38 - 126 U/L   Total Bilirubin 1.2 0.0 - 1.2 mg/dL   GFR, Estimated 47 (L) >60 mL/min    Comment: (NOTE) Calculated using the CKD-EPI Creatinine  Equation (2021)    Anion gap 15 5 - 15    Comment: Performed at [facility], [address] [place], [place] [address]  CBC     Status: Abnormal   Collection Time: 04/16/24  4:41 PM  Result Value Ref Range   WBC 9.4 4.0 - 10.5 K/uL   RBC 5.06 3.87 - 5.11 MIL/uL   Hemoglobin 15.9 (H) 12.0 - 15.0 g/dL   HCT 54.5 63.9 - 53.9 %   MCV 89.7 80.0 - 100.0 fL   MCH 31.4 26.0 - 34.0 pg   MCHC 35.0 30.0 - 36.0 g/dL   RDW 87.1 88.4 - 84.4 %   Platelets 249 150 - 400 K/uL   nRBC 0.0 0.0 - 0.2 %    Comment: Performed at [facility], [address] [place], [place] [address]  Urinalysis, Routine w reflex microscopic -Urine, Clean Catch      Status: Abnormal   Collection Time: 04/16/24  6:50 PM  Result Value Ref Range   Color, Urine AMBER (A) YELLOW    Comment: BIOCHEMICALS MAY BE AFFECTED BY COLOR   APPearance CLOUDY (A) CLEAR   Specific Gravity, Urine 1.031 (H) 1.005 - 1.030   pH 5.0 5.0 - 8.0   Glucose, UA NEGATIVE NEGATIVE mg/dL   Hgb urine dipstick NEGATIVE NEGATIVE   Bilirubin Urine MODERATE (A) NEGATIVE   Ketones, ur 20 (A) NEGATIVE mg/dL   Protein, ur 899 (A) NEGATIVE mg/dL   Nitrite NEGATIVE NEGATIVE   Leukocytes,Ua MODERATE (A) NEGATIVE   RBC / HPF 0-5 0 - 5 RBC/hpf   WBC, UA >50 0 - 5 WBC/hpf   Bacteria, UA MANY (A) NONE SEEN   Squamous Epithelial / HPF 11-20 0 - 5 /HPF   Mucus PRESENT    Hyaline Casts, UA PRESENT     Comment: Performed at [facility], [address] [place], [place] [address]  CBG monitoring, ED     Status: Abnormal   Collection Time: 04/18/24 10:54 PM  Result Value Ref Range   Glucose-Capillary 157 (H) 70 - 99 mg/dL    Comment: Glucose reference range applies only to samples taken after fasting for at least 8 hours.  Comprehensive metabolic panel     Status: Abnormal   Collection Time: 04/18/24 11:03 PM  Result Value Ref Range   Sodium 136 135 - 145 mmol/L   Potassium 4.2 3.5 - 5.1 mmol/L   Chloride 101 98 - 111 mmol/L   CO2 19 (L) 22 - 32 mmol/L   Glucose, Bld 166 (H) 70 - 99 mg/dL    Comment: Glucose reference range applies only to samples taken after fasting for at least 8 hours.   BUN 17 8 - 23 mg/dL   Creatinine, Ser 8.73 (H) 0.44 - 1.00 mg/dL   Calcium  9.1 8.9 - 10.3 mg/dL   Total Protein 7.0 6.5 - 8.1 g/dL   Albumin 3.8 3.5 - 5.0 g/dL   AST 26 15 - 41 U/L   ALT 16 0 - 44 U/L   Alkaline Phosphatase 59 38 - 126 U/L   Total Bilirubin 0.6 0.0 - 1.2 mg/dL   GFR, Estimated 48 (L) >60 mL/min    Comment: (NOTE) Calculated using the CKD-EPI Creatinine Equation (2021)    Anion gap 16 (H) 5 - 15    Comment: Performed at [facility], [address]  [place], [place] [address]  CBC with Differential     Status: None   Collection Time:  04/18/24 11:03 PM  Result Value Ref Range   WBC 9.7 4.0 - 10.5 K/uL   RBC 4.66 3.87 - 5.11 MIL/uL   Hemoglobin 14.7 12.0 - 15.0 g/dL   HCT 57.3 63.9 - 53.9 %   MCV 91.4 80.0 - 100.0 fL   MCH 31.5 26.0 - 34.0 pg   MCHC 34.5 30.0 - 36.0 g/dL   RDW 87.0 88.4 - 84.4 %   Platelets 216 150 - 400 K/uL   nRBC 0.0 0.0 - 0.2 %   Neutrophils Relative % 70 %   Neutro Abs 6.8 1.7 - 7.7 K/uL   Lymphocytes Relative 21 %   Lymphs Abs 2.1 0.7 - 4.0 K/uL   Monocytes Relative 8 %   Monocytes Absolute 0.8 0.1 - 1.0 K/uL   Eosinophils Relative 1 %   Eosinophils Absolute 0.1 0.0 - 0.5 K/uL   Basophils Relative 0 %   Basophils Absolute 0.0 0.0 - 0.1 K/uL   Immature Granulocytes 0 %   Abs Immature Granulocytes 0.03 0.00 - 0.07 K/uL    Comment: Performed at [facility], [address], [place], [place] [address]  Troponin I (High Sensitivity)     Status: None   Collection Time: 04/18/24 11:03 PM  Result Value Ref Range   Troponin I (High Sensitivity) 17 <18 ng/L    Comment: (NOTE) Elevated high sensitivity troponin I (hsTnI) values and significant  changes across serial measurements may suggest ACS but many other  chronic and acute conditions are known to elevate hsTnI results.  Refer to the Links section for chest pain algorithms and additional  guidance. Performed at [facility], [address] [place], [place] [address]   Lipase, blood     Status: Abnormal   Collection Time: 04/18/24 11:19 PM  Result Value Ref Range   Lipase 67 (H) 11 - 51 U/L    Comment: Performed at [facility], [address] [place], [place] [address]  Lactic acid, plasma     Status: None   Collection Time: 04/18/24 11:36 PM  Result Value Ref Range   Lactic Acid, Venous 1.9 0.5 - 1.9 mmol/L    Comment: Performed at [facility], [address] [place], [place] [address]  Blood culture  (routine single)     Status: None   Collection Time: 04/18/24 11:36 PM   Specimen: BLOOD  Result Value Ref Range   Specimen Description BLOOD BLOOD LEFT ARM    Special Requests      BOTTLES DRAWN AEROBIC ONLY Blood Culture adequate volume   Culture      NO GROWTH 5 DAYS Performed at [facility], [address] [place], [place] [address]    Report Status 04/23/2024 FINAL   Urinalysis, w/ Reflex to Culture (Infection Suspected) -Urine, Clean Catch     Status: Abnormal   Collection Time: 04/19/24 12:41 AM  Result Value Ref Range   Specimen Source URINE, RANDOM     Comment: CORRECTED ON 08/22 AT 0101: PREVIOUSLY REPORTED AS URINE, CLEAN CATCH   Color, Urine YELLOW (A) YELLOW   APPearance HAZY (A) CLEAR   Specific Gravity, Urine 1.010 1.005 - 1.030   pH 5.0 5.0 - 8.0   Glucose, UA 50 (A) NEGATIVE mg/dL   Hgb urine dipstick NEGATIVE NEGATIVE   Bilirubin Urine NEGATIVE NEGATIVE   Ketones, ur 80 (A) NEGATIVE mg/dL   Protein, ur NEGATIVE NEGATIVE mg/dL   Nitrite NEGATIVE NEGATIVE   Leukocytes,Ua LARGE (A) NEGATIVE  RBC / HPF 0-5 0 - 5 RBC/hpf   WBC, UA >50 0 - 5 WBC/hpf    Comment:        Reflex urine culture not performed if WBC <=10, OR if Squamous epithelial cells >5. If Squamous epithelial cells >5 suggest recollection.    Bacteria, UA RARE (A) NONE SEEN   Squamous Epithelial / HPF 6-10 0 - 5 /HPF   Mucus PRESENT    Hyaline Casts, UA PRESENT     Comment: Performed at [facility], [address], [place], [place] [address]  Urine Culture (for pregnant, neutropenic or urologic patients or patients with an indwelling urinary catheter)     Status: Abnormal   Collection Time: 04/19/24 12:41 AM   Specimen: Urine, Clean Catch  Result Value Ref Range   Specimen Description      URINE, CLEAN CATCH Performed at [facility], [address], [place], [place] [address]    Special Requests      NONE Performed at [facility], [address] [place], [place] [address]    Culture (A)     <10,000 COLONIES/mL INSIGNIFICANT GROWTH Performed at [facility], [address], [place], [place] [address]    Report Status 04/20/2024 FINAL   Culture, blood (single)     Status: None   Collection Time: 04/19/24  4:09 AM   Specimen: BLOOD  Result Value Ref Range   Specimen Description BLOOD BLOOD LEFT ARM    Special Requests      BOTTLES DRAWN AEROBIC ONLY Blood Culture adequate volume   Culture      NO GROWTH 5 DAYS Performed at [facility], [address] [place], [place] [address]    Report Status 04/24/2024 FINAL   HIV Antibody (routine testing w rflx)     Status: None   Collection Time: 04/19/24  5:28 AM  Result Value Ref Range   HIV Screen 4th Generation wRfx Non Reactive Non Reactive    Comment: Performed at [facility], [address] [place], [place] [address]  D-dimer, quantitative     Status: Abnormal   Collection Time: 04/19/24  5:28 AM  Result Value Ref Range   D-Dimer, Quant 0.85 (H) 0.00 - 0.50 ug/mL-FEU    Comment: (NOTE) At the manufacturer cut-off value of 0.5 g/mL FEU, this assay has a negative predictive value of 95-100%.This assay is intended for use in conjunction with a clinical pretest probability (PTP) assessment model to exclude pulmonary embolism (PE) and deep venous thrombosis (DVT) in outpatients suspected of PE or DVT. Results should be correlated with clinical presentation. Performed at [facility], [address] [place], [place] [address]   Thyroid  Panel With TSH     Status: None   Collection Time: 04/19/24  5:28 AM  Result Value Ref Range   TSH 1.080 0.450 - 4.500 uIU/mL   T4, Total 7.1 4.5 - 12.0 ug/dL   T3 Uptake Ratio 35 24 - 39 %   Free Thyroxine Index 2.5 1.2 - 4.9    Comment: (NOTE) Performed At: [facility] [address] [place], [place] [id number] Jennette Shorter MD Ey:[id number]   T4, free     Status: Abnormal   Collection  Time: 04/19/24  5:28 AM  Result Value Ref Range   Free T4 1.18 (H) 0.61 - 1.12 ng/dL    Comment: (NOTE) Biotin ingestion may interfere with free T4 tests. If the results are inconsistent with the TSH  level, previous test results, or the clinical presentation, then consider biotin interference. If needed, order repeat testing after stopping biotin. Performed at [facility], [address] [place], [place] [address]   CBG monitoring, ED     Status: Abnormal   Collection Time: 04/19/24  7:27 AM  Result Value Ref Range   Glucose-Capillary 149 (H) 70 - 99 mg/dL    Comment: Glucose reference range applies only to samples taken after fasting for at least 8 hours.   Comment 1 Notify RN    Comment 2 Document in Chart   CBG monitoring, ED     Status: Abnormal   Collection Time: 04/19/24 12:38 PM  Result Value Ref Range   Glucose-Capillary 122 (H) 70 - 99 mg/dL    Comment: Glucose reference range applies only to samples taken after fasting for at least 8 hours.  Glucose, capillary     Status: Abnormal   Collection Time: 04/19/24  4:37 PM  Result Value Ref Range   Glucose-Capillary 125 (H) 70 - 99 mg/dL    Comment: Glucose reference range applies only to samples taken after fasting for at least 8 hours.  Glucose, capillary     Status: Abnormal   Collection Time: 04/19/24 10:41 PM  Result Value Ref Range   Glucose-Capillary 158 (H) 70 - 99 mg/dL    Comment: Glucose reference range applies only to samples taken after fasting for at least 8 hours.  Basic metabolic panel     Status: Abnormal   Collection Time: 04/20/24  5:49 AM  Result Value Ref Range   Sodium 140 135 - 145 mmol/L   Potassium 3.7 3.5 - 5.1 mmol/L   Chloride 110 98 - 111 mmol/L   CO2 24 22 - 32 mmol/L   Glucose, Bld 98 70 - 99 mg/dL    Comment: Glucose reference range applies only to samples taken after fasting for at least 8 hours.   BUN 8 8 - 23 mg/dL   Creatinine, Ser 8.95 (H) 0.44 - 1.00 mg/dL   Calcium  8.5  (L) 8.9 - 10.3 mg/dL   GFR, Estimated >39 >39 mL/min    Comment: (NOTE) Calculated using the CKD-EPI Creatinine Equation (2021)    Anion gap 6 5 - 15    Comment: Performed at [facility], [address] [place], [place] [address]  CBC     Status: Abnormal   Collection Time: 04/20/24  5:49 AM  Result Value Ref Range   WBC 4.7 4.0 - 10.5 K/uL   RBC 3.89 3.87 - 5.11 MIL/uL   Hemoglobin 12.3 12.0 - 15.0 g/dL   HCT 64.6 (L) 63.9 - 53.9 %   MCV 90.7 80.0 - 100.0 fL   MCH 31.6 26.0 - 34.0 pg   MCHC 34.8 30.0 - 36.0 g/dL   RDW 86.8 88.4 - 84.4 %   Platelets 164 150 - 400 K/uL   nRBC 0.0 0.0 - 0.2 %    Comment: Performed at [facility], [address] [place], [place] [address]  Glucose, capillary     Status: Abnormal   Collection Time: 04/20/24  7:49 AM  Result Value Ref Range   Glucose-Capillary 102 (H) 70 - 99 mg/dL    Comment: Glucose reference range applies only to samples taken after fasting for at least 8 hours.   Comment 1 Notify RN    Comment 2 Document in Chart   POCT Urinalysis Dipstick (18997)     Status: Abnormal   Collection  Time: 05/02/24 10:51 AM  Result Value Ref Range   Color, UA     Clarity, UA     Glucose, UA Negative Negative   Bilirubin, UA Negative    Ketones, UA Negative    Spec Grav, UA 1.020 1.010 - 1.025   Blood, UA Negative    pH, UA 6.0 5.0 - 8.0   Protein, UA Negative Negative   Urobilinogen, UA 0.2 0.2 or 1.0 E.U./dL   Nitrite, UA Negative    Leukocytes, UA Moderate (2+) (A) Negative   Appearance     Odor    Urine Culture     Status: None   Collection Time: 05/02/24 11:14 AM   Specimen: Urine   UR  Result Value Ref Range   Urine Culture, Routine Final report    Organism ID, Bacteria Comment     Comment: Mixed urogenital flora 10,000-25,000 colony forming units per mL   CBC with Differential/Platelet     Status: Abnormal   Collection Time: 05/02/24 11:15 AM  Result Value Ref Range   WBC 6.0 3.4 - 10.8 x10E3/uL    RBC 3.93 3.77 - 5.28 x10E6/uL   Hemoglobin 12.3 11.1 - 15.9 g/dL   Hematocrit 61.6 65.9 - 46.6 %   MCV 98 (H) 79 - 97 fL   MCH 31.3 26.6 - 33.0 pg   MCHC 32.1 31.5 - 35.7 g/dL   RDW 86.8 88.2 - 84.5 %   Platelets 224 150 - 450 x10E3/uL   Neutrophils 42 Not Estab. %   Lymphs 41 Not Estab. %   Monocytes 14 Not Estab. %   Eos 2 Not Estab. %   Basos 1 Not Estab. %   Neutrophils Absolute 2.6 1.4 - 7.0 x10E3/uL   Lymphocytes Absolute 2.5 0.7 - 3.1 x10E3/uL   Monocytes Absolute 0.8 0.1 - 0.9 x10E3/uL   EOS (ABSOLUTE) 0.1 0.0 - 0.4 x10E3/uL   Basophils Absolute 0.0 0.0 - 0.2 x10E3/uL   Immature Granulocytes 0 Not Estab. %   Immature Grans (Abs) 0.0 0.0 - 0.1 x10E3/uL  CMP14+EGFR     Status: Abnormal   Collection Time: 05/02/24 11:15 AM  Result Value Ref Range   Glucose 105 (H) 70 - 99 mg/dL   BUN 16 8 - 27 mg/dL   Creatinine, Ser 8.71 (H) 0.57 - 1.00 mg/dL   eGFR 47 (L) >40 fO/fpw/8.26   BUN/Creatinine Ratio 13 12 - 28   Sodium 140 134 - 144 mmol/L   Potassium 5.1 3.5 - 5.2 mmol/L   Chloride 102 96 - 106 mmol/L   CO2 25 20 - 29 mmol/L   Calcium  9.9 8.7 - 10.3 mg/dL   Total Protein 5.8 (L) 6.0 - 8.5 g/dL   Albumin 3.8 (L) 3.9 - 4.9 g/dL   Globulin, Total 2.0 1.5 - 4.5 g/dL   Bilirubin Total 0.3 0.0 - 1.2 mg/dL   Alkaline Phosphatase 54 44 - 121 IU/L    Comment: **Effective May 13, 2024 Alkaline Phosphatase**   reference interval will be changing to:              Age                Female          Female           0 -  5 days         33 - 127       45 - 127  6 - 10 days         29 - 242       29 - 242          11 - 20 days        109 - 357      109 - 357          21 - 30 days         94 - 494       94 - 494           1 -  2 months      149 - 539      149 - 539           3 -  6 months      131 - 452      131 - 452           7 - 11 months      117 - 401      117 - 401   12 months -  6 years       158 - 369      158 - 369           7 - 12 years       150 - 409       150 - 409               13 years       156 - 435       78 - 227               14 years       114 - 375       64 - 161               15 years        88 - 279       56 - 134               16 years        74 - 207       51 - 121               17 years        63 - 161       47 - 113          18 - 20 years        51 - 125       42 - 106          21 - 50 years         47 - 123       41 - 116          51 - 80 years        49 - 135       51 - 125              >80 years        48 - 129       48 - 129    AST 14 0 - 40 IU/L   ALT 13 0 - 32 IU/L  POCT Urinalysis Dipstick (18997)     Status: Normal   Collection Time: 05/13/24  1:56 PM  Result Value Ref Range   Color, UA     Clarity, UA     Glucose, UA Negative Negative  Bilirubin, UA Negative    Ketones, UA Negative    Spec Grav, UA 1.020 1.010 - 1.025   Blood, UA Negative    pH, UA 6.0 5.0 - 8.0   Protein, UA Negative Negative   Urobilinogen, UA 0.2 0.2 or 1.0 E.U./dL   Nitrite, UA Negative    Leukocytes, UA Negative Negative   Appearance     Odor    POCT Rapid Influenza A&B     Status: Normal   Collection Time: 05/13/24  2:14 PM  Result Value Ref Range   FLU A Negative    FLU B Negative   Urine Culture     Status: None   Collection Time: 05/13/24  2:34 PM   Specimen: Urine   UR  Result Value Ref Range   Urine Culture, Routine Final report    Organism ID, Bacteria Comment     Comment: Mixed urogenital flora Less than 10,000 colonies/mL        Assessment & Plan Pain of left hip Rib pain on left side Xr left rib and hip ordered today.  Will call with results when available.   Type 2 diabetes mellitus with diabetic neuropathy, with long-term current use of insulin  (HCC) Checking labs today. Will call pt. With results  Continue current diabetes POC, as patient has been well controlled on current regimen.  Will adjust meds if needed based on labs.   -CBC w/Diff -CMP w/eGFR -Hemoglobin A1C  Vitamin D  deficiency,  unspecified B12 deficiency Checking labs today.  Will continue supplements as needed.   - Vitamin D  - Vitamin B12 - TSH  Essential hypertension Blood pressure well controlled with current medications.  Continue current therapy.  Will reassess at follow up.   - CBC w/Diff - CMP w/eGFR  Hyperlipidemia associated with type 2 diabetes mellitus (HCC) Checking labs today.  Continue current therapy for lipid control. Will modify as needed based on labwork results.   -CMP w/eGFR -Lipid Panel  Stage 3a chronic kidney disease (HCC) Patient is seen by nephrology, who manage this condition.  She is well controlled with current therapy.   Will defer to them for further changes to plan of care.     Return in about 3 months (around 08/15/2024).   Total time spent: 20 minutes  ALAN CHRISTELLA ARRANT, FNP  05/16/2024   This document may have been prepared by [facility] Voice Recognition software and as such may include unintentional dictation errors.

## 2024-05-16 NOTE — Assessment & Plan Note (Signed)
 Blood pressure well controlled with current medications.  Continue current therapy.  Will reassess at follow up.   - CBC w/Diff - CMP w/eGFR

## 2024-05-16 NOTE — Assessment & Plan Note (Signed)
Patient is seen by nephrology, who manage this condition.  She is well controlled with current therapy.   Will defer to them for further changes to plan of care.

## 2024-05-16 NOTE — Assessment & Plan Note (Signed)
 Checking labs today.  Continue current therapy for lipid control. Will modify as needed based on labwork results.   -CMP w/eGFR -Lipid Panel

## 2024-05-17 LAB — CMP14+EGFR
ALT: 10 IU/L (ref 0–32)
AST: 9 IU/L (ref 0–40)
Albumin: 3.6 g/dL — ABNORMAL LOW (ref 3.9–4.9)
Alkaline Phosphatase: 56 IU/L (ref 49–135)
BUN/Creatinine Ratio: 13 (ref 12–28)
BUN: 11 mg/dL (ref 8–27)
Bilirubin Total: 0.2 mg/dL (ref 0.0–1.2)
CO2: 23 mmol/L (ref 20–29)
Calcium: 8.9 mg/dL (ref 8.7–10.3)
Chloride: 105 mmol/L (ref 96–106)
Creatinine, Ser: 0.83 mg/dL (ref 0.57–1.00)
Globulin, Total: 1.9 g/dL (ref 1.5–4.5)
Glucose: 90 mg/dL (ref 70–99)
Potassium: 4.3 mmol/L (ref 3.5–5.2)
Sodium: 141 mmol/L (ref 134–144)
Total Protein: 5.5 g/dL — ABNORMAL LOW (ref 6.0–8.5)
eGFR: 79 mL/min/1.73 (ref >59)

## 2024-05-17 LAB — CBC WITH DIFFERENTIAL/PLATELET
Basophils Absolute: 0 x10E3/uL (ref 0.0–0.2)
Basos: 0 %
EOS (ABSOLUTE): 0.1 x10E3/uL (ref 0.0–0.4)
Eos: 1 %
Hematocrit: 33.9 % — ABNORMAL LOW (ref 34.0–46.6)
Hemoglobin: 11.2 g/dL (ref 11.1–15.9)
Immature Grans (Abs): 0 x10E3/uL (ref 0.0–0.1)
Immature Granulocytes: 0 %
Lymphocytes Absolute: 2 x10E3/uL (ref 0.7–3.1)
Lymphs: 39 %
MCH: 32.8 pg (ref 26.6–33.0)
MCHC: 33 g/dL (ref 31.5–35.7)
MCV: 99 fL — ABNORMAL HIGH (ref 79–97)
Monocytes Absolute: 0.6 x10E3/uL (ref 0.1–0.9)
Monocytes: 11 %
Neutrophils Absolute: 2.4 x10E3/uL (ref 1.4–7.0)
Neutrophils: 49 %
Platelets: 143 x10E3/uL — ABNORMAL LOW (ref 150–450)
RBC: 3.41 x10E6/uL — ABNORMAL LOW (ref 3.77–5.28)
RDW: 13.4 % (ref 11.7–15.4)
WBC: 5 x10E3/uL (ref 3.4–10.8)

## 2024-05-17 LAB — LIPID PANEL
Chol/HDL Ratio: 3.6 ratio (ref 0.0–4.4)
Cholesterol, Total: 135 mg/dL (ref 100–199)
HDL: 38 mg/dL — ABNORMAL LOW (ref >39)
LDL Chol Calc (NIH): 70 mg/dL (ref 0–99)
Triglycerides: 156 mg/dL — ABNORMAL HIGH (ref 0–149)
VLDL Cholesterol Cal: 27 mg/dL (ref 5–40)

## 2024-05-17 LAB — VITAMIN D 25 HYDROXY (VIT D DEFICIENCY, FRACTURES): Vit D, 25-Hydroxy: 50.9 ng/mL (ref 30.0–100.0)

## 2024-05-17 LAB — HEMOGLOBIN A1C
Est. average glucose Bld gHb Est-mCnc: 114 mg/dL
Hgb A1c MFr Bld: 5.6 % (ref 4.8–5.6)

## 2024-05-17 LAB — VITAMIN B12: Vitamin B-12: 978 pg/mL (ref 232–1245)

## 2024-05-17 LAB — TSH: TSH: 1.79 u[IU]/mL (ref 0.450–4.500)

## 2024-05-18 ENCOUNTER — Other Ambulatory Visit: Payer: Self-pay | Admitting: Cardiology

## 2024-05-23 ENCOUNTER — Ambulatory Visit: Payer: Self-pay

## 2024-06-26 ENCOUNTER — Ambulatory Visit (INDEPENDENT_AMBULATORY_CARE_PROVIDER_SITE_OTHER)

## 2024-06-26 VITALS — HR 96 | Ht 63.0 in | Wt 141.0 lb

## 2024-06-26 DIAGNOSIS — R0602 Shortness of breath: Secondary | ICD-10-CM | POA: Diagnosis not present

## 2024-06-26 DIAGNOSIS — D869 Sarcoidosis, unspecified: Secondary | ICD-10-CM

## 2024-06-26 LAB — PULMONARY FUNCTION TEST
DL/VA % pred: 98 %
DL/VA: 4.17 ml/min/mmHg/L
DLCO unc % pred: 93 %
DLCO unc: 17.89 ml/min/mmHg
FEF 25-75 Post: 2.18 L/s
FEF 25-75 Pre: 2.66 L/s
FEF2575-%Change-Post: -18 %
FEF2575-%Pred-Post: 100 %
FEF2575-%Pred-Pre: 123 %
FEV1-%Change-Post: -2 %
FEV1-%Pred-Post: 94 %
FEV1-%Pred-Pre: 97 %
FEV1-Post: 2.25 L
FEV1-Pre: 2.32 L
FEV1FVC-%Change-Post: -1 %
FEV1FVC-%Pred-Pre: 107 %
FEV6-%Change-Post: 1 %
FEV6-%Pred-Post: 92 %
FEV6-%Pred-Pre: 91 %
FEV6-Post: 2.76 L
FEV6-Pre: 2.73 L
FEV6FVC-%Pred-Post: 103 %
FEV6FVC-%Pred-Pre: 103 %
FVC-%Change-Post: -1 %
FVC-%Pred-Post: 89 %
FVC-%Pred-Pre: 90 %
FVC-Post: 2.76 L
FVC-Pre: 2.79 L
Post FEV1/FVC ratio: 82 %
Post FEV6/FVC ratio: 100 %
Pre FEV1/FVC ratio: 83 %
Pre FEV6/FVC Ratio: 100 %
RV % pred: 78 %
RV: 1.57 L
TLC % pred: 96 %
TLC: 4.75 L

## 2024-06-26 NOTE — Patient Instructions (Signed)
 Full PFT completed today ? ?

## 2024-06-26 NOTE — Progress Notes (Signed)
 Full PFT completed today ? ?

## 2024-07-08 ENCOUNTER — Ambulatory Visit: Admitting: Pulmonary Disease

## 2024-07-08 ENCOUNTER — Encounter: Payer: Self-pay | Admitting: Pulmonary Disease

## 2024-07-08 VITALS — BP 110/78 | HR 87 | Temp 97.9°F | Ht 63.0 in | Wt 139.0 lb

## 2024-07-08 DIAGNOSIS — Z23 Encounter for immunization: Secondary | ICD-10-CM | POA: Diagnosis not present

## 2024-07-08 DIAGNOSIS — N1831 Chronic kidney disease, stage 3a: Secondary | ICD-10-CM | POA: Diagnosis not present

## 2024-07-08 DIAGNOSIS — D869 Sarcoidosis, unspecified: Secondary | ICD-10-CM

## 2024-07-08 DIAGNOSIS — Z794 Long term (current) use of insulin: Secondary | ICD-10-CM

## 2024-07-08 DIAGNOSIS — E1140 Type 2 diabetes mellitus with diabetic neuropathy, unspecified: Secondary | ICD-10-CM

## 2024-07-08 NOTE — Progress Notes (Signed)
 Subjective:    Patient ID: Renee Bush, female    DOB: 1961/03/22, 63 y.o.   MRN: [id number]  Patient Care Team: Orlean Alan HERO, FNP as PCP - General (Family Medicine) End, Lonni, MD as PCP - Cardiology (Cardiology) Tamea Dedra CROME, MD as Consulting Physician (Pulmonary Disease)  Chief Complaint  Patient presents with   Sarcoidosis    BACKGROUND/INTERVAL:Renee Bush is a 63 year old remote former smoker (minimal smoking exposure) who presents for follow-up on the issue of sarcoidosis. This is a scheduled visit.  She was last seen here on 27 March 2024.  Recall that methotrexate  was started on 01 June 2022 due to evidence of increased retroperitoneal and porta hepatic lymph node involvement as well as hypercalcemia. The patient was initially seen by me on 12 October 2021 for abnormal chest CT with mediastinal and hilar adenopathy.  These findings were hypermetabolic on PET/CT.  A lymph node biopsy of the right supraclavicular area ensued and it was consistent with sarcoidosis. Initially she was treated with low-dose steroids however, she has significant diabetes and has had issues with diabetic ketoacidosis so steroids were avoided.  She took 7.5 mg of methotrexate  once a week on Sundays, as noted this was started on 01 June 2022.  She tolerated the medication well.  She was started at the lower dose due to mild AKI noted. Repeat basic metabolic panel performed by primary care on 19 April 2023 shows that her renal function on this medication is stable.  Of note, the patient also had hypercalcemia that resolved on the methotrexate .  After her visit of October 2024 she was able to come off of methotrexate  as a CT chest abdomen and pelvis showed no evidence of active sarcoid.  Today she presents for follow-up.  Last hospitalization was on 18 April 2024 for unrelated issue to sarcoidosis.  CT abdomen and pelvis with contrast at that time showed no evidence of  adenopathy.  HPI Discussed the use of AI scribe software for clinical note transcription with the patient, who gave verbal consent to proceed.  History of Present Illness   Renee Bush is a 63 year old female with sarcoidosis who presents for follow-up after hospitalization in August.  In August, she was hospitalized due to severe symptoms including vomiting and an inability to keep food down. The etiology of these symptoms was not linked to her sarcoidosis treatment or any recent vaccinations, and a viral infection and/or UTI were considered as a possible cause.  Her lung function tests were normal, and recent imaging did not reveal any concerning lymphadenopathy.  She has not had any issues with cough, sputum production, shortness of breath, orthopnea or paroxysmal nocturnal dyspnea.  No lower extremity edema.  No chest pain.  Overall she feels well and looks well.  She expressed interest in receiving a flu shot during this visit.     TEST/EVENTS  October 04, 2021 Right supraclavicular lymph node biopsy showed nonnecrotizing granulomatous inflammation  September 22, 2021 CT chest negative for PE, mediastinal and right hilar adenopathy small nodes are present at the porta hepatis,  September 29, 2021 PET scan showed hypermetabolic lymphadenopathy in both hilar regions in the mediastinal and associated hypermetabolic lymph nodes in the right supraclavicular region and left thoracic inlet upper portal lymph nodes in the abdomen show low-level hypermetabolism October 29, 2021 2D echo showed EF at 60-65%, grade 1 diastolic dysfunction, normal pulmonary artery systolic pressure, normal right ventricular size.  October 28, 2021 PFTs showed normal lung  function with mild bronchodilator response FEV1 101%, ratio 82, FVC 95%, 10% bronchodilator change, DLCO 104%. February 08, 2022 CT chest: Prominent/enlarged thoracic lymph nodes creased in size from prior exam there is porta hepatic lymph node within upper  abdomen which has increased in size from prior exams findings are nonspecific and may be related to the history of sarcoid.  4 mm left solid pulmonary nodule. 13 April 2022 CT abdomen and pelvis: Increased size of the retroperitoneal and porta hepatic lymph nodes of the upper abdomen. 01 June 2022 ACE level:133 U/L (Ref.14-82 U/L) 01 June 2022: Methotrexate  initiated, 7.5 mg weekly 30 August 2022 ACE level: 84 U/L 25 November 2022 ACE level: 79 U/L 05 Jan 2023 ACE level: 80 U/L 12 June 2023 CT chest abdomen and pelvis with contrast: No CT evidence of active sarcoidosis in the chest abdomen and pelvis.  Decreased size of the thoracic abdominal and pelvic lymph nodes.  Solid left lower lobe pulmonary nodule measures 3 mm previously 4 mm, favored benign.  Pleural-parenchymal scarring in the peripheral right middle lobe lingula and paramedian right lower lobe. 07 February 2024 echocardiogram: LVEF 60 to 65%, normal LV function.  No diastolic dysfunction.  RV function normal.  Left atrium mildly dilated.  No valvular abnormalities.  Review of Systems A 10 point review of systems was performed and it is as noted above otherwise negative.   Patient Active Problem List   Diagnosis Date Noted   UTI (urinary tract infection) 04/19/2024   Intractable nausea and vomiting 04/19/2024   Mixed stress and urge urinary incontinence 01/14/2024   Vitamin D  deficiency, unspecified 11/12/2023   Coronary artery disease involving native coronary artery of native heart without angina pectoris 03/01/2023   Hyperkalemia 03/01/2023   Iron deficiency anemia 10/10/2022   Moderate episode of recurrent major depressive disorder (HCC) 10/10/2022   Sarcoidosis 11/12/2021   PSVT (paroxysmal supraventricular tachycardia) 10/28/2021   Acute non-recurrent pansinusitis 08/15/2019   Bilateral edema of lower extremity 05/16/2019   Generalized anxiety disorder 02/07/2019   Flank pain 11/09/2017   Insomnia 10/06/2017   GERD  (gastroesophageal reflux disease) 10/06/2017   Chronic kidney disease, stage III (moderate) (HCC) 10/06/2017   B12 deficiency 06/09/2016   Hyperlipidemia associated with type 2 diabetes mellitus (HCC) 05/05/2016   Type 2 diabetes mellitus with diabetic neuropathy, with long-term current use of insulin  (HCC) 09/10/2015   Essential hypertension 09/10/2015    Social History   Tobacco Use   Smoking status: Former    Current packs/day: 0.00    Average packs/day: 1 pack/day for 5.0 years (5.0 ttl pk-yrs)    Types: Cigarettes    Start date: 04/17/2000    Quit date: 04/17/2005    Years since quitting: 19.2   Smokeless tobacco: Never  Substance Use Topics   Alcohol use: Not Currently    Alcohol/week: 14.0 standard drinks of alcohol    Types: 14 Cans of beer per week    Comment: quit ~2020    Allergies  Allergen Reactions   Etodolac Rash   Penicillin G Rash   Penicillins Rash    Has patient had a PCN reaction causing immediate rash, facial/tongue/throat swelling, SOB or lightheadedness with hypotension: Yes  Has patient had a PCN reaction causing severe rash involving mucus membranes or skin necrosis: No  Has patient had a PCN reaction that required hospitalization: No  Has patient had a PCN reaction occurring within the last 10 years: No  If all of the above answers are NO, then  may proceed with Cephalosporin use.  Has patient had a PCN reaction causing immediate rash, facial/tongue/throat swelling, SOB or lightheadedness with hypotension: Yes Has patient had a PCN reaction causing severe rash involving mucus membranes or skin necrosis: No Has patient had a PCN reaction that required hospitalization: No Has patient had a PCN reaction occurring within the last 10 years: No If all of the above answers are NO, then may proceed with Cephalosporin use.    Current Meds  Medication Sig   ACCRUFER  30 MG CAPS TAKE 1 CAPSULE(30 MG) BY MOUTH TWICE DAILY   Acetaminophen  500 MG capsule  Take by mouth every 6 (six) hours as needed.   albuterol  (PROVENTIL  HFA) 108 (90 Base) MCG/ACT inhaler Inhale 2 puffs into the lungs every 6 (six) hours as needed for wheezing or shortness of breath.   Blood Glucose Monitoring Suppl (ONETOUCH VERIO) w/Device KIT Use to check glucose up to twice daily   Continuous Glucose Receiver (DEXCOM G6 RECEIVER) DEVI 1 Device by Does not apply route daily.   Continuous Glucose Transmitter (DEXCOM G6 TRANSMITTER) MISC 1 Device by Does not apply route daily.   cyanocobalamin  (VITAMIN B12) 1000 MCG tablet Take 1,000 mcg by mouth daily.   DULoxetine  (CYMBALTA ) 60 MG capsule Take 1 capsule (60 mg total) by mouth 2 (two) times daily.   fexofenadine  (ALLEGRA ) 180 MG tablet Take 1 tablet (180 mg total) by mouth daily.   Finerenone  (KERENDIA ) 10 MG TABS Take 1 tablet (10 mg total) by mouth daily.   gabapentin  (NEURONTIN ) 400 MG capsule TAKE 1 CAPSULE BY MOUTH TWICE DAILY AND 2 CAPSULES EVERY NIGHT AT BEDTIME AS DIRECTED   insulin  glargine (LANTUS  SOLOSTAR) 100 UNIT/ML Solostar Pen Inject 17 Units into the skin at bedtime.   insulin  lispro (HUMALOG ) 100 UNIT/ML injection INJECT 2 UNITS UNDER THE SKIN THREE TIMES DAILY BEFORE MEALS SLIDING SCALE MAX 30 UNITS PER DAY DISCARD VIAL AFTER 28 DAYS ONCE PUNCTURED   Insulin  Pen Needle 32G X 6 MM MISC Use as directed.   Insulin  Syringe-Needle U-100 (INSULIN  SYRINGE .5CC/31GX5/16) 31G X 5/16 0.5 ML MISC 1 each by Does not apply route 3 (three) times daily.   Magnesium  Oxide 400 MG CAPS Take 1 capsule (400 mg total) by mouth daily in the afternoon.   metFORMIN  (GLUCOPHAGE ) 500 MG tablet TAKE 1 TABLET(500 MG) BY MOUTH TWICE DAILY   metoCLOPramide  (REGLAN ) 10 MG tablet Take 1 tablet (10 mg total) by mouth 3 (three) times daily with meals.   metoprolol  tartrate (LOPRESSOR ) 25 MG tablet Take 2 tablets (50 mg total) by mouth 2 (two) times daily.   montelukast  (SINGULAIR ) 10 MG tablet Take 1 tablet (10 mg total) by mouth daily.    nitroGLYCERIN  (NITROSTAT ) 0.4 MG SL tablet DISSOLVE 1 TABLET UNDER TONGUE AS NEEDED FOR CHEST PAIN EVERY 5 MINUTES FOR MAX OF 3 DOSES IN 15 MINUTES. IF NO RELIEF AFTER FIRST DOSE CALL 911.   Omega-3 Fatty Acids (FISH OIL) 1000 MG CAPS Take 1,000 mg by mouth 2 (two) times daily.   ondansetron  (ZOFRAN -ODT) 4 MG disintegrating tablet Take 1 tablet (4 mg total) by mouth every 8 (eight) hours as needed for nausea or vomiting.   pantoprazole  (PROTONIX ) 20 MG tablet Take 1 tablet (20 mg total) by mouth daily.   rosuvastatin  (CRESTOR ) 5 MG tablet TAKE 1 TABLET BY MOUTH DAILY   SYMBICORT  160-4.5 MCG/ACT inhaler Inhale 2 puffs into the lungs 2 (two) times daily. Rinse your mouth well after use.   tirzepatide  (MOUNJARO )  10 MG/0.5ML Pen Inject 10 mg into the skin once a week.   traZODone  (DESYREL ) 50 MG tablet Take 25-50 mg by mouth at bedtime.   XIIDRA 5 % SOLN Apply 1 drop to eye 2 (two) times daily.    Immunization History  Administered Date(s) Administered   Influenza, Seasonal, Injecte, Preservative Fre 05/30/2023   Influenza,inj,Quad PF,6+ Mos 05/15/2019, 06/01/2022   Influenza,trivalent, recombinat, inj, PF 07/08/2024   Influenza-Unspecified 06/04/2015, 06/10/2016, 06/08/2017, 05/25/2018, 06/01/2020, 05/20/2021   PFIZER(Purple Top)SARS-COV-2 Vaccination 12/02/2019, 12/23/2019   Pneumococcal Polysaccharide-23 05/16/2019   Tdap 06/04/2015, 05/05/2023        Objective:     BP 110/78   Pulse 87   Temp 97.9 F (36.6 C) (Temporal)   Ht 5' 3 (1.6 m)   Wt 139 lb (63 kg)   SpO2 98%   BMI 24.62 kg/m   SpO2: 98 %  GENERAL: Well-nourished, well-developed woman, no acute distress.  Fully ambulatory, no conversational dyspnea. HEAD: Normocephalic, atraumatic. EYES: Pupils equal, round, reactive to light.  No scleral icterus. MOUTH: Poor dentition, oral mucosa moist.  No thrush. NECK: Supple. No thyromegaly. Trachea midline. No JVD.  No adenopathy. PULMONARY: Good air entry bilaterally.  No  adventitious sounds. CARDIOVASCULAR: S1 and S2. Regular rate and rhythm.  No rubs, murmurs or gallops heard. ABDOMEN: Benign. MUSCULOSKELETAL: No joint deformity, no clubbing, no edema.  NEUROLOGIC: No overt focal deficit, no gait disturbance, speech is fluent. SKIN: Intact,warm,dry.  PSYCH: Mood and behavior normal.    Assessment & Plan:     ICD-10-CM   1. Sarcoidosis  D86.9     2. Stage 3a chronic kidney disease (HCC)  N18.31     3. Type 2 diabetes mellitus with diabetic neuropathy, with long-term current use of insulin  (HCC)  E11.40    Z79.4     4. Need for influenza vaccination  Z23 Flu vaccine, recombinant, trivalent, inj      Orders Placed This Encounter  Procedures   Flu vaccine, recombinant, trivalent, inj   Discussion:    Sarcoidosis There is to be in remission with normal lung function and no concerning lymph nodes on recent scans. No current symptoms warranting further investigation. - Scheduled follow-up in six months. - Administered flu shot today. - Will consider CT scan if symptoms develop.     Advised if symptoms do not improve or worsen, to please contact office for sooner follow up or seek emergency care.    I spent 30 minutes of dedicated to the care of this patient on the date of this encounter to include pre-visit review of records, face-to-face time with the patient discussing conditions above, post visit ordering of testing, clinical documentation with the electronic health record, making appropriate referrals as documented, and communicating necessary findings to members of the patients care team.     C. Leita Sanders, MD Advanced Bronchoscopy PCCM [facility]    *This note was generated using voice recognition software/Dragon and/or AI transcription program.  Despite best efforts to proofread, errors can occur which can change the meaning. Any transcriptional errors that result from this process are unintentional and may not  be fully corrected at the time of dictation.

## 2024-07-08 NOTE — Patient Instructions (Signed)
 VISIT SUMMARY:  You had a follow-up appointment today to check on your sarcoidosis after your hospitalization in August. During your hospitalization, you experienced severe vomiting and an inability to keep food down, but these symptoms were not related to your sarcoidosis or any recent vaccinations. A viral infection was considered as a possible cause. Your lung function tests were normal, and recent imaging did not show any concerning lymph nodes.  YOUR PLAN:  -SARCOIDOSIS: Sarcoidosis is a condition where clusters of inflammatory cells form in various organs, most commonly the lungs. Your sarcoidosis is well controlled, with normal lung function and no concerning lymph nodes on recent scans. No current symptoms require further investigation at this time. We will follow up in six months, and you received a flu shot today. If any symptoms develop, we will consider a CT scan.  INSTRUCTIONS:  Please schedule a follow-up appointment in six months. If you develop any new symptoms, contact our office immediately.

## 2024-07-24 ENCOUNTER — Other Ambulatory Visit: Payer: Self-pay | Admitting: Family

## 2024-08-15 ENCOUNTER — Encounter: Payer: Self-pay | Admitting: Family

## 2024-08-15 ENCOUNTER — Ambulatory Visit: Admitting: Family

## 2024-08-15 DIAGNOSIS — N1831 Chronic kidney disease, stage 3a: Secondary | ICD-10-CM

## 2024-08-15 DIAGNOSIS — E538 Deficiency of other specified B group vitamins: Secondary | ICD-10-CM

## 2024-08-15 DIAGNOSIS — I10 Essential (primary) hypertension: Secondary | ICD-10-CM

## 2024-08-15 DIAGNOSIS — E1140 Type 2 diabetes mellitus with diabetic neuropathy, unspecified: Secondary | ICD-10-CM

## 2024-08-15 DIAGNOSIS — E1169 Type 2 diabetes mellitus with other specified complication: Secondary | ICD-10-CM

## 2024-08-15 DIAGNOSIS — E559 Vitamin D deficiency, unspecified: Secondary | ICD-10-CM

## 2024-08-15 DIAGNOSIS — E7800 Pure hypercholesterolemia, unspecified: Secondary | ICD-10-CM

## 2024-08-15 MED ORDER — PANTOPRAZOLE SODIUM 20 MG PO TBEC: 20.0000 mg | DELAYED_RELEASE_TABLET | Freq: Every day | ORAL | 0 refills | Status: DC

## 2024-08-15 MED ORDER — GABAPENTIN 400 MG PO CAPS: ORAL_CAPSULE | ORAL | 0 refills | Status: DC

## 2024-08-15 MED ORDER — MONTELUKAST SODIUM 10 MG PO TABS: 10.0000 mg | ORAL_TABLET | Freq: Every day | ORAL | 3 refills | Status: AC

## 2024-08-15 MED ORDER — ROSUVASTATIN CALCIUM 5 MG PO TABS: 5.0000 mg | ORAL_TABLET | Freq: Every day | ORAL | 2 refills | Status: AC

## 2024-08-15 MED ORDER — METOPROLOL TARTRATE 25 MG PO TABS: 50.0000 mg | ORAL_TABLET | Freq: Two times a day (BID) | ORAL | 1 refills | Status: AC

## 2024-08-15 MED ORDER — TRAZODONE HCL 50 MG PO TABS: 25.0000 mg | ORAL_TABLET | Freq: Every day | ORAL | 2 refills | Status: AC

## 2024-08-15 MED ORDER — LANTUS SOLOSTAR 100 UNIT/ML ~~LOC~~ SOPN: 17.0000 [IU] | PEN_INJECTOR | Freq: Every day | SUBCUTANEOUS | 3 refills | Status: AC

## 2024-08-15 MED ORDER — DULOXETINE HCL 60 MG PO CPEP: 60.0000 mg | ORAL_CAPSULE | Freq: Two times a day (BID) | ORAL | 3 refills | Status: AC

## 2024-08-15 MED ORDER — METFORMIN HCL 500 MG PO TABS: 500.0000 mg | ORAL_TABLET | Freq: Two times a day (BID) | ORAL | 2 refills | Status: AC

## 2024-08-15 MED ORDER — FEXOFENADINE HCL 180 MG PO TABS: 180.0000 mg | ORAL_TABLET | Freq: Every day | ORAL | 1 refills | Status: AC

## 2024-08-16 LAB — CMP14+EGFR
ALT: 12 IU/L (ref 0–32)
AST: 14 IU/L (ref 0–40)
Albumin: 4.4 g/dL (ref 3.9–4.9)
Alkaline Phosphatase: 60 IU/L (ref 49–135)
BUN/Creatinine Ratio: 16 (ref 12–28)
BUN: 15 mg/dL (ref 8–27)
Bilirubin Total: 0.4 mg/dL (ref 0.0–1.2)
CO2: 20 mmol/L (ref 20–29)
Calcium: 9.9 mg/dL (ref 8.7–10.3)
Chloride: 100 mmol/L (ref 96–106)
Creatinine, Ser: 0.93 mg/dL (ref 0.57–1.00)
Globulin, Total: 2 g/dL (ref 1.5–4.5)
Glucose: 146 mg/dL — ABNORMAL HIGH (ref 70–99)
Potassium: 3.8 mmol/L (ref 3.5–5.2)
Sodium: 140 mmol/L (ref 134–144)
Total Protein: 6.4 g/dL (ref 6.0–8.5)
eGFR: 69 mL/min/1.73

## 2024-08-16 LAB — HEMOGLOBIN A1C
Est. average glucose Bld gHb Est-mCnc: 120 mg/dL
Hgb A1c MFr Bld: 5.8 % — ABNORMAL HIGH (ref 4.8–5.6)

## 2024-08-16 LAB — CBC WITH DIFFERENTIAL/PLATELET
Basophils Absolute: 0 x10E3/uL (ref 0.0–0.2)
Basos: 0 %
EOS (ABSOLUTE): 0.1 x10E3/uL (ref 0.0–0.4)
Eos: 1 %
Hematocrit: 41.9 % (ref 34.0–46.6)
Hemoglobin: 13.8 g/dL (ref 11.1–15.9)
Immature Grans (Abs): 0 x10E3/uL (ref 0.0–0.1)
Immature Granulocytes: 0 %
Lymphocytes Absolute: 2 x10E3/uL (ref 0.7–3.1)
Lymphs: 17 %
MCH: 31.8 pg (ref 26.6–33.0)
MCHC: 32.9 g/dL (ref 31.5–35.7)
MCV: 97 fL (ref 79–97)
Monocytes Absolute: 0.7 x10E3/uL (ref 0.1–0.9)
Monocytes: 6 %
Neutrophils Absolute: 9 x10E3/uL — ABNORMAL HIGH (ref 1.4–7.0)
Neutrophils: 76 %
Platelets: 224 x10E3/uL (ref 150–450)
RBC: 4.34 x10E6/uL (ref 3.77–5.28)
RDW: 13.8 % (ref 11.7–15.4)
WBC: 11.9 x10E3/uL — ABNORMAL HIGH (ref 3.4–10.8)

## 2024-08-16 LAB — TSH: TSH: 1.69 u[IU]/mL (ref 0.450–4.500)

## 2024-08-16 LAB — LIPID PANEL
Chol/HDL Ratio: 3.9 ratio (ref 0.0–4.4)
Cholesterol, Total: 170 mg/dL (ref 100–199)
HDL: 44 mg/dL
LDL Chol Calc (NIH): 97 mg/dL (ref 0–99)
Triglycerides: 164 mg/dL — ABNORMAL HIGH (ref 0–149)
VLDL Cholesterol Cal: 29 mg/dL (ref 5–40)

## 2024-08-16 LAB — VITAMIN B12: Vitamin B-12: 1194 pg/mL (ref 232–1245)

## 2024-08-16 LAB — VITAMIN D 25 HYDROXY (VIT D DEFICIENCY, FRACTURES): Vit D, 25-Hydroxy: 70.8 ng/mL (ref 30.0–100.0)

## 2024-08-21 ENCOUNTER — Ambulatory Visit: Payer: Self-pay

## 2024-09-03 ENCOUNTER — Other Ambulatory Visit: Payer: Self-pay | Admitting: Family

## 2024-09-10 ENCOUNTER — Other Ambulatory Visit: Payer: Self-pay | Admitting: Family

## 2024-09-13 ENCOUNTER — Other Ambulatory Visit: Payer: Self-pay | Admitting: Family

## 2024-11-13 ENCOUNTER — Ambulatory Visit: Admitting: Family
